# Patient Record
Sex: Male | Born: 1945
Health system: Southern US, Community
[De-identification: ages and names within clinical notes are randomized; demographics above are authoritative.]

## PROBLEM LIST (undated history)

## (undated) DIAGNOSIS — E559 Vitamin D deficiency, unspecified: Secondary | ICD-10-CM

## (undated) DIAGNOSIS — I152 Hypertension secondary to endocrine disorders: Secondary | ICD-10-CM

## (undated) DIAGNOSIS — N529 Male erectile dysfunction, unspecified: Secondary | ICD-10-CM

## (undated) DIAGNOSIS — E1159 Type 2 diabetes mellitus with other circulatory complications: Secondary | ICD-10-CM

## (undated) DIAGNOSIS — E1169 Type 2 diabetes mellitus with other specified complication: Secondary | ICD-10-CM

## (undated) DIAGNOSIS — G3109 Other frontotemporal dementia: Secondary | ICD-10-CM

## (undated) DIAGNOSIS — I10 Essential (primary) hypertension: Secondary | ICD-10-CM

## (undated) DIAGNOSIS — C801 Malignant (primary) neoplasm, unspecified: Secondary | ICD-10-CM

## (undated) DIAGNOSIS — C679 Malignant neoplasm of bladder, unspecified: Secondary | ICD-10-CM

## (undated) DIAGNOSIS — E876 Hypokalemia: Secondary | ICD-10-CM

## (undated) DIAGNOSIS — E119 Type 2 diabetes mellitus without complications: Secondary | ICD-10-CM

## (undated) DIAGNOSIS — E785 Hyperlipidemia, unspecified: Secondary | ICD-10-CM

## (undated) DIAGNOSIS — F0281 Dementia in other diseases classified elsewhere with behavioral disturbance: Secondary | ICD-10-CM

## (undated) HISTORY — DX: Other frontotemporal dementia: G31.09

## (undated) HISTORY — DX: Type 2 diabetes mellitus without complications: E11.9

## (undated) HISTORY — DX: Malignant neoplasm of bladder, unspecified: C67.9

## (undated) HISTORY — DX: Malignant (primary) neoplasm, unspecified: C80.1

## (undated) HISTORY — DX: Hypertension secondary to endocrine disorders: I15.2

## (undated) HISTORY — DX: Dementia in other diseases classified elsewhere with behavioral disturbance: F02.81

## (undated) HISTORY — DX: Male erectile dysfunction, unspecified: N52.9

## (undated) HISTORY — DX: Hypomagnesemia: E83.42

## (undated) HISTORY — DX: Hyperlipidemia, unspecified: E78.5

## (undated) HISTORY — DX: Type 2 diabetes mellitus with other specified complication: E11.69

## (undated) HISTORY — DX: Essential (primary) hypertension: I10

## (undated) HISTORY — DX: Hypokalemia: E87.6

## (undated) HISTORY — DX: Vitamin D deficiency, unspecified: E55.9

## (undated) HISTORY — DX: Type 2 diabetes mellitus with other circulatory complications: E11.59

## (undated) HISTORY — PX: COLONOSCOPY W/ POLYPECTOMY: SHX1380

---

## 2011-03-12 DIAGNOSIS — I10 Essential (primary) hypertension: Secondary | ICD-10-CM

## 2011-03-12 DIAGNOSIS — E119 Type 2 diabetes mellitus without complications: Secondary | ICD-10-CM

## 2011-03-12 DIAGNOSIS — E785 Hyperlipidemia, unspecified: Secondary | ICD-10-CM | POA: Insufficient documentation

## 2012-02-13 DEATH — deceased

## 2013-03-28 ENCOUNTER — Other Ambulatory Visit: Payer: Self-pay | Admitting: Physician Assistant

## 2013-04-05 ENCOUNTER — Other Ambulatory Visit: Payer: Self-pay

## 2013-04-05 MED ORDER — CLONIDINE HCL 0.1 MG PO TABS: 0.1000 mg | ORAL_TABLET | Freq: Every day | ORAL | Status: DC

## 2013-04-05 MED ORDER — GLIPIZIDE 10 MG PO TABS: 10.0000 mg | ORAL_TABLET | Freq: Every day | ORAL | Status: DC

## 2013-04-08 ENCOUNTER — Ambulatory Visit: Payer: Self-pay | Admitting: Family Medicine

## 2013-04-26 ENCOUNTER — Telehealth: Payer: Self-pay | Admitting: Family Medicine

## 2013-04-26 DIAGNOSIS — E119 Type 2 diabetes mellitus without complications: Secondary | ICD-10-CM

## 2013-04-26 MED ORDER — SAXAGLIPTIN HCL 2.5 MG PO TABS: 5.0000 mg | ORAL_TABLET | Freq: Every day | ORAL | Status: DC

## 2013-04-26 NOTE — Telephone Encounter (Signed)
Pt aware samples at front desk. onglyza 5mg  samples given. Pt aware to take 2.5mg 

## 2013-05-05 ENCOUNTER — Other Ambulatory Visit: Payer: Self-pay | Admitting: Nurse Practitioner

## 2013-05-05 ENCOUNTER — Other Ambulatory Visit: Payer: Self-pay | Admitting: Physician Assistant

## 2013-05-11 ENCOUNTER — Telehealth: Payer: Self-pay

## 2013-05-11 NOTE — Telephone Encounter (Signed)
Carvel Huskins called from [employer] [phone] reporting that she went to pt house and his urine screen showed 4+glucose. Dr Modesto Charon aware  APPT MADE FOR June 3 AT 2;20

## 2013-05-12 ENCOUNTER — Ambulatory Visit: Payer: Self-pay | Admitting: Family Medicine

## 2013-05-17 ENCOUNTER — Encounter: Payer: Self-pay | Admitting: Family Medicine

## 2013-05-17 ENCOUNTER — Ambulatory Visit (INDEPENDENT_AMBULATORY_CARE_PROVIDER_SITE_OTHER): Payer: Medicare Other | Admitting: Family Medicine

## 2013-05-17 VITALS — BP 147/93 | HR 93 | Temp 97.9°F | Wt 169.0 lb

## 2013-05-17 DIAGNOSIS — E785 Hyperlipidemia, unspecified: Secondary | ICD-10-CM

## 2013-05-17 DIAGNOSIS — E663 Overweight: Secondary | ICD-10-CM | POA: Insufficient documentation

## 2013-05-17 DIAGNOSIS — I10 Essential (primary) hypertension: Secondary | ICD-10-CM

## 2013-05-17 DIAGNOSIS — E1169 Type 2 diabetes mellitus with other specified complication: Secondary | ICD-10-CM | POA: Insufficient documentation

## 2013-05-17 DIAGNOSIS — Z87440 Personal history of urinary (tract) infections: Secondary | ICD-10-CM | POA: Insufficient documentation

## 2013-05-17 DIAGNOSIS — E559 Vitamin D deficiency, unspecified: Secondary | ICD-10-CM | POA: Insufficient documentation

## 2013-05-17 DIAGNOSIS — E119 Type 2 diabetes mellitus without complications: Secondary | ICD-10-CM

## 2013-05-17 DIAGNOSIS — Z125 Encounter for screening for malignant neoplasm of prostate: Secondary | ICD-10-CM | POA: Insufficient documentation

## 2013-05-17 HISTORY — DX: Type 2 diabetes mellitus with other specified complication: E11.69

## 2013-05-17 HISTORY — DX: Vitamin D deficiency, unspecified: E55.9

## 2013-05-17 LAB — POCT GLYCOSYLATED HEMOGLOBIN (HGB A1C): Hemoglobin A1C: 6.7

## 2013-05-17 LAB — POCT URINALYSIS DIPSTICK
Bilirubin, UA: NEGATIVE
Glucose, UA: NEGATIVE
Ketones, UA: NEGATIVE
Leukocytes, UA: NEGATIVE
Nitrite, UA: NEGATIVE
Spec Grav, UA: 1.015
Urobilinogen, UA: NEGATIVE
pH, UA: 5

## 2013-05-17 LAB — POCT UA - MICROSCOPIC ONLY
Crystals, Ur, HPF, POC: NEGATIVE
Yeast, UA: NEGATIVE

## 2013-05-17 LAB — POCT UA - MICROALBUMIN: Microalbumin Ur, POC: POSITIVE mg/dL

## 2013-05-17 MED ORDER — HYDROCHLOROTHIAZIDE 25 MG PO TABS: 25.0000 mg | ORAL_TABLET | Freq: Every day | ORAL | Status: DC

## 2013-05-17 NOTE — Patient Instructions (Addendum)
Diabetes and Foot Care Diabetes may cause you to have a poor blood supply (circulation) to your legs and feet. Because of this, the skin may be thinner, break easier, and heal more slowly. You also may have nerve damage in your legs and feet causing decreased feeling. You may not notice minor injuries to your feet that could lead to serious problems or infections. Taking care of your feet is one of the most important things you can do for yourself.  HOME CARE INSTRUCTIONS  Do not go barefoot. Bare feet are easily injured.  Check your feet daily for blisters, cuts, and redness.  Wash your feet with warm water (not hot) and mild soap. Pat your feet and between your toes until completely dry.  Apply a moisturizing lotion that does not contain alcohol or petroleum jelly to the dry skin on your feet and to dry brittle toenails. Do not put it between your toes.  Trim your toenails straight across. Do not dig under them or around the cuticle.  Do not cut corns or calluses, or try to remove them with medicine.  Wear clean cotton socks or stockings every day. Make sure they are not too tight. Do not wear knee high stockings since they may decrease blood flow to your legs.  Wear leather shoes that fit properly and have enough cushioning. To break in new shoes, wear them just a few hours a day to avoid injuring your feet.  Wear shoes at all times, even in the house.  Do not cross your legs. This may decrease the blood flow to your feet.  If you find a minor scrape, cut, or break in the skin on your feet, keep it and the skin around it clean and dry. These areas may be cleansed with mild soap and water. Do not use peroxide, alcohol, iodine or Merthiolate.  When you remove an adhesive bandage, be sure not to harm the skin around it.  If you have a wound, look at it several times a day to make sure it is healing.  Do not use heating pads or hot water bottles. Burns can occur. If you have lost feeling  in your feet or legs, you may not know it is happening until it is too late.  Report any cuts, sores or bruises to your caregiver. Do not wait! SEEK MEDICAL CARE IF:   You have an injury that is not healing or you notice redness, numbness, burning, or tingling.  Your feet always feel cold.  You have pain or cramps in your legs and feet. SEEK IMMEDIATE MEDICAL CARE IF:   There is increasing redness, swelling, or increasing pain in the wound.  There is a red line that goes up your leg.  Pus is coming from a wound.  You develop an unexplained oral temperature above 102 F (38.9 C), or as your caregiver suggests.  You notice a bad smell coming from an ulcer or wound. MAKE SURE YOU:   Understand these instructions.  Will watch your condition.  Will get help right away if you are not doing well or get worse. Document Released: 11/28/2000 Document Revised: 02/23/2012 Document Reviewed: 06/06/2009 ExitCare Patient Information 2014 [employer].        Dr Dawon Troop's Recommendations  Diet and Exercise discussed with patient.  For nutrition information, I recommend books:  1).Eat to Live by Dr Joel Fuhrman. 2).Prevent and Reverse Heart Disease by Dr Caldwell Esselstyn. 3) Dr Neal Barnard's Book: Reversing Diabetes  Exercise   recommendations are:  If unable to walk, then the patient can exercise in a chair 3 times a day. By flapping arms like a bird gently and raising legs outwards to the front.  If ambulatory, the patient can go for walks for 30 minutes 3 times a week. Then increase the intensity and duration as tolerated.  Goal is to try to attain exercise frequency to 5 times a week.  If applicable: Best to perform resistance exercises (machines or weights) 2 days a week and cardio type exercises 3 days per week.  

## 2013-05-17 NOTE — Progress Notes (Signed)
Patient ID: Robert Osborne, male   DOB: 01-12-1946, 67 y.o.   MRN: [id number] SUBJECTIVE: Chief Complaint  Patient presents with  . Follow-up    RECK DIABETES     HPI: Patient is here for follow up of Diabetes Mellitus/hyperlipidemia/hypertension: Symptoms of DM: Denies Nocturia ,Denies Urinary Frequency , denies Blurred vision ,deniesDizziness,denies.Dysuria,denies paresthesias, denies extremity pain or ulcers.Marland Kitchendenies chest pain. has had an annual eye exam. do check the feet. Does not check CBGs. Average CBG:? Denies episodes of hypoglycemia. Does have an emergency hypoglycemic plan. admits toCompliance with medications. Denies Problems with medications.  Breakfast: egg sometimes bacon , oatmeal, 1 cup of coffee Lunch: wife cooks: pinto beans, cabbage Supper: fish fried, sandwiches/bolgna/weiners.  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  Allergies: reviewed/updated in Epic  Medications: reviewed/updated in Epic  Immunizations: reviewed/updated in Epic  ROS: As above in the HPI. All other systems are stable or negative.  OBJECTIVE: APPEARANCE:  Patient in no acute distress.The patient appeared well nourished and normally developed. Acyanotic. Waist:37.5 inches VITAL SIGNS:BP 147/93  Pulse 93  Temp(Src) 97.9 F (36.6 C) (Oral)  Wt 169 lb (76.658 kg) AAF Recheck 138/82  SKIN: warm and  Dry without overt rashes, tattoos and scars  HEAD and Neck: without JVD, Head and scalp: normal Eyes:No scleral icterus. Fundi normal, eye movements normal. Ears: Auricle normal, canal normal, Tympanic membranes normal, insufflation normal. Nose: normal Throat: normal Neck & thyroid: normal  CHEST & LUNGS: Chest wall: normal Lungs: Clear  CVS: Reveals the PMI to be normally located. Regular rhythm, First and Second Heart sounds are normal,  absence of murmurs, rubs or gallops. Peripheral  vasculature: Radial pulses: normal Dorsal pedis pulses: normal Posterior pulses: normal  ABDOMEN:  Appearance: normal Benign,, no organomegaly, no masses, no Abdominal Aortic enlargement. No Guarding , no rebound. No Bruits. Bowel sounds: normal  RECTAL: N/A GU: N/A  EXTREMETIES: nonedematous. Both Femoral and Pedal pulses are normal.  MUSCULOSKELETAL:  Spine: normal Joints: intact  NEUROLOGIC: oriented to time,place and person; nonfocal. Strength is normal Sensory is normal Reflexes are normal Cranial Nerves are normal.  ASSESSMENT: HTN (hypertension) - Plan: COMPLETE METABOLIC PANEL WITH GFR  DM (diabetes mellitus) - Plan: POCT glycosylated hemoglobin (Hb A1C), POCT UA - Microalbumin, COMPLETE METABOLIC PANEL WITH GFR  HLD (hyperlipidemia) - Plan: COMPLETE METABOLIC PANEL WITH GFR, NMR Lipoprofile with Lipids  Overweight  Unspecified vitamin D deficiency - Plan: Vitamin D 25 hydroxy  H/O urinary tract infection - 2013 - Plan: POCT urinalysis dipstick, POCT UA - Microscopic Only, Urine culture  Screening for prostate cancer - Plan: PSA   PLAN: Recommend to lose 5 lbs. Orders Placed This Encounter  Procedures  . Urine culture  . COMPLETE METABOLIC PANEL WITH GFR  . NMR Lipoprofile with Lipids  . Vitamin D 25 hydroxy  . PSA  . POCT urinalysis dipstick  . POCT UA - Microscopic Only  . POCT glycosylated hemoglobin (Hb A1C)  . POCT UA - Microalbumin   Meds ordered this encounter  Medications  . Cholecalciferol (D3-1000) 1000 UNITS tablet    Sig: Take 1,000 Units by mouth.  . hydrochlorothiazide (HYDRODIURIL) 25 MG tablet    Sig: Take 1 tablet (25 mg total) by mouth daily.    Dispense:  30 tablet    Refill:  5        Dr Woodroe Mode Recommendations  Diet and Exercise discussed with patient.  For nutrition information, I recommend books:  1).Eat to Live by Dr Monico Hoar. 2).Prevent and Reverse Heart Disease by Dr Suzzette Righter. 3) Dr Katherina Right Book: Reversing Diabetes  Exercise recommendations are:  If unable to walk, then the patient can exercise in a chair 3 times a day. By flapping arms like a bird gently and raising legs outwards to the front.  If ambulatory, the patient can go for walks for 30 minutes 3 times a week. Then increase the intensity and duration as tolerated.  Goal is to try to attain exercise frequency to 5 times a week.  If applicable: Best to perform resistance exercises (machines or weights) 2 days a week and cardio type exercises 3 days per week.  Return in about 3 months (around 08/17/2013) for Recheck medical problems.  Oreatha Fabry P. Modesto Charon, M.D.

## 2013-05-18 LAB — URINE CULTURE
Colony Count: NO GROWTH
Organism ID, Bacteria: NO GROWTH

## 2013-05-18 LAB — COMPLETE METABOLIC PANEL WITH GFR
ALT: 14 U/L (ref 0–53)
AST: 23 U/L (ref 0–37)
Albumin: 4.2 g/dL (ref 3.5–5.2)
Alkaline Phosphatase: 63 U/L (ref 39–117)
BUN: 15 mg/dL (ref 6–23)
CO2: 31 mEq/L (ref 19–32)
Calcium: 10 mg/dL (ref 8.4–10.5)
Chloride: 100 mEq/L (ref 96–112)
Creat: 1.28 mg/dL (ref 0.50–1.35)
GFR, Est African American: 66 mL/min
GFR, Est Non African American: 57 mL/min — ABNORMAL LOW
Glucose, Bld: 83 mg/dL (ref 70–99)
Potassium: 4 mEq/L (ref 3.5–5.3)
Sodium: 141 mEq/L (ref 135–145)
Total Bilirubin: 1 mg/dL (ref 0.3–1.2)
Total Protein: 7.3 g/dL (ref 6.0–8.3)

## 2013-05-18 LAB — NMR LIPOPROFILE WITH LIPIDS
Cholesterol, Total: 125 mg/dL (ref <200)
HDL Particle Number: 30.4 umol/L — ABNORMAL LOW (ref >=30.5)
HDL Size: 8.5 nm — ABNORMAL LOW (ref >=9.2)
HDL-C: 39 mg/dL — ABNORMAL LOW (ref >=40)
LDL (calc): 61 mg/dL (ref <100)
LDL Particle Number: 939 nmol/L (ref <1000)
LDL Size: 20.1 nm — ABNORMAL LOW (ref >20.5)
LP-IR Score: 67 — ABNORMAL HIGH (ref <=45)
Large HDL-P: 2.2 umol/L — ABNORMAL LOW (ref >=4.8)
Large VLDL-P: 2.9 nmol/L — ABNORMAL HIGH (ref <=2.7)
Small LDL Particle Number: 645 nmol/L — ABNORMAL HIGH (ref <=527)
Triglycerides: 125 mg/dL (ref <150)
VLDL Size: 45.8 nm (ref <=46.6)

## 2013-05-18 LAB — PSA: PSA: 2.25 ng/mL (ref <=4.00)

## 2013-05-18 LAB — VITAMIN D 25 HYDROXY (VIT D DEFICIENCY, FRACTURES): Vit D, 25-Hydroxy: 52 ng/mL (ref 30–89)

## 2013-05-19 ENCOUNTER — Other Ambulatory Visit: Payer: Self-pay | Admitting: Family Medicine

## 2013-05-19 DIAGNOSIS — R319 Hematuria, unspecified: Secondary | ICD-10-CM

## 2013-05-19 DIAGNOSIS — Z77090 Contact with and (suspected) exposure to asbestos: Secondary | ICD-10-CM

## 2013-06-02 ENCOUNTER — Telehealth: Payer: Self-pay | Admitting: Family Medicine

## 2013-06-02 MED ORDER — SAXAGLIPTIN HCL 2.5 MG PO TABS: 2.5000 mg | ORAL_TABLET | Freq: Every day | ORAL | Status: DC

## 2013-06-02 NOTE — Telephone Encounter (Signed)
Wife  aware samples onglyza 2.5 mg at front desk

## 2013-06-10 ENCOUNTER — Ambulatory Visit (INDEPENDENT_AMBULATORY_CARE_PROVIDER_SITE_OTHER): Payer: Medicare Other | Admitting: Urology

## 2013-06-10 DIAGNOSIS — N402 Nodular prostate without lower urinary tract symptoms: Secondary | ICD-10-CM

## 2013-06-10 DIAGNOSIS — R3129 Other microscopic hematuria: Secondary | ICD-10-CM

## 2013-07-01 ENCOUNTER — Ambulatory Visit: Payer: Self-pay | Admitting: Family Medicine

## 2013-07-04 ENCOUNTER — Telehealth: Payer: Self-pay | Admitting: Family Medicine

## 2013-07-04 NOTE — Telephone Encounter (Signed)
Samples up front 

## 2013-07-05 ENCOUNTER — Other Ambulatory Visit: Payer: Self-pay | Admitting: *Deleted

## 2013-07-05 MED ORDER — CLONIDINE HCL 0.1 MG PO TABS: 0.1000 mg | ORAL_TABLET | Freq: Every day | ORAL | Status: DC

## 2013-07-19 ENCOUNTER — Telehealth: Payer: Self-pay | Admitting: Family Medicine

## 2013-07-19 NOTE — Telephone Encounter (Signed)
onglyza samples to front #35 Pt notified

## 2013-07-31 ENCOUNTER — Other Ambulatory Visit: Payer: Self-pay | Admitting: Family Medicine

## 2013-08-01 ENCOUNTER — Other Ambulatory Visit: Payer: Self-pay | Admitting: Family Medicine

## 2013-08-18 ENCOUNTER — Encounter: Payer: Self-pay | Admitting: Family Medicine

## 2013-08-18 ENCOUNTER — Ambulatory Visit (INDEPENDENT_AMBULATORY_CARE_PROVIDER_SITE_OTHER): Payer: Medicare Other | Admitting: Family Medicine

## 2013-08-18 VITALS — BP 149/96 | HR 76 | Temp 97.9°F | Wt 165.2 lb

## 2013-08-18 DIAGNOSIS — E119 Type 2 diabetes mellitus without complications: Secondary | ICD-10-CM | POA: Insufficient documentation

## 2013-08-18 DIAGNOSIS — E663 Overweight: Secondary | ICD-10-CM

## 2013-08-18 DIAGNOSIS — I152 Hypertension secondary to endocrine disorders: Secondary | ICD-10-CM | POA: Insufficient documentation

## 2013-08-18 DIAGNOSIS — C679 Malignant neoplasm of bladder, unspecified: Secondary | ICD-10-CM | POA: Insufficient documentation

## 2013-08-18 DIAGNOSIS — E1159 Type 2 diabetes mellitus with other circulatory complications: Secondary | ICD-10-CM | POA: Insufficient documentation

## 2013-08-18 DIAGNOSIS — R635 Abnormal weight gain: Secondary | ICD-10-CM

## 2013-08-18 DIAGNOSIS — I10 Essential (primary) hypertension: Secondary | ICD-10-CM

## 2013-08-18 DIAGNOSIS — E785 Hyperlipidemia, unspecified: Secondary | ICD-10-CM

## 2013-08-18 DIAGNOSIS — C801 Malignant (primary) neoplasm, unspecified: Secondary | ICD-10-CM | POA: Insufficient documentation

## 2013-08-18 HISTORY — DX: Malignant neoplasm of bladder, unspecified: C67.9

## 2013-08-18 LAB — POCT GLYCOSYLATED HEMOGLOBIN (HGB A1C): Hemoglobin A1C: 6.5

## 2013-08-18 NOTE — Progress Notes (Signed)
Patient ID: Raphel Stickles, male   DOB: [birth year], 67 y.o.   MRN: [id number] SUBJECTIVE: CC: Chief Complaint  Patient presents with  . Follow-up    42month ck up see  report from [facility] report bladder    HPI: Patient is here for follow up of Diabetes Mellitus/HTN/HLD: Symptoms evaluated: Denies Nocturia ,Denies Urinary Frequency , denies Blurred vision ,deniesDizziness,denies.Dysuria,denies paresthesias, denies extremity pain or ulcers.Marland Kitchendenies chest pain. has had an annual eye exam. do check the feet. Does check CBGs. Average YQM:VHQIO'N check regularly Denies episodes of hypoglycemia. Does have an emergency hypoglycemic plan. admits toCompliance with medications. Denies Problems with medications.  Recent referral to urology for hematuria: work up and treatment for bladder cancer. See scanned document from the pathology.  Past Medical History  Diagnosis Date  . Hyperlipidemia   . Diabetes mellitus without complication   . Hypertension   . Cancer     papillary urethral carcinoma,low grade, non-invasive   No past surgical history on file. History   Social History  . Marital Status: Married    Spouse Name: N/A    Number of Children: N/A  . Years of Education: N/A   Occupational History  . Not on file.   Social History Main Topics  . Smoking status: Former Smoker    Quit date: 05/17/1973  . Smokeless tobacco: Not on file  . Alcohol Use: Not on file  . Drug Use: Not on file  . Sexual Activity: Not on file   Other Topics Concern  . Not on file   Social History Narrative  . No narrative on file   No family history on file. Current Outpatient Prescriptions on File Prior to Visit  Medication Sig Dispense Refill  . aspirin 81 MG tablet Take 81 mg by mouth daily.        . Cholecalciferol (D3-1000) 1000 UNITS tablet Take 1,000 Units by mouth.      . cloNIDine (CATAPRES) 0.1 MG tablet Take 1 tablet (0.1 mg total) by mouth daily.  30 tablet  2  . glipiZIDE  (GLUCOTROL) 10 MG tablet TAKE ONE TABLET BY MOUTH ONCE DAILY  30 tablet  0  . hydrochlorothiazide (HYDRODIURIL) 25 MG tablet Take 1 tablet (25 mg total) by mouth daily.  30 tablet  5  . metFORMIN (GLUCOPHAGE) 1000 MG tablet TAKE ONE TABLET BY MOUTH TWICE DAILY  60 tablet  0  . saxagliptin HCl (ONGLYZA) 2.5 MG TABS tablet Take 1 tablet (2.5 mg total) by mouth daily. Take 2.5mg  daily  28 tablet  0  . simvastatin (ZOCOR) 40 MG tablet TAKE ONE TABLET BY MOUTH AT BEDTIME  30 tablet  3   No current facility-administered medications on file prior to visit.   Allergies  Allergen Reactions  . Enalapril    Immunization History  Administered Date(s) Administered  . Influenza Whole 08/15/2010   Prior to Admission medications   Medication Sig Start Date End Date Taking? Authorizing Provider  aspirin 81 MG tablet Take 81 mg by mouth daily.     Yes Historical Provider, MD  Cholecalciferol (D3-1000) 1000 UNITS tablet Take 1,000 Units by mouth.   Yes Historical Provider, MD  cloNIDine (CATAPRES) 0.1 MG tablet Take 1 tablet (0.1 mg total) by mouth daily. 07/05/13  Yes Ileana Ladd, MD  glipiZIDE (GLUCOTROL) 10 MG tablet TAKE ONE TABLET BY MOUTH ONCE DAILY 08/01/13  Yes Ileana Ladd, MD  hydrochlorothiazide (HYDRODIURIL) 25 MG tablet Take 1 tablet (25 mg total) by mouth daily. 05/17/13  Yes Ileana Ladd, MD  metFORMIN (GLUCOPHAGE) 1000 MG tablet TAKE ONE TABLET BY MOUTH TWICE DAILY 08/01/13  Yes Ileana Ladd, MD  saxagliptin HCl (ONGLYZA) 2.5 MG TABS tablet Take 1 tablet (2.5 mg total) by mouth daily. Take 2.5mg  daily 06/02/13  Yes Ileana Ladd, MD  simvastatin (ZOCOR) 40 MG tablet TAKE ONE TABLET BY MOUTH AT BEDTIME 07/31/13  Yes Ileana Ladd, MD    ROS: As above in the HPI. All other systems are stable or negative.  OBJECTIVE: APPEARANCE:  Patient in no acute distress.The patient appeared well nourished and normally developed. Acyanotic. Waist: VITAL SIGNS:BP 149/96  Pulse 76  Temp(Src)  97.9 F (36.6 C) (Oral)  Wt 165 lb 3.2 oz (74.934 kg) AAM  SKIN: warm and  Dry without overt rashes, tattoos and scars  HEAD and Neck: without JVD, Head and scalp: normal Eyes:No scleral icterus. Fundi normal, eye movements normal. Ears: Auricle normal, canal normal, Tympanic membranes normal, insufflation normal. Nose: normal Throat: normal Neck & thyroid: normal  CHEST & LUNGS: Chest wall: normal Lungs: Clear  CVS: Reveals the PMI to be normally located. Regular rhythm, First and Second Heart sounds are normal,  absence of murmurs, rubs or gallops. Peripheral vasculature: Radial pulses: normal Dorsal pedis pulses: normal Posterior pulses: normal  ABDOMEN:  Appearance: normal Benign, no organomegaly, no masses, no Abdominal Aortic enlargement. No Guarding , no rebound. No Bruits. Bowel sounds: normal  RECTAL: N/A GU: N/A  EXTREMETIES: nonedematous.  MUSCULOSKELETAL:  Spine: normal Joints: intact  NEUROLOGIC: oriented to time,place and person; nonfocal. Strength is normal Sensory is normal Reflexes are normal Cranial Nerves are normal.  ASSESSMENT: DM (diabetes mellitus) - Plan: POCT glycosylated hemoglobin (Hb A1C), CMP14+EGFR  HLD (hyperlipidemia) - Plan: CMP14+EGFR, NMR, lipoprofile  HTN (hypertension)  Overweight  Bladder cancer   PLAN: Orders Placed This Encounter  Procedures  . CMP14+EGFR  . NMR, lipoprofile  . POCT glycosylated hemoglobin (Hb A1C)         Dr Woodroe Mode Recommendations  For nutrition information, I recommend books:  1).Eat to Live by Dr Monico Hoar. 2).Prevent and Reverse Heart Disease by Dr Suzzette Righter. 3) Dr Katherina Right Book:  Program to Reverse Diabetes  Exercise recommendations are:  If unable to walk, then the patient can exercise in a chair 3 times a day. By flapping arms like a bird gently and raising legs outwards to the front.  If ambulatory, the patient can go for walks for 30 minutes 3  times a week. Then increase the intensity and duration as tolerated.  Goal is to try to attain exercise frequency to 5 times a week.  If applicable: Best to perform resistance exercises (machines or weights) 2 days a week and cardio type exercises 3 days per week.  Advise reading the books on nutrition recommended on previous visits, as above.  Return in about 3 months (around 11/17/2013) for Recheck medical problems.  Dezmen Alcock P. Modesto Charon, M.D.

## 2013-08-20 LAB — CMP14+EGFR
ALT: 19 IU/L (ref 0–44)
AST: 26 IU/L (ref 0–40)
Albumin/Globulin Ratio: 1.8 (ref 1.1–2.5)
Albumin: 4.9 g/dL — ABNORMAL HIGH (ref 3.6–4.8)
Alkaline Phosphatase: 90 IU/L (ref 39–117)
BUN/Creatinine Ratio: 12 (ref 10–22)
BUN: 15 mg/dL (ref 8–27)
CO2: 25 mmol/L (ref 18–29)
Calcium: 10 mg/dL (ref 8.6–10.2)
Chloride: 95 mmol/L — ABNORMAL LOW (ref 97–108)
Creatinine, Ser: 1.22 mg/dL (ref 0.76–1.27)
GFR calc Af Amer: 69 mL/min/{1.73_m2} (ref >59)
GFR calc non Af Amer: 60 mL/min/{1.73_m2} (ref >59)
Globulin, Total: 2.7 g/dL (ref 1.5–4.5)
Glucose: 106 mg/dL — ABNORMAL HIGH (ref 65–99)
Potassium: 4.4 mmol/L (ref 3.5–5.2)
Sodium: 138 mmol/L (ref 134–144)
Total Bilirubin: 0.6 mg/dL (ref 0.0–1.2)
Total Protein: 7.6 g/dL (ref 6.0–8.5)

## 2013-08-20 LAB — NMR, LIPOPROFILE
Cholesterol: 160 mg/dL (ref <200)
HDL Cholesterol by NMR: 47 mg/dL (ref >=40)
HDL Particle Number: 32.9 umol/L (ref >=30.5)
LDL Particle Number: 1212 nmol/L — ABNORMAL HIGH (ref <1000)
LDL Size: 20.3 nm — ABNORMAL LOW (ref >20.5)
LDLC SERPL CALC-MCNC: 83 mg/dL (ref <100)
LP-IR Score: 73 — ABNORMAL HIGH (ref <=45)
Small LDL Particle Number: 818 nmol/L — ABNORMAL HIGH (ref <=527)
Triglycerides by NMR: 149 mg/dL (ref <150)

## 2013-08-30 ENCOUNTER — Encounter: Payer: Self-pay | Admitting: *Deleted

## 2013-09-02 ENCOUNTER — Other Ambulatory Visit: Payer: Self-pay | Admitting: Family Medicine

## 2013-09-10 ENCOUNTER — Other Ambulatory Visit: Payer: Self-pay | Admitting: Family Medicine

## 2013-10-04 ENCOUNTER — Other Ambulatory Visit: Payer: Self-pay | Admitting: Family Medicine

## 2013-11-15 ENCOUNTER — Other Ambulatory Visit: Payer: Self-pay | Admitting: Family Medicine

## 2013-11-17 ENCOUNTER — Encounter: Payer: Self-pay | Admitting: Family Medicine

## 2013-11-17 ENCOUNTER — Encounter (INDEPENDENT_AMBULATORY_CARE_PROVIDER_SITE_OTHER): Payer: Self-pay

## 2013-11-17 ENCOUNTER — Ambulatory Visit (INDEPENDENT_AMBULATORY_CARE_PROVIDER_SITE_OTHER): Payer: Medicare Other | Admitting: Family Medicine

## 2013-11-17 VITALS — BP 150/85 | HR 90 | Temp 98.1°F | Ht 67.0 in | Wt 162.0 lb

## 2013-11-17 DIAGNOSIS — E559 Vitamin D deficiency, unspecified: Secondary | ICD-10-CM

## 2013-11-17 DIAGNOSIS — E119 Type 2 diabetes mellitus without complications: Secondary | ICD-10-CM

## 2013-11-17 DIAGNOSIS — Z23 Encounter for immunization: Secondary | ICD-10-CM

## 2013-11-17 DIAGNOSIS — E663 Overweight: Secondary | ICD-10-CM

## 2013-11-17 DIAGNOSIS — I10 Essential (primary) hypertension: Secondary | ICD-10-CM

## 2013-11-17 DIAGNOSIS — R635 Abnormal weight gain: Secondary | ICD-10-CM

## 2013-11-17 DIAGNOSIS — C679 Malignant neoplasm of bladder, unspecified: Secondary | ICD-10-CM

## 2013-11-17 DIAGNOSIS — E785 Hyperlipidemia, unspecified: Secondary | ICD-10-CM

## 2013-11-17 MED ORDER — PNEUMOCOCCAL 13-VAL CONJ VACC IM SUSP: 0.5000 mL | Freq: Once | INTRAMUSCULAR | Status: DC

## 2013-11-17 MED ORDER — HYDROCHLOROTHIAZIDE 25 MG PO TABS: ORAL_TABLET | ORAL | Status: DC

## 2013-11-17 NOTE — Progress Notes (Signed)
Patient ID: Robert Osborne, male   DOB: 1946/03/19, 67 y.o.   MRN: [id number] SUBJECTIVE: CC: Chief Complaint  Patient presents with  . Follow-up    3 month follow up    HPI:   Patient is here for follow up of Diabetes Mellitus/HTN/HLD: Symptoms evaluated: Denies Nocturia ,Denies Urinary Frequency , denies Blurred vision ,deniesDizziness,denies.Dysuria,denies paresthesias, denies extremity pain or ulcers.Marland Kitchendenies chest pain. has had an annual eye exam. do check the feet. Does check CBGs. Average WUJ:WJXBJ checked it in a while machine not working Denies episodes of hypoglycemia. Does have an emergency hypoglycemic plan. admits toCompliance with medications. Denies Problems with medications.  Past Medical History  Diagnosis Date  . Hyperlipidemia   . Diabetes mellitus without complication   . Hypertension   . Cancer     papillary urethral carcinoma,low grade, non-invasive   No past surgical history on file. History   Social History  . Marital Status: Married    Spouse Name: N/A    Number of Children: N/A  . Years of Education: N/A   Occupational History  . Not on file.   Social History Main Topics  . Smoking status: Former Smoker    Quit date: 05/17/1973  . Smokeless tobacco: Not on file  . Alcohol Use: Not on file  . Drug Use: Not on file  . Sexual Activity: Not on file   Other Topics Concern  . Not on file   Social History Narrative  . No narrative on file   No family history on file. Current Outpatient Prescriptions on File Prior to Visit  Medication Sig Dispense Refill  . aspirin 81 MG tablet Take 81 mg by mouth daily.        . Cholecalciferol (D3-1000) 1000 UNITS tablet Take 1,000 Units by mouth.      . cloNIDine (CATAPRES) 0.1 MG tablet TAKE ONE TABLET BY MOUTH ONCE DAILY  30 tablet  1  . glipiZIDE (GLUCOTROL) 10 MG tablet TAKE ONE TABLET BY MOUTH ONCE DAILY  30 tablet  2  . metFORMIN (GLUCOPHAGE) 1000 MG tablet TAKE ONE TABLET BY MOUTH TWICE DAILY  60  tablet  2  . saxagliptin HCl (ONGLYZA) 2.5 MG TABS tablet Take 1 tablet (2.5 mg total) by mouth daily. Take 2.5mg  daily  28 tablet  0  . simvastatin (ZOCOR) 40 MG tablet TAKE ONE TABLET BY MOUTH AT BEDTIME  30 tablet  3   No current facility-administered medications on file prior to visit.   Allergies  Allergen Reactions  . Enalapril    Immunization History  Administered Date(s) Administered  . Influenza Whole 08/15/2010  . Influenza-Unspecified 11/17/2013  . Pneumococcal Conjugate-13 11/17/2013   Prior to Admission medications   Medication Sig Start Date End Date Taking? Authorizing Provider  aspirin 81 MG tablet Take 81 mg by mouth daily.     Yes Historical Provider, MD  Cholecalciferol (D3-1000) 1000 UNITS tablet Take 1,000 Units by mouth.   Yes Historical Provider, MD  cloNIDine (CATAPRES) 0.1 MG tablet TAKE ONE TABLET BY MOUTH ONCE DAILY 10/04/13  Yes Ileana Ladd, MD  glipiZIDE (GLUCOTROL) 10 MG tablet TAKE ONE TABLET BY MOUTH ONCE DAILY 09/02/13  Yes Ileana Ladd, MD  hydrochlorothiazide (HYDRODIURIL) 25 MG tablet TAKE ONE TABLET BY MOUTH ONCE DAILY 11/15/13  Yes Ileana Ladd, MD  metFORMIN (GLUCOPHAGE) 1000 MG tablet TAKE ONE TABLET BY MOUTH TWICE DAILY 09/10/13  Yes Ernestina Penna, MD  saxagliptin HCl (ONGLYZA) 2.5 MG TABS tablet Take 1 tablet (  2.5 mg total) by mouth daily. Take 2.5mg  daily 06/02/13  Yes Ileana Ladd, MD  simvastatin (ZOCOR) 40 MG tablet TAKE ONE TABLET BY MOUTH AT BEDTIME 07/31/13  Yes Ileana Ladd, MD     ROS: As above in the HPI. All other systems are stable or negative.  OBJECTIVE: APPEARANCE:  Patient in no acute distress.The patient appeared well nourished and normally developed. Acyanotic. Waist: VITAL SIGNS:BP 150/85  Pulse 90  Temp(Src) 98.1 F (36.7 C) (Oral)  Ht 5\' 7"  (1.702 m)  Wt 162 lb (73.483 kg)  BMI 25.37 kg/m2   SKIN: warm and  Dry without overt rashes, tattoos and scars  HEAD and Neck: without JVD, Head and scalp:  normal Eyes:No scleral icterus. Fundi normal, eye movements normal. Ears: Auricle normal, canal normal, Tympanic membranes normal, insufflation normal. Nose: normal Throat: normal Neck & thyroid: normal  CHEST & LUNGS: Chest wall: normal Lungs: Clear  CVS: Reveals the PMI to be normally located. Regular rhythm, First and Second Heart sounds are normal,  absence of murmurs, rubs or gallops. Peripheral vasculature: Radial pulses: normal Dorsal pedis pulses: normal Posterior pulses: normal  ABDOMEN:  Appearance: normal Benign, no organomegaly, no masses, no Abdominal Aortic enlargement. No Guarding , no rebound. No Bruits. Bowel sounds: normal  RECTAL: N/A GU: N/A  EXTREMETIES: nonedematous.  MUSCULOSKELETAL:  Spine: normal Joints: intact  NEUROLOGIC: oriented to time,place and person; nonfocal. Strength is normal Sensory is normal Reflexes are normal Cranial Nerves are normal.  ASSESSMENT:  Unspecified vitamin D deficiency  Overweight  HTN (hypertension) - Plan: CMP14+EGFR  HLD (hyperlipidemia) - Plan: CMP14+EGFR, NMR, lipoprofile  Diabetes mellitus without complication - Plan: POCT glycosylated hemoglobin (Hb A1C)  Bladder cancer  Need for pneumococcal vaccination - Plan: pneumococcal 13-valent conjugate vaccine (PREVNAR 13) injection 0.5 mL  PLAN:        Dr Woodroe Mode Recommendations  For nutrition information, I recommend books:  1).Eat to Live by Dr Monico Hoar. 2).Prevent and Reverse Heart Disease by Dr Suzzette Righter. 3) Dr Katherina Right Book:  Program to Reverse Diabetes  Exercise recommendations are:  If unable to walk, then the patient can exercise in a chair 3 times a day. By flapping arms like a bird gently and raising legs outwards to the front.  If ambulatory, the patient can go for walks for 30 minutes 3 times a week. Then increase the intensity and duration as tolerated.  Goal is to try to attain exercise frequency to 5  times a week.  If applicable: Best to perform resistance exercises (machines or weights) 2 days a week and cardio type exercises 3 days per week.   Discussed DM foot care and handout in AVS  Orders Placed This Encounter  Procedures  . CMP14+EGFR  . NMR, lipoprofile  . POCT glycosylated hemoglobin (Hb A1C)   Meds ordered this encounter  Medications  . hydrochlorothiazide (HYDRODIURIL) 25 MG tablet    Sig: TAKE ONE TABLET BY MOUTH ONCE DAILY    Dispense:  30 tablet    Refill:  11  . pneumococcal 13-valent conjugate vaccine (PREVNAR 13) injection 0.5 mL    Sig:    Medications Discontinued During This Encounter  Medication Reason  . hydrochlorothiazide (HYDRODIURIL) 25 MG tablet Reorder   Return in about 3 months (around 02/15/2014) for Recheck medical problems.  Nessa Ramaker P. Modesto Charon, M.D.

## 2013-11-17 NOTE — Progress Notes (Signed)
Tolerated prevnar injection with out difficulty 

## 2013-11-17 NOTE — Patient Instructions (Addendum)
    Dr Tamu Golz's Recommendations  For nutrition information, I recommend books:  1).Eat to Live by Dr Joel Fuhrman. 2).Prevent and Reverse Heart Disease by Dr Caldwell Esselstyn. 3) Dr Neal Barnard's Book:  Program to Reverse Diabetes  Exercise recommendations are:  If unable to walk, then the patient can exercise in a chair 3 times a day. By flapping arms like a bird gently and raising legs outwards to the front.  If ambulatory, the patient can go for walks for 30 minutes 3 times a week. Then increase the intensity and duration as tolerated.  Goal is to try to attain exercise frequency to 5 times a week.  If applicable: Best to perform resistance exercises (machines or weights) 2 days a week and cardio type exercises 3 days per week.   Diabetes and Foot Care Diabetes may cause you to have problems because of poor blood supply (circulation) to your feet and legs. This may cause the skin on your feet to become thinner, break easier, and heal more slowly. Your skin may become dry, and the skin may peel and crack. You may also have nerve damage in your legs and feet causing decreased feeling in them. You may not notice minor injuries to your feet that could lead to infections or more serious problems. Taking care of your feet is one of the most important things you can do for yourself.  HOME CARE INSTRUCTIONS  Wear shoes at all times, even in the house. Do not go barefoot. Bare feet are easily injured.  Check your feet daily for blisters, cuts, and redness. If you cannot see the bottom of your feet, use a mirror or ask someone for help.  Wash your feet with warm water (do not use hot water) and mild soap. Then pat your feet and the areas between your toes until they are completely dry. Do not soak your feet as this can dry your skin.  Apply a moisturizing lotion or petroleum jelly (that does not contain alcohol and is unscented) to the skin on your feet and to dry, brittle toenails.  Do not apply lotion between your toes.  Trim your toenails straight across. Do not dig under them or around the cuticle. File the edges of your nails with an emery board or nail file.  Do not cut corns or calluses or try to remove them with medicine.  Wear clean socks or stockings every day. Make sure they are not too tight. Do not wear knee-high stockings since they may decrease blood flow to your legs.  Wear shoes that fit properly and have enough cushioning. To break in new shoes, wear them for just a few hours a day. This prevents you from injuring your feet. Always look in your shoes before you put them on to be sure there are no objects inside.  Do not cross your legs. This may decrease the blood flow to your feet.  If you find a minor scrape, cut, or break in the skin on your feet, keep it and the skin around it clean and dry. These areas may be cleansed with mild soap and water. Do not cleanse the area with peroxide, alcohol, or iodine.  When you remove an adhesive bandage, be sure not to damage the skin around it.  If you have a wound, look at it several times a day to make sure it is healing.  Do not use heating pads or hot water bottles. They may burn your skin. If   you have lost feeling in your feet or legs, you may not know it is happening until it is too late.  Make sure your health care provider performs a complete foot exam at least annually or more often if you have foot problems. Report any cuts, sores, or bruises to your health care provider immediately. SEEK MEDICAL CARE IF:   You have an injury that is not healing.  You have cuts or breaks in the skin.  You have an ingrown nail.  You notice redness on your legs or feet.  You feel burning or tingling in your legs or feet.  You have pain or cramps in your legs and feet.  Your legs or feet are numb.  Your feet always feel cold. SEEK IMMEDIATE MEDICAL CARE IF:   There is increasing redness, swelling, or pain in  or around a wound.  There is a red line that goes up your leg.  Pus is coming from a wound.  You develop a fever or as directed by your health care provider.  You notice a bad smell coming from an ulcer or wound. Document Released: 11/28/2000 Document Revised: 08/03/2013 Document Reviewed: 05/10/2013 [employer] Patient Information 2014 [employer]. Pneumococcal Vaccine, Polyvalent suspension for injection What is this medicine? PNEUMOCOCCAL VACCINE, POLYVALENT (NEU mo KOK al vak SEEN, pol ee VEY luhnt) is a vaccine to prevent pneumococcus bacteria infection. These bacteria are a major cause of ear infections, 'Strep throat' infections, and serious pneumonia, meningitis, or blood infections worldwide. These vaccines help the body to produce antibodies (protective substances) that help your body defend against these bacteria. This vaccine is recommended for infants and young children. This vaccine will not treat an infection. This medicine may be used for other purposes; ask your health care provider or pharmacist if you have questions. COMMON BRAND NAME(S): Prevnar 13 , Prevnar What should I tell my health care provider before I take this medicine? They need to know if you have any of these conditions: -bleeding problems -fever -immune system problems -low platelet count in the blood -seizures -an unusual or allergic reaction to pneumococcal vaccine, diphtheria toxoid, other vaccines, latex, other medicines, foods, dyes, or preservatives -pregnant or trying to get pregnant -breast-feeding How should I use this medicine? This vaccine is for injection into a muscle. It is given by a health care professional. A copy of Vaccine Information Statements will be given before each vaccination. Read this sheet carefully each time. The sheet may change frequently. Talk to your pediatrician regarding the use of this medicine in children. While this drug may be prescribed for children as young as 6  weeks old for selected conditions, precautions do apply. Overdosage: If you think you have taken too much of this medicine contact a poison control center or emergency room at once. NOTE: This medicine is only for you. Do not share this medicine with others. What if I miss a dose? It is important not to miss your dose. Call your doctor or health care professional if you are unable to keep an appointment. What may interact with this medicine? -medicines for cancer chemotherapy -medicines that suppress your immune function -medicines that treat or prevent blood clots like warfarin, enoxaparin, and dalteparin -steroid medicines like prednisone or cortisone This list may not describe all possible interactions. Give your health care provider a list of all the medicines, herbs, non-prescription drugs, or dietary supplements you use. Also tell them if you smoke, drink alcohol, or use illegal drugs. Some items may   interact with your medicine. What should I watch for while using this medicine? Mild fever and pain should go away in 3 days or less. Report any unusual symptoms to your doctor or health care professional. What side effects may I notice from receiving this medicine? Side effects that you should report to your doctor or health care professional as soon as possible: -allergic reactions like skin rash, itching or hives, swelling of the face, lips, or tongue -breathing problems -confused -fever over 102 degrees F -pain, tingling, numbness in the hands or feet -seizures -unusual bleeding or bruising -unusual muscle weakness Side effects that usually do not require medical attention (report to your doctor or health care professional if they continue or are bothersome): -aches and pains -diarrhea -fever of 102 degrees F or less -headache -irritable -loss of appetite -pain, tender at site where injected -trouble sleeping This list may not describe all possible side effects. Call your doctor  for medical advice about side effects. You may report side effects to FDA at 1-800-FDA-1088. Where should I keep my medicine? This does not apply. This vaccine is given in a clinic, pharmacy, doctor's office, or other health care setting and will not be stored at home. NOTE: This sheet is a summary. It may not cover all possible information. If you have questions about this medicine, talk to your doctor, pharmacist, or health care provider.  2014, [employer]vier/Gold Standard. (2009-02-13 10:17:22)  

## 2013-11-21 ENCOUNTER — Other Ambulatory Visit: Payer: Medicare Other

## 2013-11-21 LAB — POCT GLYCOSYLATED HEMOGLOBIN (HGB A1C): Hemoglobin A1C: 6.9

## 2013-11-21 NOTE — Progress Notes (Signed)
Patient came in for labs only.

## 2013-11-23 LAB — NMR, LIPOPROFILE
Cholesterol: 145 mg/dL (ref <200)
HDL Cholesterol by NMR: 47 mg/dL (ref >=40)
HDL Particle Number: 33.7 umol/L (ref >=30.5)
LDL Particle Number: 1228 nmol/L — ABNORMAL HIGH (ref <1000)
LDL Size: 20.3 nm — ABNORMAL LOW (ref >20.5)
LDLC SERPL CALC-MCNC: 75 mg/dL (ref <100)
LP-IR Score: 58 — ABNORMAL HIGH (ref <=45)
Small LDL Particle Number: 764 nmol/L — ABNORMAL HIGH (ref <=527)
Triglycerides by NMR: 117 mg/dL (ref <150)

## 2013-11-23 LAB — CMP14+EGFR
ALT: 14 IU/L (ref 0–44)
AST: 22 IU/L (ref 0–40)
Albumin/Globulin Ratio: 1.7 (ref 1.1–2.5)
Albumin: 4.3 g/dL (ref 3.6–4.8)
Alkaline Phosphatase: 71 IU/L (ref 39–117)
BUN/Creatinine Ratio: 11 (ref 10–22)
BUN: 13 mg/dL (ref 8–27)
CO2: 25 mmol/L (ref 18–29)
Calcium: 9.5 mg/dL (ref 8.6–10.2)
Chloride: 97 mmol/L (ref 97–108)
Creatinine, Ser: 1.18 mg/dL (ref 0.76–1.27)
GFR calc Af Amer: 72 mL/min/{1.73_m2} (ref >59)
GFR calc non Af Amer: 63 mL/min/{1.73_m2} (ref >59)
Globulin, Total: 2.6 g/dL (ref 1.5–4.5)
Glucose: 149 mg/dL — ABNORMAL HIGH (ref 65–99)
Potassium: 4.4 mmol/L (ref 3.5–5.2)
Sodium: 140 mmol/L (ref 134–144)
Total Bilirubin: 0.7 mg/dL (ref 0.0–1.2)
Total Protein: 6.9 g/dL (ref 6.0–8.5)

## 2013-12-01 ENCOUNTER — Other Ambulatory Visit: Payer: Self-pay | Admitting: Family Medicine

## 2013-12-04 ENCOUNTER — Other Ambulatory Visit: Payer: Self-pay | Admitting: Family Medicine

## 2014-01-05 ENCOUNTER — Other Ambulatory Visit: Payer: Self-pay | Admitting: Family Medicine

## 2014-01-16 ENCOUNTER — Telehealth: Payer: Self-pay | Admitting: Family Medicine

## 2014-01-16 MED ORDER — SAXAGLIPTIN HCL 2.5 MG PO TABS: 2.5000 mg | ORAL_TABLET | Freq: Every day | ORAL | Status: DC

## 2014-01-16 NOTE — Telephone Encounter (Signed)
PT AWARE TO PICK UP SAMPLES OF ONGLYZA. ALSO PT AWARE SAMPLES WERE 5MG  AND KNOW TO ONLY TAKE 1/2 TABLET

## 2014-02-21 ENCOUNTER — Encounter (INDEPENDENT_AMBULATORY_CARE_PROVIDER_SITE_OTHER): Payer: Self-pay

## 2014-02-21 ENCOUNTER — Encounter: Payer: Self-pay | Admitting: Family Medicine

## 2014-02-21 ENCOUNTER — Ambulatory Visit (INDEPENDENT_AMBULATORY_CARE_PROVIDER_SITE_OTHER): Payer: Medicare Other | Admitting: Family Medicine

## 2014-02-21 VITALS — BP 142/89 | HR 66 | Temp 98.3°F | Ht 67.0 in | Wt 168.2 lb

## 2014-02-21 DIAGNOSIS — E663 Overweight: Secondary | ICD-10-CM

## 2014-02-21 DIAGNOSIS — E119 Type 2 diabetes mellitus without complications: Secondary | ICD-10-CM

## 2014-02-21 DIAGNOSIS — C679 Malignant neoplasm of bladder, unspecified: Secondary | ICD-10-CM

## 2014-02-21 DIAGNOSIS — E559 Vitamin D deficiency, unspecified: Secondary | ICD-10-CM

## 2014-02-21 DIAGNOSIS — E785 Hyperlipidemia, unspecified: Secondary | ICD-10-CM

## 2014-02-21 DIAGNOSIS — I10 Essential (primary) hypertension: Secondary | ICD-10-CM

## 2014-02-21 LAB — POCT GLYCOSYLATED HEMOGLOBIN (HGB A1C): Hemoglobin A1C: 7.1

## 2014-02-21 NOTE — Patient Instructions (Signed)
Diabetes and Foot Care Diabetes may cause you to have problems because of poor blood supply (circulation) to your feet and legs. This may cause the skin on your feet to become thinner, break easier, and heal more slowly. Your skin may become dry, and the skin may peel and crack. You may also have nerve damage in your legs and feet causing decreased feeling in them. You may not notice minor injuries to your feet that could lead to infections or more serious problems. Taking care of your feet is one of the most important things you can do for yourself.  HOME CARE INSTRUCTIONS  Wear shoes at all times, even in the house. Do not go barefoot. Bare feet are easily injured.  Check your feet daily for blisters, cuts, and redness. If you cannot see the bottom of your feet, use a mirror or ask someone for help.  Wash your feet with warm water (do not use hot water) and mild soap. Then pat your feet and the areas between your toes until they are completely dry. Do not soak your feet as this can dry your skin.  Apply a moisturizing lotion or petroleum jelly (that does not contain alcohol and is unscented) to the skin on your feet and to dry, brittle toenails. Do not apply lotion between your toes.  Trim your toenails straight across. Do not dig under them or around the cuticle. File the edges of your nails with an emery board or nail file.  Do not cut corns or calluses or try to remove them with medicine.  Wear clean socks or stockings every day. Make sure they are not too tight. Do not wear knee-high stockings since they may decrease blood flow to your legs.  Wear shoes that fit properly and have enough cushioning. To break in new shoes, wear them for just a few hours a day. This prevents you from injuring your feet. Always look in your shoes before you put them on to be sure there are no objects inside.  Do not cross your legs. This may decrease the blood flow to your feet.  If you find a minor scrape,  cut, or break in the skin on your feet, keep it and the skin around it clean and dry. These areas may be cleansed with mild soap and water. Do not cleanse the area with peroxide, alcohol, or iodine.  When you remove an adhesive bandage, be sure not to damage the skin around it.  If you have a wound, look at it several times a day to make sure it is healing.  Do not use heating pads or hot water bottles. They may burn your skin. If you have lost feeling in your feet or legs, you may not know it is happening until it is too late.  Make sure your health care provider performs a complete foot exam at least annually or more often if you have foot problems. Report any cuts, sores, or bruises to your health care provider immediately. SEEK MEDICAL CARE IF:   You have an injury that is not healing.  You have cuts or breaks in the skin.  You have an ingrown nail.  You notice redness on your legs or feet.  You feel burning or tingling in your legs or feet.  You have pain or cramps in your legs and feet.  Your legs or feet are numb.  Your feet always feel cold. SEEK IMMEDIATE MEDICAL CARE IF:   There is increasing redness,   swelling, or pain in or around a wound.  There is a red line that goes up your leg.  Pus is coming from a wound.  You develop a fever or as directed by your health care provider.  You notice a bad smell coming from an ulcer or wound. Document Released: 11/28/2000 Document Revised: 08/03/2013 Document Reviewed: 05/10/2013 [facility] Patient Information 2014 [place].        Dr Paula Libra Recommendations  For nutrition information, I recommend books:  1).Eat to Live by Dr Excell Seltzer. 2).Prevent and Reverse Heart Disease by Dr Karl Luke. 3) Dr Janene Harvey Book:  Program to Reverse Diabetes  Exercise recommendations are:  If unable to walk, then the patient can exercise in a chair 3 times a day. By flapping arms like a bird gently and  raising legs outwards to the front.  If ambulatory, the patient can go for walks for 30 minutes 3 times a week. Then increase the intensity and duration as tolerated.  Goal is to try to attain exercise frequency to 5 times a week.  If applicable: Best to perform resistance exercises (machines or weights) 2 days a week and cardio type exercises 3 days per week.

## 2014-02-21 NOTE — Progress Notes (Signed)
Patient ID: Robert Osborne, male   DOB: 1946/02/16, 68 y.o.   MRN: [id number] SUBJECTIVE: CC: Chief Complaint  Patient presents with  . Follow-up    3 MONTH CHRONIC PROBLEMS C/O COLD SYMPTOMS     HPI: Patient is here for follow up of Diabetes Mellitus/HLD/HTN: Symptoms evaluated: Denies Nocturia ,Denies Urinary Frequency , denies Blurred vision ,deniesDizziness,denies.Dysuria,denies paresthesias, denies extremity pain or ulcers.Marland Kitchendenies chest pain. has had an annual eye exam. do check the feet. Does check CBGs. Average CBG:not been checking Denies episodes of hypoglycemia. Does have an emergency hypoglycemic plan. admits toCompliance with medications. Denies Problems with medications.  Past Medical History  Diagnosis Date  . Hyperlipidemia   . Diabetes mellitus without complication   . Hypertension   . Cancer     papillary urethral carcinoma,low grade, non-invasive   No past surgical history on file. History   Social History  . Marital Status: Married    Spouse Name: N/A    Number of Children: N/A  . Years of Education: N/A   Occupational History  . Not on file.   Social History Main Topics  . Smoking status: Former Smoker    Quit date: 05/17/1973  . Smokeless tobacco: Not on file  . Alcohol Use: Not on file  . Drug Use: Not on file  . Sexual Activity: Not on file   Other Topics Concern  . Not on file   Social History Narrative  . No narrative on file   No family history on file. Current Outpatient Prescriptions on File Prior to Visit  Medication Sig Dispense Refill  . aspirin 81 MG tablet Take 81 mg by mouth daily.        . Cholecalciferol (D3-1000) 1000 UNITS tablet Take 1,000 Units by mouth.      . cloNIDine (CATAPRES) 0.1 MG tablet TAKE ONE TABLET BY MOUTH ONCE DAILY  30 tablet  5  . glipiZIDE (GLUCOTROL) 10 MG tablet TAKE ONE TABLET BY MOUTH ONCE DAILY  30 tablet  2  . hydrochlorothiazide (HYDRODIURIL) 25 MG tablet TAKE ONE TABLET BY MOUTH ONCE DAILY  30  tablet  11  . metFORMIN (GLUCOPHAGE) 1000 MG tablet TAKE ONE TABLET BY MOUTH TWICE DAILY  60 tablet  2  . simvastatin (ZOCOR) 40 MG tablet TAKE ONE TABLET BY MOUTH ONCE DAILY AT BEDTIME  30 tablet  5   Current Facility-Administered Medications on File Prior to Visit  Medication Dose Route Frequency Provider Last Rate Last Dose  . pneumococcal 13-valent conjugate vaccine (PREVNAR 13) injection 0.5 mL  0.5 mL Intramuscular Once Ileana Ladd, MD       Allergies  Allergen Reactions  . Enalapril    Immunization History  Administered Date(s) Administered  . Influenza Whole 08/15/2010  . Influenza-Unspecified 11/17/2013  . Pneumococcal Conjugate-13 11/17/2013   Prior to Admission medications   Medication Sig Start Date End Date Taking? Authorizing Provider  saxagliptin HCl (ONGLYZA) 5 MG TABS tablet Take 5 mg by mouth daily.   Yes Historical Provider, MD  aspirin 81 MG tablet Take 81 mg by mouth daily.      Historical Provider, MD  Cholecalciferol (D3-1000) 1000 UNITS tablet Take 1,000 Units by mouth.    Historical Provider, MD  cloNIDine (CATAPRES) 0.1 MG tablet TAKE ONE TABLET BY MOUTH ONCE DAILY 12/04/13   Ileana Ladd, MD  glipiZIDE (GLUCOTROL) 10 MG tablet TAKE ONE TABLET BY MOUTH ONCE DAILY 01/05/14   Ileana Ladd, MD  hydrochlorothiazide (HYDRODIURIL) 25 MG tablet TAKE  ONE TABLET BY MOUTH ONCE DAILY 11/17/13   Vernie Shanks, MD  metFORMIN (GLUCOPHAGE) 1000 MG tablet TAKE ONE TABLET BY MOUTH TWICE DAILY 12/04/13   Vernie Shanks, MD  simvastatin (ZOCOR) 40 MG tablet TAKE ONE TABLET BY MOUTH ONCE DAILY AT BEDTIME 12/01/13   Vernie Shanks, MD     ROS: As above in the HPI. All other systems are stable or negative.  OBJECTIVE: APPEARANCE:  Patient in no acute distress.The patient appeared well nourished and normally developed. Acyanotic. Waist: VITAL SIGNS:BP 142/89  Pulse 66  Temp(Src) 98.3 F (36.8 C) (Oral)  Ht $R'5\' 7"'mk$  (1.702 m)  Wt 168 lb 3.2 oz (76.295 kg)  BMI 26.34  kg/m2 AAM  SKIN: warm and  Dry without overt rashes, tattoos and scars  HEAD and Neck: without JVD, Head and scalp: normal Eyes:No scleral icterus. Fundi normal, eye movements normal. Ears: Auricle normal, canal normal, Tympanic membranes normal, insufflation normal. Nose: normal Throat: normal Neck & thyroid: normal  CHEST & LUNGS: Chest wall: normal Lungs: Clear  CVS: Reveals the PMI to be normally located. Regular rhythm, First and Second Heart sounds are normal,  absence of murmurs, rubs or gallops. Peripheral vasculature: Radial pulses: normal Dorsal pedis pulses: normal Posterior pulses: normal  ABDOMEN:  Appearance: normal Benign, no organomegaly, no masses, no Abdominal Aortic enlargement. No Guarding , no rebound. No Bruits. Bowel sounds: normal  RECTAL: N/A GU: N/A  EXTREMETIES: nonedematous.  MUSCULOSKELETAL:  Spine: normal Joints: intact  NEUROLOGIC: oriented to time,place and person; nonfocal. Strength is normal Sensory is normal Reflexes are normal Cranial Nerves are normal.  ASSESSMENT:  Overweight  HTN (hypertension) - Plan: CMP14+EGFR  HLD (hyperlipidemia) - Plan: CMP14+EGFR, NMR, lipoprofile  Diabetes mellitus without complication - Plan: POCT glycosylated hemoglobin (Hb A1C), CMP14+EGFR  Bladder cancer  Unspecified vitamin D deficiency - Plan: Vit D  25 hydroxy (rtn osteoporosis monitoring)  PLAN:      Dr Paula Libra Recommendations  For nutrition information, I recommend books:  1).Eat to Live by Dr Excell Seltzer. 2).Prevent and Reverse Heart Disease by Dr Karl Luke. 3) Dr Janene Harvey Book:  Program to Reverse Diabetes  Exercise recommendations are:  If unable to walk, then the patient can exercise in a chair 3 times a day. By flapping arms like a bird gently and raising legs outwards to the front.  If ambulatory, the patient can go for walks for 30 minutes 3 times a week. Then increase the intensity and  duration as tolerated.  Goal is to try to attain exercise frequency to 5 times a week.  If applicable: Best to perform resistance exercises (machines or weights) 2 days a week and cardio type exercises 3 days per week.  DM footcare.  Continue present medication regimen. Orders Placed This Encounter  Procedures  . CMP14+EGFR  . NMR, lipoprofile  . Vit D  25 hydroxy (rtn osteoporosis monitoring)  . POCT glycosylated hemoglobin (Hb A1C)   Meds ordered this encounter  Medications  . saxagliptin HCl (ONGLYZA) 5 MG TABS tablet    Sig: Take 5 mg by mouth daily.   Medications Discontinued During This Encounter  Medication Reason  . saxagliptin HCl (ONGLYZA) 2.5 MG TABS tablet Change in therapy   Return in about 3 months (around 05/24/2014) for Recheck medical problems.  Mohamed Portlock P. Jacelyn Grip, M.D.

## 2014-02-23 LAB — CMP14+EGFR
ALT: 15 IU/L (ref 0–44)
AST: 23 IU/L (ref 0–40)
Albumin/Globulin Ratio: 1.5 (ref 1.1–2.5)
Albumin: 4 g/dL (ref 3.5–4.8)
Alkaline Phosphatase: 69 IU/L (ref 39–117)
BUN/Creatinine Ratio: 11 (ref 10–22)
BUN: 13 mg/dL (ref 8–27)
CO2: 23 mmol/L (ref 18–29)
Calcium: 9.3 mg/dL (ref 8.6–10.2)
Chloride: 101 mmol/L (ref 97–108)
Creatinine, Ser: 1.17 mg/dL (ref 0.76–1.27)
GFR calc Af Amer: 73 mL/min/{1.73_m2} (ref >59)
GFR calc non Af Amer: 63 mL/min/{1.73_m2} (ref >59)
Globulin, Total: 2.6 g/dL (ref 1.5–4.5)
Glucose: 77 mg/dL (ref 65–99)
Potassium: 3.6 mmol/L (ref 3.5–5.2)
Sodium: 144 mmol/L (ref 134–144)
Total Bilirubin: 0.5 mg/dL (ref 0.0–1.2)
Total Protein: 6.6 g/dL (ref 6.0–8.5)

## 2014-02-23 LAB — NMR, LIPOPROFILE
Cholesterol: 136 mg/dL (ref <200)
HDL Cholesterol by NMR: 47 mg/dL (ref >=40)
HDL Particle Number: 32.5 umol/L (ref >=30.5)
LDL Particle Number: 840 nmol/L (ref <1000)
LDL Size: 20.5 nm — ABNORMAL LOW (ref >20.5)
LDLC SERPL CALC-MCNC: 61 mg/dL (ref <100)
LP-IR Score: 55 — ABNORMAL HIGH (ref <=45)
Small LDL Particle Number: 440 nmol/L (ref <=527)
Triglycerides by NMR: 139 mg/dL (ref <150)

## 2014-02-23 LAB — VITAMIN D 25 HYDROXY (VIT D DEFICIENCY, FRACTURES): Vit D, 25-Hydroxy: 27.7 ng/mL — ABNORMAL LOW (ref 30.0–100.0)

## 2014-03-02 ENCOUNTER — Telehealth: Payer: Self-pay | Admitting: Family Medicine

## 2014-03-03 NOTE — Telephone Encounter (Signed)
Patient aware.

## 2014-03-15 ENCOUNTER — Other Ambulatory Visit: Payer: Self-pay | Admitting: Family Medicine

## 2014-04-05 ENCOUNTER — Telehealth: Payer: Self-pay | Admitting: Family Medicine

## 2014-04-05 NOTE — Telephone Encounter (Signed)
No samples available at this time.  Check back in a week.

## 2014-04-14 ENCOUNTER — Telehealth: Payer: Self-pay | Admitting: Family Medicine

## 2014-04-14 NOTE — Telephone Encounter (Signed)
Patient aware to pick up 

## 2014-04-17 ENCOUNTER — Other Ambulatory Visit: Payer: Self-pay | Admitting: Family Medicine

## 2014-05-22 ENCOUNTER — Other Ambulatory Visit: Payer: Self-pay | Admitting: *Deleted

## 2014-05-22 MED ORDER — GLIPIZIDE 10 MG PO TABS
ORAL_TABLET | ORAL | Status: DC
Start: 2014-05-22 — End: 2014-07-17

## 2014-06-19 ENCOUNTER — Telehealth: Payer: Self-pay | Admitting: *Deleted

## 2014-06-19 ENCOUNTER — Telehealth: Payer: Self-pay | Admitting: Family Medicine

## 2014-06-19 MED ORDER — CANAGLIFLOZIN 100 MG PO TABS: 100.0000 mg | ORAL_TABLET | Freq: Every day | ORAL | Status: DC

## 2014-06-19 MED ORDER — CLONIDINE HCL 0.1 MG PO TABS: ORAL_TABLET | ORAL | Status: DC

## 2014-06-19 NOTE — Telephone Encounter (Signed)
Has appt with you 06/30/14

## 2014-06-30 ENCOUNTER — Encounter: Payer: Self-pay | Admitting: Family

## 2014-06-30 ENCOUNTER — Ambulatory Visit (INDEPENDENT_AMBULATORY_CARE_PROVIDER_SITE_OTHER): Payer: Medicare Other | Admitting: Family

## 2014-06-30 VITALS — BP 153/81 | HR 65 | Temp 98.7°F | Ht 67.0 in | Wt 158.8 lb

## 2014-06-30 DIAGNOSIS — E559 Vitamin D deficiency, unspecified: Secondary | ICD-10-CM

## 2014-06-30 DIAGNOSIS — E785 Hyperlipidemia, unspecified: Secondary | ICD-10-CM

## 2014-06-30 DIAGNOSIS — I10 Essential (primary) hypertension: Secondary | ICD-10-CM

## 2014-06-30 DIAGNOSIS — E119 Type 2 diabetes mellitus without complications: Secondary | ICD-10-CM

## 2014-06-30 DIAGNOSIS — Z77090 Contact with and (suspected) exposure to asbestos: Secondary | ICD-10-CM

## 2014-06-30 LAB — POCT GLYCOSYLATED HEMOGLOBIN (HGB A1C): Hemoglobin A1C: 6.5

## 2014-06-30 LAB — POCT UA - MICROALBUMIN: MICROALBUMIN (UR) POC: NEGATIVE mg/L

## 2014-06-30 MED ORDER — CANAGLIFLOZIN 100 MG PO TABS: 100.0000 mg | ORAL_TABLET | Freq: Every day | ORAL | Status: DC

## 2014-06-30 MED ORDER — LOSARTAN POTASSIUM 50 MG PO TABS: 50.0000 mg | ORAL_TABLET | Freq: Every day | ORAL | Status: DC

## 2014-06-30 NOTE — Patient Instructions (Signed)
Hypertension Hypertension, commonly called high blood pressure, is when the force of blood pumping through your arteries is too strong. Your arteries are the blood vessels that carry blood from your heart throughout your body. A blood pressure reading consists of a higher number over a lower number, such as 110/72. The higher number (systolic) is the pressure inside your arteries when your heart pumps. The lower number (diastolic) is the pressure inside your arteries when your heart relaxes. Ideally you want your blood pressure below 120/80. Hypertension forces your heart to work harder to pump blood. Your arteries may become narrow or stiff. Having hypertension puts you at risk for heart disease, stroke, and other problems.  RISK FACTORS Some risk factors for high blood pressure are controllable. Others are not.  Risk factors you cannot control include:   Race. You may be at higher risk if you are African American.  Age. Risk increases with age.  Gender. Men are at higher risk than women before age 45 years. After age 65, women are at higher risk than men. Risk factors you can control include:  Not getting enough exercise or physical activity.  Being overweight.  Getting too much fat, sugar, calories, or salt in your diet.  Drinking too much alcohol. SIGNS AND SYMPTOMS Hypertension does not usually cause signs or symptoms. Extremely high blood pressure (hypertensive crisis) may cause headache, anxiety, shortness of breath, and nosebleed. DIAGNOSIS  To check if you have hypertension, your health care provider will measure your blood pressure while you are seated, with your arm held at the level of your heart. It should be measured at least twice using the same arm. Certain conditions can cause a difference in blood pressure between your right and left arms. A blood pressure reading that is higher than normal on one occasion does not mean that you need treatment. If one blood pressure reading  is high, ask your health care provider about having it checked again. TREATMENT  Treating high blood pressure includes making lifestyle changes and possibly taking medication. Living a healthy lifestyle can help lower high blood pressure. You may need to change some of your habits. Lifestyle changes may include:  Following the DASH diet. This diet is high in fruits, vegetables, and whole grains. It is low in salt, red meat, and added sugars.  Getting at least 2 1/2 hours of brisk physical activity every week.  Losing weight if necessary.  Not smoking.  Limiting alcoholic beverages.  Learning ways to reduce stress. If lifestyle changes are not enough to get your blood pressure under control, your health care provider may prescribe medicine. You may need to take more than one. Work closely with your health care provider to understand the risks and benefits. HOME CARE INSTRUCTIONS  Have your blood pressure rechecked as directed by your health care provider.   Only take medicine as directed by your health care provider. Follow the directions carefully. Blood pressure medicines must be taken as prescribed. The medicine does not work as well when you skip doses. Skipping doses also puts you at risk for problems.   Do not smoke.   Monitor your blood pressure at home as directed by your health care provider. SEEK MEDICAL CARE IF:   You think you are having a reaction to medicines taken.  You have recurrent headaches or feel dizzy.  You have swelling in your ankles.  You have trouble with your vision. SEEK IMMEDIATE MEDICAL CARE IF:  You develop a severe headache or   confusion.  You have unusual weakness, numbness, or feel faint.  You have severe chest or abdominal pain.  You vomit repeatedly.  You have trouble breathing. MAKE SURE YOU:   Understand these instructions.  Will watch your condition.  Will get help right away if you are not doing well or get  worse. Document Released: 12/01/2005 Document Revised: 12/06/2013 Document Reviewed: 09/23/2013 [employer] Patient Information 2015 [employer]. This information is not intended to replace advice given to you by your health care provider. Make sure you discuss any questions you have with your health care provider.  

## 2014-06-30 NOTE — Progress Notes (Signed)
Subjective:    Patient ID: Robert Osborne, male    DOB: [birth year], 68 y.o.   MRN: [id number]  Diabetes He presents for his follow-up diabetic visit. He has type 2 diabetes mellitus. His disease course has been improving. Pertinent negatives for hypoglycemia include no confusion, dizziness or headaches. Pertinent negatives for diabetes include no blurred vision, no fatigue, no foot paresthesias and no foot ulcerations. Pertinent negatives for hypoglycemia complications include no blackouts. Symptoms are improving. Diabetic complications include peripheral neuropathy. Pertinent negatives for diabetic complications include no CVA or heart disease. Risk factors for coronary artery disease include diabetes mellitus, dyslipidemia, hypertension, male sex and sedentary lifestyle. Current diabetic treatment includes oral agent (triple therapy). He is compliant with treatment all of the time. He is following a generally healthy diet. His breakfast blood glucose range is generally 130-140 mg/dl. An ACE inhibitor/angiotensin II receptor blocker is not being taken. Eye exam is current.  Hypertension This is a chronic problem. The current episode started more than 1 year ago. The problem has been waxing and waning since onset. The problem is uncontrolled. Pertinent negatives include no anxiety, blurred vision, headaches, palpitations, peripheral edema or shortness of breath. Risk factors for coronary artery disease include diabetes mellitus, dyslipidemia and male gender. Past treatments include diuretics and direct vasodilators. The current treatment provides moderate improvement. There is no history of kidney disease, CAD/MI, CVA, heart failure or a thyroid problem. There is no history of sleep apnea.  Hyperlipidemia This is a chronic problem. The current episode started more than 1 year ago. The problem is uncontrolled. Recent lipid tests were reviewed and are high. Exacerbating diseases include diabetes. He has no  history of hypothyroidism. Factors aggravating his hyperlipidemia include fatty foods. Pertinent negatives include no focal weakness, leg pain, myalgias or shortness of breath. Current antihyperlipidemic treatment includes statins. The current treatment provides moderate improvement of lipids. Risk factors for coronary artery disease include diabetes mellitus, dyslipidemia, hypertension and male sex.      Review of Systems  Constitutional: Negative.  Negative for fatigue.  HENT: Negative.   Eyes: Negative for blurred vision.  Respiratory: Negative.  Negative for shortness of breath.   Cardiovascular: Negative.  Negative for palpitations.  Gastrointestinal: Negative.   Endocrine: Negative.   Genitourinary: Negative.   Musculoskeletal: Negative.  Negative for myalgias.  Neurological: Negative.  Negative for dizziness, focal weakness and headaches.  Hematological: Negative.   Psychiatric/Behavioral: Negative.  Negative for confusion.  All other systems reviewed and are negative.      Objective:   Physical Exam  Vitals reviewed. Constitutional: He is oriented to person, place, and time. He appears well-developed and well-nourished. No distress.  HENT:  Head: Normocephalic.  Right Ear: External ear normal.  Left Ear: External ear normal.  Mouth/Throat: Oropharynx is clear and moist.  Eyes: Pupils are equal, round, and reactive to light. Right eye exhibits no discharge. Left eye exhibits no discharge.  Neck: Normal range of motion. Neck supple. No thyromegaly present.  Cardiovascular: Normal rate, regular rhythm, normal heart sounds and intact distal pulses.   No murmur heard. Pulmonary/Chest: Effort normal and breath sounds normal. No respiratory distress. He has no wheezes.  Abdominal: Soft. Bowel sounds are normal. He exhibits no distension. There is no tenderness.  Musculoskeletal: Normal range of motion. He exhibits no edema and no tenderness.  Neurological: He is alert and  oriented to person, place, and time. He has normal reflexes. No cranial nerve deficit.  Skin: Skin is warm and dry.  No rash noted. No erythema.  Psychiatric: He has a normal mood and affect. His behavior is normal. Judgment and thought content normal.    BP 153/81  Pulse 65  Temp(Src) 98.7 F (37.1 C) (Oral)  Ht $R'5\' 7"'dW$  (1.702 m)  Wt 158 lb 12.8 oz (72.031 kg)  BMI 24.87 kg/m2       Assessment & Plan:  1. Essential hypertension - CMP14+EGFR - losartan (COZAAR) 50 MG tablet; Take 1 tablet (50 mg total) by mouth daily.  Dispense: 90 tablet; Refill: 3  2. Diabetes mellitus without complication - POCT UA - Microalbumin - POCT glycosylated hemoglobin (Hb A1C)  3. HLD (hyperlipidemia) - Lipid panel  4. Unspecified vitamin D deficiency - Vit D  25 hydroxy (rtn osteoporosis monitoring)  5. Asbestos exposure - DG Chest 2 View; Future   Continue all meds Labs pending Health Maintenance reviewed Diet and exercise encouraged RTO 3 weeks to recheck blood pressure  Evelina Dun, FNP   hemoccult cards given to patient with directions

## 2014-07-01 LAB — LIPID PANEL
Chol/HDL Ratio: 3.4 ratio units (ref 0.0–5.0)
Cholesterol, Total: 155 mg/dL (ref 100–199)
HDL: 46 mg/dL (ref >39)
LDL Calculated: 81 mg/dL (ref 0–99)
Triglycerides: 140 mg/dL (ref 0–149)
VLDL Cholesterol Cal: 28 mg/dL (ref 5–40)

## 2014-07-01 LAB — CMP14+EGFR
A/G RATIO: 1.6 (ref 1.1–2.5)
ALBUMIN: 4.3 g/dL (ref 3.5–4.8)
ALT: 12 IU/L (ref 0–44)
AST: 25 IU/L (ref 0–40)
Alkaline Phosphatase: 72 IU/L (ref 39–117)
BILIRUBIN TOTAL: 0.7 mg/dL (ref 0.0–1.2)
BUN/Creatinine Ratio: 12 (ref 10–22)
BUN: 13 mg/dL (ref 8–27)
CO2: 26 mmol/L (ref 18–29)
Calcium: 9.3 mg/dL (ref 8.6–10.2)
Chloride: 97 mmol/L (ref 97–108)
Creatinine, Ser: 1.11 mg/dL (ref 0.76–1.27)
GFR, EST AFRICAN AMERICAN: 77 mL/min/{1.73_m2} (ref >59)
GFR, EST NON AFRICAN AMERICAN: 67 mL/min/{1.73_m2} (ref >59)
GLOBULIN, TOTAL: 2.7 g/dL (ref 1.5–4.5)
GLUCOSE: 89 mg/dL (ref 65–99)
Potassium: 3.8 mmol/L (ref 3.5–5.2)
Sodium: 139 mmol/L (ref 134–144)
Total Protein: 7 g/dL (ref 6.0–8.5)

## 2014-07-01 LAB — VITAMIN D 25 HYDROXY (VIT D DEFICIENCY, FRACTURES): VIT D 25 HYDROXY: 30.5 ng/mL (ref 30.0–100.0)

## 2014-07-03 ENCOUNTER — Other Ambulatory Visit: Payer: Medicare Other

## 2014-07-03 ENCOUNTER — Ambulatory Visit (INDEPENDENT_AMBULATORY_CARE_PROVIDER_SITE_OTHER): Payer: Medicare Other

## 2014-07-03 ENCOUNTER — Telehealth: Payer: Self-pay | Admitting: Family Medicine

## 2014-07-03 DIAGNOSIS — Z77090 Contact with and (suspected) exposure to asbestos: Secondary | ICD-10-CM

## 2014-07-03 IMAGING — CR DG CHEST 2V
3 series · 3 of 3 positions shown · non-contrast
Comparison: None.

CLINICAL DATA: Asbestos exposure

EXAM:
CHEST  2 VIEW

[view not recorded (1 of 3)]
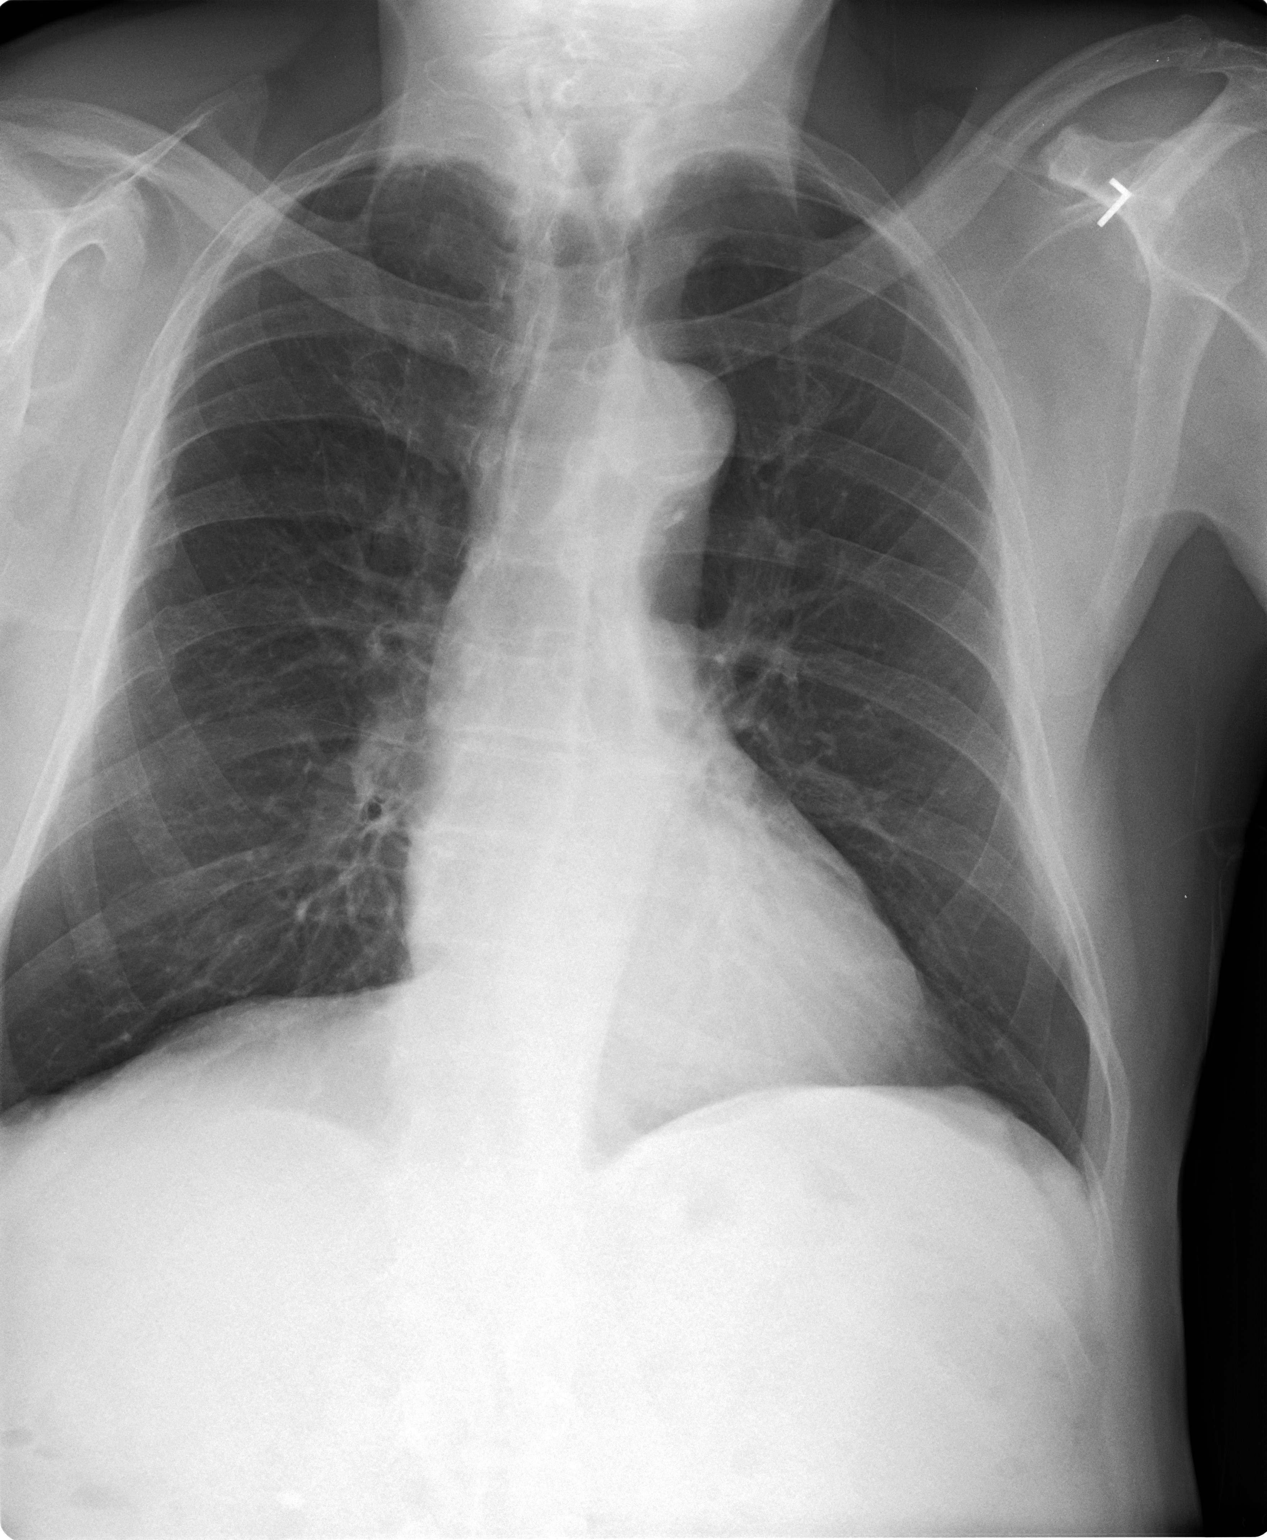

[view not recorded (2 of 3)]
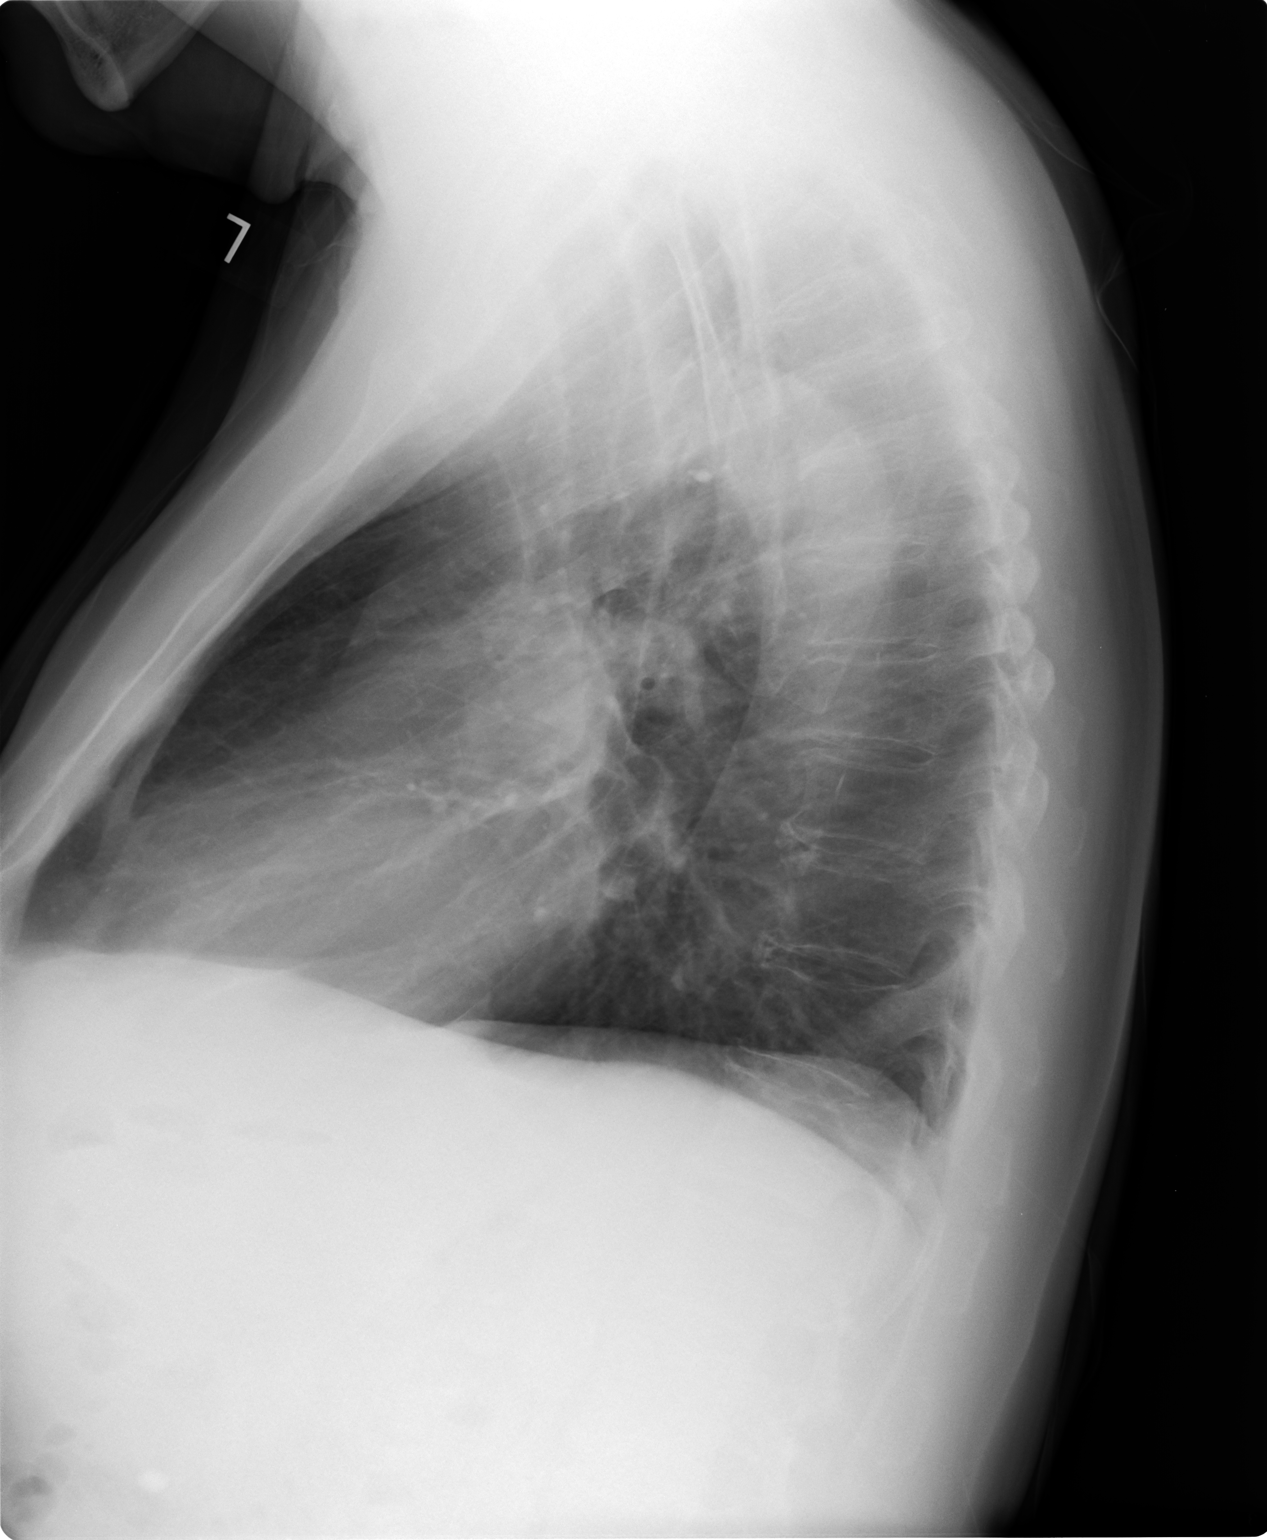

[view not recorded (3 of 3)]
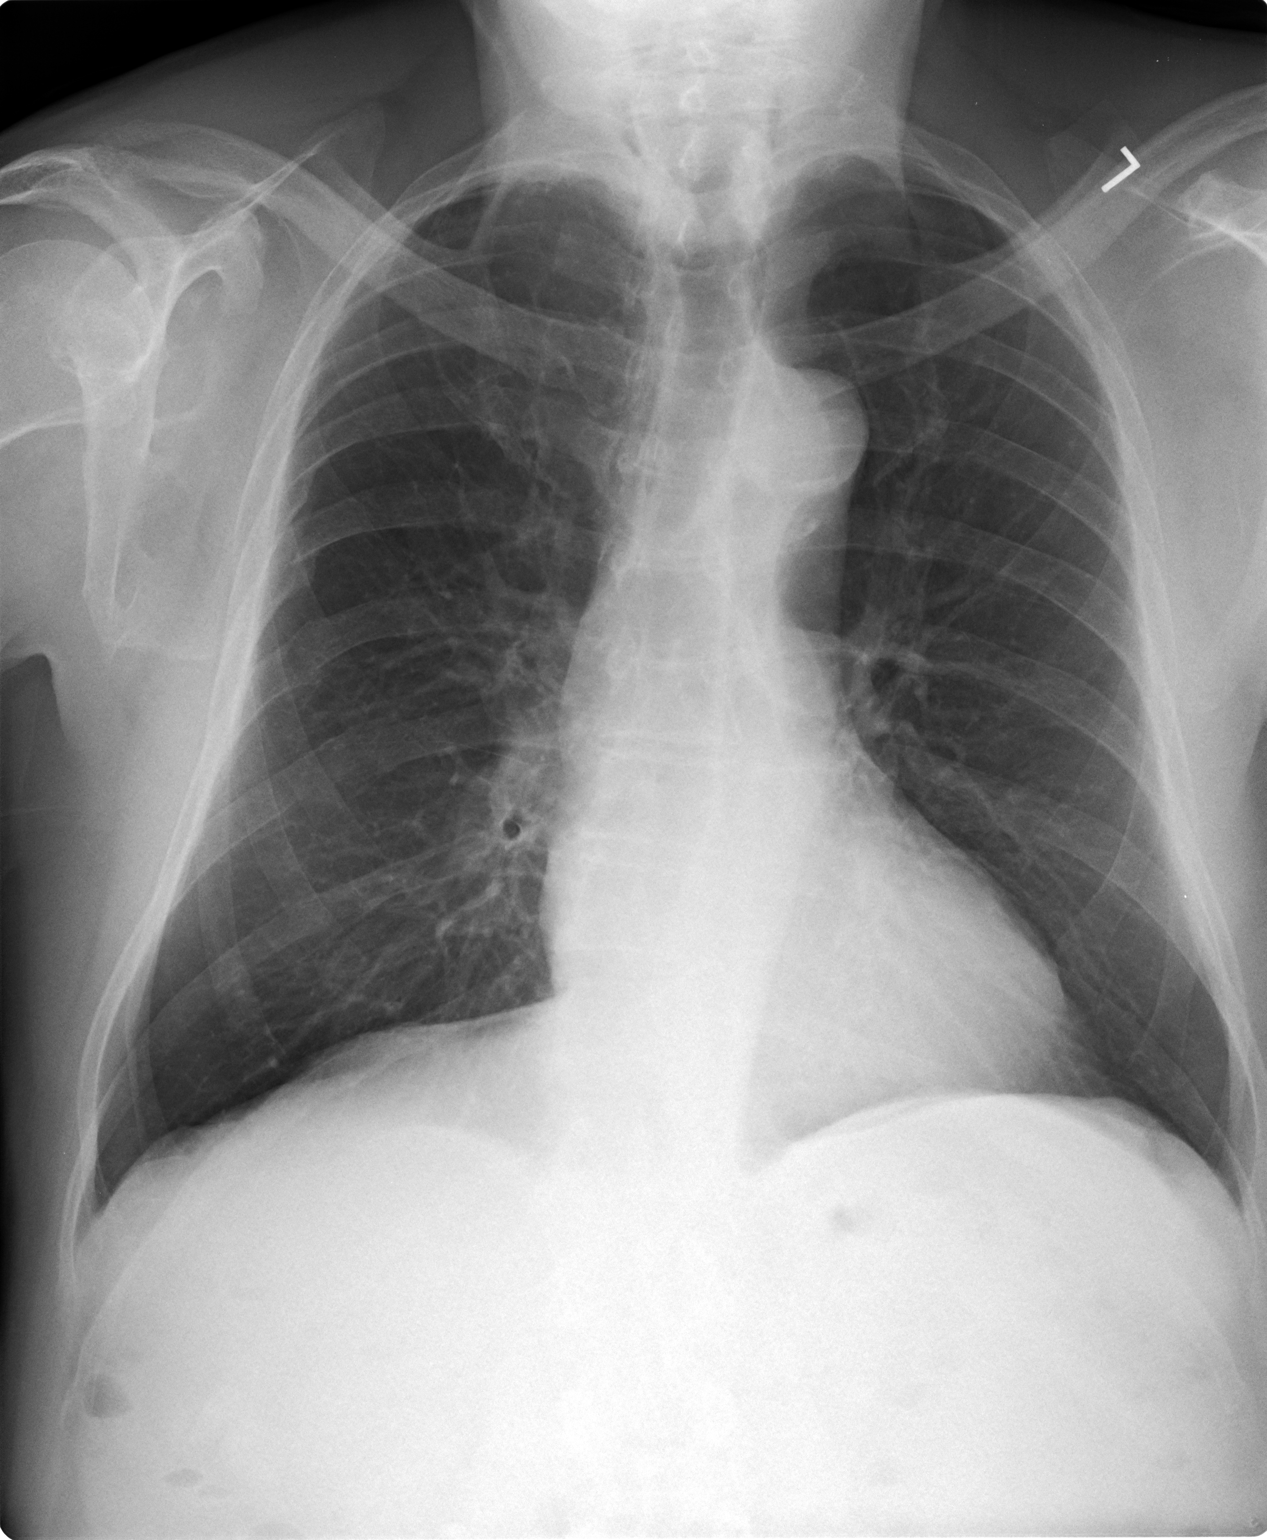

[3 of 3 positions shown; findings below may reference images not displayed]

FINDINGS: The heart size and vascular pattern are normal. The lungs are clear.
There are old posterior lateral right rib fractures. There are no
pleural effusions or pleural plaques. There is hyperinflation
suggesting COPD.
IMPRESSION: No acute findings. There is evidence of COPD with no findings
specific for asbestos exposure.

## 2014-07-03 NOTE — Addendum Note (Signed)
Addended by: Evelina Dun A on: 07/03/2014 10:26 AM   Modules accepted: Orders

## 2014-07-03 NOTE — Progress Notes (Signed)
Patient aware to come in for chest Xray

## 2014-07-03 NOTE — Telephone Encounter (Signed)
Message copied by Waverly Ferrari on Mon Jul 03, 2014  2:31 PM ------      Message from: HAWKS, [place] A      Created: Mon Jul 03, 2014  2:20 PM       Chest X-ray negative for asbestos exposure, does show COPD ------

## 2014-07-05 ENCOUNTER — Other Ambulatory Visit: Payer: Self-pay | Admitting: *Deleted

## 2014-07-05 MED ORDER — SIMVASTATIN 40 MG PO TABS: ORAL_TABLET | ORAL | Status: DC

## 2014-07-06 ENCOUNTER — Encounter: Payer: Self-pay | Admitting: *Deleted

## 2014-07-17 ENCOUNTER — Other Ambulatory Visit: Payer: Self-pay | Admitting: Family

## 2014-07-18 ENCOUNTER — Encounter: Payer: Self-pay | Admitting: *Deleted

## 2014-07-18 ENCOUNTER — Other Ambulatory Visit: Payer: Self-pay | Admitting: *Deleted

## 2014-07-18 MED ORDER — METFORMIN HCL 1000 MG PO TABS: ORAL_TABLET | ORAL | Status: DC

## 2014-07-25 ENCOUNTER — Ambulatory Visit: Payer: Medicare Other | Admitting: Family

## 2014-08-11 ENCOUNTER — Encounter: Payer: Self-pay | Admitting: Family

## 2014-08-11 ENCOUNTER — Ambulatory Visit (INDEPENDENT_AMBULATORY_CARE_PROVIDER_SITE_OTHER): Payer: Medicare Other | Admitting: Family

## 2014-08-11 VITALS — BP 143/80 | HR 53 | Temp 99.0°F | Ht 67.0 in | Wt 166.8 lb

## 2014-08-11 DIAGNOSIS — I10 Essential (primary) hypertension: Secondary | ICD-10-CM

## 2014-08-11 MED ORDER — LOSARTAN POTASSIUM 100 MG PO TABS: 100.0000 mg | ORAL_TABLET | Freq: Every day | ORAL | Status: DC

## 2014-08-11 NOTE — Progress Notes (Signed)
   Subjective:    Patient ID: Robert Osborne, male    DOB: [birth year], 68 y.o.   MRN: [id number]  Hypertension   PT presents to the office for hypertension follow-up. Pt currently taking losartan 50 mg, hydrochlorothiazide 25 mg, and clonidine 0.1mg  PO daily. Pt states he is feeling good and does not have any complaints at this time. Pt denies any pain, SOB, palpations, or edema at this time.    Review of Systems  Constitutional: Negative.   HENT: Negative.   Respiratory: Negative.   Cardiovascular: Negative.   Gastrointestinal: Negative.   Endocrine: Negative.   Genitourinary: Negative.   Musculoskeletal: Negative.   Neurological: Negative.   Hematological: Negative.   Psychiatric/Behavioral: Negative.   All other systems reviewed and are negative.      Objective:   Physical Exam  Vitals reviewed. Constitutional: He is oriented to person, place, and time. He appears well-developed and well-nourished. No distress.  Eyes: Pupils are equal, round, and reactive to light. Right eye exhibits no discharge. Left eye exhibits no discharge.  Neck: Normal range of motion. Neck supple. No thyromegaly present.  Cardiovascular: Normal rate, regular rhythm, normal heart sounds and intact distal pulses.   No murmur heard. Pulmonary/Chest: Effort normal and breath sounds normal. No respiratory distress. He has no wheezes.  Abdominal: Soft. Bowel sounds are normal. He exhibits no distension. There is no tenderness.  Musculoskeletal: Normal range of motion. He exhibits no edema and no tenderness.  Neurological: He is alert and oriented to person, place, and time. He has normal reflexes. No cranial nerve deficit.  Skin: Skin is warm and dry. No rash noted. No erythema.  Psychiatric: He has a normal mood and affect. His behavior is normal. Judgment and thought content normal.    BP 143/80  Pulse 53  Temp(Src) 99 F (37.2 C) (Oral)  Ht 5\' 7"  (1.702 m)  Wt 166 lb 12.8 oz (75.66 kg)  BMI 26.12  kg/m2       Assessment & Plan:  1. Essential hypertension -Exercise encouraged - Stress Management  -Continue current meds -RTO im 2 weeks for recheck blood pressure - losartan (COZAAR) 100 MG tablet; Take 1 tablet (100 mg total) by mouth daily.  Dispense: 90 tablet; Refill: [place], FNP

## 2014-08-11 NOTE — Patient Instructions (Signed)

## 2014-08-22 ENCOUNTER — Telehealth: Payer: Self-pay | Admitting: Family

## 2014-08-22 NOTE — Telephone Encounter (Signed)
done

## 2014-08-23 MED ORDER — CANAGLIFLOZIN 100 MG PO TABS: 100.0000 mg | ORAL_TABLET | Freq: Every day | ORAL | Status: DC

## 2014-08-23 NOTE — Telephone Encounter (Signed)
Samples up front 

## 2014-09-16 ENCOUNTER — Other Ambulatory Visit: Payer: Self-pay | Admitting: Family Medicine

## 2014-10-20 ENCOUNTER — Other Ambulatory Visit: Payer: Self-pay | Admitting: Family Medicine

## 2014-11-23 ENCOUNTER — Other Ambulatory Visit: Payer: Self-pay | Admitting: Family Medicine

## 2014-11-23 ENCOUNTER — Other Ambulatory Visit: Payer: Self-pay | Admitting: Family

## 2014-11-23 MED ORDER — GLIPIZIDE 10 MG PO TABS: ORAL_TABLET | ORAL | Status: DC

## 2014-11-23 NOTE — Telephone Encounter (Signed)
Prescriptions are okay to refill. Please make sure that the patient has an appointment to see a provider here in January

## 2014-11-23 NOTE — Telephone Encounter (Signed)
Last ov 08/11/14. Last AIC 6.5  On 7/15.

## 2014-11-24 ENCOUNTER — Other Ambulatory Visit: Payer: Self-pay | Admitting: Family Medicine

## 2014-11-24 ENCOUNTER — Other Ambulatory Visit: Payer: Self-pay | Admitting: Family

## 2014-12-23 ENCOUNTER — Other Ambulatory Visit: Payer: Self-pay | Admitting: Family Medicine

## 2014-12-25 ENCOUNTER — Other Ambulatory Visit: Payer: Self-pay | Admitting: *Deleted

## 2014-12-25 MED ORDER — HYDROCHLOROTHIAZIDE 25 MG PO TABS: ORAL_TABLET | ORAL | Status: DC

## 2015-01-04 ENCOUNTER — Ambulatory Visit (INDEPENDENT_AMBULATORY_CARE_PROVIDER_SITE_OTHER): Payer: Medicare Other | Admitting: Family

## 2015-01-04 ENCOUNTER — Encounter: Payer: Self-pay | Admitting: Family

## 2015-01-04 VITALS — BP 132/87 | HR 77 | Temp 97.5°F | Ht 67.0 in | Wt 162.0 lb

## 2015-01-04 DIAGNOSIS — E559 Vitamin D deficiency, unspecified: Secondary | ICD-10-CM

## 2015-01-04 DIAGNOSIS — E119 Type 2 diabetes mellitus without complications: Secondary | ICD-10-CM

## 2015-01-04 DIAGNOSIS — H1013 Acute atopic conjunctivitis, bilateral: Secondary | ICD-10-CM

## 2015-01-04 DIAGNOSIS — E785 Hyperlipidemia, unspecified: Secondary | ICD-10-CM

## 2015-01-04 DIAGNOSIS — I10 Essential (primary) hypertension: Secondary | ICD-10-CM | POA: Diagnosis not present

## 2015-01-04 LAB — POCT GLYCOSYLATED HEMOGLOBIN (HGB A1C): Hemoglobin A1C: 7.5

## 2015-01-04 MED ORDER — GLIPIZIDE 10 MG PO TABS: 10.0000 mg | ORAL_TABLET | Freq: Every day | ORAL | Status: DC

## 2015-01-04 MED ORDER — LORATADINE 10 MG PO TABS: 10.0000 mg | ORAL_TABLET | Freq: Every day | ORAL | Status: DC

## 2015-01-04 MED ORDER — METFORMIN HCL 1000 MG PO TABS: 1000.0000 mg | ORAL_TABLET | Freq: Two times a day (BID) | ORAL | Status: DC

## 2015-01-04 MED ORDER — CANAGLIFLOZIN 100 MG PO TABS: 100.0000 mg | ORAL_TABLET | Freq: Every day | ORAL | Status: DC

## 2015-01-04 NOTE — Progress Notes (Signed)
Subjective:    Patient ID: Robert Osborne, male    DOB: [birth year], 69 y.o.   MRN: [id number]  Diabetes He presents for his follow-up diabetic visit. He has type 2 diabetes mellitus. His disease course has been improving. Pertinent negatives for hypoglycemia include no confusion, dizziness or headaches. Pertinent negatives for diabetes include no blurred vision, no fatigue, no foot paresthesias and no foot ulcerations. Pertinent negatives for hypoglycemia complications include no blackouts. Symptoms are improving. Pertinent negatives for diabetic complications include no CVA, heart disease, nephropathy or peripheral neuropathy. Risk factors for coronary artery disease include diabetes mellitus, dyslipidemia, hypertension, male sex, sedentary lifestyle and family history. Current diabetic treatment includes oral agent (triple therapy). He is compliant with treatment all of the time. He is following a generally healthy diet. (Pt states he has not been checking them ) An ACE inhibitor/angiotensin II receptor blocker is not being taken. Eye exam is current.  Hypertension This is a chronic problem. The current episode started more than 1 year ago. The problem has been waxing and waning since onset. The problem is uncontrolled. Pertinent negatives include no anxiety, blurred vision, headaches, palpitations, peripheral edema or shortness of breath. Risk factors for coronary artery disease include diabetes mellitus, dyslipidemia, male gender and family history. Past treatments include diuretics and direct vasodilators. The current treatment provides moderate improvement. There is no history of kidney disease, CAD/MI, CVA, heart failure or a thyroid problem. There is no history of sleep apnea.  Hyperlipidemia This is a chronic problem. The current episode started more than 1 year ago. The problem is uncontrolled. Recent lipid tests were reviewed and are high. Exacerbating diseases include diabetes. He has no  history of hypothyroidism. Factors aggravating his hyperlipidemia include fatty foods. Pertinent negatives include no focal weakness, leg pain, myalgias or shortness of breath. Current antihyperlipidemic treatment includes statins. The current treatment provides moderate improvement of lipids. Risk factors for coronary artery disease include diabetes mellitus, dyslipidemia, hypertension, male sex and family history.      Review of Systems  Constitutional: Negative.  Negative for fatigue.  HENT: Negative.   Eyes: Negative for blurred vision.  Respiratory: Negative.  Negative for shortness of breath.   Cardiovascular: Negative.  Negative for palpitations.  Gastrointestinal: Negative.   Endocrine: Negative.   Genitourinary: Negative.   Musculoskeletal: Negative.  Negative for myalgias.  Neurological: Negative.  Negative for dizziness, focal weakness and headaches.  Hematological: Negative.   Psychiatric/Behavioral: Negative.  Negative for confusion.  All other systems reviewed and are negative.      Objective:   Physical Exam  Constitutional: He is oriented to person, place, and time. He appears well-developed and well-nourished. No distress.  HENT:  Head: Normocephalic.  Right Ear: External ear normal.  Left Ear: External ear normal.  Mouth/Throat: Oropharynx is clear and moist.  Eyes: Pupils are equal, round, and reactive to light. Right eye exhibits no discharge. Left eye exhibits no discharge.  Neck: Normal range of motion. Neck supple. No thyromegaly present.  Cardiovascular: Normal rate, regular rhythm, normal heart sounds and intact distal pulses.   No murmur heard. Pulmonary/Chest: Effort normal and breath sounds normal. No respiratory distress. He has no wheezes.  Abdominal: Soft. Bowel sounds are normal. He exhibits no distension. There is no tenderness.  Musculoskeletal: Normal range of motion. He exhibits no edema or tenderness.  Neurological: He is alert and oriented to  person, place, and time. He has normal reflexes. No cranial nerve deficit.  Skin: Skin is warm and  dry. No rash noted. No erythema.  Psychiatric: He has a normal mood and affect. His behavior is normal. Judgment and thought content normal.  Vitals reviewed.   BP 132/87 mmHg  Pulse 77  Temp(Src) 97.5 F (36.4 C) (Oral)  Ht $R'5\' 7"'tu$  (1.702 m)  Wt 162 lb (73.483 kg)  BMI 25.37 kg/m2  *See diabetic foot  note     Assessment & Plan:  1. Essential hypertension - CMP14+EGFR  2. Diabetes mellitus without complication - UYQ03+KVQQ - POCT glycosylated hemoglobin (Hb A1C) - canagliflozin (INVOKANA) 100 MG TABS tablet; Take 1 tablet (100 mg total) by mouth daily.  Dispense: 90 tablet; Refill: 3 - glipiZIDE (GLUCOTROL) 10 MG tablet; Take 1 tablet (10 mg total) by mouth daily.  Dispense: 90 tablet; Refill: 4 - metFORMIN (GLUCOPHAGE) 1000 MG tablet; Take 1 tablet (1,000 mg total) by mouth 2 (two) times daily.  Dispense: 180 tablet; Refill: 4  3. Vitamin D deficiency - CMP14+EGFR - Vit D  25 hydroxy (rtn osteoporosis monitoring)  4. HLD (hyperlipidemia) - CMP14+EGFR - Lipid panel  5. Allergic conjunctivitis, bilateral - loratadine (CLARITIN) 10 MG tablet; Take 1 tablet (10 mg total) by mouth daily.  Dispense: 90 tablet; Refill: 3   Continue all meds Labs pending Health Maintenance reviewed Diet and exercise encouraged RTO 6 month  Evelina Dun, FNP

## 2015-01-04 NOTE — Patient Instructions (Signed)

## 2015-01-05 LAB — CMP14+EGFR
ALBUMIN: 4.5 g/dL (ref 3.5–4.8)
ALT: 14 IU/L (ref 0–44)
AST: 19 IU/L (ref 0–40)
Albumin/Globulin Ratio: 1.6 (ref 1.1–2.5)
Alkaline Phosphatase: 84 IU/L (ref 39–117)
BUN/Creatinine Ratio: 12 (ref 10–22)
BUN: 14 mg/dL (ref 8–27)
CALCIUM: 10.1 mg/dL (ref 8.6–10.2)
CO2: 26 mmol/L (ref 18–29)
Chloride: 98 mmol/L (ref 97–108)
Creatinine, Ser: 1.19 mg/dL (ref 0.76–1.27)
GFR calc Af Amer: 71 mL/min/{1.73_m2} (ref >59)
GFR calc non Af Amer: 62 mL/min/{1.73_m2} (ref >59)
Globulin, Total: 2.8 g/dL (ref 1.5–4.5)
Glucose: 131 mg/dL — ABNORMAL HIGH (ref 65–99)
POTASSIUM: 4.6 mmol/L (ref 3.5–5.2)
Sodium: 142 mmol/L (ref 134–144)
TOTAL PROTEIN: 7.3 g/dL (ref 6.0–8.5)
Total Bilirubin: 1 mg/dL (ref 0.0–1.2)

## 2015-01-05 LAB — VITAMIN D 25 HYDROXY (VIT D DEFICIENCY, FRACTURES): Vit D, 25-Hydroxy: 40.9 ng/mL (ref 30.0–100.0)

## 2015-01-05 LAB — LIPID PANEL
Chol/HDL Ratio: 4 ratio units (ref 0.0–5.0)
Cholesterol, Total: 181 mg/dL (ref 100–199)
HDL: 45 mg/dL (ref >39)
LDL Calculated: 102 mg/dL — ABNORMAL HIGH (ref 0–99)
TRIGLYCERIDES: 170 mg/dL — AB (ref 0–149)
VLDL Cholesterol Cal: 34 mg/dL (ref 5–40)

## 2015-01-08 ENCOUNTER — Other Ambulatory Visit: Payer: Self-pay | Admitting: Family

## 2015-01-08 MED ORDER — ATORVASTATIN CALCIUM 40 MG PO TABS: 40.0000 mg | ORAL_TABLET | Freq: Every day | ORAL | Status: DC

## 2015-01-28 ENCOUNTER — Other Ambulatory Visit: Payer: Self-pay | Admitting: Family

## 2015-02-01 ENCOUNTER — Ambulatory Visit: Payer: Self-pay | Admitting: Family

## 2015-02-18 ENCOUNTER — Other Ambulatory Visit: Payer: Self-pay | Admitting: Family

## 2015-02-25 ENCOUNTER — Other Ambulatory Visit: Payer: Self-pay | Admitting: Family

## 2015-04-13 ENCOUNTER — Encounter (INDEPENDENT_AMBULATORY_CARE_PROVIDER_SITE_OTHER): Payer: Medicare Other | Admitting: Family Medicine

## 2015-04-13 ENCOUNTER — Encounter: Payer: Self-pay | Admitting: Family Medicine

## 2015-04-13 VITALS — BP 152/74 | HR 61 | Temp 97.4°F | Ht 67.0 in | Wt 162.2 lb

## 2015-04-13 DIAGNOSIS — E785 Hyperlipidemia, unspecified: Secondary | ICD-10-CM

## 2015-04-13 DIAGNOSIS — I10 Essential (primary) hypertension: Secondary | ICD-10-CM

## 2015-04-13 DIAGNOSIS — E119 Type 2 diabetes mellitus without complications: Secondary | ICD-10-CM

## 2015-04-13 DIAGNOSIS — E559 Vitamin D deficiency, unspecified: Secondary | ICD-10-CM

## 2015-04-13 LAB — POCT CBC

## 2015-04-13 LAB — POCT GLYCOSYLATED HEMOGLOBIN (HGB A1C): Hemoglobin A1C: 8.2

## 2015-04-13 NOTE — Addendum Note (Signed)
Addended by: Selmer Dominion on: 04/13/2015 09:50 AM   Modules accepted: Orders

## 2015-04-13 NOTE — Progress Notes (Signed)
Erroneous

## 2015-04-14 LAB — CBC WITH DIFFERENTIAL/PLATELET
Basophils Absolute: 0 10*3/uL (ref 0.0–0.2)
Basos: 0 %
EOS (ABSOLUTE): 0 10*3/uL (ref 0.0–0.4)
Eos: 1 %
HEMATOCRIT: 44.3 % (ref 37.5–51.0)
Hemoglobin: 14.9 g/dL (ref 12.6–17.7)
Immature Grans (Abs): 0 10*3/uL (ref 0.0–0.1)
Immature Granulocytes: 0 %
LYMPHS ABS: 0.9 10*3/uL (ref 0.7–3.1)
Lymphs: 28 %
MCH: 29.1 pg (ref 26.6–33.0)
MCHC: 33.6 g/dL (ref 31.5–35.7)
MCV: 87 fL (ref 79–97)
MONOCYTES: 11 %
Monocytes Absolute: 0.4 10*3/uL (ref 0.1–0.9)
NEUTROS ABS: 2 10*3/uL (ref 1.4–7.0)
Neutrophils: 60 %
Platelets: 232 10*3/uL (ref 150–379)
RBC: 5.12 x10E6/uL (ref 4.14–5.80)
RDW: 13.1 % (ref 12.3–15.4)
WBC: 3.3 10*3/uL — ABNORMAL LOW (ref 3.4–10.8)

## 2015-04-14 LAB — CMP14+EGFR
ALT: 14 IU/L (ref 0–44)
AST: 18 IU/L (ref 0–40)
Albumin/Globulin Ratio: 1.6 (ref 1.1–2.5)
Albumin: 3.9 g/dL (ref 3.5–4.8)
Alkaline Phosphatase: 74 IU/L (ref 39–117)
BUN/Creatinine Ratio: 12 (ref 10–22)
BUN: 13 mg/dL (ref 8–27)
Bilirubin Total: 1.2 mg/dL (ref 0.0–1.2)
CALCIUM: 9.2 mg/dL (ref 8.6–10.2)
CO2: 24 mmol/L (ref 18–29)
Chloride: 103 mmol/L (ref 97–108)
Creatinine, Ser: 1.07 mg/dL (ref 0.76–1.27)
GFR calc Af Amer: 80 mL/min/{1.73_m2} (ref >59)
GFR, EST NON AFRICAN AMERICAN: 69 mL/min/{1.73_m2} (ref >59)
Globulin, Total: 2.4 g/dL (ref 1.5–4.5)
Glucose: 138 mg/dL — ABNORMAL HIGH (ref 65–99)
POTASSIUM: 4.7 mmol/L (ref 3.5–5.2)
Sodium: 144 mmol/L (ref 134–144)
Total Protein: 6.3 g/dL (ref 6.0–8.5)

## 2015-04-14 LAB — LIPID PANEL
Chol/HDL Ratio: 2.7 ratio units (ref 0.0–5.0)
Cholesterol, Total: 122 mg/dL (ref 100–199)
HDL: 45 mg/dL (ref >39)
LDL Calculated: 58 mg/dL (ref 0–99)
Triglycerides: 97 mg/dL (ref 0–149)
VLDL CHOLESTEROL CAL: 19 mg/dL (ref 5–40)

## 2015-05-09 ENCOUNTER — Encounter: Payer: Self-pay | Admitting: Family

## 2015-05-09 ENCOUNTER — Ambulatory Visit (INDEPENDENT_AMBULATORY_CARE_PROVIDER_SITE_OTHER): Payer: Medicare Other | Admitting: Family

## 2015-05-09 VITALS — BP 155/93 | HR 80 | Temp 97.9°F | Ht 67.0 in | Wt 157.8 lb

## 2015-05-09 DIAGNOSIS — Z85118 Personal history of other malignant neoplasm of bronchus and lung: Secondary | ICD-10-CM | POA: Diagnosis not present

## 2015-05-09 DIAGNOSIS — E559 Vitamin D deficiency, unspecified: Secondary | ICD-10-CM | POA: Diagnosis not present

## 2015-05-09 DIAGNOSIS — R252 Cramp and spasm: Secondary | ICD-10-CM

## 2015-05-09 DIAGNOSIS — I10 Essential (primary) hypertension: Secondary | ICD-10-CM

## 2015-05-09 DIAGNOSIS — E785 Hyperlipidemia, unspecified: Secondary | ICD-10-CM | POA: Diagnosis not present

## 2015-05-09 DIAGNOSIS — E119 Type 2 diabetes mellitus without complications: Secondary | ICD-10-CM | POA: Diagnosis not present

## 2015-05-09 LAB — POCT GLYCOSYLATED HEMOGLOBIN (HGB A1C): Hemoglobin A1C: 7.5

## 2015-05-09 NOTE — Patient Instructions (Addendum)
Hypertension Hypertension, commonly called high blood pressure, is when the force of blood pumping through your arteries is too strong. Your arteries are the blood vessels that carry blood from your heart throughout your body. A blood pressure reading consists of a higher number over a lower number, such as 110/72. The higher number (systolic) is the pressure inside your arteries when your heart pumps. The lower number (diastolic) is the pressure inside your arteries when your heart relaxes. Ideally you want your blood pressure below 120/80. Hypertension forces your heart to work harder to pump blood. Your arteries may become narrow or stiff. Having hypertension puts you at risk for heart disease, stroke, and other problems.  RISK FACTORS Some risk factors for high blood pressure are controllable. Others are not.  Risk factors you cannot control include:   Race. You may be at higher risk if you are African American.  Age. Risk increases with age.  Gender. Men are at higher risk than women before age 45 years. After age 65, women are at higher risk than men. Risk factors you can control include:  Not getting enough exercise or physical activity.  Being overweight.  Getting too much fat, sugar, calories, or salt in your diet.  Drinking too much alcohol. SIGNS AND SYMPTOMS Hypertension does not usually cause signs or symptoms. Extremely high blood pressure (hypertensive crisis) may cause headache, anxiety, shortness of breath, and nosebleed. DIAGNOSIS  To check if you have hypertension, your health care provider will measure your blood pressure while you are seated, with your arm held at the level of your heart. It should be measured at least twice using the same arm. Certain conditions can cause a difference in blood pressure between your right and left arms. A blood pressure reading that is higher than normal on one occasion does not mean that you need treatment. If one blood pressure reading  is high, ask your health care provider about having it checked again. TREATMENT  Treating high blood pressure includes making lifestyle changes and possibly taking medicine. Living a healthy lifestyle can help lower high blood pressure. You may need to change some of your habits. Lifestyle changes may include:  Following the DASH diet. This diet is high in fruits, vegetables, and whole grains. It is low in salt, red meat, and added sugars.  Getting at least 2 hours of brisk physical activity every week.  Losing weight if necessary.  Not smoking.  Limiting alcoholic beverages.  Learning ways to reduce stress. If lifestyle changes are not enough to get your blood pressure under control, your health care provider may prescribe medicine. You may need to take more than one. Work closely with your health care provider to understand the risks and benefits. HOME CARE INSTRUCTIONS  Have your blood pressure rechecked as directed by your health care provider.   Take medicines only as directed by your health care provider. Follow the directions carefully. Blood pressure medicines must be taken as prescribed. The medicine does not work as well when you skip doses. Skipping doses also puts you at risk for problems.   Do not smoke.   Monitor your blood pressure at home as directed by your health care provider. SEEK MEDICAL CARE IF:   You think you are having a reaction to medicines taken.  You have recurrent headaches or feel dizzy.  You have swelling in your ankles.  You have trouble with your vision. SEEK IMMEDIATE MEDICAL CARE IF:  You develop a severe headache or confusion.    You have unusual weakness, numbness, or feel faint.  You have severe chest or abdominal pain.  You vomit repeatedly.  You have trouble breathing. MAKE SURE YOU:   Understand these instructions.  Will watch your condition.  Will get help right away if you are not doing well or get worse. Document  Released: 12/01/2005 Document Revised: 04/17/2014 Document Reviewed: 09/23/2013 [employer] Patient Information 2015 [employer]. This information is not intended to replace advice given to you by your health care provider. Make sure you discuss any questions you have with your health care provider. Health Maintenance A healthy lifestyle and preventative care can promote health and wellness.  Maintain regular health, dental, and eye exams.  Eat a healthy diet. Foods like vegetables, fruits, whole grains, low-fat dairy products, and lean protein foods contain the nutrients you need and are low in calories. Decrease your intake of foods high in solid fats, added sugars, and salt. Get information about a proper diet from your health care provider, if necessary.  Regular physical exercise is one of the most important things you can do for your health. Most adults should get at least 150 minutes of moderate-intensity exercise (any activity that increases your heart rate and causes you to sweat) each week. In addition, most adults need muscle-strengthening exercises on 2 or more days a week.   Maintain a healthy weight. The body mass index (BMI) is a screening tool to identify possible weight problems. It provides an estimate of body fat based on height and weight. Your health care provider can find your BMI and can help you achieve or maintain a healthy weight. For males 20 years and older:  A BMI below 18.5 is considered underweight.  A BMI of 18.5 to 24.9 is normal.  A BMI of 25 to 29.9 is considered overweight.  A BMI of 30 and above is considered obese.  Maintain normal blood lipids and cholesterol by exercising and minimizing your intake of saturated fat. Eat a balanced diet with plenty of fruits and vegetables. Blood tests for lipids and cholesterol should begin at age 20 and be repeated every 5 years. If your lipid or cholesterol levels are high, you are over age 50, or you are at high risk  for heart disease, you may need your cholesterol levels checked more frequently.Ongoing high lipid and cholesterol levels should be treated with medicines if diet and exercise are not working.  If you smoke, find out from your health care provider how to quit. If you do not use tobacco, do not start.  Lung cancer screening is recommended for adults aged 55-80 years who are at high risk for developing lung cancer because of a history of smoking. A yearly low-dose CT scan of the lungs is recommended for people who have at least a 30-pack-year history of smoking and are current smokers or have quit within the past 15 years. A pack year of smoking is smoking an average of 1 pack of cigarettes a day for 1 year (for example, a 30-pack-year history of smoking could mean smoking 1 pack a day for 30 years or 2 packs a day for 15 years). Yearly screening should continue until the smoker has stopped smoking for at least 15 years. Yearly screening should be stopped for people who develop a health problem that would prevent them from having lung cancer treatment.  If you choose to drink alcohol, do not have more than 2 drinks per day. One drink is considered to be 12 oz (  360 mL) of beer, 5 oz (150 mL) of wine, or 1.5 oz (45 mL) of liquor.  Avoid the use of street drugs. Do not share needles with anyone. Ask for help if you need support or instructions about stopping the use of drugs.  High blood pressure causes heart disease and increases the risk of stroke. Blood pressure should be checked at least every 1-2 years. Ongoing high blood pressure should be treated with medicines if weight loss and exercise are not effective.  If you are 45-79 years old, ask your health care provider if you should take aspirin to prevent heart disease.  Diabetes screening involves taking a blood sample to check your fasting blood sugar level. This should be done once every 3 years after age 45 if you are at a normal weight and without  risk factors for diabetes. Testing should be considered at a younger age or be carried out more frequently if you are overweight and have at least 1 risk factor for diabetes.  Colorectal cancer can be detected and often prevented. Most routine colorectal cancer screening begins at the age of 50 and continues through age 75. However, your health care provider may recommend screening at an earlier age if you have risk factors for colon cancer. On a yearly basis, your health care provider may provide home test kits to check for hidden blood in the stool. A small camera at the end of a tube may be used to directly examine the colon (sigmoidoscopy or colonoscopy) to detect the earliest forms of colorectal cancer. Talk to your health care provider about this at age 50 when routine screening begins. A direct exam of the colon should be repeated every 5-10 years through age 75, unless early forms of precancerous polyps or small growths are found.  People who are at an increased risk for hepatitis B should be screened for this virus. You are considered at high risk for hepatitis B if:  You were born in a country where hepatitis B occurs often. Talk with your health care provider about which countries are considered high risk.  Your parents were born in a high-risk country and you have not received a shot to protect against hepatitis B (hepatitis B vaccine).  You have HIV or AIDS.  You use needles to inject street drugs.  You live with, or have sex with, someone who has hepatitis B.  You are a man who has sex with other men (MSM).  You get hemodialysis treatment.  You take certain medicines for conditions like cancer, organ transplantation, and autoimmune conditions.  Hepatitis C blood testing is recommended for all people born from 1945 through 1965 and any individual with known risk factors for hepatitis C.  Healthy men should no longer receive prostate-specific antigen (PSA) blood tests as part of  routine cancer screening. Talk to your health care provider about prostate cancer screening.  Testicular cancer screening is not recommended for adolescents or adult males who have no symptoms. Screening includes self-exam, a health care provider exam, and other screening tests. Consult with your health care provider about any symptoms you have or any concerns you have about testicular cancer.  Practice safe sex. Use condoms and avoid high-risk sexual practices to reduce the spread of sexually transmitted infections (STIs).  You should be screened for STIs, including gonorrhea and chlamydia if:  You are sexually active and are younger than 24 years.  You are older than 24 years, and your health care provider tells you   that you are at risk for this type of infection.  Your sexual activity has changed since you were last screened, and you are at an increased risk for chlamydia or gonorrhea. Ask your health care provider if you are at risk.  If you are at risk of being infected with HIV, it is recommended that you take a prescription medicine daily to prevent HIV infection. This is called pre-exposure prophylaxis (PrEP). You are considered at risk if:  You are a man who has sex with other men (MSM).  You are a heterosexual man who is sexually active with multiple partners.  You take drugs by injection.  You are sexually active with a partner who has HIV.  Talk with your health care provider about whether you are at high risk of being infected with HIV. If you choose to begin PrEP, you should first be tested for HIV. You should then be tested every 3 months for as long as you are taking PrEP.  Use sunscreen. Apply sunscreen liberally and repeatedly throughout the day. You should seek shade when your shadow is shorter than you. Protect yourself by wearing long sleeves, pants, a wide-brimmed hat, and sunglasses year round whenever you are outdoors.  Tell your health care provider of new moles  or changes in moles, especially if there is a change in shape or color. Also, tell your health care provider if a mole is larger than the size of a pencil eraser.  A one-time screening for abdominal aortic aneurysm (AAA) and surgical repair of large AAAs by ultrasound is recommended for men aged 26-75 years who are current or former smokers.  Stay current with your vaccines (immunizations). Document Released: 05/29/2008 Document Revised: 12/06/2013 Document Reviewed: 04/28/2011 [facility] Patient Information 2015 [place], [place]. This information is not intended to replace advice given to you by your health care provider. Make sure you discuss any questions you have with your health care provider. Leg Cramps Leg cramps that occur during exercise can be caused by poor circulation or dehydration. However, muscle cramps that occur at rest or during the night are usually not due to any serious medical problem. Heat cramps may cause muscle spasms during hot weather.  CAUSES There is no clear cause for muscle cramps. However, dehydration may be a factor for those who do not drink enough fluids and those who exercise in the heat. Imbalances in the level of sodium, potassium, calcium or magnesium in the muscle tissue may also be a factor. Some medications, such as water pills (diuretics), may cause loss of chemicals that the body needs (like sodium and potassium) and cause muscle cramps. TREATMENT   Make sure your diet has enough fluids and essential minerals for the muscle to work normally.  Avoid strenuous exercise for several days if you have been having frequent leg cramps.  Stretch and massage the cramped muscle for several minutes.  Some medicines may be helpful in some patients with night cramps. Only take over-the-counter or prescription medicines as directed by your caregiver. SEEK IMMEDIATE MEDICAL CARE IF:   Your leg cramps become worse.  Your foot becomes cold, numb, or blue. Document  Released: 01/08/2005 Document Revised: 02/23/2012 Document Reviewed: 12/26/2008 [facility] Patient Information 2015 [place], [place]. This information is not intended to replace advice given to you by your health care provider. Make sure you discuss any questions you have with your health care provider.

## 2015-05-09 NOTE — Progress Notes (Signed)
Subjective:    Patient ID: Robert Osborne, male    DOB: 10-16-1946, 69 y.o.   MRN: [id number]  Pt presents to the office today for Chronic follow up. Pt's only complaints today of occasionally leg cramps.  Diabetes He presents for his follow-up diabetic visit. He has type 2 diabetes mellitus. His disease course has been improving. Pertinent negatives for hypoglycemia include no confusion, dizziness or headaches. Pertinent negatives for diabetes include no blurred vision, no fatigue, no foot paresthesias and no foot ulcerations. Pertinent negatives for hypoglycemia complications include no blackouts. Symptoms are improving. Pertinent negatives for diabetic complications include no CVA, heart disease, nephropathy or peripheral neuropathy. Risk factors for coronary artery disease include diabetes mellitus, dyslipidemia, hypertension, male sex, sedentary lifestyle and family history. Current diabetic treatment includes oral agent (triple therapy). He is compliant with treatment all of the time. He is following a generally healthy diet. (Pt states he has not been checking them ) An ACE inhibitor/angiotensin II receptor blocker is being taken. Eye exam is current.  Hypertension This is a chronic problem. The current episode started more than 1 year ago. The problem has been waxing and waning since onset. The problem is uncontrolled. Pertinent negatives include no anxiety, blurred vision, headaches, palpitations, peripheral edema or shortness of breath. Risk factors for coronary artery disease include diabetes mellitus, dyslipidemia, male gender and family history. Past treatments include diuretics and direct vasodilators. The current treatment provides moderate improvement. There is no history of kidney disease, CAD/MI, CVA, heart failure or a thyroid problem. There is no history of sleep apnea.  Hyperlipidemia This is a chronic problem. The current episode started more than 1 year ago. The problem is  controlled. Recent lipid tests were reviewed and are normal. Exacerbating diseases include diabetes. He has no history of hypothyroidism. Factors aggravating his hyperlipidemia include fatty foods. Pertinent negatives include no focal weakness, leg pain, myalgias or shortness of breath. Current antihyperlipidemic treatment includes statins. The current treatment provides moderate improvement of lipids. Risk factors for coronary artery disease include diabetes mellitus, dyslipidemia, hypertension, male sex and family history.      Review of Systems  Constitutional: Negative.  Negative for fatigue.  HENT: Negative.   Eyes: Negative for blurred vision.  Respiratory: Negative.  Negative for shortness of breath.   Cardiovascular: Negative.  Negative for palpitations.  Gastrointestinal: Negative.   Endocrine: Negative.   Genitourinary: Negative.   Musculoskeletal: Negative.  Negative for myalgias.  Neurological: Negative.  Negative for dizziness, focal weakness and headaches.  Hematological: Negative.   Psychiatric/Behavioral: Negative.  Negative for confusion.  All other systems reviewed and are negative.      Objective:   Physical Exam  Constitutional: He is oriented to person, place, and time. He appears well-developed and well-nourished. No distress.  HENT:  Head: Normocephalic.  Right Ear: External ear normal.  Left Ear: External ear normal.  Nose: Nose normal.  Mouth/Throat: Oropharynx is clear and moist.  Eyes: Pupils are equal, round, and reactive to light. Right eye exhibits no discharge. Left eye exhibits no discharge.  Neck: Normal range of motion. Neck supple. No thyromegaly present.  Cardiovascular: Normal rate, regular rhythm, normal heart sounds and intact distal pulses.   No murmur heard. Pulmonary/Chest: Effort normal and breath sounds normal. No respiratory distress. He has no wheezes.  Abdominal: Soft. Bowel sounds are normal. He exhibits no distension. There is no  tenderness.  Musculoskeletal: Normal range of motion. He exhibits no edema or tenderness.  Neurological: He  is alert and oriented to person, place, and time. He has normal reflexes. No cranial nerve deficit.  Skin: Skin is warm and dry. No rash noted. No erythema.  Psychiatric: He has a normal mood and affect. His behavior is normal. Judgment and thought content normal.  Vitals reviewed.     BP 155/93 mmHg  Pulse 80  Temp(Src) 97.9 F (36.6 C) (Oral)  Ht _0  (1.702 m)  Wt 157 lb 12.8 oz (71.578 kg)  BMI 24.71 kg/m2      Assessment & Plan:  1. Essential hypertension - CMP14+EGFR  2. Diabetes mellitus without complication - POCT glycosylated hemoglobin (Hb A1C) - CMP14+EGFR  3. Vitamin D deficiency - CMP14+EGFR - Vit D  25 hydroxy (rtn osteoporosis monitoring)  4. HLD (hyperlipidemia) - CMP14+EGFR - Lipid panel  5. Cramp of both lower extremities - Magnesium  6. Hx of cancer of lung    Continue all meds Labs pending Health Maintenance reviewed Diet and exercise encouraged RTO 3 months  Evelina Dun, FNP

## 2015-05-10 LAB — CMP14+EGFR
A/G RATIO: 1.6 (ref 1.1–2.5)
ALT: 12 IU/L (ref 0–44)
AST: 17 IU/L (ref 0–40)
Albumin: 4.3 g/dL (ref 3.5–4.8)
Alkaline Phosphatase: 80 IU/L (ref 39–117)
BUN/Creatinine Ratio: 14 (ref 10–22)
BUN: 15 mg/dL (ref 8–27)
Bilirubin Total: 1.1 mg/dL (ref 0.0–1.2)
CHLORIDE: 105 mmol/L (ref 97–108)
CO2: 22 mmol/L (ref 18–29)
Calcium: 9.6 mg/dL (ref 8.6–10.2)
Creatinine, Ser: 1.05 mg/dL (ref 0.76–1.27)
GFR calc Af Amer: 82 mL/min/{1.73_m2} (ref >59)
GFR calc non Af Amer: 71 mL/min/{1.73_m2} (ref >59)
Globulin, Total: 2.7 g/dL (ref 1.5–4.5)
Glucose: 120 mg/dL — ABNORMAL HIGH (ref 65–99)
Potassium: 4 mmol/L (ref 3.5–5.2)
Sodium: 145 mmol/L — ABNORMAL HIGH (ref 134–144)
Total Protein: 7 g/dL (ref 6.0–8.5)

## 2015-05-10 LAB — LIPID PANEL
Chol/HDL Ratio: 2.7 ratio units (ref 0.0–5.0)
Cholesterol, Total: 138 mg/dL (ref 100–199)
HDL: 52 mg/dL (ref >39)
LDL Calculated: 67 mg/dL (ref 0–99)
Triglycerides: 96 mg/dL (ref 0–149)
VLDL Cholesterol Cal: 19 mg/dL (ref 5–40)

## 2015-05-10 LAB — VITAMIN D 25 HYDROXY (VIT D DEFICIENCY, FRACTURES): Vit D, 25-Hydroxy: 37 ng/mL (ref 30.0–100.0)

## 2015-05-10 LAB — MAGNESIUM: Magnesium: 1.8 mg/dL (ref 1.6–2.3)

## 2015-05-11 ENCOUNTER — Other Ambulatory Visit: Payer: Self-pay | Admitting: Family

## 2015-05-11 MED ORDER — CANAGLIFLOZIN 300 MG PO TABS: 300.0000 mg | ORAL_TABLET | Freq: Every day | ORAL | Status: DC

## 2015-05-16 ENCOUNTER — Encounter: Payer: Self-pay | Admitting: *Deleted

## 2015-08-10 ENCOUNTER — Encounter: Payer: Self-pay | Admitting: Family

## 2015-08-10 ENCOUNTER — Ambulatory Visit (INDEPENDENT_AMBULATORY_CARE_PROVIDER_SITE_OTHER): Payer: Medicare Other | Admitting: Family

## 2015-08-10 VITALS — BP 159/90 | HR 72 | Temp 97.9°F | Ht 67.0 in | Wt 159.4 lb

## 2015-08-10 DIAGNOSIS — I10 Essential (primary) hypertension: Secondary | ICD-10-CM

## 2015-08-10 DIAGNOSIS — E119 Type 2 diabetes mellitus without complications: Secondary | ICD-10-CM

## 2015-08-10 DIAGNOSIS — N529 Male erectile dysfunction, unspecified: Secondary | ICD-10-CM

## 2015-08-10 DIAGNOSIS — E785 Hyperlipidemia, unspecified: Secondary | ICD-10-CM | POA: Diagnosis not present

## 2015-08-10 DIAGNOSIS — Z125 Encounter for screening for malignant neoplasm of prostate: Secondary | ICD-10-CM

## 2015-08-10 DIAGNOSIS — E559 Vitamin D deficiency, unspecified: Secondary | ICD-10-CM | POA: Diagnosis not present

## 2015-08-10 DIAGNOSIS — C679 Malignant neoplasm of bladder, unspecified: Secondary | ICD-10-CM | POA: Diagnosis not present

## 2015-08-10 LAB — POCT GLYCOSYLATED HEMOGLOBIN (HGB A1C): Hemoglobin A1C: 6.7

## 2015-08-10 LAB — POCT UA - MICROALBUMIN: Microalbumin Ur, POC: 20 mg/L

## 2015-08-10 MED ORDER — GLIPIZIDE 10 MG PO TABS: 10.0000 mg | ORAL_TABLET | Freq: Every day | ORAL | Status: DC

## 2015-08-10 MED ORDER — HYDROCHLOROTHIAZIDE 25 MG PO TABS: 25.0000 mg | ORAL_TABLET | Freq: Every day | ORAL | Status: DC

## 2015-08-10 MED ORDER — ATORVASTATIN CALCIUM 40 MG PO TABS: 40.0000 mg | ORAL_TABLET | Freq: Every day | ORAL | Status: DC

## 2015-08-10 MED ORDER — LOSARTAN POTASSIUM 100 MG PO TABS: 100.0000 mg | ORAL_TABLET | Freq: Every day | ORAL | Status: DC

## 2015-08-10 MED ORDER — CLONIDINE HCL 0.1 MG PO TABS: 0.1000 mg | ORAL_TABLET | Freq: Every day | ORAL | Status: DC

## 2015-08-10 MED ORDER — CANAGLIFLOZIN 300 MG PO TABS: 300.0000 mg | ORAL_TABLET | Freq: Every day | ORAL | Status: DC

## 2015-08-10 MED ORDER — METFORMIN HCL 1000 MG PO TABS: 1000.0000 mg | ORAL_TABLET | Freq: Two times a day (BID) | ORAL | Status: DC

## 2015-08-10 MED ORDER — TADALAFIL 20 MG PO TABS: 10.0000 mg | ORAL_TABLET | ORAL | Status: DC | PRN

## 2015-08-10 NOTE — Patient Instructions (Signed)

## 2015-08-10 NOTE — Addendum Note (Signed)
Addended by: Earlene Plater on: 08/10/2015 04:30 PM   Modules accepted: Miquel Dunn

## 2015-08-10 NOTE — Addendum Note (Signed)
Addended by: Earlene Plater on: 08/10/2015 04:45 PM   Modules accepted: Orders, SmartSet

## 2015-08-10 NOTE — Progress Notes (Signed)
Subjective:    Patient ID: Robert Osborne, male    DOB: [birth year], 69 y.o.   MRN: [id number]  Pt presents to the office today for chronic follow up. Pt states he "may of ran out of his blood pressure medication". Pt states he is only take 5 pills and ran out of "something".  Hypertension This is a chronic problem. The current episode started more than 1 year ago. The problem has been waxing and waning since onset. The problem is uncontrolled. Pertinent negatives include no anxiety, blurred vision, headaches, palpitations, peripheral edema or shortness of breath. Risk factors for coronary artery disease include diabetes mellitus, dyslipidemia, male gender and family history. Past treatments include diuretics and direct vasodilators. The current treatment provides moderate improvement. There is no history of kidney disease, CAD/MI, CVA, heart failure or a thyroid problem. There is no history of sleep apnea.  Diabetes He presents for his follow-up diabetic visit. He has type 2 diabetes mellitus. His disease course has been improving. Pertinent negatives for hypoglycemia include no confusion, dizziness or headaches. Associated symptoms include visual change. Pertinent negatives for diabetes include no blurred vision, no fatigue, no foot paresthesias and no foot ulcerations. Pertinent negatives for hypoglycemia complications include no blackouts. Symptoms are improving. Pertinent negatives for diabetic complications include no CVA, heart disease, nephropathy or peripheral neuropathy. Risk factors for coronary artery disease include diabetes mellitus, dyslipidemia, hypertension, male sex, sedentary lifestyle and family history. Current diabetic treatment includes oral agent (triple therapy). He is compliant with treatment all of the time. He is following a generally healthy diet. (Pt states he has not been checking them ) An ACE inhibitor/angiotensin II receptor blocker is being taken. Eye exam is current.    Hyperlipidemia This is a chronic problem. The current episode started more than 1 year ago. The problem is controlled. Recent lipid tests were reviewed and are normal. Exacerbating diseases include diabetes. He has no history of hypothyroidism. Factors aggravating his hyperlipidemia include fatty foods. Pertinent negatives include no focal weakness, leg pain, myalgias or shortness of breath. Current antihyperlipidemic treatment includes statins. The current treatment provides moderate improvement of lipids. Risk factors for coronary artery disease include diabetes mellitus, dyslipidemia, hypertension, male sex and family history.  Erectile Dysfunction This is a chronic problem. The current episode started more than 1 month ago. The problem is unchanged. The nature of his difficulty is achieving erection and maintaining erection. Irritative symptoms include nocturia.      Review of Systems  Constitutional: Negative.  Negative for fatigue.  HENT: Negative.   Eyes: Negative for blurred vision.  Respiratory: Negative.  Negative for shortness of breath.   Cardiovascular: Negative.  Negative for palpitations.  Gastrointestinal: Negative.   Endocrine: Negative.   Genitourinary: Positive for nocturia.  Musculoskeletal: Negative.  Negative for myalgias.  Neurological: Negative.  Negative for dizziness, focal weakness and headaches.  Hematological: Negative.   Psychiatric/Behavioral: Negative.  Negative for confusion.  All other systems reviewed and are negative.      Objective:   Physical Exam  Constitutional: He is oriented to person, place, and time. He appears well-developed and well-nourished. No distress.  HENT:  Head: Normocephalic.  Right Ear: External ear normal.  Left Ear: External ear normal.  Nose: Nose normal.  Mouth/Throat: Oropharynx is clear and moist.  Eyes: Pupils are equal, round, and reactive to light. Right eye exhibits no discharge. Left eye exhibits no discharge.   Neck: Normal range of motion. Neck supple. No thyromegaly present.  Cardiovascular:  Normal rate, regular rhythm, normal heart sounds and intact distal pulses.   No murmur heard. Pulmonary/Chest: Effort normal and breath sounds normal. No respiratory distress. He has no wheezes.  Abdominal: Soft. Bowel sounds are normal. He exhibits no distension. There is no tenderness.  Musculoskeletal: Normal range of motion. He exhibits no edema or tenderness.  Neurological: He is alert and oriented to person, place, and time. He has normal reflexes. No cranial nerve deficit.  Skin: Skin is warm and dry. No rash noted. No erythema.  Psychiatric: He has a normal mood and affect. His behavior is normal. Judgment and thought content normal.  Vitals reviewed.   BP 159/90 mmHg  Pulse 72  Temp(Src) 97.9 F (36.6 C) (Oral)  Ht _0  (1.702 m)  Wt 159 lb 6.4 oz (72.303 kg)  BMI 24.96 kg/m2       Assessment & Plan:  1. Essential hypertension - CMP14+EGFR - losartan (COZAAR) 100 MG tablet; Take 1 tablet (100 mg total) by mouth daily.  Dispense: 90 tablet; Refill: 3 - hydrochlorothiazide (HYDRODIURIL) 25 MG tablet; Take 1 tablet (25 mg total) by mouth daily.  Dispense: 90 tablet; Refill: 3 - cloNIDine (CATAPRES) 0.1 MG tablet; Take 1 tablet (0.1 mg total) by mouth daily.  Dispense: 90 tablet; Refill: 3  2. Diabetes mellitus without complicatio - POCT glycosylated hemoglobin (Hb A1C) - POCT UA - Microalbumin - CMP14+EGFR - metFORMIN (GLUCOPHAGE) 1000 MG tablet; Take 1 tablet (1,000 mg total) by mouth 2 (two) times daily.  Dispense: 180 tablet; Refill: 4 - glipiZIDE (GLUCOTROL) 10 MG tablet; Take 1 tablet (10 mg total) by mouth daily.  Dispense: 90 tablet; Refill: 4 - canagliflozin (INVOKANA) 300 MG TABS tablet; Take 300 mg by mouth daily before breakfast.  Dispense: 90 tablet; Refill: 3  3. Malignant neoplasm of urinary bladder, unspecified site - CMP14+EGFR  4. Vitamin D deficiency -  CMP14+EGFR  5. HLD (hyperlipidemia) - CMP14+EGFR - Lipid panel - atorvastatin (LIPITOR) 40 MG tablet; Take 1 tablet (40 mg total) by mouth daily.  Dispense: 90 tablet; Refill: 3  6. Prostate cancer screening - CMP14+EGFR - PSA, total and free  7. Erectile dysfunction, unspecified erectile dysfunction type - tadalafil (CIALIS) 20 MG tablet; Take 0.5-1 tablets (10-20 mg total) by mouth every other day as needed for erectile dysfunction.  Dispense: 5 tablet; Refill: 11   Continue all meds Labs pending Health Maintenance reviewed Diet and exercise encouraged RTO 2 weeks recheck HTN- I reordered all of pts medications and will recheck once he is taking all of his medicaitons  Evelina Dun, FNP

## 2015-08-11 LAB — LIPID PANEL
CHOLESTEROL TOTAL: 148 mg/dL (ref 100–199)
Chol/HDL Ratio: 3 ratio units (ref 0.0–5.0)
HDL: 50 mg/dL (ref >39)
LDL Calculated: 73 mg/dL (ref 0–99)
Triglycerides: 127 mg/dL (ref 0–149)
VLDL CHOLESTEROL CAL: 25 mg/dL (ref 5–40)

## 2015-08-11 LAB — CMP14+EGFR
ALK PHOS: 69 IU/L (ref 39–117)
ALT: 13 IU/L (ref 0–44)
AST: 19 IU/L (ref 0–40)
Albumin/Globulin Ratio: 1.7 (ref 1.1–2.5)
Albumin: 4 g/dL (ref 3.5–4.8)
BUN / CREAT RATIO: 15 (ref 10–22)
BUN: 15 mg/dL (ref 8–27)
Bilirubin Total: 0.7 mg/dL (ref 0.0–1.2)
CHLORIDE: 100 mmol/L (ref 97–108)
CO2: 25 mmol/L (ref 18–29)
Calcium: 9 mg/dL (ref 8.6–10.2)
Creatinine, Ser: 1.03 mg/dL (ref 0.76–1.27)
GFR calc Af Amer: 84 mL/min/{1.73_m2} (ref >59)
GFR calc non Af Amer: 73 mL/min/{1.73_m2} (ref >59)
GLOBULIN, TOTAL: 2.3 g/dL (ref 1.5–4.5)
GLUCOSE: 234 mg/dL — AB (ref 65–99)
Potassium: 3.5 mmol/L (ref 3.5–5.2)
SODIUM: 142 mmol/L (ref 134–144)
Total Protein: 6.3 g/dL (ref 6.0–8.5)

## 2015-08-11 LAB — PSA, TOTAL AND FREE
PSA FREE PCT: 27.5 %
PSA FREE: 0.88 ng/mL
Prostate Specific Ag, Serum: 3.2 ng/mL (ref 0.0–4.0)

## 2015-08-11 LAB — MICROALBUMIN, URINE: MICROALBUM., U, RANDOM: 13.6 ug/mL

## 2015-08-14 ENCOUNTER — Other Ambulatory Visit: Payer: Self-pay | Admitting: Family

## 2015-08-24 ENCOUNTER — Ambulatory Visit (INDEPENDENT_AMBULATORY_CARE_PROVIDER_SITE_OTHER): Payer: Medicare Other | Admitting: Family

## 2015-08-24 ENCOUNTER — Encounter: Payer: Self-pay | Admitting: Family

## 2015-08-24 VITALS — BP 137/79 | HR 79 | Temp 97.8°F | Ht 67.0 in | Wt 156.0 lb

## 2015-08-24 DIAGNOSIS — I10 Essential (primary) hypertension: Secondary | ICD-10-CM | POA: Diagnosis not present

## 2015-08-24 NOTE — Patient Instructions (Signed)

## 2015-08-24 NOTE — Progress Notes (Signed)
   Subjective:    Patient ID: Robert Osborne, male    DOB: [birth year], 69 y.o.   MRN: [id number]  Pt presents to the office today to recheck HTN. PT's BP is at goal today.  Hypertension This is a chronic problem. The current episode started more than 1 year ago. The problem has been resolved since onset. The problem is controlled. Pertinent negatives include no anxiety, headaches, palpitations, peripheral edema or shortness of breath. Risk factors for coronary artery disease include dyslipidemia, diabetes mellitus, obesity, sedentary lifestyle and family history. Past treatments include angiotensin blockers and diuretics. There is no history of kidney disease, CAD/MI, CVA, heart failure or a thyroid problem. There is no history of sleep apnea.      Review of Systems  Constitutional: Negative.   HENT: Negative.   Respiratory: Negative.  Negative for shortness of breath.   Cardiovascular: Negative.  Negative for palpitations.  Gastrointestinal: Negative.   Endocrine: Negative.   Genitourinary: Negative.   Musculoskeletal: Negative.   Neurological: Negative.  Negative for headaches.  Hematological: Negative.   Psychiatric/Behavioral: Negative.   All other systems reviewed and are negative.      Objective:   Physical Exam  Constitutional: He is oriented to person, place, and time. He appears well-developed and well-nourished. No distress.  HENT:  Head: Normocephalic.  Right Ear: External ear normal.  Left Ear: External ear normal.  Nose: Nose normal.  Mouth/Throat: Oropharynx is clear and moist.  Eyes: Pupils are equal, round, and reactive to light. Right eye exhibits no discharge. Left eye exhibits no discharge.  Neck: Normal range of motion. Neck supple. No thyromegaly present.  Cardiovascular: Normal rate, regular rhythm, normal heart sounds and intact distal pulses.   No murmur heard. Pulmonary/Chest: Effort normal and breath sounds normal. No respiratory distress. He has no  wheezes.  Abdominal: Soft. Bowel sounds are normal. He exhibits no distension. There is no tenderness.  Musculoskeletal: Normal range of motion. He exhibits no edema or tenderness.  Neurological: He is alert and oriented to person, place, and time. He has normal reflexes. No cranial nerve deficit.  Skin: Skin is warm and dry. No rash noted. No erythema.  Psychiatric: He has a normal mood and affect. His behavior is normal. Judgment and thought content normal.  Vitals reviewed.   BP 137/79 mmHg  Pulse 79  Temp(Src) 97.8 F (36.6 C) (Oral)  Ht _0  (1.702 m)  Wt 156 lb (70.761 kg)  BMI 24.43 kg/m2       Assessment & Plan:  1. Essential hypertension -Dash diet information given -Exercise encouraged - Stress Management  -Continue current meds -RTO in 3 months for chronic follow up - [place], FNP

## 2015-08-25 LAB — BMP8+EGFR
BUN / CREAT RATIO: 11 (ref 10–22)
BUN: 13 mg/dL (ref 8–27)
CHLORIDE: 97 mmol/L (ref 97–108)
CO2: 26 mmol/L (ref 18–29)
Calcium: 9.6 mg/dL (ref 8.6–10.2)
Creatinine, Ser: 1.14 mg/dL (ref 0.76–1.27)
GFR calc non Af Amer: 64 mL/min/{1.73_m2} (ref >59)
GFR, EST AFRICAN AMERICAN: 74 mL/min/{1.73_m2} (ref >59)
Glucose: 234 mg/dL — ABNORMAL HIGH (ref 65–99)
POTASSIUM: 3.4 mmol/L — AB (ref 3.5–5.2)
Sodium: 141 mmol/L (ref 134–144)

## 2015-08-27 ENCOUNTER — Other Ambulatory Visit: Payer: Self-pay | Admitting: Family

## 2015-08-27 ENCOUNTER — Ambulatory Visit: Payer: Medicare Other | Admitting: Family Medicine

## 2015-08-28 ENCOUNTER — Encounter: Payer: Self-pay | Admitting: Family

## 2015-10-16 DIAGNOSIS — H52223 Regular astigmatism, bilateral: Secondary | ICD-10-CM | POA: Diagnosis not present

## 2015-10-16 DIAGNOSIS — E119 Type 2 diabetes mellitus without complications: Secondary | ICD-10-CM | POA: Diagnosis not present

## 2015-11-06 ENCOUNTER — Telehealth: Payer: Self-pay | Admitting: Family

## 2016-01-02 ENCOUNTER — Ambulatory Visit (INDEPENDENT_AMBULATORY_CARE_PROVIDER_SITE_OTHER): Payer: Medicare HMO | Admitting: Family

## 2016-01-02 ENCOUNTER — Encounter: Payer: Self-pay | Admitting: Family

## 2016-01-02 VITALS — BP 142/80 | HR 81 | Temp 97.9°F | Ht 67.0 in | Wt 161.0 lb

## 2016-01-02 DIAGNOSIS — E119 Type 2 diabetes mellitus without complications: Secondary | ICD-10-CM

## 2016-01-02 DIAGNOSIS — E785 Hyperlipidemia, unspecified: Secondary | ICD-10-CM

## 2016-01-02 DIAGNOSIS — Z87898 Personal history of other specified conditions: Secondary | ICD-10-CM | POA: Diagnosis not present

## 2016-01-02 DIAGNOSIS — M7989 Other specified soft tissue disorders: Secondary | ICD-10-CM

## 2016-01-02 DIAGNOSIS — I10 Essential (primary) hypertension: Secondary | ICD-10-CM | POA: Diagnosis not present

## 2016-01-02 NOTE — Patient Instructions (Signed)
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
## 2016-01-03 ENCOUNTER — Other Ambulatory Visit: Payer: Self-pay | Admitting: Family

## 2016-01-03 DIAGNOSIS — D509 Iron deficiency anemia, unspecified: Secondary | ICD-10-CM

## 2016-01-03 LAB — ANEMIA PROFILE B
BASOS: 0 %
Basophils Absolute: 0 10*3/uL (ref 0.0–0.2)
EOS (ABSOLUTE): 0 10*3/uL (ref 0.0–0.4)
EOS: 1 %
FERRITIN: 11 ng/mL — AB (ref 30–400)
FOLATE: 11.8 ng/mL (ref >3.0)
HEMATOCRIT: 40.1 % (ref 37.5–51.0)
HEMOGLOBIN: 13.1 g/dL (ref 12.6–17.7)
IMMATURE GRANS (ABS): 0 10*3/uL (ref 0.0–0.1)
IMMATURE GRANULOCYTES: 0 %
IRON SATURATION: 9 % — AB (ref 15–55)
IRON: 32 ug/dL — AB (ref 38–169)
LYMPHS ABS: 0.8 10*3/uL (ref 0.7–3.1)
Lymphs: 18 %
MCH: 26.5 pg — AB (ref 26.6–33.0)
MCHC: 32.7 g/dL (ref 31.5–35.7)
MCV: 81 fL (ref 79–97)
MONOS ABS: 0.5 10*3/uL (ref 0.1–0.9)
Monocytes: 12 %
NEUTROS PCT: 69 %
Neutrophils Absolute: 3 10*3/uL (ref 1.4–7.0)
Platelets: 244 10*3/uL (ref 150–379)
RBC: 4.95 x10E6/uL (ref 4.14–5.80)
RDW: 15 % (ref 12.3–15.4)
Retic Ct Pct: 1.6 % (ref 0.6–2.6)
TIBC: 368 ug/dL (ref 250–450)
UIBC: 336 ug/dL (ref 111–343)
Vitamin B-12: 405 pg/mL (ref 211–946)
WBC: 4.3 10*3/uL (ref 3.4–10.8)

## 2016-01-03 LAB — CMP14+EGFR
ALK PHOS: 77 IU/L (ref 39–117)
ALT: 16 IU/L (ref 0–44)
AST: 26 IU/L (ref 0–40)
Albumin/Globulin Ratio: 1.7 (ref 1.1–2.5)
Albumin: 4.2 g/dL (ref 3.5–4.8)
BUN/Creatinine Ratio: 12 (ref 10–22)
BUN: 15 mg/dL (ref 8–27)
Bilirubin Total: 0.7 mg/dL (ref 0.0–1.2)
CALCIUM: 9.6 mg/dL (ref 8.6–10.2)
CO2: 23 mmol/L (ref 18–29)
CREATININE: 1.26 mg/dL (ref 0.76–1.27)
Chloride: 99 mmol/L (ref 96–106)
GFR calc Af Amer: 66 mL/min/{1.73_m2} (ref >59)
GFR, EST NON AFRICAN AMERICAN: 57 mL/min/{1.73_m2} — AB (ref >59)
GLOBULIN, TOTAL: 2.5 g/dL (ref 1.5–4.5)
GLUCOSE: 150 mg/dL — AB (ref 65–99)
Potassium: 3.5 mmol/L (ref 3.5–5.2)
Sodium: 140 mmol/L (ref 134–144)
Total Protein: 6.7 g/dL (ref 6.0–8.5)

## 2016-01-11 ENCOUNTER — Telehealth: Payer: Self-pay | Admitting: Oncology

## 2016-01-11 NOTE — Telephone Encounter (Signed)
SPOKE WITH PT AND HE WOULD RATHER GO TO [place] BECAUSE IT CLOSER TO HIS HOME. REFERRED PT TO [facility]

## 2016-01-16 ENCOUNTER — Telehealth: Payer: Self-pay | Admitting: Family

## 2016-01-21 ENCOUNTER — Ambulatory Visit (HOSPITAL_COMMUNITY): Payer: Self-pay | Admitting: Hematology & Oncology

## 2016-02-06 ENCOUNTER — Ambulatory Visit (INDEPENDENT_AMBULATORY_CARE_PROVIDER_SITE_OTHER): Payer: Medicare HMO | Admitting: Cardiology

## 2016-02-06 ENCOUNTER — Encounter: Payer: Self-pay | Admitting: Cardiology

## 2016-02-06 VITALS — BP 132/81 | HR 91 | Ht 67.0 in | Wt 162.0 lb

## 2016-02-06 DIAGNOSIS — R0600 Dyspnea, unspecified: Secondary | ICD-10-CM

## 2016-02-06 DIAGNOSIS — I10 Essential (primary) hypertension: Secondary | ICD-10-CM | POA: Diagnosis not present

## 2016-02-06 NOTE — Progress Notes (Signed)
Cardiology Office Note   Date:  02/06/2016   ID:  Sehaj, Colin [birth year], MRN [id number]  PCP:  Evelina Dun, FNP  Cardiologist:   Minus Breeding, MD   No chief complaint on file.     History of Present Illness: Robert Osborne is a 70 y.o. male who presents for evaluation of dyspnea.  He has no prior cardiac history.  He has had a distant stress test.  He still works in Biomedical scientist and walks for exercise.  One day in Jan he had an episode of being SOB with walking.  He did not have chest pain.  He felt somewhat weak with this.  His symptoms went on for a few minutes and then went away.  He has not had this since then.  He otherwise can be active.  The patient denies any new symptoms such as chest discomfort, neck or arm discomfort. There has been no new shortness of breath, PND or orthopnea. There have been no reported palpitations, presyncope or syncope.  He does have cardiovascular risk factors.   Past Medical History  Diagnosis Date  . Hyperlipidemia   . Diabetes mellitus without complication ([place])   . Hypertension   . Cancer [facility])     papillary urethral carcinoma,low grade, non-invasive    Past Surgical History  Procedure Laterality Date  . Colonoscopy w/ polypectomy       Current Outpatient Prescriptions  Medication Sig Dispense Refill  . aspirin 81 MG tablet Take 81 mg by mouth daily.      Marland Kitchen atorvastatin (LIPITOR) 40 MG tablet Take 1 tablet (40 mg total) by mouth daily. 90 tablet 3  . Cholecalciferol (VITAMIN D-3) 5000 UNITS TABS Take 1,000 Int'l Units by mouth daily.     . cloNIDine (CATAPRES) 0.1 MG tablet Take 1 tablet (0.1 mg total) by mouth daily. 90 tablet 3  . ferrous sulfate 325 (65 FE) MG EC tablet Take by mouth 2 (two) times daily.    Marland Kitchen glipiZIDE (GLUCOTROL) 10 MG tablet Take 1 tablet (10 mg total) by mouth daily. 90 tablet 4  . hydrochlorothiazide (HYDRODIURIL) 25 MG tablet Take 1 tablet (25 mg total) by mouth daily. 90 tablet 3  . losartan  (COZAAR) 100 MG tablet Take 1 tablet (100 mg total) by mouth daily. 90 tablet 3  . metFORMIN (GLUCOPHAGE) 1000 MG tablet Take 1 tablet (1,000 mg total) by mouth 2 (two) times daily. 180 tablet 4  . tadalafil (CIALIS) 20 MG tablet Take 0.5-1 tablets (10-20 mg total) by mouth every other day as needed for erectile dysfunction. 5 tablet 11  . canagliflozin (INVOKANA) 300 MG TABS tablet Take 300 mg by mouth daily before breakfast. 90 tablet 3   No current facility-administered medications for this visit.    Allergies:   Enalapril    Social History:  The patient  reports that he quit smoking about 42 years ago. He does not have any smokeless tobacco history on file.   Family History:  The patient's family history includes Heart attack (age of onset: 89) in his mother.    ROS:  Please see the history of present illness.   Otherwise, review of systems are positive for none.   All other systems are reviewed and negative.    PHYSICAL EXAM: VS:  BP 132/81 mmHg  Pulse 91  Ht 5\' 7"  (1.702 m)  Wt 162 lb (73.483 kg)  BMI 25.37 kg/m2 , BMI Body mass index is 25.37 kg/(m^2). GENERAL:  Well appearing HEENT:  Pupils equal round and reactive, fundi not visualized, oral mucosa unremarkable NECK:  No jugular venous distention, waveform within normal limits, carotid upstroke brisk and symmetric, no bruits, no thyromegaly LYMPHATICS:  No cervical, inguinal adenopathy LUNGS:  Clear to auscultation bilaterally BACK:  No CVA tenderness CHEST:  Unremarkable HEART:  PMI not displaced or sustained,S1 and S2 within normal limits, no S3, no S4, no clicks, no rubs, no murmurs ABD:  Flat, positive bowel sounds normal in frequency in pitch, no bruits, no rebound, no guarding, no midline pulsatile mass, no hepatomegaly, no splenomegaly EXT:  2 plus pulses throughout, no edema, no cyanosis no clubbing SKIN:  No rashes no nodules NEURO:  Cranial nerves II through XII grossly intact, motor grossly intact  throughout PSYCH:  Cognitively intact, oriented to person place and time    EKG:  EKG is not ordered today. The ekg ordered 01/02/16 demonstrates NSR, rate 76, axis WNL, intervals WNL, no acute ST T wave changes   Recent Labs: 05/09/2015: Magnesium 1.8 01/02/2016: ALT 16; BUN 15; Creatinine, Ser 1.26; Platelets 244; Potassium 3.5; Sodium 140    Lipid Panel    Component Value Date/Time   CHOL 148 08/10/2015 1629   CHOL 136 02/21/2014 1632   CHOL 125 05/17/2013 1513   TRIG 127 08/10/2015 1629   TRIG 139 02/21/2014 1632   TRIG 125 05/17/2013 1513   HDL 50 08/10/2015 1629   HDL 47 02/21/2014 1632   HDL 39* 05/17/2013 1513   CHOLHDL 3.0 08/10/2015 1629   LDLCALC 73 08/10/2015 1629   LDLCALC 61 02/21/2014 1632   LDLCALC 61 05/17/2013 1513      Wt Readings from Last 3 Encounters:  02/06/16 162 lb (73.483 kg)  01/02/16 161 lb (73.029 kg)  08/24/15 156 lb (70.761 kg)      Other studies Reviewed: Additional studies/ records that were reviewed today include: Office records. Review of the above records demonstrates:  Please see elsewhere in the note.     ASSESSMENT AND PLAN:  DYSPNEA:  He has cardiovascular risk factors.  The pretest probability of CAD is somewhat low.  However, he needs stress testing. I will bring the patient back for a POET (Plain Old Exercise Test). This will allow me to screen for obstructive coronary disease, risk stratify and very importantly provide a prescription for exercise.  HTN:  The blood pressure is at target. No change in medications is indicated. We will continue with therapeutic lifestyle changes (TLC).  DYSLIPIDEMIA:  His lipid profile last year was good.  He will continue meds as listed.   DM:    He will continue meds as listed.  Lab Results  Component Value Date   HGBA1C 6.7 08/10/2015       Current medicines are reviewed at length with the patient today.  The patient does not have concerns regarding medicines.  The following  changes have been made:  no change  Labs/ tests ordered today include:   Orders Placed This Encounter  Procedures  . Exercise Tolerance Test     Disposition:   FU with me as needed.      Signed, Minus Breeding, MD  02/06/2016 5:05 PM    [place]

## 2016-02-06 NOTE — Patient Instructions (Addendum)
Medication Instructions:  The current medical regimen is effective;  continue present plan and medications.  Testing/Procedures: Your physician has requested that you have an exercise tolerance test. Please check in at the Main Entrance of [facility] at 9:15 am Monday February 18, 2016.  Come prepared to exercise in 2 piece comfortable clothing and walking shoes.  Medications are OK to take this day.  Follow-Up: Follow up as needed with Dr Percival Spanish.  If you need a refill on your cardiac medications before your next appointment, please call your pharmacy.  Thank you for choosing [facility]!!

## 2016-02-07 ENCOUNTER — Encounter: Payer: Self-pay | Admitting: Cardiology

## 2016-02-18 ENCOUNTER — Ambulatory Visit (HOSPITAL_COMMUNITY)
Admission: RE | Admit: 2016-02-18 | Discharge: 2016-02-18 | Disposition: A | Discharge status: Home / Self Care | Payer: Medicare HMO | Source: Ambulatory Visit | Attending: Cardiology | Admitting: Cardiology

## 2016-02-18 DIAGNOSIS — R0600 Dyspnea, unspecified: Secondary | ICD-10-CM | POA: Insufficient documentation

## 2016-02-18 DIAGNOSIS — I10 Essential (primary) hypertension: Secondary | ICD-10-CM | POA: Diagnosis not present

## 2016-02-18 LAB — EXERCISE TOLERANCE TEST
CSEPED: 6 min
CSEPEW: 8.3 METS
CSEPPHR: 139 {beats}/min
Exercise duration (sec): 26 s
MPHR: 148 {beats}/min
Percent HR: 93 %
RPE: 11
Rest HR: 74 {beats}/min

## 2016-03-13 ENCOUNTER — Encounter: Payer: Self-pay | Admitting: *Deleted

## 2016-04-10 ENCOUNTER — Telehealth: Payer: Self-pay | Admitting: Family

## 2016-05-13 ENCOUNTER — Telehealth: Payer: Self-pay | Admitting: Family

## 2016-05-13 NOTE — Telephone Encounter (Signed)
noted 

## 2016-06-02 ENCOUNTER — Ambulatory Visit (INDEPENDENT_AMBULATORY_CARE_PROVIDER_SITE_OTHER): Payer: Medicare HMO | Admitting: Family

## 2016-06-02 ENCOUNTER — Encounter: Payer: Self-pay | Admitting: Family

## 2016-06-02 VITALS — BP 146/79 | HR 58 | Temp 97.6°F | Ht 67.0 in | Wt 168.0 lb

## 2016-06-02 DIAGNOSIS — Z1211 Encounter for screening for malignant neoplasm of colon: Secondary | ICD-10-CM

## 2016-06-02 DIAGNOSIS — Z1159 Encounter for screening for other viral diseases: Secondary | ICD-10-CM

## 2016-06-02 DIAGNOSIS — Z0000 Encounter for general adult medical examination without abnormal findings: Secondary | ICD-10-CM

## 2016-06-02 DIAGNOSIS — I10 Essential (primary) hypertension: Secondary | ICD-10-CM | POA: Diagnosis not present

## 2016-06-02 DIAGNOSIS — E785 Hyperlipidemia, unspecified: Secondary | ICD-10-CM

## 2016-06-02 DIAGNOSIS — E559 Vitamin D deficiency, unspecified: Secondary | ICD-10-CM | POA: Diagnosis not present

## 2016-06-02 DIAGNOSIS — Z23 Encounter for immunization: Secondary | ICD-10-CM | POA: Diagnosis not present

## 2016-06-02 DIAGNOSIS — C679 Malignant neoplasm of bladder, unspecified: Secondary | ICD-10-CM

## 2016-06-02 DIAGNOSIS — E663 Overweight: Secondary | ICD-10-CM | POA: Diagnosis not present

## 2016-06-02 DIAGNOSIS — E119 Type 2 diabetes mellitus without complications: Secondary | ICD-10-CM

## 2016-06-02 LAB — BAYER DCA HB A1C WAIVED: HB A1C (BAYER DCA - WAIVED): 7.2 % — ABNORMAL HIGH (ref <7.0)

## 2016-06-02 MED ORDER — SILDENAFIL CITRATE 100 MG PO TABS: 50.0000 mg | ORAL_TABLET | Freq: Every day | ORAL | Status: DC | PRN

## 2016-06-02 NOTE — Addendum Note (Signed)
Addended by: Shelbie Ammons on: 06/02/2016 04:59 PM   Modules accepted: Orders

## 2016-06-02 NOTE — Patient Instructions (Signed)

## 2016-06-02 NOTE — Progress Notes (Signed)
Subjective:   Robert Osborne is a 70 y.o. male who presents for Medicare Annual/Subsequent preventive examination.  Review of Systems:  Negative        Objective:    Vitals: BP 146/79 mmHg  Pulse 58  Temp(Src) 97.6 F (36.4 C) (Oral)  Ht 5\' 7"  (1.702 m)  Wt 168 lb (76.204 kg)  BMI 26.31 kg/m2  Body mass index is 26.31 kg/(m^2).  Tobacco History  Smoking status  . Former Smoker  . Quit date: 05/17/1973  Smokeless tobacco  . Not on file     Counseling given: Not Answered   Past Medical History  Diagnosis Date  . Hyperlipidemia   . Diabetes mellitus without complication ([place])   . Hypertension   . Cancer [facility])     papillary urethral carcinoma,low grade, non-invasive   Past Surgical History  Procedure Laterality Date  . Colonoscopy w/ polypectomy     Family History  Problem Relation Age of Onset  . Heart attack Mother 67   History  Sexual Activity  . Sexual Activity: Not on file    Outpatient Encounter Prescriptions as of 06/02/2016  Medication Sig  . aspirin 81 MG tablet Take 81 mg by mouth daily.    Marland Kitchen atorvastatin (LIPITOR) 40 MG tablet Take 1 tablet (40 mg total) by mouth daily.  . canagliflozin (INVOKANA) 300 MG TABS tablet Take 300 mg by mouth daily before breakfast.  . Cholecalciferol (VITAMIN D-3) 5000 UNITS TABS Take 1,000 Int'l Units by mouth daily.   . ferrous sulfate 325 (65 FE) MG EC tablet Take by mouth 2 (two) times daily.  Marland Kitchen glipiZIDE (GLUCOTROL) 10 MG tablet Take 1 tablet (10 mg total) by mouth daily.  . hydrochlorothiazide (HYDRODIURIL) 25 MG tablet Take 1 tablet (25 mg total) by mouth daily.  Marland Kitchen losartan (COZAAR) 100 MG tablet Take 1 tablet (100 mg total) by mouth daily.  . metFORMIN (GLUCOPHAGE) 1000 MG tablet Take 1 tablet (1,000 mg total) by mouth 2 (two) times daily.  . tadalafil (CIALIS) 20 MG tablet Take 0.5-1 tablets (10-20 mg total) by mouth every other day as needed for erectile dysfunction.  . [DISCONTINUED] cloNIDine  (CATAPRES) 0.1 MG tablet Take 1 tablet (0.1 mg total) by mouth daily. (Patient not taking: Reported on 06/02/2016)   No facility-administered encounter medications on file as of 06/02/2016.    Activities of Daily Living In your present state of health, do you have any difficulty performing the following activities: 06/02/2016 08/10/2015  Hearing? N N  Vision? Y N  Difficulty concentrating or making decisions? N N  Walking or climbing stairs? N N  Dressing or bathing? N N  Doing errands, shopping? N N    Patient Care Team: Sharion Balloon, FNP as PCP - General (Nurse Practitioner)   Assessment:     Exercise Activities and Dietary recommendations  Exercise 3 times a week, increase fruits and vegatables    Goals    None     Fall Risk Fall Risk  06/02/2016 08/10/2015 04/13/2015 01/04/2015 06/30/2014  Falls in the past year? No No No No No   Depression Screen PHQ 2/9 Scores 06/02/2016 08/10/2015 04/13/2015 01/04/2015  PHQ - 2 Score 0 0 0 0    Cognitive Testing MMSE - Mini Mental State Exam 06/02/2016  Orientation to time 5  Orientation to Place 4  Registration 3  Attention/ Calculation 0  Recall 2  Language- name 2 objects 2  Language- repeat 1  Language- follow 3 step  command 3  Language- read & follow direction 1  Write a sentence 1  Copy design 1    Immunization History  Administered Date(s) Administered  . Influenza Whole 08/15/2010  . Influenza,inj,Quad PF,36+ Mos 09/23/2014  . Influenza-Unspecified 11/17/2013, 10/04/2015  . Pneumococcal Conjugate-13 11/17/2013   Screening Tests Health Maintenance  Topic Date Due  . TETANUS/TDAP  01/26/1963  . ZOSTAVAX  01/27/2004  . PNA vac Low Risk Adult (2 of 2 - PPSV23) 11/17/2014  . OPHTHALMOLOGY EXAM  09/02/2016 (Originally 10/05/2015)  . COLONOSCOPY  09/02/2016 (Originally 03/12/2014)  . INFLUENZA VACCINE  07/15/2016  . HEMOGLOBIN A1C  12/02/2016  . FOOT EXAM  06/02/2017  . Hepatitis C Screening  Completed     BP 146/79  mmHg  Pulse 58  Temp(Src) 97.6 F (36.4 C) (Oral)  Ht 5\' 7"  (1.702 m)  Wt 168 lb (76.204 kg)  BMI 26.31 kg/m2  Plan:    During the course of the visit the patient was educated and counseled about the following appropriate screening and preventive services:   Vaccines to include Pneumoccal, Influenza, Hepatitis B, Td, Zostavax, HCV  Electrocardiogram  Cardiovascular Disease  Colorectal cancer screening  Diabetes screening  Prostate Cancer Screening  Glaucoma screening  Nutrition counseling   Smoking cessation counseling  Patient Instructions (the written plan) was given to the patient.    Evelina Dun, [place]  06/02/2016

## 2016-06-03 LAB — CMP14+EGFR
ALBUMIN: 3.8 g/dL (ref 3.5–4.8)
ALK PHOS: 66 IU/L (ref 39–117)
ALT: 11 IU/L (ref 0–44)
AST: 23 IU/L (ref 0–40)
Albumin/Globulin Ratio: 1.5 (ref 1.2–2.2)
BILIRUBIN TOTAL: 1.1 mg/dL (ref 0.0–1.2)
BUN / CREAT RATIO: 12 (ref 10–24)
BUN: 13 mg/dL (ref 8–27)
CHLORIDE: 105 mmol/L (ref 96–106)
CO2: 24 mmol/L (ref 18–29)
Calcium: 9.1 mg/dL (ref 8.6–10.2)
Creatinine, Ser: 1.08 mg/dL (ref 0.76–1.27)
GFR calc Af Amer: 79 mL/min/{1.73_m2} (ref >59)
GFR calc non Af Amer: 68 mL/min/{1.73_m2} (ref >59)
GLUCOSE: 74 mg/dL (ref 65–99)
Globulin, Total: 2.5 g/dL (ref 1.5–4.5)
Potassium: 3.7 mmol/L (ref 3.5–5.2)
Sodium: 147 mmol/L — ABNORMAL HIGH (ref 134–144)
Total Protein: 6.3 g/dL (ref 6.0–8.5)

## 2016-06-03 LAB — LIPID PANEL
CHOLESTEROL TOTAL: 149 mg/dL (ref 100–199)
Chol/HDL Ratio: 3.7 ratio units (ref 0.0–5.0)
HDL: 40 mg/dL (ref >39)
LDL Calculated: 85 mg/dL (ref 0–99)
Triglycerides: 118 mg/dL (ref 0–149)
VLDL CHOLESTEROL CAL: 24 mg/dL (ref 5–40)

## 2016-06-03 LAB — HEPATITIS C ANTIBODY: Hep C Virus Ab: <0.1 s/co ratio (ref 0.0–0.9)

## 2016-08-12 ENCOUNTER — Other Ambulatory Visit: Payer: Self-pay | Admitting: Family

## 2016-08-12 DIAGNOSIS — E785 Hyperlipidemia, unspecified: Secondary | ICD-10-CM

## 2016-08-25 ENCOUNTER — Telehealth: Payer: Self-pay | Admitting: Family

## 2016-08-26 ENCOUNTER — Other Ambulatory Visit: Payer: Self-pay | Admitting: Family

## 2016-08-26 DIAGNOSIS — I10 Essential (primary) hypertension: Secondary | ICD-10-CM

## 2016-08-26 DIAGNOSIS — E119 Type 2 diabetes mellitus without complications: Secondary | ICD-10-CM

## 2016-09-01 DIAGNOSIS — Z23 Encounter for immunization: Secondary | ICD-10-CM | POA: Diagnosis not present

## 2016-09-02 ENCOUNTER — Ambulatory Visit: Payer: Medicare HMO

## 2016-09-15 ENCOUNTER — Other Ambulatory Visit: Payer: Self-pay | Admitting: Family

## 2016-09-15 DIAGNOSIS — I10 Essential (primary) hypertension: Secondary | ICD-10-CM

## 2016-09-29 ENCOUNTER — Other Ambulatory Visit: Payer: Self-pay | Admitting: Family

## 2016-09-29 DIAGNOSIS — I10 Essential (primary) hypertension: Secondary | ICD-10-CM

## 2016-10-24 ENCOUNTER — Ambulatory Visit (INDEPENDENT_AMBULATORY_CARE_PROVIDER_SITE_OTHER): Payer: Medicare HMO | Admitting: Family

## 2016-10-24 ENCOUNTER — Encounter: Payer: Self-pay | Admitting: Family

## 2016-10-24 VITALS — BP 130/90 | HR 99 | Temp 97.2°F | Ht 67.0 in | Wt 158.4 lb

## 2016-10-24 DIAGNOSIS — E785 Hyperlipidemia, unspecified: Secondary | ICD-10-CM

## 2016-10-24 DIAGNOSIS — R195 Other fecal abnormalities: Secondary | ICD-10-CM

## 2016-10-24 DIAGNOSIS — E119 Type 2 diabetes mellitus without complications: Secondary | ICD-10-CM

## 2016-10-24 DIAGNOSIS — E559 Vitamin D deficiency, unspecified: Secondary | ICD-10-CM | POA: Diagnosis not present

## 2016-10-24 DIAGNOSIS — J301 Allergic rhinitis due to pollen: Secondary | ICD-10-CM

## 2016-10-24 DIAGNOSIS — Z1211 Encounter for screening for malignant neoplasm of colon: Secondary | ICD-10-CM

## 2016-10-24 DIAGNOSIS — N529 Male erectile dysfunction, unspecified: Secondary | ICD-10-CM | POA: Diagnosis not present

## 2016-10-24 DIAGNOSIS — I10 Essential (primary) hypertension: Secondary | ICD-10-CM | POA: Diagnosis not present

## 2016-10-24 HISTORY — DX: Male erectile dysfunction, unspecified: N52.9

## 2016-10-24 LAB — BAYER DCA HB A1C WAIVED: HB A1C (BAYER DCA - WAIVED): 7.3 % — ABNORMAL HIGH (ref <7.0)

## 2016-10-24 MED ORDER — FLUTICASONE PROPIONATE 50 MCG/ACT NA SUSP: 2.0000 | Freq: Every day | NASAL | 6 refills | Status: DC

## 2016-10-24 NOTE — Patient Instructions (Signed)

## 2016-10-24 NOTE — Addendum Note (Signed)
Addended by: Earlene Plater on: 10/24/2016 10:19 AM   Modules accepted: Orders

## 2016-10-24 NOTE — Progress Notes (Signed)
Subjective:    Patient ID: Robert Osborne, male    DOB: 01-03-1946, 70 y.o.   MRN: [id number]  Pt presents to the office today for chronic follow up. PT remarried in August and is very happy at this time.  Diabetes  He presents for his follow-up diabetic visit. He has type 2 diabetes mellitus. His disease course has been improving. Pertinent negatives for hypoglycemia include no confusion, dizziness or headaches. Associated symptoms include visual change. Pertinent negatives for diabetes include no blurred vision, no fatigue, no foot paresthesias and no foot ulcerations. Pertinent negatives for hypoglycemia complications include no blackouts. Symptoms are improving. Pertinent negatives for diabetic complications include no CVA, heart disease, nephropathy or peripheral neuropathy. Risk factors for coronary artery disease include diabetes mellitus, dyslipidemia, hypertension, male sex, sedentary lifestyle and family history. Current diabetic treatment includes oral agent (triple therapy). He is compliant with treatment all of the time. He is following a generally healthy diet. (Pt states he has not been checking them ) An ACE inhibitor/angiotensin II receptor blocker is being taken. Eye exam is current.  Hypertension  This is a chronic problem. The current episode started more than 1 year ago. The problem has been resolved since onset. The problem is controlled. Pertinent negatives include no anxiety, blurred vision, headaches, palpitations, peripheral edema or shortness of breath. Risk factors for coronary artery disease include diabetes mellitus, dyslipidemia, male gender and family history. Past treatments include diuretics and direct vasodilators. The current treatment provides moderate improvement. There is no history of kidney disease, CAD/MI, CVA, heart failure or a thyroid problem. There is no history of sleep apnea.  Hyperlipidemia  This is a chronic problem. The current episode started more  than 1 year ago. The problem is controlled. Recent lipid tests were reviewed and are normal. Exacerbating diseases include diabetes. He has no history of hypothyroidism. Factors aggravating his hyperlipidemia include fatty foods. Pertinent negatives include no focal weakness, leg pain, myalgias or shortness of breath. Current antihyperlipidemic treatment includes statins. The current treatment provides moderate improvement of lipids. Risk factors for coronary artery disease include diabetes mellitus, dyslipidemia, hypertension, male sex and family history.  Erectile Dysfunction  This is a chronic problem. The current episode started more than 1 month ago. The problem is unchanged. The nature of his difficulty is achieving erection and maintaining erection. Irritative symptoms include nocturia.      Review of Systems  Constitutional: Negative.  Negative for fatigue.  HENT: Negative.   Eyes: Negative for blurred vision.  Respiratory: Negative.  Negative for shortness of breath.   Cardiovascular: Negative.  Negative for palpitations.  Gastrointestinal: Negative.   Endocrine: Negative.   Genitourinary: Positive for nocturia.  Musculoskeletal: Negative.  Negative for myalgias.  Neurological: Negative.  Negative for dizziness, focal weakness and headaches.  Hematological: Negative.   Psychiatric/Behavioral: Negative.  Negative for confusion.  All other systems reviewed and are negative.      Objective:   Physical Exam  Constitutional: He is oriented to person, place, and time. He appears well-developed and well-nourished. No distress.  HENT:  Head: Normocephalic.  Right Ear: External ear normal.  Left Ear: External ear normal.  Nose: Nose normal.  Mouth/Throat: Oropharynx is clear and moist.  Eyes: Pupils are equal, round, and reactive to light. Right eye exhibits no discharge. Left eye exhibits no discharge.  Neck: Normal range of motion. Neck supple. No thyromegaly present.    Cardiovascular: Normal rate, regular rhythm, normal heart sounds and intact distal pulses.  No murmur heard. Pulmonary/Chest: Effort normal and breath sounds normal. No respiratory distress. He has no wheezes.  Abdominal: Soft. Bowel sounds are normal. He exhibits no distension. There is no tenderness.  Musculoskeletal: Normal range of motion. He exhibits no edema or tenderness.  Neurological: He is alert and oriented to person, place, and time. He has normal reflexes. No cranial nerve deficit.  Skin: Skin is warm and dry. No rash noted. No erythema.  Psychiatric: He has a normal mood and affect. His behavior is normal. Judgment and thought content normal.  Vitals reviewed.   BP 130/90   Pulse 99   Temp 97.2 F (36.2 C) (Oral)   Ht _0  (1.702 m)   Wt 158 lb 6.4 oz (71.8 kg)   BMI 24.81 kg/m       Assessment & Plan:  1. Essential hypertension - CMP14+EGFR  2. Diabetes mellitus without complication (HCC) - Bayer DCA Hb A1c Waived - CMP14+EGFR - Microalbumin / creatinine urine ratio  3. Hyperlipidemia, unspecified hyperlipidemia type - CMP14+EGFR - Lipid panel  4. Vitamin D deficiency - CMP14+EGFR  5. Erectile dysfunction, unspecified erectile dysfunction type  6. Colon cancer screening - Fecal occult blood, imunochemical; Future  7. Allergic rhinitis due to pollen, unspecified chronicity, unspecified seasonality - fluticasone (FLONASE) 50 MCG/ACT nasal spray; Place 2 sprays into both nostrils daily.  Dispense: 16 g; Refill: 6   Continue all meds Labs pending Health Maintenance reviewed Diet and exercise encouraged RTO 6 months  Evelina Dun, FNP

## 2016-10-25 LAB — CMP14+EGFR
ALBUMIN: 4.5 g/dL (ref 3.5–4.8)
ALT: 23 IU/L (ref 0–44)
AST: 28 IU/L (ref 0–40)
Albumin/Globulin Ratio: 1.7 (ref 1.2–2.2)
Alkaline Phosphatase: 98 IU/L (ref 39–117)
BILIRUBIN TOTAL: 0.9 mg/dL (ref 0.0–1.2)
BUN / CREAT RATIO: 12 (ref 10–24)
BUN: 14 mg/dL (ref 8–27)
CO2: 28 mmol/L (ref 18–29)
CREATININE: 1.21 mg/dL (ref 0.76–1.27)
Calcium: 9.9 mg/dL (ref 8.6–10.2)
Chloride: 94 mmol/L — ABNORMAL LOW (ref 96–106)
GFR, EST AFRICAN AMERICAN: 69 mL/min/{1.73_m2} (ref >59)
GFR, EST NON AFRICAN AMERICAN: 59 mL/min/{1.73_m2} — AB (ref >59)
GLUCOSE: 184 mg/dL — AB (ref 65–99)
Globulin, Total: 2.7 g/dL (ref 1.5–4.5)
Potassium: 3.5 mmol/L (ref 3.5–5.2)
Sodium: 139 mmol/L (ref 134–144)
Total Protein: 7.2 g/dL (ref 6.0–8.5)

## 2016-10-25 LAB — LIPID PANEL
CHOL/HDL RATIO: 2.8 ratio (ref 0.0–5.0)
Cholesterol, Total: 116 mg/dL (ref 100–199)
HDL: 42 mg/dL (ref >39)
LDL CALC: 43 mg/dL (ref 0–99)
Triglycerides: 156 mg/dL — ABNORMAL HIGH (ref 0–149)
VLDL CHOLESTEROL CAL: 31 mg/dL (ref 5–40)

## 2016-10-25 LAB — MICROALBUMIN / CREATININE URINE RATIO
Creatinine, Urine: 134.5 mg/dL
Microalb/Creat Ratio: 20.7 mg/g creat (ref 0.0–30.0)
Microalbumin, Urine: 27.9 ug/mL

## 2016-10-26 LAB — FECAL OCCULT BLOOD, IMMUNOCHEMICAL: Fecal Occult Bld: POSITIVE — AB

## 2016-10-29 NOTE — Addendum Note (Signed)
Addended by: Shelbie Ammons on: 10/29/2016 08:46 AM   Modules accepted: Orders

## 2016-10-31 ENCOUNTER — Other Ambulatory Visit: Payer: Medicare HMO

## 2016-10-31 DIAGNOSIS — R195 Other fecal abnormalities: Secondary | ICD-10-CM

## 2016-10-31 DIAGNOSIS — Z1211 Encounter for screening for malignant neoplasm of colon: Secondary | ICD-10-CM

## 2016-10-31 LAB — CBC WITH DIFFERENTIAL/PLATELET
BASOS: 0 %
Basophils Absolute: 0 10*3/uL (ref 0.0–0.2)
EOS (ABSOLUTE): 0 10*3/uL (ref 0.0–0.4)
Eos: 0 %
Hematocrit: 42.9 % (ref 37.5–51.0)
Hemoglobin: 14.4 g/dL (ref 12.6–17.7)
Immature Grans (Abs): 0 10*3/uL (ref 0.0–0.1)
Immature Granulocytes: 0 %
Lymphocytes Absolute: 1.2 10*3/uL (ref 0.7–3.1)
Lymphs: 21 %
MCH: 29.3 pg (ref 26.6–33.0)
MCHC: 33.6 g/dL (ref 31.5–35.7)
MCV: 87 fL (ref 79–97)
MONOS ABS: 0.6 10*3/uL (ref 0.1–0.9)
Monocytes: 11 %
NEUTROS ABS: 3.9 10*3/uL (ref 1.4–7.0)
Neutrophils: 68 %
PLATELETS: 303 10*3/uL (ref 150–379)
RBC: 4.91 x10E6/uL (ref 4.14–5.80)
RDW: 13.2 % (ref 12.3–15.4)
WBC: 5.7 10*3/uL (ref 3.4–10.8)

## 2016-11-07 ENCOUNTER — Encounter: Payer: Self-pay | Admitting: *Deleted

## 2016-12-12 ENCOUNTER — Other Ambulatory Visit: Payer: Self-pay | Admitting: Family

## 2016-12-12 DIAGNOSIS — I10 Essential (primary) hypertension: Secondary | ICD-10-CM

## 2016-12-12 DIAGNOSIS — E119 Type 2 diabetes mellitus without complications: Secondary | ICD-10-CM

## 2017-01-05 ENCOUNTER — Ambulatory Visit (INDEPENDENT_AMBULATORY_CARE_PROVIDER_SITE_OTHER): Payer: Medicare HMO | Admitting: Family

## 2017-01-05 ENCOUNTER — Ambulatory Visit (INDEPENDENT_AMBULATORY_CARE_PROVIDER_SITE_OTHER): Payer: Medicare HMO

## 2017-01-05 ENCOUNTER — Encounter: Payer: Self-pay | Admitting: Family

## 2017-01-05 VITALS — BP 124/80 | HR 92 | Temp 98.7°F | Ht 67.0 in | Wt 157.8 lb

## 2017-01-05 DIAGNOSIS — J209 Acute bronchitis, unspecified: Secondary | ICD-10-CM | POA: Diagnosis not present

## 2017-01-05 DIAGNOSIS — R059 Cough, unspecified: Secondary | ICD-10-CM

## 2017-01-05 DIAGNOSIS — R05 Cough: Secondary | ICD-10-CM

## 2017-01-05 DIAGNOSIS — R062 Wheezing: Secondary | ICD-10-CM

## 2017-01-05 IMAGING — DX DG CHEST 2V
2 series · 2 of 2 positions shown · non-contrast
Comparison: [DATE]

CLINICAL DATA: Cough.

EXAM:
CHEST  2 VIEW

[chest pa]
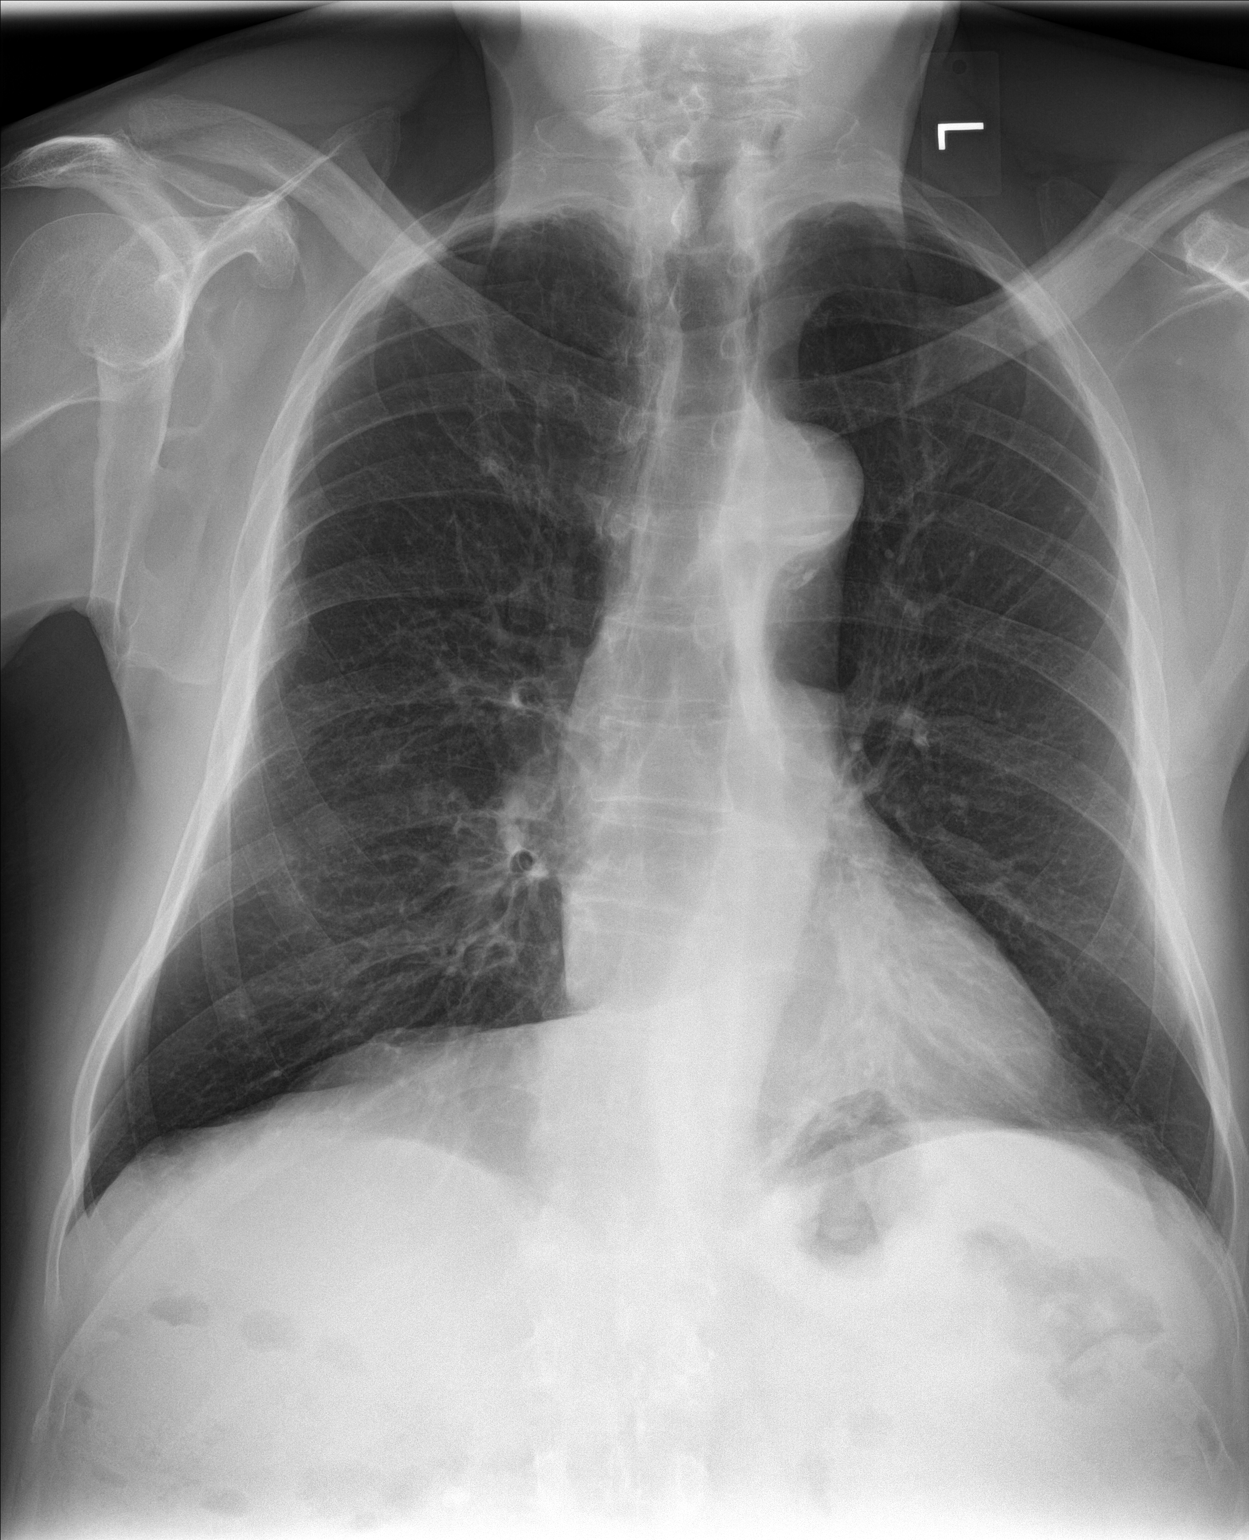

[chest lat]
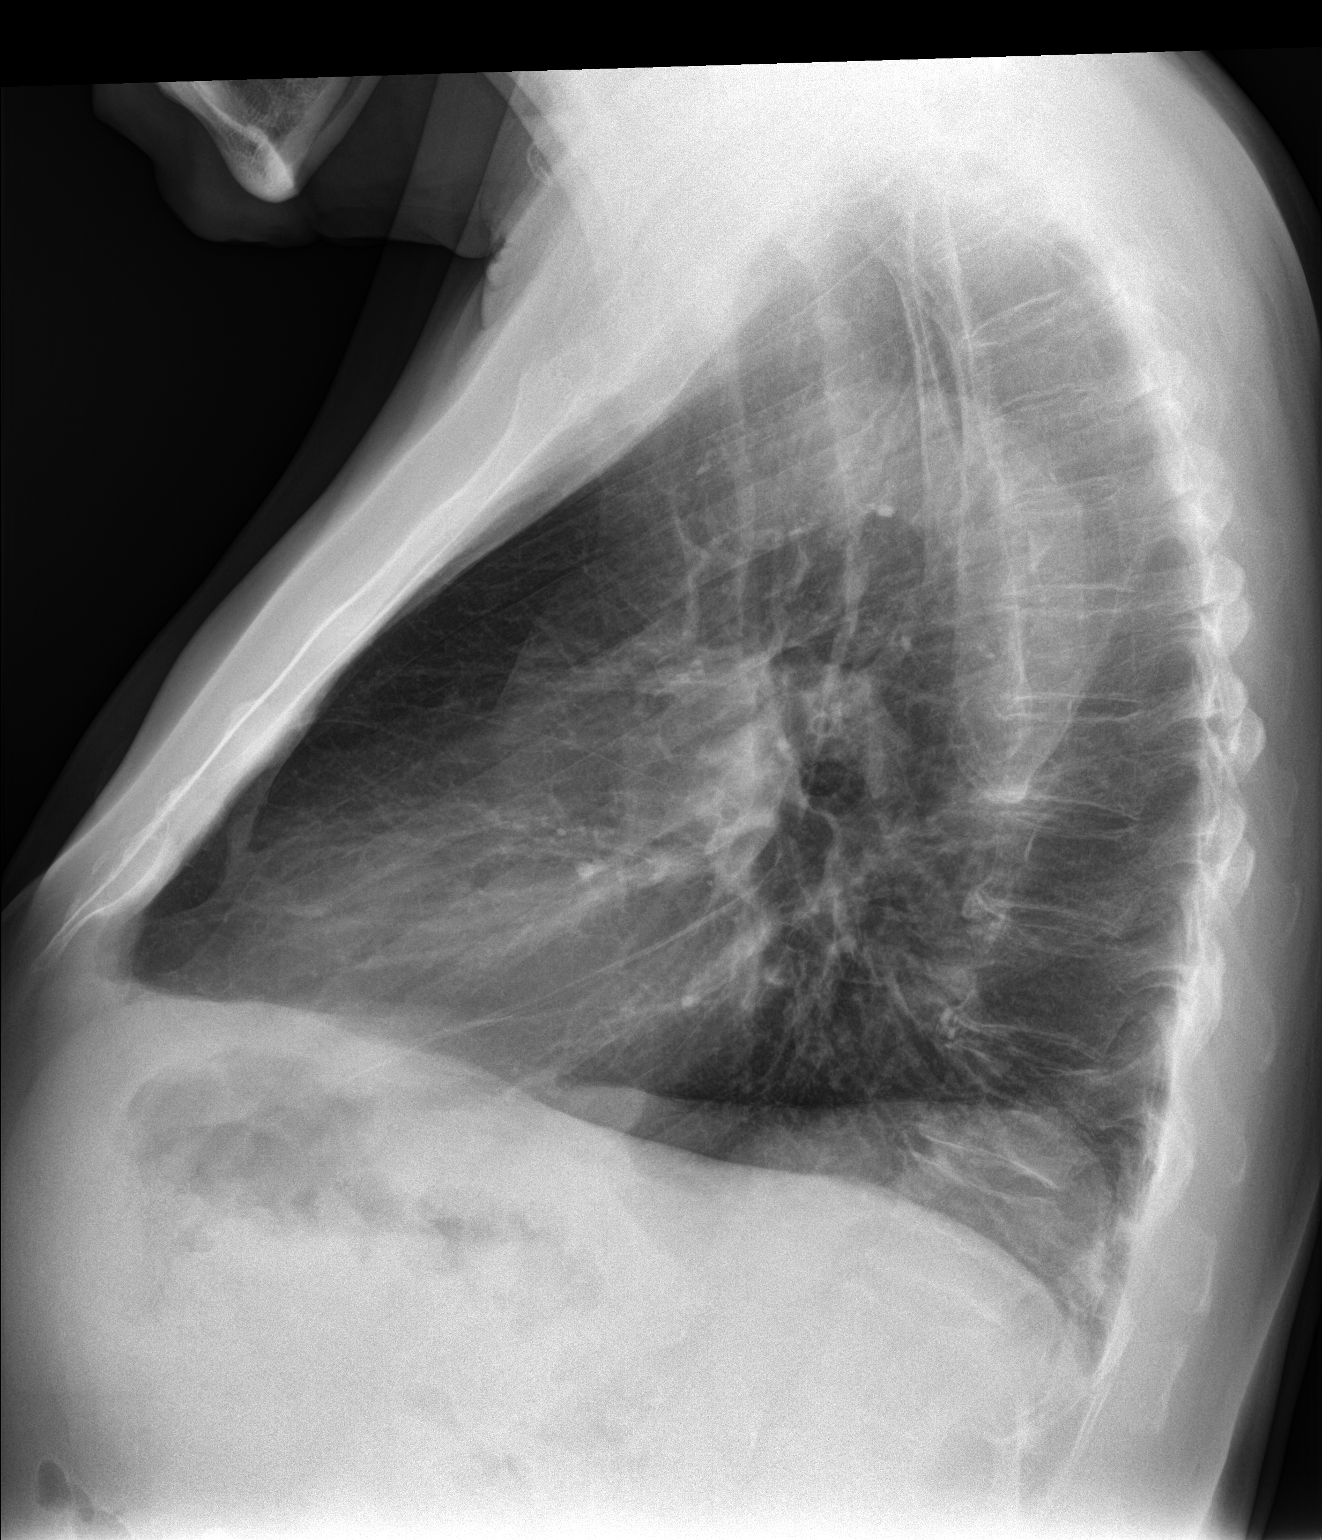

[2 of 2 positions shown; findings below may reference images not displayed]

FINDINGS: Cardiac silhouette is normal in size. No mediastinal or hilar
masses. No evidence of adenopathy.

There is streaky opacity in the medial left lower lobe which is new
from the prior exam. This suggests a combination of bronchial wall
thickening/inflammation and subsegmental atelectasis. This there is
mild stable scarring in the upper lobes at the apices. Lungs are
otherwise clear.

No pleural effusion or pneumothorax.

Skeletal structures are demineralized but intact.
IMPRESSION: 1. Left lower lobe streaky opacity consistent with combination of
bronchitis and subsegmental atelectasis. No other evidence of acute
cardiopulmonary disease.

## 2017-01-05 MED ORDER — DOXYCYCLINE HYCLATE 100 MG PO TABS: 100.0000 mg | ORAL_TABLET | Freq: Two times a day (BID) | ORAL | 0 refills | Status: DC

## 2017-01-05 MED ORDER — PREDNISONE 10 MG (21) PO TBPK: ORAL_TABLET | ORAL | 0 refills | Status: DC

## 2017-01-05 NOTE — Patient Instructions (Signed)

## 2017-01-05 NOTE — Progress Notes (Addendum)
Subjective:    Patient ID: Robert Osborne, male    DOB: Feb 22, 1946, 71 y.o.   MRN: [id number]  Sinus Problem  Associated symptoms include coughing and a sore throat. Pertinent negatives include no chills, ear pain, headaches or shortness of breath.  Cough  This is a new problem. The current episode started in the past 7 days. The problem has been gradually worsening. The problem occurs every few minutes. The cough is productive of sputum and productive of [place] sputum. Associated symptoms include myalgias, postnasal drip, rhinorrhea, a sore throat and wheezing. Pertinent negatives include no chills, ear congestion, ear pain, fever, headaches, nasal congestion or shortness of breath. The symptoms are aggravated by lying down. He has tried rest and OTC cough suppressant for the symptoms. The treatment provided mild relief.      Review of Systems  Constitutional: Negative for chills and fever.  HENT: Positive for postnasal drip, rhinorrhea and sore throat. Negative for ear pain.   Respiratory: Positive for cough and wheezing. Negative for shortness of breath.   Musculoskeletal: Positive for myalgias.  Neurological: Negative for headaches.  All other systems reviewed and are negative.      Objective:   Physical Exam  Constitutional: He is oriented to person, place, and time. He appears well-developed and well-nourished. No distress.  HENT:  Head: Normocephalic.  Right Ear: External ear normal.  Left Ear: External ear normal.  Mouth/Throat: Oropharynx is clear and moist.  Eyes: Pupils are equal, round, and reactive to light. Right eye exhibits no discharge. Left eye exhibits no discharge.  Neck: Normal range of motion. Neck supple. No thyromegaly present.  Cardiovascular: Normal rate, regular rhythm, normal heart sounds and intact distal pulses.   No murmur heard. Pulmonary/Chest: Effort normal. No respiratory distress. He has wheezes in the right middle field, the right lower field,  the left middle field and the left lower field.  Abdominal: Soft. Bowel sounds are normal. He exhibits no distension. There is no tenderness.  Musculoskeletal: Normal range of motion. He exhibits no edema or tenderness.  Neurological: He is alert and oriented to person, place, and time. He has normal reflexes. No cranial nerve deficit.  Skin: Skin is warm and dry. No rash noted. No erythema.  Psychiatric: He has a normal mood and affect. His behavior is normal. Judgment and thought content normal.  Vitals reviewed.  Chest x-ray- No pneumonia noted. Preliminary reading by Evelina Dun, FNP [facility]    BP 124/80   Pulse 92   Temp 98.7 F (37.1 C) (Oral)   Ht 5\' 7"  (1.702 m)   Wt 157 lb 12.8 oz (71.6 kg)   BMI 24.71 kg/m      Assessment & Plan:  1. Wheezing - DG Chest 2 View; Future  2. Cough - DG Chest 2 View; Future  3. Acute bronchitis, unspecified organism - Take meds as prescribed - Use a cool mist humidifier  -Use saline nose sprays frequently -Saline irrigations of the nose can be very helpful if done frequently.  * 4X daily for 1 week*  * Use of a nettie pot can be helpful with this. Follow directions with this* -Force fluids -For any cough or congestion  Use plain Mucinex- regular strength or max strength is fine   * Children- consult with Pharmacist for dosing -For fever or aces or pains- take tylenol or ibuprofen appropriate for age and weight.  * for fevers greater than 101 orally you may alternate ibuprofen and tylenol every  3 hours. -Throat lozenges if help - doxycycline (VIBRA-TABS) 100 MG tablet; Take 1 tablet (100 mg total) by mouth 2 (two) times daily.  Dispense: 20 tablet; Refill: 0 - predniSONE (STERAPRED UNI-PAK 21 TAB) 10 MG (21) TBPK tablet; Use as directed  Dispense: 21 tablet; Refill: 0   Evelina Dun, FNP

## 2017-01-08 ENCOUNTER — Other Ambulatory Visit: Payer: Self-pay | Admitting: Family

## 2017-01-08 DIAGNOSIS — I10 Essential (primary) hypertension: Secondary | ICD-10-CM

## 2017-01-08 DIAGNOSIS — E785 Hyperlipidemia, unspecified: Secondary | ICD-10-CM

## 2017-02-07 ENCOUNTER — Other Ambulatory Visit: Payer: Self-pay | Admitting: Family

## 2017-02-07 DIAGNOSIS — E119 Type 2 diabetes mellitus without complications: Secondary | ICD-10-CM

## 2017-03-16 ENCOUNTER — Telehealth: Payer: Self-pay | Admitting: Family

## 2017-03-16 NOTE — Telephone Encounter (Signed)
Scheduled

## 2017-03-21 ENCOUNTER — Other Ambulatory Visit: Payer: Self-pay | Admitting: Family

## 2017-03-21 DIAGNOSIS — E119 Type 2 diabetes mellitus without complications: Secondary | ICD-10-CM

## 2017-04-15 ENCOUNTER — Other Ambulatory Visit: Payer: Self-pay | Admitting: Family

## 2017-04-15 DIAGNOSIS — E785 Hyperlipidemia, unspecified: Secondary | ICD-10-CM

## 2017-04-15 DIAGNOSIS — I10 Essential (primary) hypertension: Secondary | ICD-10-CM

## 2017-04-23 ENCOUNTER — Encounter: Payer: Self-pay | Admitting: Family

## 2017-04-23 ENCOUNTER — Ambulatory Visit (INDEPENDENT_AMBULATORY_CARE_PROVIDER_SITE_OTHER): Payer: Medicare HMO | Admitting: Family

## 2017-04-23 VITALS — BP 194/91 | HR 60 | Temp 97.6°F | Ht 67.0 in | Wt 157.8 lb

## 2017-04-23 DIAGNOSIS — E785 Hyperlipidemia, unspecified: Secondary | ICD-10-CM | POA: Diagnosis not present

## 2017-04-23 DIAGNOSIS — E119 Type 2 diabetes mellitus without complications: Secondary | ICD-10-CM

## 2017-04-23 DIAGNOSIS — N529 Male erectile dysfunction, unspecified: Secondary | ICD-10-CM | POA: Diagnosis not present

## 2017-04-23 DIAGNOSIS — I10 Essential (primary) hypertension: Secondary | ICD-10-CM

## 2017-04-23 LAB — BAYER DCA HB A1C WAIVED: HB A1C: 8.3 % — AB (ref <7.0)

## 2017-04-23 MED ORDER — AMLODIPINE BESYLATE 5 MG PO TABS: 5.0000 mg | ORAL_TABLET | Freq: Every day | ORAL | 1 refills | Status: DC

## 2017-04-23 NOTE — Progress Notes (Signed)
Subjective:    Patient ID: Robert Osborne, male    DOB: [birth year], 71 y.o.   MRN: [id number]  Pt presents to the office today for chronic follow up. Diabetes  He presents for his follow-up diabetic visit. He has type 2 diabetes mellitus. His disease course has been stable. There are no hypoglycemic associated symptoms. There are no diabetic associated symptoms. Pertinent negatives for diabetes include no blurred vision, no foot paresthesias and no visual change. Symptoms are stable. Pertinent negatives for diabetic complications include no heart disease or nephropathy. He is following a diabetic diet. (Pt does not check BS at home ) Eye exam is current.  Hyperlipidemia  This is a chronic problem. The current episode started more than 1 year ago. The problem is controlled. Recent lipid tests were reviewed and are normal. Exacerbating diseases include obesity. Pertinent negatives include no shortness of breath. Current antihyperlipidemic treatment includes statins. The current treatment provides significant improvement of lipids.  Hypertension  This is a chronic problem. The current episode started more than 1 year ago. The problem has been waxing and waning since onset. The problem is uncontrolled. Pertinent negatives include no blurred vision, peripheral edema or shortness of breath. Risk factors for coronary artery disease include dyslipidemia, obesity, male gender and sedentary lifestyle.  Erectile Dysfunction  This is a chronic problem. The current episode started more than 1 year ago. The problem has been waxing and waning since onset. Past treatments include sildenafil. The treatment provided moderate relief.      Review of Systems  Eyes: Negative for blurred vision.  Respiratory: Negative for shortness of breath.   All other systems reviewed and are negative.      Objective:   Physical Exam  Constitutional: He is oriented to person, place, and time. He appears well-developed and  well-nourished. No distress.  HENT:  Head: Normocephalic.  Right Ear: External ear normal.  Left Ear: External ear normal.  Nose: Nose normal.  Mouth/Throat: Oropharynx is clear and moist.  Eyes: Pupils are equal, round, and reactive to light. Right eye exhibits no discharge. Left eye exhibits no discharge.  Neck: Normal range of motion. Neck supple. No thyromegaly present.  Cardiovascular: Normal rate, regular rhythm, normal heart sounds and intact distal pulses.   No murmur heard. Pulmonary/Chest: Effort normal and breath sounds normal. No respiratory distress. He has no wheezes.  Abdominal: Soft. Bowel sounds are normal. He exhibits no distension. There is no tenderness.  Musculoskeletal: Normal range of motion. He exhibits no edema or tenderness.  Neurological: He is alert and oriented to person, place, and time.  Skin: Skin is warm and dry. No rash noted. No erythema.  Psychiatric: He has a normal mood and affect. His behavior is normal. Judgment and thought content normal.  Vitals reviewed.     BP (!) 165/86   Pulse 62   Temp 97.6 F (36.4 C) (Oral)   Ht '5\' 7"'$  (1.702 m)   Wt 157 lb 12.8 oz (71.6 kg)   BMI 24.71 kg/m      Assessment & Plan:  1. Essential hypertension -Norcasc 5 mg started today -Dash diet information given -Exercise encouraged - Stress Management  -Continue current meds -RTO in 2 weeks - CMP14+EGFR - amLODipine (NORVASC) 5 MG tablet; Take 1 tablet (5 mg total) by mouth daily.  Dispense: 90 tablet; Refill: 1  2. Diabetes mellitus without complication ([place]) - QIH47+QQVZ - Bayer DCA Hb A1c Waived  3. Erectile dysfunction, unspecified erectile dysfunction type -  CMP14+EGFR  4. Hyperlipidemia, unspecified hyperlipidemia type - CMP14+EGFR - Lipid panel   Continue all meds Labs pending Health Maintenance reviewed Diet and exercise encouraged RTO 2 weeks to recheck HTN  Evelina Dun, FNP

## 2017-04-23 NOTE — Patient Instructions (Addendum)

## 2017-04-24 LAB — CMP14+EGFR
A/G RATIO: 1.8 (ref 1.2–2.2)
ALBUMIN: 4.3 g/dL (ref 3.5–4.8)
ALT: 15 IU/L (ref 0–44)
AST: 22 IU/L (ref 0–40)
Alkaline Phosphatase: 74 IU/L (ref 39–117)
BUN/Creatinine Ratio: 14 (ref 10–24)
BUN: 15 mg/dL (ref 8–27)
Bilirubin Total: 0.9 mg/dL (ref 0.0–1.2)
CALCIUM: 9.3 mg/dL (ref 8.6–10.2)
CO2: 26 mmol/L (ref 18–29)
CREATININE: 1.1 mg/dL (ref 0.76–1.27)
Chloride: 101 mmol/L (ref 96–106)
GFR calc Af Amer: 77 mL/min/{1.73_m2} (ref >59)
GFR, EST NON AFRICAN AMERICAN: 66 mL/min/{1.73_m2} (ref >59)
GLOBULIN, TOTAL: 2.4 g/dL (ref 1.5–4.5)
Glucose: 127 mg/dL — ABNORMAL HIGH (ref 65–99)
POTASSIUM: 3.4 mmol/L — AB (ref 3.5–5.2)
SODIUM: 144 mmol/L (ref 134–144)
Total Protein: 6.7 g/dL (ref 6.0–8.5)

## 2017-04-24 LAB — LIPID PANEL
CHOL/HDL RATIO: 2.8 ratio (ref 0.0–5.0)
Cholesterol, Total: 123 mg/dL (ref 100–199)
HDL: 44 mg/dL (ref >39)
LDL CALC: 61 mg/dL (ref 0–99)
TRIGLYCERIDES: 92 mg/dL (ref 0–149)
VLDL Cholesterol Cal: 18 mg/dL (ref 5–40)

## 2017-05-05 ENCOUNTER — Ambulatory Visit: Payer: Medicare HMO | Admitting: Pharmacist

## 2017-05-06 ENCOUNTER — Ambulatory Visit: Payer: Self-pay | Admitting: Pharmacist

## 2017-05-07 ENCOUNTER — Encounter: Payer: Self-pay | Admitting: Family

## 2017-05-07 ENCOUNTER — Ambulatory Visit (INDEPENDENT_AMBULATORY_CARE_PROVIDER_SITE_OTHER): Payer: Medicare HMO | Admitting: Family

## 2017-05-07 VITALS — BP 116/74 | HR 92 | Temp 98.1°F | Ht 67.0 in | Wt 150.2 lb

## 2017-05-07 DIAGNOSIS — I10 Essential (primary) hypertension: Secondary | ICD-10-CM

## 2017-05-07 NOTE — Progress Notes (Signed)
   Subjective:    Patient ID: Robert Osborne, male    DOB: 06/26/1946, 71 y.o.   MRN: [id number]  PT presents to the office today to recheck HTN. PT's BP is at goal. Hypertension  This is a chronic problem. The current episode started more than 1 year ago. The problem has been resolved since onset. The problem is controlled. Pertinent negatives include no headaches, peripheral edema or shortness of breath. The current treatment provides moderate improvement.      Review of Systems  Respiratory: Negative for shortness of breath.   Neurological: Negative for headaches.  All other systems reviewed and are negative.      Objective:   Physical Exam  Constitutional: He is oriented to person, place, and time. He appears well-developed and well-nourished. No distress.  HENT:  Head: Normocephalic.  Cardiovascular: Normal rate, regular rhythm, normal heart sounds and intact distal pulses.   No murmur heard. Pulmonary/Chest: Effort normal. No respiratory distress. He has decreased breath sounds. He has no wheezes.  Abdominal: Soft. Bowel sounds are normal. He exhibits no distension. There is no tenderness.  Musculoskeletal: Normal range of motion. He exhibits no edema or tenderness.  Neurological: He is alert and oriented to person, place, and time.  Skin: Skin is warm and dry. No rash noted. No erythema.  Psychiatric: He has a normal mood and affect. His behavior is normal. Judgment and thought content normal.  Vitals reviewed.     BP 116/74   Pulse 92   Temp 98.1 F (36.7 C) (Oral)   Ht _0  (1.702 m)   Wt 150 lb 3.2 oz (68.1 kg)   BMI 23.52 kg/m      Assessment & Plan:  1. Essential hypertension -Dash diet information given -Exercise encouraged - Stress Management  -Continue current meds -RTO in 3 months  - BMP8+EGFR   Evelina Dun, FNP

## 2017-05-07 NOTE — Patient Instructions (Signed)

## 2017-05-08 LAB — BMP8+EGFR
BUN/Creatinine Ratio: 15 (ref 10–24)
BUN: 23 mg/dL (ref 8–27)
CALCIUM: 9.9 mg/dL (ref 8.6–10.2)
CO2: 26 mmol/L (ref 18–29)
CREATININE: 1.51 mg/dL — AB (ref 0.76–1.27)
Chloride: 94 mmol/L — ABNORMAL LOW (ref 96–106)
GFR calc Af Amer: 52 mL/min/{1.73_m2} — ABNORMAL LOW (ref >59)
GFR, EST NON AFRICAN AMERICAN: 45 mL/min/{1.73_m2} — AB (ref >59)
Glucose: 260 mg/dL — ABNORMAL HIGH (ref 65–99)
Potassium: 3.8 mmol/L (ref 3.5–5.2)
SODIUM: 140 mmol/L (ref 134–144)

## 2017-05-12 ENCOUNTER — Other Ambulatory Visit: Payer: Self-pay | Admitting: Family

## 2017-05-12 DIAGNOSIS — R7989 Other specified abnormal findings of blood chemistry: Secondary | ICD-10-CM

## 2017-05-25 ENCOUNTER — Ambulatory Visit: Payer: Self-pay | Admitting: Pharmacist

## 2017-05-26 ENCOUNTER — Encounter: Payer: Self-pay | Admitting: Family

## 2017-06-08 ENCOUNTER — Encounter: Payer: Self-pay | Admitting: Pharmacist

## 2017-06-08 ENCOUNTER — Ambulatory Visit (INDEPENDENT_AMBULATORY_CARE_PROVIDER_SITE_OTHER): Payer: Medicare HMO | Admitting: Pharmacist

## 2017-06-08 VITALS — BP 136/70 | HR 76 | Ht 67.0 in | Wt 151.0 lb

## 2017-06-08 DIAGNOSIS — E1165 Type 2 diabetes mellitus with hyperglycemia: Secondary | ICD-10-CM

## 2017-06-08 DIAGNOSIS — R69 Illness, unspecified: Secondary | ICD-10-CM | POA: Diagnosis not present

## 2017-06-08 MED ORDER — BLOOD GLUCOSE MONITOR KIT: PACK | 0 refills | Status: DC

## 2017-06-08 MED ORDER — GLIPIZIDE ER 10 MG PO TB24: 10.0000 mg | ORAL_TABLET | Freq: Every day | ORAL | 1 refills | Status: DC

## 2017-06-08 NOTE — Progress Notes (Signed)
Patient ID: Robert Osborne, male   DOB: Mar 09, 1946, 71 y.o.   MRN: [id number]   Subjective:    Robert Osborne is a 71 y.o. male who presents for an initial evaluation of Type 2 diabetes mellitus.  Current symptoms/problems include hyperglycemia, polydipsia and fatigue and have been unchanged. Symptoms have been present for 3 months.  Current diabetic medications include oral agent (monotherapy): metformin (generic) 1000mg  bid.  Patient's med profile have that he should be taking Invokana and glipizide but he states he has not taken in several months.  Not sure why he has not been taking except that pharmacy has not filled them.    Current monitoring regimen: none Home blood sugar records: n/s Any episodes of hypoglycemia? no  Known diabetic complications: erectile dysfunction Cardiovascular risk factors: advanced age (older than 52 for men, 46 for women), diabetes mellitus, dyslipidemia, hypertension and male gender Eye exam current (within one year): no Weight trend: stable Prior visit with CDE: no Current diet: in general, an "unhealthy" diet Current exercise: none and but patient continues to work in [employer] Medication Compliance?  No   Is He on ACE inhibitor or angiotensin II receptor blocker?  Yes    losartan (Cozaar)  The following portions of the patient's history were reviewed and updated as appropriate: allergies, current medications, past family history, past medical history, past social history, past surgical history and problem list.    Objective:    BP 136/70   Pulse 76   Ht 5\' 7"  (1.702 m)   Wt 151 lb (68.5 kg)   BMI 23.65 kg/m    Last A1c = 8.3% (04/23/2017)  BMP Latest Ref Rng & Units 05/07/2017 04/23/2017 10/24/2016  Glucose 65 - 99 mg/dL 260(H) 127(H) 184(H)  BUN 8 - 27 mg/dL 23 15 14   Creatinine 0.76 - 1.27 mg/dL 1.51(H) 1.10 1.21  BUN/Creat Ratio 10 - 24 15 14 12   Sodium 134 - 144 mmol/L 140 144 139  Potassium 3.5 - 5.2 mmol/L 3.8 3.4(L) 3.5   Chloride 96 - 106 mmol/L 94(L) 101 94(L)  CO2 18 - 29 mmol/L 26 26 28   Calcium 8.6 - 10.2 mg/dL 9.9 9.3 9.9       Assessment:    Diabetes Mellitus type II, under inadequate control.    Plan:    1.  Rx changes: restart glipizide XL 10mg  qd   Continue metformin 1000mg  bid  Patient is to call office if BG is not less than 150 fasting or 200 post prandially after 2 weeks 2.  Education: Reviewed 'ABCs' of diabetes management (respective goals in parentheses):  A1C (<7), blood pressure (<130/80), and cholesterol (LDL <100). 3. Reminded to get eye exam  4. CHO counting diet discussed.  Reviewed CHO amount in various foods and how to read nutrition labels.  Discussed recommended serving sizes.  5.  Recommend check BG 1-2 times a day. Patient given Accu Check Guide glucometer and taught how to use.  He is also given Rx for generic glucometer incase Accu Check Guide is not covered.  6.  Recommended increase physical activity - goal is 150 minutes per week 7. Follow up: 4 weeks with PCP

## 2017-06-08 NOTE — Patient Instructions (Addendum)
Diabetes and Standards of Medical Care   Diabetes is complicated. You may find that your diabetes team includes a dietitian, nurse, diabetes educator, eye doctor, and more. To help everyone know what is going on and to help you get the care you deserve, the following schedule of care was developed to help keep you on track. Below are the tests, exams, vaccines, medicines, education, and plans you will need.  Blood Glucose Goals Prior to meals = 80 - 130 Within 2 hours of the start of a meal = less than 180  HbA1c test (goal is less than 7.0% - your last value was 8.3%) This test shows how well you have controlled your glucose over the past 2 to 3 months. It is used to see if your diabetes management plan needs to be adjusted.   It is performed at least 2 times a year if you are meeting treatment goals.  It is performed 4 times a year if therapy has changed or if you are not meeting treatment goals.  Blood pressure test  This test is performed at every routine medical visit. The goal is less than 140/90 mmHg for most people, but 130/80 mmHg in some cases. Ask your health care provider about your goal.  Dental exam  Follow up with the dentist regularly.  Eye exam  If you are diagnosed with type 1 diabetes as a child, get an exam upon reaching the age of 44 years or older and have had diabetes for 3 to 5 years. Yearly eye exams are recommended after that initial eye exam.  If you are diagnosed with type 1 diabetes as an adult, get an exam within 5 years of diagnosis and then yearly.  If you are diagnosed with type 2 diabetes, get an exam as soon as possible after the diagnosis and then yearly.  Foot care exam  Visual foot exams are performed at every routine medical visit. The exams check for cuts, injuries, or other problems with the feet.  A comprehensive foot exam should be done yearly. This includes visual inspection as well as assessing foot pulses and testing for loss of  sensation.  Check your feet nightly for cuts, injuries, or other problems with your feet. Tell your health care provider if anything is not healing.  Kidney function test (urine microalbumin)  This test is performed once a year.  Type 1 diabetes: The first test is performed 5 years after diagnosis.  Type 2 diabetes: The first test is performed at the time of diagnosis.  A serum creatinine and estimated glomerular filtration rate (eGFR) test is done once a year to assess the level of chronic kidney disease (CKD), if present.  Lipid profile (cholesterol, HDL, LDL, triglycerides)  Performed every 5 years for most people.  The goal for LDL is less than 100 mg/dL. If you are at high risk, the goal is less than 70 mg/dL.  The goal for HDL is 40 mg/dL to 50 mg/dL for men and 50 mg/dL to 60 mg/dL for women. An HDL cholesterol of 60 mg/dL or higher gives some protection against heart disease.  The goal for triglycerides is less than 150 mg/dL.  Influenza vaccine, pneumococcal vaccine, and hepatitis B vaccine  The influenza vaccine is recommended yearly.  The pneumococcal vaccine is generally given once in a lifetime. However, there are some instances when another vaccination is recommended. Check with your health care provider.  The hepatitis B vaccine is also recommended for adults with diabetes.  Diabetes self-management education  Education is recommended at diagnosis and ongoing as needed.  Treatment plan  Your treatment plan is reviewed at every medical visit.  Document Released: 09/28/2009 Document Revised: 08/03/2013 Document Reviewed: 05/03/2013 [employer] Patient Information 2014 [employer]   

## 2017-06-09 DIAGNOSIS — R69 Illness, unspecified: Secondary | ICD-10-CM | POA: Diagnosis not present

## 2017-06-16 DIAGNOSIS — R69 Illness, unspecified: Secondary | ICD-10-CM | POA: Diagnosis not present

## 2017-06-22 ENCOUNTER — Other Ambulatory Visit: Payer: Self-pay | Admitting: Family

## 2017-06-22 DIAGNOSIS — E119 Type 2 diabetes mellitus without complications: Secondary | ICD-10-CM

## 2017-08-03 ENCOUNTER — Other Ambulatory Visit: Payer: Self-pay | Admitting: Family

## 2017-08-03 DIAGNOSIS — I10 Essential (primary) hypertension: Secondary | ICD-10-CM

## 2017-08-03 DIAGNOSIS — E785 Hyperlipidemia, unspecified: Secondary | ICD-10-CM

## 2017-08-05 ENCOUNTER — Other Ambulatory Visit: Payer: Self-pay | Admitting: Family

## 2017-08-05 DIAGNOSIS — I10 Essential (primary) hypertension: Secondary | ICD-10-CM

## 2017-08-07 ENCOUNTER — Ambulatory Visit (INDEPENDENT_AMBULATORY_CARE_PROVIDER_SITE_OTHER): Payer: Medicare HMO | Admitting: Family

## 2017-08-07 ENCOUNTER — Encounter: Payer: Self-pay | Admitting: Family

## 2017-08-07 VITALS — BP 139/87 | HR 70 | Temp 97.6°F | Ht 67.0 in | Wt 151.8 lb

## 2017-08-07 DIAGNOSIS — E559 Vitamin D deficiency, unspecified: Secondary | ICD-10-CM

## 2017-08-07 DIAGNOSIS — Z1211 Encounter for screening for malignant neoplasm of colon: Secondary | ICD-10-CM | POA: Diagnosis not present

## 2017-08-07 DIAGNOSIS — E785 Hyperlipidemia, unspecified: Secondary | ICD-10-CM | POA: Diagnosis not present

## 2017-08-07 DIAGNOSIS — E119 Type 2 diabetes mellitus without complications: Secondary | ICD-10-CM | POA: Diagnosis not present

## 2017-08-07 DIAGNOSIS — I10 Essential (primary) hypertension: Secondary | ICD-10-CM

## 2017-08-07 LAB — BAYER DCA HB A1C WAIVED: HB A1C (BAYER DCA - WAIVED): 7.7 % — ABNORMAL HIGH (ref <7.0)

## 2017-08-07 NOTE — Progress Notes (Signed)
Subjective:    Patient ID: Robert Osborne, male    DOB: 28-Sep-1946, 71 y.o.   MRN: [id number]  Pt presents to the office today for chronic follow up. Hypertension  This is a chronic problem. The current episode started more than 1 year ago. The problem has been resolved since onset. The problem is controlled. Pertinent negatives include no blurred vision, chest pain, headaches, malaise/fatigue, peripheral edema or shortness of breath. Risk factors for coronary artery disease include diabetes mellitus, dyslipidemia, obesity and sedentary lifestyle. The current treatment provides moderate improvement. There is no history of kidney disease, CAD/MI, CVA or heart failure.  Diabetes  He presents for his follow-up diabetic visit. He has type 2 diabetes mellitus. His disease course has been worsening. Pertinent negatives for hypoglycemia include no headaches. There are no diabetic associated symptoms. Pertinent negatives for diabetes include no blurred vision, no chest pain, no foot paresthesias and no visual change. There are no hypoglycemic complications. Symptoms are worsening. There are no diabetic complications. Pertinent negatives for diabetic complications include no CVA, heart disease, nephropathy or peripheral neuropathy. Risk factors for coronary artery disease include dyslipidemia, diabetes mellitus, male sex and post-menopausal. He is following a generally healthy diet. His breakfast blood glucose range is generally 140-180 mg/dl.  Hyperlipidemia  This is a chronic problem. The current episode started more than 1 year ago. The problem is controlled. Recent lipid tests were reviewed and are normal. Pertinent negatives include no chest pain or shortness of breath. Current antihyperlipidemic treatment includes statins. The current treatment provides moderate improvement of lipids. Risk factors for coronary artery disease include diabetes mellitus, dyslipidemia, obesity, male sex, post-menopausal and a  sedentary lifestyle.      Review of Systems  Constitutional: Negative for malaise/fatigue.  Eyes: Negative for blurred vision.  Respiratory: Negative for shortness of breath.   Cardiovascular: Negative for chest pain.  Neurological: Negative for headaches.  All other systems reviewed and are negative.      Objective:   Physical Exam  Constitutional: He is oriented to person, place, and time. He appears well-developed and well-nourished. No distress.  HENT:  Head: Normocephalic.  Right Ear: External ear normal.  Left Ear: External ear normal.  Nose: Nose normal.  Mouth/Throat: Oropharynx is clear and moist.  Eyes: Pupils are equal, round, and reactive to light. Right eye exhibits no discharge. Left eye exhibits no discharge.  Neck: Normal range of motion. Neck supple. No thyromegaly present.  Cardiovascular: Normal rate, regular rhythm, normal heart sounds and intact distal pulses.   No murmur heard. Pulmonary/Chest: Effort normal and breath sounds normal. No respiratory distress. He has no wheezes.  Abdominal: Soft. Bowel sounds are normal. He exhibits no distension. There is no tenderness.  Musculoskeletal: Normal range of motion. He exhibits no edema or tenderness.  Neurological: He is alert and oriented to person, place, and time.  Skin: Skin is warm and dry. No rash noted. No erythema.  Psychiatric: He has a normal mood and affect. His behavior is normal. Judgment and thought content normal.  Vitals reviewed.     BP 140/88   Pulse 71   Temp 97.6 F (36.4 C) (Oral)   Ht '5\' 7"'$  (1.702 m)   Wt 151 lb 12.8 oz (68.9 kg)   BMI 23.78 kg/m      Assessment & Plan:  1. Essential hypertension - CMP14+EGFR  2. Diabetes mellitus without complication ([place]) - [facility] [place] Hb A1c Waived - CMP14+EGFR - Ambulatory referral to Ophthalmology  3.  Vitamin D deficiency - CMP14+EGFR  4. Hyperlipidemia, unspecified hyperlipidemia type - CMP14+EGFR - Lipid panel  5. Colon  cancer screening  - Fecal occult blood, imunochemical; Future   Continue all meds Labs pending Health Maintenance reviewed Diet and exercise encouraged RTO 6 months   Evelina Dun, FNP

## 2017-08-07 NOTE — Patient Instructions (Signed)
Diabetes Mellitus and Food It is important for you to manage your blood sugar (glucose) level. Your blood glucose level can be greatly affected by what you eat. Eating healthier foods in the appropriate amounts throughout the day at about the same time each day will help you control your blood glucose level. It can also help slow or prevent worsening of your diabetes mellitus. Healthy eating may even help you improve the level of your blood pressure and reach or maintain a healthy weight. General recommendations for healthful eating and cooking habits include:  Eating meals and snacks regularly. Avoid going long periods of time without eating to lose weight.  Eating a diet that consists mainly of plant-based foods, such as fruits, vegetables, nuts, legumes, and whole grains.  Using low-heat cooking methods, such as baking, instead of high-heat cooking methods, such as deep frying.  Work with your dietitian to make sure you understand how to use the Nutrition Facts information on food labels. How can food affect me? Carbohydrates Carbohydrates affect your blood glucose level more than any other type of food. Your dietitian will help you determine how many carbohydrates to eat at each meal and teach you how to count carbohydrates. Counting carbohydrates is important to keep your blood glucose at a healthy level, especially if you are using insulin or taking certain medicines for diabetes mellitus. Alcohol Alcohol can cause sudden decreases in blood glucose (hypoglycemia), especially if you use insulin or take certain medicines for diabetes mellitus. Hypoglycemia can be a life-threatening condition. Symptoms of hypoglycemia (sleepiness, dizziness, and disorientation) are similar to symptoms of having too much alcohol. If your health care provider has given you approval to drink alcohol, do so in moderation and use the following guidelines:  Women should not have more than one drink per day, and men  should not have more than two drinks per day. One drink is equal to: ? 12 oz of beer. ? 5 oz of wine. ? 1 oz of hard liquor.  Do not drink on an empty stomach.  Keep yourself hydrated. Have water, diet soda, or unsweetened iced tea.  Regular soda, juice, and other mixers might contain a lot of carbohydrates and should be counted.  What foods are not recommended? As you make food choices, it is important to remember that all foods are not the same. Some foods have fewer nutrients per serving than other foods, even though they might have the same number of calories or carbohydrates. It is difficult to get your body what it needs when you eat foods with fewer nutrients. Examples of foods that you should avoid that are high in calories and carbohydrates but low in nutrients include:  Trans fats (most processed foods list trans fats on the Nutrition Facts label).  Regular soda.  Juice.  Candy.  Sweets, such as cake, pie, doughnuts, and cookies.  Fried foods.  What foods can I eat? Eat nutrient-rich foods, which will nourish your body and keep you healthy. The food you should eat also will depend on several factors, including:  The calories you need.  The medicines you take.  Your weight.  Your blood glucose level.  Your blood pressure level.  Your cholesterol level.  You should eat a variety of foods, including:  Protein. ? Lean cuts of meat. ? Proteins low in saturated fats, such as fish, egg whites, and beans. Avoid processed meats.  Fruits and vegetables. ? Fruits and vegetables that may help control blood glucose levels, such as apples,   mangoes, and yams.  Dairy products. ? Choose fat-free or low-fat dairy products, such as milk, yogurt, and cheese.  Grains, bread, pasta, and rice. ? Choose whole grain products, such as multigrain bread, whole oats, and [place]el rice. These foods may help control blood pressure.  Fats. ? Foods containing healthful fats, such as  nuts, avocado, olive oil, canola oil, and fish.  Does everyone with diabetes mellitus have the same meal plan? Because every person with diabetes mellitus is different, there is not one meal plan that works for everyone. It is very important that you meet with a dietitian who will help you create a meal plan that is just right for you. This information is not intended to replace advice given to you by your health care provider. Make sure you discuss any questions you have with your health care provider. Document Released: 08/28/2005 Document Revised: 05/08/2016 Document Reviewed: 10/28/2013 Elsevier Interactive Patient Education  2017 [employer] Inc.  

## 2017-08-08 LAB — LIPID PANEL
CHOL/HDL RATIO: 2.7 ratio (ref 0.0–5.0)
Cholesterol, Total: 123 mg/dL (ref 100–199)
HDL: 46 mg/dL (ref >39)
LDL CALC: 54 mg/dL (ref 0–99)
TRIGLYCERIDES: 113 mg/dL (ref 0–149)
VLDL Cholesterol Cal: 23 mg/dL (ref 5–40)

## 2017-08-08 LAB — CMP14+EGFR
A/G RATIO: 1.3 (ref 1.2–2.2)
ALBUMIN: 3.9 g/dL (ref 3.5–4.8)
ALT: 16 IU/L (ref 0–44)
AST: 29 IU/L (ref 0–40)
Alkaline Phosphatase: 70 IU/L (ref 39–117)
BILIRUBIN TOTAL: 0.7 mg/dL (ref 0.0–1.2)
BUN / CREAT RATIO: 12 (ref 10–24)
BUN: 15 mg/dL (ref 8–27)
CHLORIDE: 102 mmol/L (ref 96–106)
CO2: 23 mmol/L (ref 20–29)
Calcium: 9.4 mg/dL (ref 8.6–10.2)
Creatinine, Ser: 1.26 mg/dL (ref 0.76–1.27)
GFR calc non Af Amer: 56 mL/min/{1.73_m2} — ABNORMAL LOW (ref >59)
GFR, EST AFRICAN AMERICAN: 65 mL/min/{1.73_m2} (ref >59)
Globulin, Total: 2.9 g/dL (ref 1.5–4.5)
Glucose: 83 mg/dL (ref 65–99)
POTASSIUM: 3.4 mmol/L — AB (ref 3.5–5.2)
Sodium: 144 mmol/L (ref 134–144)
TOTAL PROTEIN: 6.8 g/dL (ref 6.0–8.5)

## 2017-08-18 ENCOUNTER — Other Ambulatory Visit: Payer: Medicare HMO

## 2017-08-18 DIAGNOSIS — Z1211 Encounter for screening for malignant neoplasm of colon: Secondary | ICD-10-CM

## 2017-08-21 LAB — FECAL OCCULT BLOOD, IMMUNOCHEMICAL: FECAL OCCULT BLD: NEGATIVE

## 2017-09-07 DIAGNOSIS — E119 Type 2 diabetes mellitus without complications: Secondary | ICD-10-CM | POA: Diagnosis not present

## 2017-09-07 DIAGNOSIS — H524 Presbyopia: Secondary | ICD-10-CM | POA: Diagnosis not present

## 2017-09-07 LAB — HM DIABETES EYE EXAM

## 2017-09-10 ENCOUNTER — Other Ambulatory Visit: Payer: Self-pay | Admitting: Family

## 2017-09-10 DIAGNOSIS — I10 Essential (primary) hypertension: Secondary | ICD-10-CM

## 2017-09-19 DIAGNOSIS — R69 Illness, unspecified: Secondary | ICD-10-CM | POA: Diagnosis not present

## 2017-10-21 ENCOUNTER — Other Ambulatory Visit: Payer: Self-pay | Admitting: Family

## 2017-10-21 DIAGNOSIS — E119 Type 2 diabetes mellitus without complications: Secondary | ICD-10-CM

## 2017-10-21 DIAGNOSIS — I10 Essential (primary) hypertension: Secondary | ICD-10-CM

## 2017-11-19 ENCOUNTER — Other Ambulatory Visit: Payer: Self-pay | Admitting: Family

## 2017-11-19 DIAGNOSIS — E785 Hyperlipidemia, unspecified: Secondary | ICD-10-CM

## 2017-11-20 ENCOUNTER — Encounter: Payer: Self-pay | Admitting: Family

## 2017-11-20 ENCOUNTER — Encounter (HOSPITAL_COMMUNITY): Payer: Self-pay

## 2017-11-20 ENCOUNTER — Ambulatory Visit: Payer: Medicare HMO | Admitting: Family

## 2017-11-20 ENCOUNTER — Ambulatory Visit (HOSPITAL_COMMUNITY)
Admission: RE | Admit: 2017-11-20 | Discharge: 2017-11-20 | Disposition: A | Discharge status: Home / Self Care | Payer: Medicare HMO | Source: Ambulatory Visit | Attending: Family | Admitting: Family

## 2017-11-20 VITALS — BP 166/96 | HR 108 | Temp 97.3°F | Ht 67.0 in | Wt 152.8 lb

## 2017-11-20 DIAGNOSIS — R410 Disorientation, unspecified: Secondary | ICD-10-CM

## 2017-11-20 DIAGNOSIS — R451 Restlessness and agitation: Secondary | ICD-10-CM

## 2017-11-20 DIAGNOSIS — R69 Illness, unspecified: Secondary | ICD-10-CM | POA: Diagnosis not present

## 2017-11-20 LAB — URINALYSIS, COMPLETE
Bilirubin, UA: NEGATIVE
Ketones, UA: NEGATIVE
LEUKOCYTES UA: NEGATIVE
Nitrite, UA: NEGATIVE
PH UA: 7 (ref 5.0–7.5)
RBC, UA: NEGATIVE
Specific Gravity, UA: 1.015 (ref 1.005–1.030)
Urobilinogen, Ur: 0.2 mg/dL (ref 0.2–1.0)

## 2017-11-20 LAB — MICROSCOPIC EXAMINATION
Bacteria, UA: NONE SEEN
EPITHELIAL CELLS (NON RENAL): NONE SEEN /HPF (ref 0–10)
RBC MICROSCOPIC, UA: NONE SEEN /HPF (ref 0–?)
Renal Epithel, UA: NONE SEEN /hpf
WBC, UA: NONE SEEN /hpf (ref 0–?)

## 2017-11-20 LAB — GLUCOSE HEMOCUE WAIVED: GLU HEMOCUE WAIVED: 339 mg/dL — AB (ref 65–99)

## 2017-11-20 LAB — POCT I-STAT CREATININE: CREATININE: 1.4 mg/dL — AB (ref 0.61–1.24)

## 2017-11-20 IMAGING — CT CT HEAD WO/W CM
3 of 4 series · 15 of 47 positions shown, 18 images · IV contrast (Isovue)
Comparison: None.

CLINICAL DATA: Agitation and confusion, worsening recently.

EXAM:
CT HEAD WITHOUT AND WITH CONTRAST
TECHNIQUE: Contiguous axial images were obtained from the base of the skull
through the vertex without and with intravenous contrast
CONTRAST:  74 cc Isovue 300

[Series 2: head wo · axial · 0.47mm/px · z∈[+124,+259]mm · 9 of 33 slices shown, 12 images]
[im 3/33  brain]
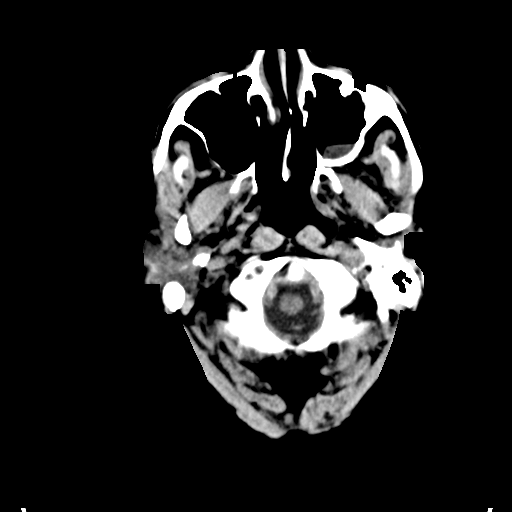
[im 3/33  bone]
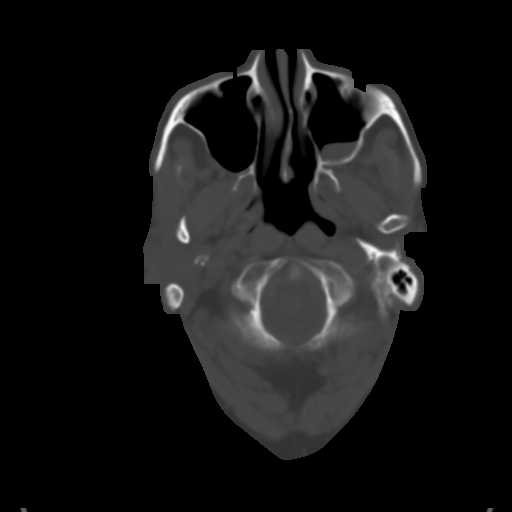
[im 7/33  brain]
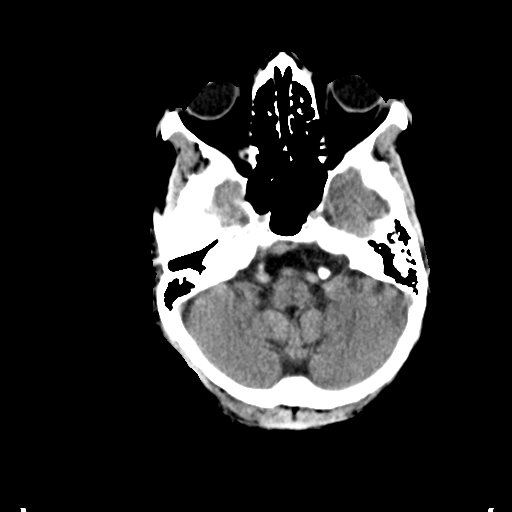
[im 10/33  brain]
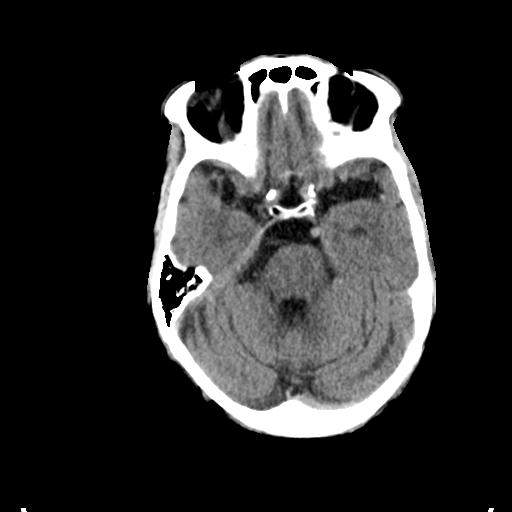
[im 14/33  brain]
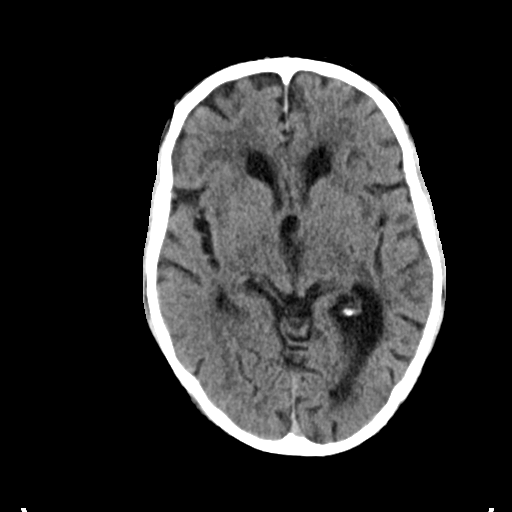
[im 17/33  brain]
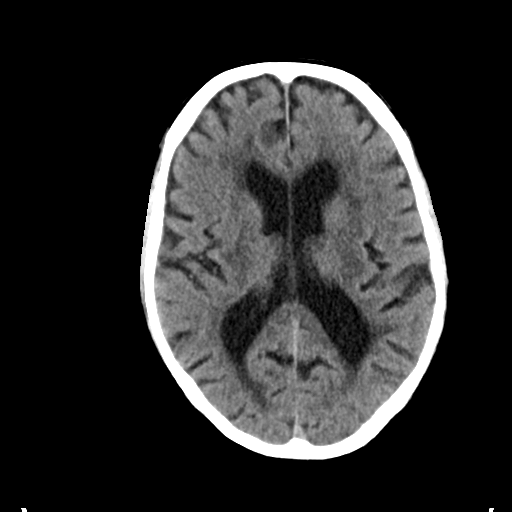
[im 17/33  bone]
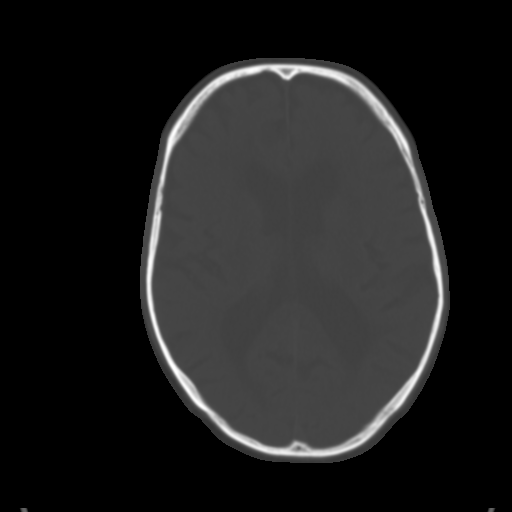
[im 19/33  brain]
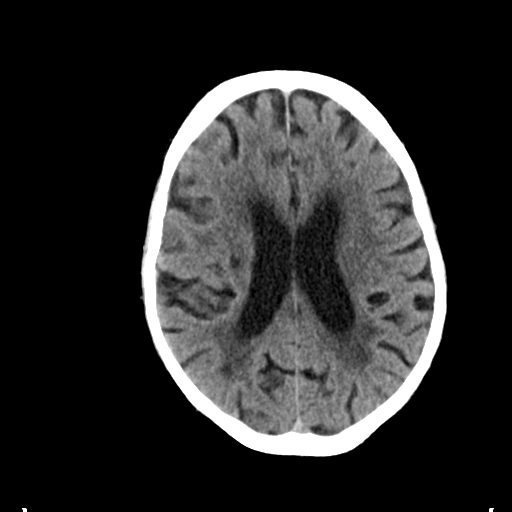
[im 23/33  brain]
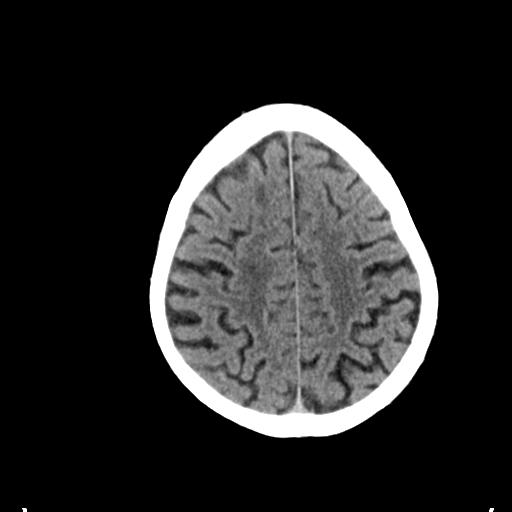
[im 26/33  brain]
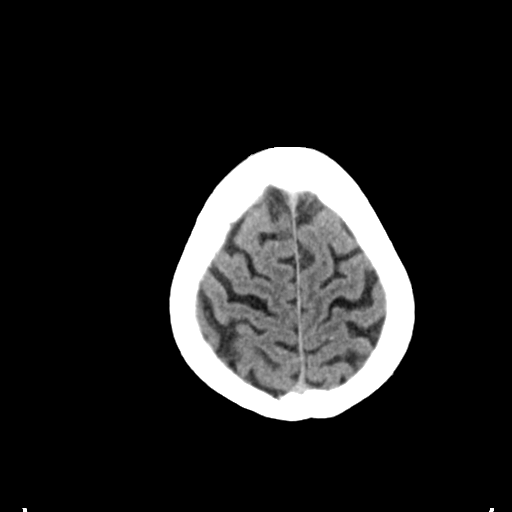
[im 30/33  brain]
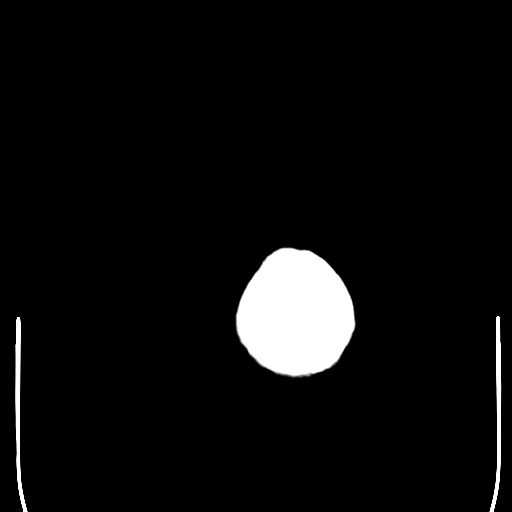
[im 30/33  bone]
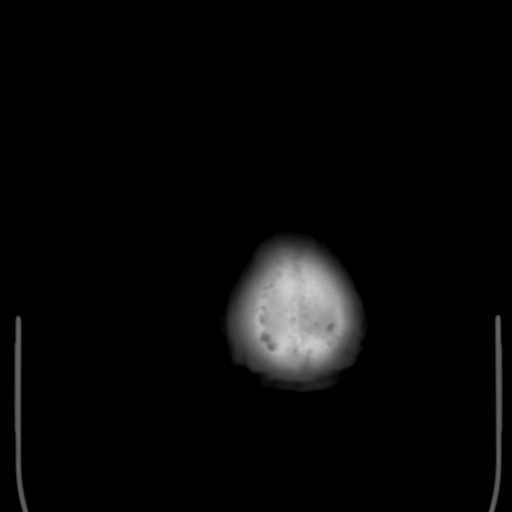

[Series 5: coronal soft tissue · coronal · 0.29mm/px · 3 of 67 slices shown]
[im 23/67  brain]
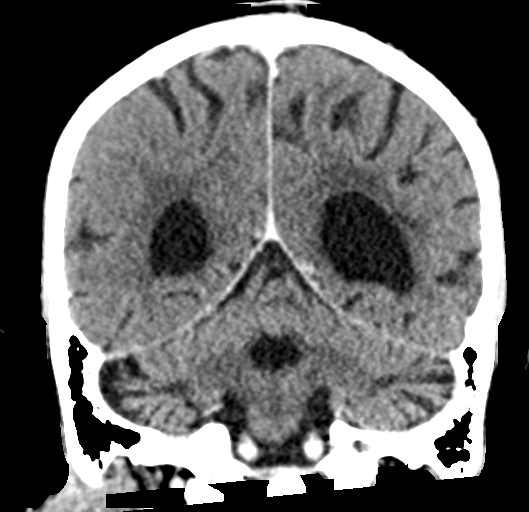
[im 30/67  brain]
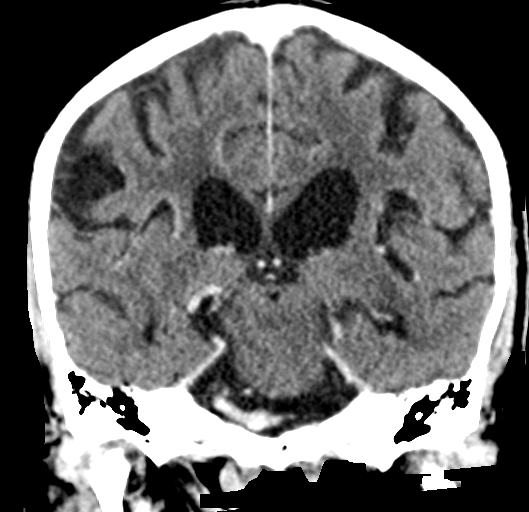
[im 37/67  brain]
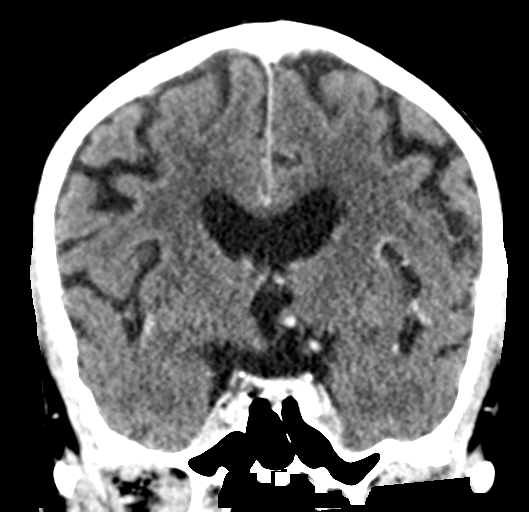

[Series 6: sagittal soft tissue · sagittal · 0.26mm/px · 3 of 51 slices shown]
[im 17/51  brain]
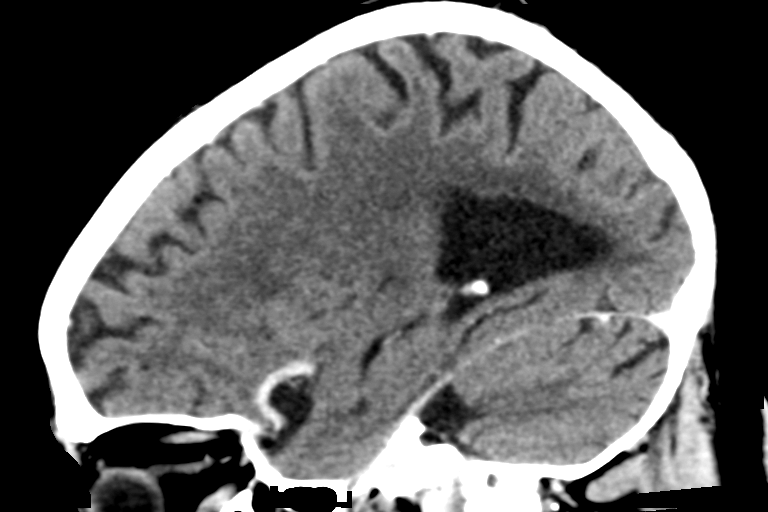
[im 26/51  brain]
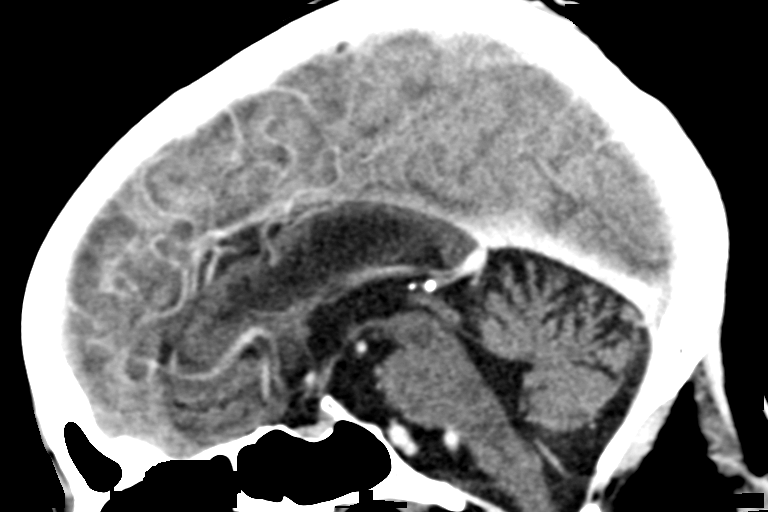
[im 34/51  brain]
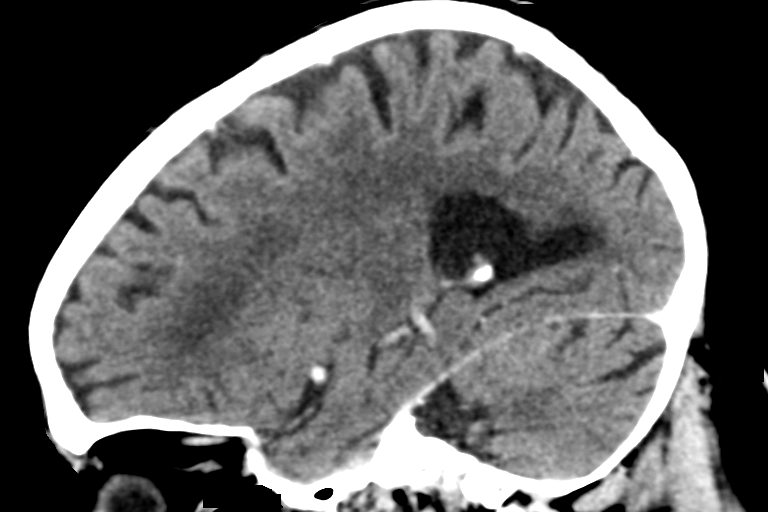

[15 of 47 positions shown; findings below may reference images not displayed]

FINDINGS: Brain: Generalized atrophy. Chronic small-vessel ischemic changes of
the cerebral hemispheric white matter. No large vessel territory
infarction. No mass lesion, hemorrhage, hydrocephalus or extra-axial
collection. No abnormal contrast enhancement.

Vascular: There is atherosclerotic calcification of the major
vessels at the base of the brain. No evidence of large or medium
vessel occlusion.

Skull: Negative

Sinuses/Orbits: Clear except for retention cyst left maxillary
sinus. Orbits negative.

Other: None
IMPRESSION: No acute finding. Atrophy and chronic small-vessel ischemic changes.

These results will be called to the ordering clinician or
representative by the Radiologist Assistant, and communication
documented in the PACS or zVision Dashboard.

## 2017-11-20 MED ORDER — IOPAMIDOL (ISOVUE-300) INJECTION 61%
75.0000 mL | Freq: Once | INTRAVENOUS | Status: AC | PRN
  Administered 2017-11-20: 75 mL via INTRAVENOUS

## 2017-11-20 NOTE — Progress Notes (Signed)
   Subjective:    Patient ID: Robert Osborne, male    DOB: Jul 27, 1946, 71 y.o.   MRN: [id number]  HPI PT presents to the office today stating that his wife is "crazy". Pt states he married her last year, but is driving him crazy. Pt does not want her back her in the office with him. States he does not have a lot of money, but does have money and his wife wants it.   According to his wife, pt has had a change in mental change over the last 3 months. Pt has become agitated  and irritated over the last week. States she had to call the police yesterday, because the patient was saying that he was going to hurt her and himself. The police offered to take patient, but she states she could not stand to put him in handcuffs.  Pt's min-mental is 25 today. PT states he would never hurt himself or anyone.    Review of Systems  Psychiatric/Behavioral: Positive for agitation and confusion. Negative for suicidal ideas. The patient is nervous/anxious and is hyperactive.   All other systems reviewed and are negative.      Objective:   Physical Exam  Constitutional: He is oriented to person, place, and time. He appears well-developed and well-nourished. No distress.  HENT:  Head: Normocephalic.  Right Ear: External ear normal.  Left Ear: External ear normal.  Mouth/Throat: Oropharynx is clear and moist.  Eyes: Pupils are equal, round, and reactive to light. Right eye exhibits no discharge. Left eye exhibits no discharge.  Neck: Normal range of motion. Neck supple. No thyromegaly present.  Cardiovascular: Normal rate, regular rhythm, normal heart sounds and intact distal pulses.  No murmur heard. Pulmonary/Chest: Effort normal and breath sounds normal. No respiratory distress. He has no wheezes.  Abdominal: Soft. Bowel sounds are normal. He exhibits no distension. There is no tenderness.  Musculoskeletal: Normal range of motion. He exhibits no edema or tenderness.  Neurological: He is alert and  oriented to person, place, and time. He has normal reflexes. No cranial nerve deficit.  Skin: Skin is warm and dry. No rash noted. No erythema.  Psychiatric: Judgment and thought content normal. His mood appears anxious. He is agitated and hyperactive. He expresses no homicidal and no suicidal ideation. He expresses no suicidal plans and no homicidal plans.  Vitals reviewed.     BP (!) 166/96   Pulse (!) 108   Temp (!) 97.3 F (36.3 C) (Oral)   Ht _0  (1.702 m)   Wt 152 lb 12.8 oz (69.3 kg)   BMI 23.93 kg/m      Assessment & Plan:  1. Confused - Urinalysis, Complete - CT HEAD W & WO CONTRAST; Future - Glucose Hemocue Waived - CMP14+EGFR - CBC with Differential/Platelet  2. Agitation - CT HEAD W & WO CONTRAST; Future - Glucose Hemocue Waived - CMP14+EGFR - CBC with Differential/Platelet   Will do CT scan to rule out tumor Labs pending- urine negative, CBC pending, Could be infection causing confusion? Spoke with wife and sister and told them if patient gets to the point where he is saying he is going to hurt himself or others then they do need to call the police and let them take him to the hospital  Evelina Dun, [place]

## 2017-11-21 LAB — CMP14+EGFR
ALBUMIN: 4.3 g/dL (ref 3.5–4.8)
ALK PHOS: 82 IU/L (ref 39–117)
ALT: 15 IU/L (ref 0–44)
AST: 26 IU/L (ref 0–40)
Albumin/Globulin Ratio: 1.5 (ref 1.2–2.2)
BUN / CREAT RATIO: 11 (ref 10–24)
BUN: 15 mg/dL (ref 8–27)
Bilirubin Total: 0.5 mg/dL (ref 0.0–1.2)
CO2: 26 mmol/L (ref 20–29)
CREATININE: 1.33 mg/dL — AB (ref 0.76–1.27)
Calcium: 10.3 mg/dL — ABNORMAL HIGH (ref 8.6–10.2)
Chloride: 97 mmol/L (ref 96–106)
GFR, EST AFRICAN AMERICAN: 61 mL/min/{1.73_m2} (ref >59)
GFR, EST NON AFRICAN AMERICAN: 53 mL/min/{1.73_m2} — AB (ref >59)
GLOBULIN, TOTAL: 2.8 g/dL (ref 1.5–4.5)
Glucose: 318 mg/dL — ABNORMAL HIGH (ref 65–99)
Potassium: 3.9 mmol/L (ref 3.5–5.2)
SODIUM: 143 mmol/L (ref 134–144)
Total Protein: 7.1 g/dL (ref 6.0–8.5)

## 2017-11-21 LAB — CBC WITH DIFFERENTIAL/PLATELET
BASOS: 0 %
Basophils Absolute: 0 10*3/uL (ref 0.0–0.2)
EOS (ABSOLUTE): 0 10*3/uL (ref 0.0–0.4)
EOS: 0 %
HEMATOCRIT: 42.2 % (ref 37.5–51.0)
Hemoglobin: 15.3 g/dL (ref 13.0–17.7)
Immature Grans (Abs): 0 10*3/uL (ref 0.0–0.1)
Immature Granulocytes: 0 %
LYMPHS ABS: 0.4 10*3/uL — AB (ref 0.7–3.1)
Lymphs: 8 %
MCH: 31.2 pg (ref 26.6–33.0)
MCHC: 36.3 g/dL — AB (ref 31.5–35.7)
MCV: 86 fL (ref 79–97)
MONOS ABS: 0.5 10*3/uL (ref 0.1–0.9)
Monocytes: 9 %
NEUTROS ABS: 4.6 10*3/uL (ref 1.4–7.0)
Neutrophils: 83 %
Platelets: 263 10*3/uL (ref 150–379)
RBC: 4.91 x10E6/uL (ref 4.14–5.80)
RDW: 13.3 % (ref 12.3–15.4)
WBC: 5.6 10*3/uL (ref 3.4–10.8)

## 2017-11-23 DIAGNOSIS — E1165 Type 2 diabetes mellitus with hyperglycemia: Secondary | ICD-10-CM | POA: Diagnosis not present

## 2017-11-23 DIAGNOSIS — R410 Disorientation, unspecified: Secondary | ICD-10-CM | POA: Diagnosis not present

## 2017-11-23 DIAGNOSIS — R739 Hyperglycemia, unspecified: Secondary | ICD-10-CM | POA: Diagnosis not present

## 2017-11-23 DIAGNOSIS — Z7982 Long term (current) use of aspirin: Secondary | ICD-10-CM | POA: Diagnosis not present

## 2017-11-23 DIAGNOSIS — Z7984 Long term (current) use of oral hypoglycemic drugs: Secondary | ICD-10-CM | POA: Diagnosis not present

## 2017-11-23 DIAGNOSIS — Z87440 Personal history of urinary (tract) infections: Secondary | ICD-10-CM | POA: Diagnosis not present

## 2017-11-23 DIAGNOSIS — Z79899 Other long term (current) drug therapy: Secondary | ICD-10-CM | POA: Diagnosis not present

## 2017-11-23 DIAGNOSIS — R4182 Altered mental status, unspecified: Secondary | ICD-10-CM | POA: Diagnosis not present

## 2017-11-23 DIAGNOSIS — Z87891 Personal history of nicotine dependence: Secondary | ICD-10-CM | POA: Diagnosis not present

## 2017-11-23 DIAGNOSIS — I10 Essential (primary) hypertension: Secondary | ICD-10-CM | POA: Diagnosis not present

## 2017-11-23 DIAGNOSIS — R413 Other amnesia: Secondary | ICD-10-CM | POA: Diagnosis not present

## 2017-11-25 DIAGNOSIS — E876 Hypokalemia: Secondary | ICD-10-CM | POA: Diagnosis not present

## 2017-11-25 DIAGNOSIS — R69 Illness, unspecified: Secondary | ICD-10-CM | POA: Diagnosis not present

## 2017-11-25 DIAGNOSIS — F0391 Unspecified dementia with behavioral disturbance: Secondary | ICD-10-CM | POA: Diagnosis not present

## 2017-11-25 DIAGNOSIS — R4182 Altered mental status, unspecified: Secondary | ICD-10-CM | POA: Diagnosis not present

## 2017-11-25 DIAGNOSIS — R443 Hallucinations, unspecified: Secondary | ICD-10-CM | POA: Diagnosis not present

## 2017-11-25 DIAGNOSIS — R410 Disorientation, unspecified: Secondary | ICD-10-CM | POA: Diagnosis not present

## 2017-11-25 DIAGNOSIS — N179 Acute kidney failure, unspecified: Secondary | ICD-10-CM | POA: Diagnosis not present

## 2017-11-26 ENCOUNTER — Ambulatory Visit: Payer: Medicare HMO | Admitting: Family

## 2017-11-26 DIAGNOSIS — E876 Hypokalemia: Secondary | ICD-10-CM | POA: Diagnosis not present

## 2017-11-26 DIAGNOSIS — R69 Illness, unspecified: Secondary | ICD-10-CM | POA: Diagnosis not present

## 2017-11-26 DIAGNOSIS — R4182 Altered mental status, unspecified: Secondary | ICD-10-CM | POA: Diagnosis not present

## 2017-11-27 DIAGNOSIS — G3109 Other frontotemporal dementia: Principal | ICD-10-CM

## 2017-11-27 DIAGNOSIS — I10 Essential (primary) hypertension: Secondary | ICD-10-CM | POA: Diagnosis not present

## 2017-11-27 DIAGNOSIS — N179 Acute kidney failure, unspecified: Secondary | ICD-10-CM | POA: Diagnosis not present

## 2017-11-27 DIAGNOSIS — E1165 Type 2 diabetes mellitus with hyperglycemia: Secondary | ICD-10-CM | POA: Insufficient documentation

## 2017-11-27 DIAGNOSIS — E876 Hypokalemia: Secondary | ICD-10-CM

## 2017-11-27 DIAGNOSIS — F0391 Unspecified dementia with behavioral disturbance: Secondary | ICD-10-CM | POA: Diagnosis not present

## 2017-11-27 DIAGNOSIS — E119 Type 2 diabetes mellitus without complications: Secondary | ICD-10-CM | POA: Diagnosis not present

## 2017-11-27 DIAGNOSIS — IMO0001 Reserved for inherently not codable concepts without codable children: Secondary | ICD-10-CM

## 2017-11-27 DIAGNOSIS — F0281 Dementia in other diseases classified elsewhere with behavioral disturbance: Principal | ICD-10-CM

## 2017-11-27 DIAGNOSIS — R69 Illness, unspecified: Secondary | ICD-10-CM | POA: Diagnosis not present

## 2017-11-27 DIAGNOSIS — R4182 Altered mental status, unspecified: Secondary | ICD-10-CM | POA: Diagnosis not present

## 2017-11-27 HISTORY — DX: Hypokalemia: E87.6

## 2017-11-27 HISTORY — DX: Hypomagnesemia: E83.42

## 2017-11-27 HISTORY — DX: Reserved for inherently not codable concepts without codable children: IMO0001

## 2017-11-28 DIAGNOSIS — R69 Illness, unspecified: Secondary | ICD-10-CM | POA: Diagnosis not present

## 2017-11-28 DIAGNOSIS — E785 Hyperlipidemia, unspecified: Secondary | ICD-10-CM | POA: Diagnosis not present

## 2017-11-28 DIAGNOSIS — E559 Vitamin D deficiency, unspecified: Secondary | ICD-10-CM | POA: Diagnosis not present

## 2017-11-28 DIAGNOSIS — I10 Essential (primary) hypertension: Secondary | ICD-10-CM | POA: Diagnosis not present

## 2017-11-28 DIAGNOSIS — Z888 Allergy status to other drugs, medicaments and biological substances status: Secondary | ICD-10-CM | POA: Diagnosis not present

## 2017-11-28 DIAGNOSIS — R4182 Altered mental status, unspecified: Secondary | ICD-10-CM | POA: Diagnosis not present

## 2017-11-28 DIAGNOSIS — G3109 Other frontotemporal dementia: Secondary | ICD-10-CM | POA: Diagnosis not present

## 2017-11-28 DIAGNOSIS — E11649 Type 2 diabetes mellitus with hypoglycemia without coma: Secondary | ICD-10-CM | POA: Diagnosis not present

## 2017-11-28 DIAGNOSIS — Z87891 Personal history of nicotine dependence: Secondary | ICD-10-CM | POA: Diagnosis not present

## 2017-11-28 DIAGNOSIS — Z79899 Other long term (current) drug therapy: Secondary | ICD-10-CM | POA: Diagnosis not present

## 2017-11-28 DIAGNOSIS — F22 Delusional disorders: Secondary | ICD-10-CM | POA: Diagnosis not present

## 2017-11-28 DIAGNOSIS — N179 Acute kidney failure, unspecified: Secondary | ICD-10-CM | POA: Diagnosis not present

## 2017-11-28 DIAGNOSIS — E119 Type 2 diabetes mellitus without complications: Secondary | ICD-10-CM | POA: Diagnosis not present

## 2017-11-28 DIAGNOSIS — F0391 Unspecified dementia with behavioral disturbance: Secondary | ICD-10-CM | POA: Diagnosis not present

## 2017-11-28 DIAGNOSIS — Z7984 Long term (current) use of oral hypoglycemic drugs: Secondary | ICD-10-CM | POA: Diagnosis not present

## 2017-11-28 DIAGNOSIS — F0281 Dementia in other diseases classified elsewhere with behavioral disturbance: Secondary | ICD-10-CM | POA: Diagnosis not present

## 2017-11-28 DIAGNOSIS — E876 Hypokalemia: Secondary | ICD-10-CM | POA: Diagnosis not present

## 2017-11-29 DIAGNOSIS — G3109 Other frontotemporal dementia: Secondary | ICD-10-CM | POA: Diagnosis not present

## 2017-11-29 DIAGNOSIS — F0281 Dementia in other diseases classified elsewhere with behavioral disturbance: Secondary | ICD-10-CM | POA: Diagnosis not present

## 2017-11-30 DIAGNOSIS — E11649 Type 2 diabetes mellitus with hypoglycemia without coma: Secondary | ICD-10-CM | POA: Diagnosis not present

## 2017-11-30 DIAGNOSIS — R69 Illness, unspecified: Secondary | ICD-10-CM | POA: Diagnosis not present

## 2017-11-30 DIAGNOSIS — I10 Essential (primary) hypertension: Secondary | ICD-10-CM | POA: Diagnosis not present

## 2017-11-30 DIAGNOSIS — F0281 Dementia in other diseases classified elsewhere with behavioral disturbance: Secondary | ICD-10-CM | POA: Diagnosis not present

## 2017-11-30 DIAGNOSIS — G3109 Other frontotemporal dementia: Secondary | ICD-10-CM | POA: Diagnosis not present

## 2017-12-01 DIAGNOSIS — E11649 Type 2 diabetes mellitus with hypoglycemia without coma: Secondary | ICD-10-CM | POA: Diagnosis not present

## 2017-12-01 DIAGNOSIS — E559 Vitamin D deficiency, unspecified: Secondary | ICD-10-CM | POA: Diagnosis not present

## 2017-12-01 DIAGNOSIS — I10 Essential (primary) hypertension: Secondary | ICD-10-CM | POA: Diagnosis not present

## 2017-12-01 DIAGNOSIS — E785 Hyperlipidemia, unspecified: Secondary | ICD-10-CM | POA: Diagnosis not present

## 2017-12-01 DIAGNOSIS — E876 Hypokalemia: Secondary | ICD-10-CM | POA: Diagnosis not present

## 2017-12-01 DIAGNOSIS — R69 Illness, unspecified: Secondary | ICD-10-CM | POA: Diagnosis not present

## 2017-12-01 DIAGNOSIS — F0281 Dementia in other diseases classified elsewhere with behavioral disturbance: Secondary | ICD-10-CM | POA: Diagnosis not present

## 2017-12-01 DIAGNOSIS — G3109 Other frontotemporal dementia: Secondary | ICD-10-CM | POA: Diagnosis not present

## 2017-12-01 DIAGNOSIS — F22 Delusional disorders: Secondary | ICD-10-CM | POA: Diagnosis not present

## 2017-12-02 DIAGNOSIS — E876 Hypokalemia: Secondary | ICD-10-CM | POA: Diagnosis not present

## 2017-12-02 DIAGNOSIS — I10 Essential (primary) hypertension: Secondary | ICD-10-CM | POA: Diagnosis not present

## 2017-12-02 DIAGNOSIS — E11649 Type 2 diabetes mellitus with hypoglycemia without coma: Secondary | ICD-10-CM | POA: Diagnosis not present

## 2017-12-02 DIAGNOSIS — E785 Hyperlipidemia, unspecified: Secondary | ICD-10-CM | POA: Diagnosis not present

## 2017-12-02 DIAGNOSIS — F0281 Dementia in other diseases classified elsewhere with behavioral disturbance: Secondary | ICD-10-CM | POA: Diagnosis not present

## 2017-12-02 DIAGNOSIS — F22 Delusional disorders: Secondary | ICD-10-CM | POA: Diagnosis not present

## 2017-12-02 DIAGNOSIS — R69 Illness, unspecified: Secondary | ICD-10-CM | POA: Diagnosis not present

## 2017-12-02 DIAGNOSIS — G3109 Other frontotemporal dementia: Secondary | ICD-10-CM | POA: Diagnosis not present

## 2017-12-02 DIAGNOSIS — E559 Vitamin D deficiency, unspecified: Secondary | ICD-10-CM | POA: Diagnosis not present

## 2017-12-03 DIAGNOSIS — G3109 Other frontotemporal dementia: Secondary | ICD-10-CM | POA: Diagnosis not present

## 2017-12-03 DIAGNOSIS — E11649 Type 2 diabetes mellitus with hypoglycemia without coma: Secondary | ICD-10-CM | POA: Diagnosis not present

## 2017-12-03 DIAGNOSIS — F0281 Dementia in other diseases classified elsewhere with behavioral disturbance: Secondary | ICD-10-CM | POA: Diagnosis not present

## 2017-12-03 DIAGNOSIS — E785 Hyperlipidemia, unspecified: Secondary | ICD-10-CM | POA: Diagnosis not present

## 2017-12-03 DIAGNOSIS — F22 Delusional disorders: Secondary | ICD-10-CM | POA: Diagnosis not present

## 2017-12-03 DIAGNOSIS — E559 Vitamin D deficiency, unspecified: Secondary | ICD-10-CM | POA: Diagnosis not present

## 2017-12-03 DIAGNOSIS — I10 Essential (primary) hypertension: Secondary | ICD-10-CM | POA: Diagnosis not present

## 2017-12-03 DIAGNOSIS — E876 Hypokalemia: Secondary | ICD-10-CM | POA: Diagnosis not present

## 2017-12-04 DIAGNOSIS — G3109 Other frontotemporal dementia: Secondary | ICD-10-CM | POA: Diagnosis not present

## 2017-12-04 DIAGNOSIS — R69 Illness, unspecified: Secondary | ICD-10-CM | POA: Diagnosis not present

## 2017-12-04 DIAGNOSIS — F22 Delusional disorders: Secondary | ICD-10-CM | POA: Diagnosis not present

## 2017-12-04 DIAGNOSIS — E559 Vitamin D deficiency, unspecified: Secondary | ICD-10-CM | POA: Diagnosis not present

## 2017-12-04 DIAGNOSIS — E785 Hyperlipidemia, unspecified: Secondary | ICD-10-CM | POA: Diagnosis not present

## 2017-12-04 DIAGNOSIS — E876 Hypokalemia: Secondary | ICD-10-CM | POA: Diagnosis not present

## 2017-12-04 DIAGNOSIS — E11649 Type 2 diabetes mellitus with hypoglycemia without coma: Secondary | ICD-10-CM | POA: Diagnosis not present

## 2017-12-04 DIAGNOSIS — I10 Essential (primary) hypertension: Secondary | ICD-10-CM | POA: Diagnosis not present

## 2017-12-04 DIAGNOSIS — F0281 Dementia in other diseases classified elsewhere with behavioral disturbance: Secondary | ICD-10-CM | POA: Diagnosis not present

## 2017-12-10 DIAGNOSIS — E876 Hypokalemia: Secondary | ICD-10-CM | POA: Diagnosis not present

## 2017-12-10 DIAGNOSIS — Z7984 Long term (current) use of oral hypoglycemic drugs: Secondary | ICD-10-CM | POA: Diagnosis not present

## 2017-12-10 DIAGNOSIS — R69 Illness, unspecified: Secondary | ICD-10-CM | POA: Diagnosis not present

## 2017-12-10 DIAGNOSIS — R41841 Cognitive communication deficit: Secondary | ICD-10-CM | POA: Diagnosis not present

## 2017-12-10 DIAGNOSIS — I10 Essential (primary) hypertension: Secondary | ICD-10-CM | POA: Diagnosis not present

## 2017-12-10 DIAGNOSIS — E119 Type 2 diabetes mellitus without complications: Secondary | ICD-10-CM | POA: Diagnosis not present

## 2017-12-10 DIAGNOSIS — G3109 Other frontotemporal dementia: Secondary | ICD-10-CM | POA: Diagnosis not present

## 2017-12-10 DIAGNOSIS — E785 Hyperlipidemia, unspecified: Secondary | ICD-10-CM | POA: Diagnosis not present

## 2017-12-11 NOTE — Progress Notes (Signed)
Subjective: PV:[id number] follow up PCP: Sharion Balloon, FNP [id number] Robert Osborne is a 71 y.o. male presenting to clinic today for:  1. Hospital follow up Patient evaluated in the emergency department on 11/23/2017, 12/13, 12/15, and admitted on 12/17 for altered mental status.  He was found to be alert and oriented x4 during that exam but could not answer other questions appropriately at that time.  He was noted to have low potassium and low magnesium during that hospitalization.  Additionally, B12, TSH and RPR were tested all within normal limits with RPR negative.  He was discharged on Depakote ER 1000 mg daily, Zyprexa 5 mg twice daily, trazodone 100 mg 3 times daily and Exelon patch 4.6 mg daily.  On the day of discharge, he remained confused but denied SI, HI or other violent ideation.  He was discharged with home health nurse, aide and social work.  It was recommended that he should not drive or manage finances etc.  He was instructed to follow with mental health provider.  Recommendation was to consider long-term placement in the locked dementia facility versus seeking a guardian.  He is accompanied by his wife today's visit he reports that he has been doing fairly well since discharge from the hospital.  She notes that he is tolerating medication without difficulty.  Patient also reflects this.  She notes that his appetite seems to be improving.  He denies confusion, intolerance to medications.  He has an appointment tomorrow with the neurologist at [facility].  His wife wonders whether or not his medications will continue to be titrated.  Additionally, he wants to know if he can take his cholesterol medication in the morning instead of nighttime.   She notes that she is able to be at home for him 24/7 but is asking for additional help so that she can take care of other needs outside of the home.  She brings paperwork from [place] today to be completed for this.  Currently, patient  is receiving outpatient home health speech therapy.  He reports some chest congestion that has been gradually improving over the last 3-4 days.  He denies hemoptysis, shortness of breath, fevers, chills.  ROS: Per HPI  Allergies  Allergen Reactions  . Enalapril Cough   Past Medical History:  Diagnosis Date  . Cancer [facility])    papillary urethral carcinoma,low grade, non-invasive  . Diabetes mellitus without complication ([place])   . Hyperlipidemia   . Hypertension     Current Outpatient Medications:  .  amLODipine (NORVASC) 5 MG tablet, TAKE 1 TABLET BY MOUTH ONCE DAILY, Disp: 90 tablet, Rfl: 0 .  aspirin 81 MG tablet, Take 81 mg by mouth daily.  , Disp: , Rfl:  .  atorvastatin (LIPITOR) 40 MG tablet, TAKE 1 TABLET BY MOUTH ONCE DAILY, Disp: 90 tablet, Rfl: 0 .  blood glucose meter kit and supplies KIT, Dispense based on patient and insurance preference. Use up to twice a daily as directed. (FOR ICD-10 - type 2 DM uncontrolled E11.9), Disp: 1 each, Rfl: 0 .  Cholecalciferol (VITAMIN D-3) 5000 UNITS TABS, Take 1,000 Int'l Units by mouth daily. , Disp: , Rfl:  .  cloNIDine (CATAPRES) 0.1 MG tablet, TAKE ONE TABLET BY MOUTH ONCE DAILY, Disp: 90 tablet, Rfl: 0 .  glipiZIDE (GLUCOTROL XL) 10 MG 24 hr tablet, Take 1 tablet (10 mg total) by mouth daily with breakfast., Disp: 90 tablet, Rfl: 1 .  hydrochlorothiazide (HYDRODIURIL) 25 MG tablet, TAKE 1 TABLET  BY MOUTH ONCE DAILY, Disp: 90 tablet, Rfl: 0 .  losartan (COZAAR) 100 MG tablet, TAKE 1 TABLET BY MOUTH ONCE DAILY, Disp: 90 tablet, Rfl: 0 .  metFORMIN (GLUCOPHAGE) 1000 MG tablet, TAKE 1 TABLET BY MOUTH TWICE DAILY, Disp: 180 tablet, Rfl: 0 .  ONE TOUCH ULTRA TEST test strip, , Disp: , Rfl:  .  ONETOUCH DELICA LANCETS 16S MISC, , Disp: , Rfl:  .  sildenafil (VIAGRA) 100 MG tablet, Take 0.5-1 tablets (50-100 mg total) by mouth daily as needed for erectile dysfunction., Disp: 5 tablet, Rfl: 11 Social History   Socioeconomic History  .  Marital status: Married    Spouse name: Not on file  . Number of children: 3  . Years of education: Not on file  . Highest education level: Not on file  Social Needs  . Financial resource strain: Not on file  . Food insecurity - worry: Not on file  . Food insecurity - inability: Not on file  . Transportation needs - medical: Not on file  . Transportation needs - non-medical: Not on file  Occupational History  . Not on file  Tobacco Use  . Smoking status: Former Smoker    Last attempt to quit: 05/17/1973    Years since quitting: 44.6  . Smokeless tobacco: Never Used  Substance and Sexual Activity  . Alcohol use: No  . Drug use: No  . Sexual activity: Not on file  Other Topics Concern  . Not on file  Social History Narrative   Still works as Development worker, international aid.      Family History  Problem Relation Age of Onset  . Heart attack Mother 47    Objective: Office vital signs reviewed. BP (!) 165/87   Pulse 74   Temp (!) 97.4 F (36.3 C) (Oral)   Ht '5\' 7"'$  (1.702 m)   Wt 155 lb (70.3 kg)   BMI 24.28 kg/m   Physical Examination:  General: Awake, alert, well nourished, well appearing, No acute distress HEENT: Normal    Neck: No masses palpated. No lymphadenopathy    Ears: Tympanic membranes intact, normal light reflex, no erythema, no bulging    Eyes: PERRLA, extraocular membranes intact, sclera white    Nose: nasal turbinates moist, no nasal discharge    Throat: moist mucus membranes, no erythema, no tonsillar exudate.  Airway is patent Cardio: regular rate and rhythm, S1S2 heard, no murmurs appreciated Pulm: clear to auscultation bilaterally, no wheezes, rhonchi or rales; normal work of breathing on room air Neuro: Follows all commands.  No focal deficits.  Alert and oriented x3 Psych: Mood stable, speech normal, affect appropriate, pleasant, responds to questions appropriately  Depression screen [facility] 2/9 11/20/2017 08/07/2017 05/07/2017  Decreased Interest 0 0 0  Down, Depressed,  Hopeless 0 0 0  PHQ - 2 Score 0 0 0    Assessment/ Plan: 71 y.o. male   1. Major frontotemporal neurocognitive disorder, probable, with behavioral disturbance Continue current regimen as outlined by behavioral health.  He seems to be responding well to this.  He has a follow-up with his neurologist tomorrow.  I recommended that his wife have any records sent from that appointment to patient's PCP [facility].  We will plan to work on Chief Operating Officer through [place], as patient will most certainly need continued assistance w/ medications and monitoring given significant decline.  Follow-up as needed.  2. Hospital discharge follow-up Records reviewed.  We will plan to recheck magnesium and CMP in the setting of  recent hypokalemia and hypomagnesemia.  3. Hypomagnesemia - Magnesium  4. Hypokalemia - CMP14+EGFR  Additionally, patient's blood pressure noted to be elevated with systolic blood pressure of 165.  I recommended that patient follow-up in the next 2 weeks with PCP for recheck of this. Strict return precautions and reasons for emergent evaluation in the emergency department review with patient.  They voiced understanding and will follow-up as needed.   Janora Norlander, DO [place] [phone]

## 2017-12-16 ENCOUNTER — Encounter: Payer: Self-pay | Admitting: Family Medicine

## 2017-12-16 ENCOUNTER — Ambulatory Visit (INDEPENDENT_AMBULATORY_CARE_PROVIDER_SITE_OTHER): Payer: Medicare HMO | Admitting: Family Medicine

## 2017-12-16 VITALS — BP 165/87 | HR 74 | Temp 97.4°F | Ht 67.0 in | Wt 155.0 lb

## 2017-12-16 DIAGNOSIS — Z09 Encounter for follow-up examination after completed treatment for conditions other than malignant neoplasm: Secondary | ICD-10-CM | POA: Diagnosis not present

## 2017-12-16 DIAGNOSIS — G3109 Other frontotemporal dementia: Secondary | ICD-10-CM

## 2017-12-16 DIAGNOSIS — R69 Illness, unspecified: Secondary | ICD-10-CM | POA: Diagnosis not present

## 2017-12-16 DIAGNOSIS — E876 Hypokalemia: Secondary | ICD-10-CM | POA: Diagnosis not present

## 2017-12-16 DIAGNOSIS — F0281 Dementia in other diseases classified elsewhere with behavioral disturbance: Secondary | ICD-10-CM | POA: Diagnosis not present

## 2017-12-16 DIAGNOSIS — IMO0001 Reserved for inherently not codable concepts without codable children: Secondary | ICD-10-CM

## 2017-12-16 NOTE — Patient Instructions (Signed)
Make sure that his specialist keep in contact with [facility].  Continue the medications he was prescribed as directed.  If Robert Osborne has not come out in 2 weeks, please make sure that he has a follow-up appointment with [facility] for blood pressure.  Work on reducing salt as we discussed.  You had labs performed today.  You will be contacted with the results of the labs once they are available, usually in the next 3 days for routine lab work.  I value your feedback and appreciate you entrusting [place] with your care.  If you get a survey, I would appreciate your taking the time to let us know what your experience was like.   DASH Eating Plan DASH stands for "Dietary Approaches to Stop Hypertension." The DASH eating plan is a healthy eating plan that has been shown to reduce high blood pressure (hypertension). It may also reduce your risk for type 2 diabetes, heart disease, and stroke. The DASH eating plan may also help with weight loss. What are tips for following this plan? General guidelines  Avoid eating more than 2,300 mg (milligrams) of salt (sodium) a day. If you have hypertension, you may need to reduce your sodium intake to 1,500 mg a day.  Limit alcohol intake to no more than 1 drink a day for nonpregnant women and 2 drinks a day for men. One drink equals 12 oz of beer, 5 oz of wine, or 1 oz of hard liquor.  Work with your health care provider to maintain a healthy body weight or to lose weight. Ask what an ideal weight is for you.  Get at least 30 minutes of exercise that causes your heart to beat faster (aerobic exercise) most days of the week. Activities may include walking, swimming, or biking.  Work with your health care provider or diet and nutrition specialist (dietitian) to adjust your eating plan to your individual calorie needs. Reading food labels  Check food labels for the amount of sodium per serving. Choose foods with less than 5 percent of the Daily Value of sodium. Generally,  foods with less than 300 mg of sodium per serving fit into this eating plan.  To find whole grains, look for the word "whole" as the first word in the ingredient list. Shopping  Buy products labeled as "low-sodium" or "no salt added."  Buy fresh foods. Avoid canned foods and premade or frozen meals. Cooking  Avoid adding salt when cooking. Use salt-free seasonings or herbs instead of table salt or sea salt. Check with your health care provider or pharmacist before using salt substitutes.  Do not fry foods. Cook foods using healthy methods such as baking, boiling, grilling, and broiling instead.  Cook with heart-healthy oils, such as olive, canola, soybean, or sunflower oil. Meal planning   Eat a balanced diet that includes: ? 5 or more servings of fruits and vegetables each day. At each meal, try to fill half of your plate with fruits and vegetables. ? Up to 6-8 servings of whole grains each day. ? Less than 6 oz of lean meat, poultry, or fish each day. A 3-oz serving of meat is about the same size as a deck of cards. One egg equals 1 oz. ? 2 servings of low-fat dairy each day. ? A serving of nuts, seeds, or beans 5 times each week. ? Heart-healthy fats. Healthy fats called Omega-3 fatty acids are found in foods such as flaxseeds and coldwater fish, like sardines, salmon, and mackerel.  Limit how  much you eat of the following: ? Canned or prepackaged foods. ? Food that is high in trans fat, such as fried foods. ? Food that is high in saturated fat, such as fatty meat. ? Sweets, desserts, sugary drinks, and other foods with added sugar. ? Full-fat dairy products.  Do not salt foods before eating.  Try to eat at least 2 vegetarian meals each week.  Eat more home-cooked food and less restaurant, buffet, and fast food.  When eating at a restaurant, ask that your food be prepared with less salt or no salt, if possible. What foods are recommended? The items listed may not be a  complete list. Talk with your dietitian about what dietary choices are best for you. Grains Whole-grain or whole-wheat bread. Whole-grain or whole-wheat pasta. Meng rice. Modena Morrow. Bulgur. Whole-grain and low-sodium cereals. Pita bread. Low-fat, low-sodium crackers. Whole-wheat flour tortillas. Vegetables Fresh or frozen vegetables (raw, steamed, roasted, or grilled). Low-sodium or reduced-sodium tomato and vegetable juice. Low-sodium or reduced-sodium tomato sauce and tomato paste. Low-sodium or reduced-sodium canned vegetables. Fruits All fresh, dried, or frozen fruit. Canned fruit in natural juice (without added sugar). Meat and other protein foods Skinless chicken or [place]. Ground chicken or [place]. Pork with fat trimmed off. Fish and seafood. Egg whites. Dried beans, peas, or lentils. Unsalted nuts, nut butters, and seeds. Unsalted canned beans. Lean cuts of beef with fat trimmed off. Low-sodium, lean deli meat. Dairy Low-fat (1%) or fat-free (skim) milk. Fat-free, low-fat, or reduced-fat cheeses. Nonfat, low-sodium ricotta or cottage cheese. Low-fat or nonfat yogurt. Low-fat, low-sodium cheese. Fats and oils Soft margarine without trans fats. Vegetable oil. Low-fat, reduced-fat, or light mayonnaise and salad dressings (reduced-sodium). Canola, safflower, olive, soybean, and sunflower oils. Avocado. Seasoning and other foods Herbs. Spices. Seasoning mixes without salt. Unsalted popcorn and pretzels. Fat-free sweets. What foods are not recommended? The items listed may not be a complete list. Talk with your dietitian about what dietary choices are best for you. Grains Baked goods made with fat, such as croissants, muffins, or some breads. Dry pasta or rice meal packs. Vegetables Creamed or fried vegetables. Vegetables in a cheese sauce. Regular canned vegetables (not low-sodium or reduced-sodium). Regular canned tomato sauce and paste (not low-sodium or reduced-sodium). Regular  tomato and vegetable juice (not low-sodium or reduced-sodium). Angie Fava. Olives. Fruits Canned fruit in a light or heavy syrup. Fried fruit. Fruit in cream or butter sauce. Meat and other protein foods Fatty cuts of meat. Ribs. Fried meat. Berniece Salines. Sausage. Bologna and other processed lunch meats. Salami. Fatback. Hotdogs. Bratwurst. Salted nuts and seeds. Canned beans with added salt. Canned or smoked fish. Whole eggs or egg yolks. Chicken or [place] with skin. Dairy Whole or 2% milk, cream, and half-and-half. Whole or full-fat cream cheese. Whole-fat or sweetened yogurt. Full-fat cheese. Nondairy creamers. Whipped toppings. Processed cheese and cheese spreads. Fats and oils Butter. Stick margarine. Lard. Shortening. Ghee. Bacon fat. Tropical oils, such as coconut, palm kernel, or palm oil. Seasoning and other foods Salted popcorn and pretzels. Onion salt, garlic salt, seasoned salt, table salt, and sea salt. Worcestershire sauce. Tartar sauce. Barbecue sauce. Teriyaki sauce. Soy sauce, including reduced-sodium. Steak sauce. Canned and packaged gravies. Fish sauce. Oyster sauce. Cocktail sauce. Horseradish that you find on the shelf. Ketchup. Mustard. Meat flavorings and tenderizers. Bouillon cubes. Hot sauce and Tabasco sauce. Premade or packaged marinades. Premade or packaged taco seasonings. Relishes. Regular salad dressings. Where to find more information:  National Heart, Lung, and Blood Institute: https://wilson-eaton.com/  American Heart Association: www.heart.org Summary  The DASH eating plan is a healthy eating plan that has been shown to reduce high blood pressure (hypertension). It may also reduce your risk for type 2 diabetes, heart disease, and stroke.  With the DASH eating plan, you should limit salt (sodium) intake to 2,300 mg a day. If you have hypertension, you may need to reduce your sodium intake to 1,500 mg a day.  When on the DASH eating plan, aim to eat more fresh fruits and  vegetables, whole grains, lean proteins, low-fat dairy, and heart-healthy fats.  Work with your health care provider or diet and nutrition specialist (dietitian) to adjust your eating plan to your individual calorie needs. This information is not intended to replace advice given to you by your health care provider. Make sure you discuss any questions you have with your health care provider. Document Released: 11/20/2011 Document Revised: 11/24/2016 Document Reviewed: 11/24/2016 Elsevier Interactive Patient Education  [employer].

## 2017-12-17 DIAGNOSIS — R69 Illness, unspecified: Secondary | ICD-10-CM | POA: Diagnosis not present

## 2017-12-17 DIAGNOSIS — G3109 Other frontotemporal dementia: Secondary | ICD-10-CM | POA: Diagnosis not present

## 2017-12-17 LAB — CMP14+EGFR
ALT: 19 IU/L (ref 0–44)
AST: 23 IU/L (ref 0–40)
Albumin/Globulin Ratio: 1.5 (ref 1.2–2.2)
Albumin: 3.7 g/dL (ref 3.5–4.8)
Alkaline Phosphatase: 80 IU/L (ref 39–117)
BUN/Creatinine Ratio: 16 (ref 10–24)
BUN: 18 mg/dL (ref 8–27)
Bilirubin Total: 0.4 mg/dL (ref 0.0–1.2)
CALCIUM: 9 mg/dL (ref 8.6–10.2)
CO2: 23 mmol/L (ref 20–29)
CREATININE: 1.11 mg/dL (ref 0.76–1.27)
Chloride: 102 mmol/L (ref 96–106)
GFR calc Af Amer: 76 mL/min/{1.73_m2} (ref >59)
GFR, EST NON AFRICAN AMERICAN: 66 mL/min/{1.73_m2} (ref >59)
GLUCOSE: 233 mg/dL — AB (ref 65–99)
Globulin, Total: 2.5 g/dL (ref 1.5–4.5)
Potassium: 3.9 mmol/L (ref 3.5–5.2)
Sodium: 142 mmol/L (ref 134–144)
Total Protein: 6.2 g/dL (ref 6.0–8.5)

## 2017-12-17 LAB — MAGNESIUM: MAGNESIUM: 1.7 mg/dL (ref 1.6–2.3)

## 2017-12-18 DIAGNOSIS — I10 Essential (primary) hypertension: Secondary | ICD-10-CM | POA: Diagnosis not present

## 2017-12-18 DIAGNOSIS — R41841 Cognitive communication deficit: Secondary | ICD-10-CM | POA: Diagnosis not present

## 2017-12-18 DIAGNOSIS — E119 Type 2 diabetes mellitus without complications: Secondary | ICD-10-CM | POA: Diagnosis not present

## 2017-12-18 DIAGNOSIS — Z7984 Long term (current) use of oral hypoglycemic drugs: Secondary | ICD-10-CM | POA: Diagnosis not present

## 2017-12-18 DIAGNOSIS — E876 Hypokalemia: Secondary | ICD-10-CM | POA: Diagnosis not present

## 2017-12-18 DIAGNOSIS — R69 Illness, unspecified: Secondary | ICD-10-CM | POA: Diagnosis not present

## 2017-12-18 DIAGNOSIS — G3109 Other frontotemporal dementia: Secondary | ICD-10-CM | POA: Diagnosis not present

## 2017-12-18 DIAGNOSIS — E785 Hyperlipidemia, unspecified: Secondary | ICD-10-CM | POA: Diagnosis not present

## 2017-12-22 DIAGNOSIS — E119 Type 2 diabetes mellitus without complications: Secondary | ICD-10-CM | POA: Diagnosis not present

## 2017-12-22 DIAGNOSIS — E785 Hyperlipidemia, unspecified: Secondary | ICD-10-CM | POA: Diagnosis not present

## 2017-12-22 DIAGNOSIS — R41841 Cognitive communication deficit: Secondary | ICD-10-CM | POA: Diagnosis not present

## 2017-12-22 DIAGNOSIS — G3109 Other frontotemporal dementia: Secondary | ICD-10-CM | POA: Diagnosis not present

## 2017-12-22 DIAGNOSIS — E876 Hypokalemia: Secondary | ICD-10-CM | POA: Diagnosis not present

## 2017-12-22 DIAGNOSIS — R69 Illness, unspecified: Secondary | ICD-10-CM | POA: Diagnosis not present

## 2017-12-22 DIAGNOSIS — I10 Essential (primary) hypertension: Secondary | ICD-10-CM | POA: Diagnosis not present

## 2017-12-22 DIAGNOSIS — Z7984 Long term (current) use of oral hypoglycemic drugs: Secondary | ICD-10-CM | POA: Diagnosis not present

## 2017-12-25 DIAGNOSIS — Z7984 Long term (current) use of oral hypoglycemic drugs: Secondary | ICD-10-CM | POA: Diagnosis not present

## 2017-12-25 DIAGNOSIS — I10 Essential (primary) hypertension: Secondary | ICD-10-CM | POA: Diagnosis not present

## 2017-12-25 DIAGNOSIS — R41841 Cognitive communication deficit: Secondary | ICD-10-CM | POA: Diagnosis not present

## 2017-12-25 DIAGNOSIS — G3109 Other frontotemporal dementia: Secondary | ICD-10-CM | POA: Diagnosis not present

## 2017-12-25 DIAGNOSIS — E876 Hypokalemia: Secondary | ICD-10-CM | POA: Diagnosis not present

## 2017-12-25 DIAGNOSIS — E119 Type 2 diabetes mellitus without complications: Secondary | ICD-10-CM | POA: Diagnosis not present

## 2017-12-25 DIAGNOSIS — E785 Hyperlipidemia, unspecified: Secondary | ICD-10-CM | POA: Diagnosis not present

## 2017-12-25 DIAGNOSIS — R69 Illness, unspecified: Secondary | ICD-10-CM | POA: Diagnosis not present

## 2017-12-28 ENCOUNTER — Ambulatory Visit (INDEPENDENT_AMBULATORY_CARE_PROVIDER_SITE_OTHER): Payer: Medicare HMO

## 2017-12-28 DIAGNOSIS — G3109 Other frontotemporal dementia: Secondary | ICD-10-CM | POA: Diagnosis not present

## 2017-12-28 DIAGNOSIS — E119 Type 2 diabetes mellitus without complications: Secondary | ICD-10-CM | POA: Diagnosis not present

## 2017-12-28 DIAGNOSIS — Z7984 Long term (current) use of oral hypoglycemic drugs: Secondary | ICD-10-CM | POA: Diagnosis not present

## 2017-12-28 DIAGNOSIS — R41841 Cognitive communication deficit: Secondary | ICD-10-CM | POA: Diagnosis not present

## 2017-12-28 DIAGNOSIS — F0281 Dementia in other diseases classified elsewhere with behavioral disturbance: Secondary | ICD-10-CM | POA: Diagnosis not present

## 2017-12-28 DIAGNOSIS — E876 Hypokalemia: Secondary | ICD-10-CM | POA: Diagnosis not present

## 2017-12-28 DIAGNOSIS — I10 Essential (primary) hypertension: Secondary | ICD-10-CM | POA: Diagnosis not present

## 2017-12-28 DIAGNOSIS — E785 Hyperlipidemia, unspecified: Secondary | ICD-10-CM

## 2017-12-28 DIAGNOSIS — F062 Psychotic disorder with delusions due to known physiological condition: Secondary | ICD-10-CM | POA: Diagnosis not present

## 2017-12-28 DIAGNOSIS — R69 Illness, unspecified: Secondary | ICD-10-CM | POA: Diagnosis not present

## 2017-12-29 DIAGNOSIS — R69 Illness, unspecified: Secondary | ICD-10-CM | POA: Diagnosis not present

## 2017-12-29 DIAGNOSIS — R443 Hallucinations, unspecified: Secondary | ICD-10-CM | POA: Diagnosis not present

## 2017-12-29 DIAGNOSIS — R9089 Other abnormal findings on diagnostic imaging of central nervous system: Secondary | ICD-10-CM | POA: Diagnosis not present

## 2017-12-29 DIAGNOSIS — G319 Degenerative disease of nervous system, unspecified: Secondary | ICD-10-CM | POA: Diagnosis not present

## 2017-12-29 DIAGNOSIS — R930 Abnormal findings on diagnostic imaging of skull and head, not elsewhere classified: Secondary | ICD-10-CM | POA: Diagnosis not present

## 2017-12-29 DIAGNOSIS — F22 Delusional disorders: Secondary | ICD-10-CM | POA: Diagnosis not present

## 2017-12-30 ENCOUNTER — Encounter: Payer: Self-pay | Admitting: *Deleted

## 2017-12-31 ENCOUNTER — Ambulatory Visit (INDEPENDENT_AMBULATORY_CARE_PROVIDER_SITE_OTHER): Payer: Medicare HMO | Admitting: Family

## 2017-12-31 ENCOUNTER — Encounter: Payer: Self-pay | Admitting: Family

## 2017-12-31 VITALS — BP 182/99 | HR 74 | Temp 97.0°F | Ht 67.0 in | Wt 157.6 lb

## 2017-12-31 DIAGNOSIS — E785 Hyperlipidemia, unspecified: Secondary | ICD-10-CM

## 2017-12-31 DIAGNOSIS — F0281 Dementia in other diseases classified elsewhere with behavioral disturbance: Secondary | ICD-10-CM

## 2017-12-31 DIAGNOSIS — I10 Essential (primary) hypertension: Secondary | ICD-10-CM | POA: Diagnosis not present

## 2017-12-31 DIAGNOSIS — E1159 Type 2 diabetes mellitus with other circulatory complications: Secondary | ICD-10-CM

## 2017-12-31 DIAGNOSIS — I152 Hypertension secondary to endocrine disorders: Secondary | ICD-10-CM

## 2017-12-31 DIAGNOSIS — E119 Type 2 diabetes mellitus without complications: Secondary | ICD-10-CM | POA: Diagnosis not present

## 2017-12-31 DIAGNOSIS — IMO0001 Reserved for inherently not codable concepts without codable children: Secondary | ICD-10-CM

## 2017-12-31 DIAGNOSIS — G3109 Other frontotemporal dementia: Secondary | ICD-10-CM

## 2017-12-31 DIAGNOSIS — R69 Illness, unspecified: Secondary | ICD-10-CM | POA: Diagnosis not present

## 2017-12-31 DIAGNOSIS — E1169 Type 2 diabetes mellitus with other specified complication: Secondary | ICD-10-CM

## 2017-12-31 NOTE — Progress Notes (Signed)
   Subjective:    Patient ID: Robert Osborne, male    DOB: 1946/03/11, 72 y.o.   MRN: [id number]  HPI Pt presents to the office today to complete DMV paperwork. Pt was seen in the office on 11/20/18 with alter mental status and Major frontotemporal neurocognitive disorder, probable, with behavioral disturbance. Wife states he saw a Neurologists on 12/17/17 and was told he had a normal MRI and CT scan.   Since being started on Depakote and Zyprexa he has not had any more episodes of confusion or disorientation.   Pt has DM and HTN. Pt's HTN is elevated today, but is anxious today because he is unsure why he has to fill out DMV paperwork.    Review of Systems  All other systems reviewed and are negative.      Objective:   Physical Exam  Constitutional: He is oriented to person, place, and time. He appears well-developed and well-nourished. No distress.  HENT:  Head: Normocephalic.  Eyes: Pupils are equal, round, and reactive to light. Right eye exhibits no discharge. Left eye exhibits no discharge.  Neck: Normal range of motion. Neck supple. No thyromegaly present.  Cardiovascular: Normal rate, regular rhythm, normal heart sounds and intact distal pulses.  No murmur heard. Pulmonary/Chest: Effort normal and breath sounds normal. No respiratory distress. He has no wheezes.  Abdominal: Soft. Bowel sounds are normal. He exhibits no distension. There is no tenderness.  Musculoskeletal: Normal range of motion. He exhibits no edema or tenderness.  Neurological: He is alert and oriented to person, place, and time.  Skin: Skin is warm and dry. No rash noted. No erythema.  Psychiatric: He has a normal mood and affect. His behavior is normal. Judgment and thought content normal.  Vitals reviewed.     BP (!) 182/99   Pulse 74   Temp (!) 97 F (36.1 C) (Oral)   Ht 5\' 7"  (1.702 m)   Wt 157 lb 9.6 oz (71.5 kg)   BMI 24.68 kg/m      Assessment & Plan:  1. Major frontotemporal  neurocognitive disorder, probable, with behavioral disturbance  2. Hypertension associated with diabetes ([place])  3. Diabetes mellitus without complication ([place])  4. Hyperlipidemia associated with type 2 diabetes mellitus ([place])  DMV paperwork filled out  I believe patient had several issues going on, but have since resolved and has had negative scans Keep follow up appts with Neurologists and chronic follow up  Evelina Dun, FNP

## 2018-01-01 DIAGNOSIS — R69 Illness, unspecified: Secondary | ICD-10-CM | POA: Diagnosis not present

## 2018-01-01 DIAGNOSIS — E876 Hypokalemia: Secondary | ICD-10-CM | POA: Diagnosis not present

## 2018-01-01 DIAGNOSIS — G3109 Other frontotemporal dementia: Secondary | ICD-10-CM | POA: Diagnosis not present

## 2018-01-01 DIAGNOSIS — I10 Essential (primary) hypertension: Secondary | ICD-10-CM | POA: Diagnosis not present

## 2018-01-01 DIAGNOSIS — R41841 Cognitive communication deficit: Secondary | ICD-10-CM | POA: Diagnosis not present

## 2018-01-01 DIAGNOSIS — Z7984 Long term (current) use of oral hypoglycemic drugs: Secondary | ICD-10-CM | POA: Diagnosis not present

## 2018-01-01 DIAGNOSIS — E119 Type 2 diabetes mellitus without complications: Secondary | ICD-10-CM | POA: Diagnosis not present

## 2018-01-01 DIAGNOSIS — E785 Hyperlipidemia, unspecified: Secondary | ICD-10-CM | POA: Diagnosis not present

## 2018-01-05 DIAGNOSIS — E119 Type 2 diabetes mellitus without complications: Secondary | ICD-10-CM | POA: Diagnosis not present

## 2018-01-05 DIAGNOSIS — R69 Illness, unspecified: Secondary | ICD-10-CM | POA: Diagnosis not present

## 2018-01-05 DIAGNOSIS — E785 Hyperlipidemia, unspecified: Secondary | ICD-10-CM | POA: Diagnosis not present

## 2018-01-05 DIAGNOSIS — R41841 Cognitive communication deficit: Secondary | ICD-10-CM | POA: Diagnosis not present

## 2018-01-05 DIAGNOSIS — I10 Essential (primary) hypertension: Secondary | ICD-10-CM | POA: Diagnosis not present

## 2018-01-05 DIAGNOSIS — G3109 Other frontotemporal dementia: Secondary | ICD-10-CM | POA: Diagnosis not present

## 2018-01-05 DIAGNOSIS — Z7984 Long term (current) use of oral hypoglycemic drugs: Secondary | ICD-10-CM | POA: Diagnosis not present

## 2018-01-05 DIAGNOSIS — E876 Hypokalemia: Secondary | ICD-10-CM | POA: Diagnosis not present

## 2018-01-14 ENCOUNTER — Encounter: Payer: Self-pay | Admitting: Family

## 2018-01-14 ENCOUNTER — Ambulatory Visit (INDEPENDENT_AMBULATORY_CARE_PROVIDER_SITE_OTHER): Payer: Medicare HMO | Admitting: Family

## 2018-01-14 VITALS — BP 170/86 | HR 82 | Temp 97.3°F | Ht 67.0 in | Wt 159.2 lb

## 2018-01-14 DIAGNOSIS — R5383 Other fatigue: Secondary | ICD-10-CM | POA: Diagnosis not present

## 2018-01-14 DIAGNOSIS — E119 Type 2 diabetes mellitus without complications: Secondary | ICD-10-CM | POA: Diagnosis not present

## 2018-01-14 DIAGNOSIS — E559 Vitamin D deficiency, unspecified: Secondary | ICD-10-CM | POA: Diagnosis not present

## 2018-01-14 DIAGNOSIS — G3109 Other frontotemporal dementia: Secondary | ICD-10-CM | POA: Diagnosis not present

## 2018-01-14 DIAGNOSIS — I10 Essential (primary) hypertension: Secondary | ICD-10-CM | POA: Diagnosis not present

## 2018-01-14 DIAGNOSIS — E1169 Type 2 diabetes mellitus with other specified complication: Secondary | ICD-10-CM

## 2018-01-14 DIAGNOSIS — E1159 Type 2 diabetes mellitus with other circulatory complications: Secondary | ICD-10-CM

## 2018-01-14 DIAGNOSIS — I152 Hypertension secondary to endocrine disorders: Secondary | ICD-10-CM

## 2018-01-14 DIAGNOSIS — F0281 Dementia in other diseases classified elsewhere with behavioral disturbance: Secondary | ICD-10-CM

## 2018-01-14 DIAGNOSIS — IMO0001 Reserved for inherently not codable concepts without codable children: Secondary | ICD-10-CM

## 2018-01-14 DIAGNOSIS — Z1211 Encounter for screening for malignant neoplasm of colon: Secondary | ICD-10-CM | POA: Diagnosis not present

## 2018-01-14 DIAGNOSIS — R69 Illness, unspecified: Secondary | ICD-10-CM | POA: Diagnosis not present

## 2018-01-14 DIAGNOSIS — E785 Hyperlipidemia, unspecified: Secondary | ICD-10-CM

## 2018-01-14 LAB — BAYER DCA HB A1C WAIVED: HB A1C (BAYER DCA - WAIVED): 8.4 % — ABNORMAL HIGH (ref <7.0)

## 2018-01-14 MED ORDER — AMLODIPINE BESYLATE 10 MG PO TABS: 10.0000 mg | ORAL_TABLET | Freq: Every day | ORAL | 3 refills | Status: DC

## 2018-01-14 NOTE — Patient Instructions (Signed)

## 2018-01-14 NOTE — Progress Notes (Signed)
   Subjective:    Patient ID: Robert Osborne, male    DOB: [birth year], 72 y.o.   MRN: [id number]  Pt presents to the office today for follow up. PT is followed by [facility] every 3 months and Neurology for [facility] problems.   Diabetes  He presents for his follow-up diabetic visit. He has type 2 diabetes mellitus. His disease course has been stable. There are no hypoglycemic associated symptoms. Pertinent negatives for diabetes include no blurred vision, no foot paresthesias and no foot ulcerations. Symptoms are stable. Pertinent negatives for diabetic complications include no CVA or heart disease. Risk factors for coronary artery disease include diabetes mellitus, dyslipidemia, male sex and sedentary lifestyle.  Hypertension  This is a chronic problem. The current episode started more than 1 year ago. The problem has been waxing and waning since onset. The problem is uncontrolled. Pertinent negatives include no blurred vision, peripheral edema or shortness of breath. There is no history of CAD/MI or CVA.      Review of Systems  Eyes: Negative for blurred vision.  Respiratory: Negative for shortness of breath.   All other systems reviewed and are negative.      Objective:   Physical Exam  Constitutional: He is oriented to person, place, and time. He appears well-developed and well-nourished. No distress.  HENT:  Head: Normocephalic.  Right Ear: External ear normal.  Left Ear: External ear normal.  Nose: Nose normal.  Mouth/Throat: Oropharynx is clear and moist.  Eyes: Pupils are equal, round, and reactive to light. Right eye exhibits no discharge. Left eye exhibits no discharge.  Neck: Normal range of motion. Neck supple. No thyromegaly present.  Cardiovascular: Normal rate, regular rhythm, normal heart sounds and intact distal pulses.  No murmur heard. Pulmonary/Chest: Effort normal and breath sounds normal. No respiratory distress. He has no wheezes.  Abdominal: Soft.  Bowel sounds are normal. He exhibits no distension. There is no tenderness.  Musculoskeletal: Normal range of motion. He exhibits no edema or tenderness.  Neurological: He is alert and oriented to person, place, and time.  Skin: Skin is warm and dry. No rash noted. No erythema.  Psychiatric: He has a normal mood and affect. His behavior is normal. Judgment and thought content normal.  Vitals reviewed.     BP (!) 170/86   Pulse 82   Temp (!) 97.3 F (36.3 C) (Oral)   Ht '5\' 7"'$  (1.702 m)   Wt 159 lb 3.2 oz (72.2 kg)   BMI 24.93 kg/m      Assessment & Plan:  1. Major frontotemporal neurocognitive disorder, probable, with behavioral disturbance - CMP14+EGFR  2. Hypertension associated with diabetes ([place]) Will increase Norvasc 10 mg  -Dash diet information given -Exercise encouraged - Stress Management  -Continue current meds -RTO in 2 weeks  - CMP14+EGFR - amLODipine (NORVASC) 10 MG tablet; Take 1 tablet (10 mg total) by mouth daily.  Dispense: 90 tablet; Refill: 3  3. Diabetes mellitus without complication ([place]) - Bayer DCA Hb A1c Waived - CMP14+EGFR - Microalbumin / creatinine urine ratio  4. Hyperlipidemia associated with type 2 diabetes mellitus (HCC) - CMP14+EGFR  5. Vitamin D deficiency - CMP14+EGFR - VITAMIN D 25 Hydroxy (Vit-D Deficiency, Fractures)  6. Fatigue, unspecified type - CMP14+EGFR - TSH  7. Colon cancer screening - Fecal occult blood, imunochemical; Future   Continue all meds Labs pending Health Maintenance reviewed Diet and exercise encouraged RTO 2 weeks to recheck HTN  Evelina Dun, FNP

## 2018-01-15 ENCOUNTER — Other Ambulatory Visit: Payer: Self-pay | Admitting: Family

## 2018-01-15 LAB — CMP14+EGFR
ALBUMIN: 4 g/dL (ref 3.5–4.8)
ALT: 24 IU/L (ref 0–44)
AST: 27 IU/L (ref 0–40)
Albumin/Globulin Ratio: 1.5 (ref 1.2–2.2)
Alkaline Phosphatase: 77 IU/L (ref 39–117)
BUN / CREAT RATIO: 17 (ref 10–24)
BUN: 20 mg/dL (ref 8–27)
Bilirubin Total: 0.5 mg/dL (ref 0.0–1.2)
CO2: 23 mmol/L (ref 20–29)
CREATININE: 1.16 mg/dL (ref 0.76–1.27)
Calcium: 9.1 mg/dL (ref 8.6–10.2)
Chloride: 102 mmol/L (ref 96–106)
GFR calc non Af Amer: 62 mL/min/{1.73_m2} (ref >59)
GFR, EST AFRICAN AMERICAN: 72 mL/min/{1.73_m2} (ref >59)
GLUCOSE: 282 mg/dL — AB (ref 65–99)
Globulin, Total: 2.6 g/dL (ref 1.5–4.5)
Potassium: 4 mmol/L (ref 3.5–5.2)
Sodium: 143 mmol/L (ref 134–144)
Total Protein: 6.6 g/dL (ref 6.0–8.5)

## 2018-01-15 LAB — TSH: TSH: 0.898 u[IU]/mL (ref 0.450–4.500)

## 2018-01-15 LAB — MICROALBUMIN / CREATININE URINE RATIO
CREATININE, UR: 49.2 mg/dL
MICROALBUM., U, RANDOM: 24.3 ug/mL
Microalb/Creat Ratio: 49.4 mg/g creat — ABNORMAL HIGH (ref 0.0–30.0)

## 2018-01-15 LAB — VITAMIN D 25 HYDROXY (VIT D DEFICIENCY, FRACTURES): Vit D, 25-Hydroxy: 37.2 ng/mL (ref 30.0–100.0)

## 2018-01-15 MED ORDER — METFORMIN HCL 1000 MG PO TABS: 1000.0000 mg | ORAL_TABLET | Freq: Two times a day (BID) | ORAL | 3 refills | Status: DC

## 2018-01-18 ENCOUNTER — Ambulatory Visit: Payer: Medicare HMO | Admitting: Family

## 2018-01-19 ENCOUNTER — Telehealth: Payer: Self-pay | Admitting: Family

## 2018-01-19 NOTE — Telephone Encounter (Signed)
Lab results given

## 2018-01-28 ENCOUNTER — Encounter: Payer: Self-pay | Admitting: Family

## 2018-01-28 ENCOUNTER — Ambulatory Visit (INDEPENDENT_AMBULATORY_CARE_PROVIDER_SITE_OTHER): Payer: Medicare HMO | Admitting: Family

## 2018-01-28 VITALS — BP 155/90 | HR 72 | Temp 97.0°F | Ht 67.0 in | Wt 155.4 lb

## 2018-01-28 DIAGNOSIS — I10 Essential (primary) hypertension: Secondary | ICD-10-CM

## 2018-01-28 NOTE — Progress Notes (Signed)
   Subjective:    Patient ID: Robert Osborne, male    DOB: 08-Mar-1946, 72 y.o.   MRN: [id number]  Pt presents to the office today to recheck HTN. PT's BP is not at goal today.  Hypertension  This is a chronic problem. The current episode started more than 1 year ago. The problem has been waxing and waning since onset. The problem is uncontrolled. Pertinent negatives include no peripheral edema or shortness of breath. The current treatment provides mild improvement.      Review of Systems  Respiratory: Negative for shortness of breath.   All other systems reviewed and are negative.      Objective:   Physical Exam  Constitutional: He is oriented to person, place, and time. He appears well-developed and well-nourished. No distress.  HENT:  Head: Normocephalic.  Eyes: Pupils are equal, round, and reactive to light. Right eye exhibits no discharge. Left eye exhibits no discharge.  Neck: Normal range of motion. Neck supple. No thyromegaly present.  Cardiovascular: Normal rate, regular rhythm, normal heart sounds and intact distal pulses.  No murmur heard. Pulmonary/Chest: Effort normal and breath sounds normal. No respiratory distress. He has no wheezes.  Abdominal: Soft. Bowel sounds are normal. He exhibits no distension. There is no tenderness.  Musculoskeletal: Normal range of motion. He exhibits no edema or tenderness.  Neurological: He is alert and oriented to person, place, and time. He has normal reflexes. No cranial nerve deficit.  Skin: Skin is warm and dry. No rash noted. No erythema.  Psychiatric: He has a normal mood and affect. His behavior is normal. Judgment and thought content normal.  Vitals reviewed.     BP (!) 155/90   Pulse 72   Temp (!) 97 F (36.1 C) (Oral)   Ht '5\' 7"'$  (1.702 m)   Wt 155 lb 6.4 oz (70.5 kg)   BMI 24.34 kg/m      Assessment & Plan:  1. Essential hypertension -Continue medications -Dash diet information given -Exercise encouraged -  Stress Management  -Continue current meds -RTO in 3 months - BMP8+EGFR   Will stop Trazodone today since patient is feeling better and denies any Insomnia    Evelina Dun, FNP

## 2018-01-28 NOTE — Patient Instructions (Signed)

## 2018-02-23 ENCOUNTER — Other Ambulatory Visit: Payer: Self-pay | Admitting: Family

## 2018-02-23 DIAGNOSIS — E785 Hyperlipidemia, unspecified: Secondary | ICD-10-CM

## 2018-06-04 ENCOUNTER — Other Ambulatory Visit: Payer: Self-pay | Admitting: Family

## 2018-06-04 DIAGNOSIS — E785 Hyperlipidemia, unspecified: Secondary | ICD-10-CM

## 2018-06-08 ENCOUNTER — Ambulatory Visit (INDEPENDENT_AMBULATORY_CARE_PROVIDER_SITE_OTHER): Payer: Medicare HMO | Admitting: Family

## 2018-06-08 ENCOUNTER — Encounter: Payer: Self-pay | Admitting: Family

## 2018-06-08 VITALS — BP 156/91 | HR 78 | Temp 97.7°F | Ht 67.0 in | Wt 159.4 lb

## 2018-06-08 DIAGNOSIS — R69 Illness, unspecified: Secondary | ICD-10-CM | POA: Diagnosis not present

## 2018-06-08 DIAGNOSIS — I152 Hypertension secondary to endocrine disorders: Secondary | ICD-10-CM

## 2018-06-08 DIAGNOSIS — R5383 Other fatigue: Secondary | ICD-10-CM | POA: Diagnosis not present

## 2018-06-08 DIAGNOSIS — I10 Essential (primary) hypertension: Secondary | ICD-10-CM | POA: Diagnosis not present

## 2018-06-08 DIAGNOSIS — E1159 Type 2 diabetes mellitus with other circulatory complications: Secondary | ICD-10-CM | POA: Diagnosis not present

## 2018-06-08 DIAGNOSIS — F0391 Unspecified dementia with behavioral disturbance: Secondary | ICD-10-CM | POA: Diagnosis not present

## 2018-06-08 DIAGNOSIS — E785 Hyperlipidemia, unspecified: Secondary | ICD-10-CM | POA: Diagnosis not present

## 2018-06-08 DIAGNOSIS — E119 Type 2 diabetes mellitus without complications: Secondary | ICD-10-CM

## 2018-06-08 DIAGNOSIS — E1169 Type 2 diabetes mellitus with other specified complication: Secondary | ICD-10-CM | POA: Diagnosis not present

## 2018-06-08 LAB — BAYER DCA HB A1C WAIVED: HB A1C (BAYER DCA - WAIVED): 7.6 % — ABNORMAL HIGH

## 2018-06-08 MED ORDER — OLANZAPINE 2.5 MG PO TABS: 2.5000 mg | ORAL_TABLET | Freq: Every day | ORAL | 1 refills | Status: DC

## 2018-06-08 NOTE — Addendum Note (Signed)
Addended by: Evelina Dun A on: 06/08/2018 03:38 PM   Modules accepted: Orders

## 2018-06-08 NOTE — Patient Instructions (Signed)

## 2018-06-08 NOTE — Progress Notes (Signed)
Subjective:    Patient ID: Robert Osborne, male    DOB: November 03, 1946, 72 y.o.   MRN: [id number]  Chief Complaint  Patient presents with  . Medical Management of Chronic Issues    fatigue   Pt presents to the office today for chronic follow up. States he is fatigued. He reports his dementia and insomnia has greatly improved.  Diabetes  He presents for his follow-up diabetic visit. He has type 2 diabetes mellitus. His disease course has been stable. There are no hypoglycemic associated symptoms. Associated symptoms include blurred vision and visual change. Pertinent negatives for diabetes include no foot paresthesias. There are no hypoglycemic complications. Symptoms are stable. Pertinent negatives for diabetic complications include no CVA or heart disease. Risk factors for coronary artery disease include dyslipidemia, diabetes mellitus, male sex, hypertension and sedentary lifestyle. His weight is stable. He is following a generally healthy diet. (Does not check BS at home ) Eye exam is current.  Hypertension  This is a chronic problem. The current episode started more than 1 year ago. The problem has been waxing and waning since onset. The problem is uncontrolled. Associated symptoms include blurred vision and malaise/fatigue. Pertinent negatives include no peripheral edema or shortness of breath. Risk factors for coronary artery disease include dyslipidemia, obesity and male gender. The current treatment provides mild improvement. There is no history of CVA.  Hyperlipidemia  This is a chronic problem. The current episode started more than 1 year ago. The problem is controlled. Recent lipid tests were reviewed and are normal. Pertinent negatives include no shortness of breath. Current antihyperlipidemic treatment includes statins. The current treatment provides moderate improvement of lipids.      Review of Systems  Constitutional: Positive for malaise/fatigue.  Eyes: Positive for blurred  vision.  Respiratory: Negative for shortness of breath.   All other systems reviewed and are negative.      Objective:   Physical Exam  Constitutional: He is oriented to person, place, and time. He appears well-developed and well-nourished. No distress.  HENT:  Head: Normocephalic.  Right Ear: External ear normal.  Left Ear: External ear normal.  Mouth/Throat: Oropharynx is clear and moist.  Eyes: Pupils are equal, round, and reactive to light. Right eye exhibits no discharge. Left eye exhibits no discharge.  Neck: Normal range of motion. Neck supple. No thyromegaly present.  Cardiovascular: Normal rate, regular rhythm, normal heart sounds and intact distal pulses.  No murmur heard. Pulmonary/Chest: Effort normal and breath sounds normal. No respiratory distress. He has no wheezes.  Abdominal: Soft. Bowel sounds are normal. He exhibits no distension. There is no tenderness.  Musculoskeletal: Normal range of motion. He exhibits no edema or tenderness.  Neurological: He is alert and oriented to person, place, and time. He has normal reflexes. No cranial nerve deficit.  Skin: Skin is warm and dry. No rash noted. No erythema.  Psychiatric: He has a normal mood and affect. His behavior is normal. Judgment and thought content normal.  Vitals reviewed.     BP (!) 156/91   Pulse 78   Temp 97.7 F (36.5 C) (Oral)   Ht _0  (1.702 m)   Wt 159 lb 6.4 oz (72.3 kg)   BMI 24.97 kg/m      Assessment & Plan:  THADD APUZZO comes in today with chief complaint of Medical Management of Chronic Issues (fatigue)   Diagnosis and orders addressed:  1. Hyperlipidemia associated with type 2 diabetes mellitus (HCC) - CMP14+EGFR - Lipid  panel - Ambulatory referral to Cardiology  2. Hypertension associated with diabetes ([place]) - CMP14+EGFR - Ambulatory referral to Cardiology  3. Diabetes mellitus without complication ([place]) - KRC38+[facility] - Bayer DCA Hb A1c Waived - Ambulatory referral to  Cardiology  4. Dementia with behavioral disturbance, unspecified dementia type Will decrease Zyprexa to 2.5 mg from 5 mg  - OLANZapine (ZYPREXA) 2.5 MG tablet; Take 1 tablet (2.5 mg total) by mouth at bedtime.  Dispense: 30 tablet; Refill: 1 - CMP14+EGFR  5. Fatigue, unspecified type Rest - Ambulatory referral to Cardiology - EKG 12-Lead   Labs pending Health Maintenance reviewed Diet and exercise encouraged  Follow up plan: 3 months    Evelina Dun, FNP

## 2018-06-09 LAB — LIPID PANEL
CHOLESTEROL TOTAL: 121 mg/dL (ref 100–199)
Chol/HDL Ratio: 2.6 ratio (ref 0.0–5.0)
HDL: 47 mg/dL (ref >39)
LDL Calculated: 58 mg/dL (ref 0–99)
Triglycerides: 80 mg/dL (ref 0–149)
VLDL Cholesterol Cal: 16 mg/dL (ref 5–40)

## 2018-06-09 LAB — CMP14+EGFR
ALT: 13 IU/L (ref 0–44)
AST: 23 IU/L (ref 0–40)
Albumin/Globulin Ratio: 1.7 (ref 1.2–2.2)
Albumin: 4.3 g/dL (ref 3.5–4.8)
Alkaline Phosphatase: 85 IU/L (ref 39–117)
BUN / CREAT RATIO: 12 (ref 10–24)
BUN: 14 mg/dL (ref 8–27)
Bilirubin Total: 0.9 mg/dL (ref 0.0–1.2)
CALCIUM: 9.7 mg/dL (ref 8.6–10.2)
CHLORIDE: 102 mmol/L (ref 96–106)
CO2: 26 mmol/L (ref 20–29)
CREATININE: 1.14 mg/dL (ref 0.76–1.27)
GFR calc Af Amer: 73 mL/min/{1.73_m2} (ref >59)
GFR calc non Af Amer: 63 mL/min/{1.73_m2} (ref >59)
GLUCOSE: 148 mg/dL — AB (ref 65–99)
Globulin, Total: 2.5 g/dL (ref 1.5–4.5)
POTASSIUM: 3.4 mmol/L — AB (ref 3.5–5.2)
Sodium: 144 mmol/L (ref 134–144)
TOTAL PROTEIN: 6.8 g/dL (ref 6.0–8.5)

## 2018-06-11 ENCOUNTER — Ambulatory Visit: Payer: Medicare HMO | Admitting: Family

## 2018-06-22 DIAGNOSIS — Z833 Family history of diabetes mellitus: Secondary | ICD-10-CM | POA: Diagnosis not present

## 2018-06-22 DIAGNOSIS — Z87891 Personal history of nicotine dependence: Secondary | ICD-10-CM | POA: Diagnosis not present

## 2018-06-22 DIAGNOSIS — Z7984 Long term (current) use of oral hypoglycemic drugs: Secondary | ICD-10-CM | POA: Diagnosis not present

## 2018-06-22 DIAGNOSIS — J309 Allergic rhinitis, unspecified: Secondary | ICD-10-CM | POA: Diagnosis not present

## 2018-06-22 DIAGNOSIS — Z823 Family history of stroke: Secondary | ICD-10-CM | POA: Diagnosis not present

## 2018-06-22 DIAGNOSIS — I10 Essential (primary) hypertension: Secondary | ICD-10-CM | POA: Diagnosis not present

## 2018-06-22 DIAGNOSIS — R69 Illness, unspecified: Secondary | ICD-10-CM | POA: Diagnosis not present

## 2018-06-22 DIAGNOSIS — E785 Hyperlipidemia, unspecified: Secondary | ICD-10-CM | POA: Diagnosis not present

## 2018-06-22 DIAGNOSIS — E119 Type 2 diabetes mellitus without complications: Secondary | ICD-10-CM | POA: Diagnosis not present

## 2018-08-10 NOTE — Progress Notes (Signed)
Cardiology Office Note   Date:  08/11/2018   ID:  Robert Osborne [birth year], MRN [id number]  PCP:  Robert Balloon, FNP  Cardiologist:   No primary care provider on file.   Chief Complaint  Patient presents with  . Abnormal Stress Test      History of Present Illness: Robert Osborne is a 72 y.o. male who is referred by Robert Balloon, FNP for evaluation of HTN, DM and hyperlipidemia.    I did evaluate him a few year ago for work up of dyspnea.  He had a intermediate risk POET (Plain Old Exercise Treadmill).  However, he never answered our emails or letters to schedule a perfusion study.  I did review these results then and for this appointment and actually would suggest that there were not significant ischemic changes.  He is finally referred back and comes in for evaluation and he is here with his wife.  He is actually quite vigorous.  He does a lot of activity.  He does Biomedical scientist.  With this he denies any cardiovascular symptoms. The patient denies any new symptoms such as chest discomfort, neck or arm discomfort. There has been no new shortness of breath, PND or orthopnea. There have been no reported palpitations, presyncope or syncope.    Past Medical History:  Diagnosis Date  . Bladder cancer ([place]) 08/18/2013  . Cancer [facility])    papillary urethral carcinoma,low grade, non-invasive  . Diabetes mellitus without complication ([place])   . Erectile dysfunction 10/24/2016  . Hyperlipidemia associated with type 2 diabetes mellitus ([place]) 05/17/2013  . Hypertension associated with diabetes ([place])   . Hypokalemia 11/27/2017  . Hypomagnesemia 11/27/2017  . Major frontotemporal neurocognitive disorder, probable, with behavioral disturbance 11/27/2017  . Vitamin D deficiency 05/17/2013    Past Surgical History:  Procedure Laterality Date  . COLONOSCOPY W/ POLYPECTOMY       Current Outpatient Medications  Medication Sig Dispense Refill  . amLODipine (NORVASC) 10 MG tablet  Take 1 tablet (10 mg total) by mouth daily. 90 tablet 3  . atorvastatin (LIPITOR) 40 MG tablet TAKE 1 TABLET BY MOUTH ONCE DAILY 90 tablet 0  . Cholecalciferol (VITAMIN D-3) 5000 UNITS TABS Take 1,000 Int'l Units by mouth daily.     . metFORMIN (GLUCOPHAGE) 1000 MG tablet Take 1 tablet (1,000 mg total) by mouth 2 (two) times daily with a meal. 180 tablet 3  . OLANZapine (ZYPREXA) 2.5 MG tablet Take 1 tablet (2.5 mg total) by mouth at bedtime. 30 tablet 1  . sildenafil (REVATIO) 20 MG tablet Take 2-5 tablets as needed for sexual activity 50 tablet 0  . vitamin B-12 (CYANOCOBALAMIN) 1000 MCG tablet Take 1,000 mcg by mouth daily.    . blood glucose meter kit and supplies KIT Dispense based on patient and insurance preference. Use up to twice a daily as directed. (FOR ICD-10 - type 2 DM uncontrolled E11.9) 1 each 0  . losartan (COZAAR) 25 MG tablet Take 1 tablet (25 mg total) by mouth daily. 90 tablet 3  . ONE TOUCH ULTRA TEST test strip     . ONETOUCH DELICA LANCETS 74Q MISC      No current facility-administered medications for this visit.     Allergies:   Enalapril     ROS:  Please see the history of present illness.   Otherwise, review of systems are positive for none.   All other systems are reviewed and negative.    PHYSICAL EXAM: VS:  BP (!) 142/70   Pulse 88   Ht '5\' 7"'$  (1.702 m)   Wt 158 lb (71.7 kg)   BMI 24.75 kg/m  , BMI Body mass index is 24.75 kg/m. GENERAL:  Well appearing HEENT:  Pupils equal round and reactive, fundi not visualized, oral mucosa unremarkable NECK:  No jugular venous distention, waveform within normal limits, carotid upstroke brisk and symmetric, no bruits, no thyromegaly LYMPHATICS:  No cervical, inguinal adenopathy LUNGS:  Clear to auscultation bilaterally BACK:  No CVA tenderness CHEST:  Unremarkable HEART:  PMI not displaced or sustained,S1 and S2 within normal limits, no S3, no S4, no clicks, no rubs, no murmurs ABD:  Flat, positive bowel sounds  normal in frequency in pitch, no bruits, no rebound, no guarding, no midline pulsatile mass, no hepatomegaly, no splenomegaly EXT:  2 plus pulses throughout, no edema, no cyanosis no clubbing SKIN:  No rashes no nodules NEURO:  Cranial nerves II through XII grossly intact, motor grossly intact throughout PSYCH:  Cognitively intact, oriented to person place and time    EKG:  EKG is ordered today. The ekg ordered today demonstrates normal sinus rhythm, no acute ST-T wave changes, premature ventricular contractions.   Recent Labs: 11/20/2017: Hemoglobin 15.3; Platelets 263 12/16/2017: Magnesium 1.7 01/14/2018: TSH 0.898 06/08/2018: ALT 13; BUN 14; Creatinine, Ser 1.14; Potassium 3.4; Sodium 144    Lipid Panel    Component Value Date/Time   CHOL 121 06/08/2018 1554   CHOL 125 05/17/2013 1513   TRIG 80 06/08/2018 1554   TRIG 139 02/21/2014 1632   TRIG 125 05/17/2013 1513   HDL 47 06/08/2018 1554   HDL 47 02/21/2014 1632   HDL 39 (L) 05/17/2013 1513   CHOLHDL 2.6 06/08/2018 1554   LDLCALC 58 06/08/2018 1554   LDLCALC 61 02/21/2014 1632   LDLCALC 61 05/17/2013 1513      Wt Readings from Last 3 Encounters:  08/11/18 158 lb (71.7 kg)  08/11/18 156 lb 9.6 oz (71 kg)  06/08/18 159 lb 6.4 oz (72.3 kg)      Other studies Reviewed: Additional studies/ records that were reviewed today include: POET (Plain Old Exercise Treadmill) 2017. Review of the above records demonstrates:  Please see elsewhere in the note.     ASSESSMENT AND PLAN:  HTN: Blood pressure is not quite controlled.  I am going to add Cozaar 25 mg daily.  He can now have this followed by Robert Balloon, FNP  DM:   His blood sugars are slightly elevated with a slightly elevated A1c but this can follow with diet and meds as listed.  DYSLIPIDEMIA: His lipids are at target.  No change in therapy.  STRESS TEST: There was an equivocal stress test but I reviewed this and did not see ischemic changes and he is very active  and has absolutely no symptoms.  There were no high risk findings on previous testing.  Given this he needs continued primary risk reduction but no further stress testing or other imaging.  PVCs: He does not notice these.  No change in therapy.   Current medicines are reviewed at length with the patient today.  The patient does not have concerns regarding medicines.  The following changes have been made:  no change  Labs/ tests ordered today include: None No orders of the defined types were placed in this encounter.    Disposition:   FU with me as needed     Signed, Minus Breeding, MD  08/11/2018 5:16 PM  [facility]

## 2018-08-11 ENCOUNTER — Ambulatory Visit: Payer: Medicare HMO | Admitting: Cardiology

## 2018-08-11 ENCOUNTER — Ambulatory Visit (INDEPENDENT_AMBULATORY_CARE_PROVIDER_SITE_OTHER): Payer: Medicare HMO | Admitting: Family

## 2018-08-11 ENCOUNTER — Encounter: Payer: Self-pay | Admitting: Family

## 2018-08-11 ENCOUNTER — Encounter: Payer: Self-pay | Admitting: Cardiology

## 2018-08-11 VITALS — BP 142/70 | HR 88 | Ht 67.0 in | Wt 158.0 lb

## 2018-08-11 VITALS — BP 160/91 | HR 78 | Temp 97.6°F | Ht 67.0 in | Wt 156.6 lb

## 2018-08-11 DIAGNOSIS — L720 Epidermal cyst: Secondary | ICD-10-CM

## 2018-08-11 DIAGNOSIS — E785 Hyperlipidemia, unspecified: Secondary | ICD-10-CM | POA: Diagnosis not present

## 2018-08-11 DIAGNOSIS — I10 Essential (primary) hypertension: Secondary | ICD-10-CM | POA: Diagnosis not present

## 2018-08-11 DIAGNOSIS — R9439 Abnormal result of other cardiovascular function study: Secondary | ICD-10-CM

## 2018-08-11 DIAGNOSIS — L700 Acne vulgaris: Secondary | ICD-10-CM

## 2018-08-11 DIAGNOSIS — N529 Male erectile dysfunction, unspecified: Secondary | ICD-10-CM

## 2018-08-11 DIAGNOSIS — I493 Ventricular premature depolarization: Secondary | ICD-10-CM | POA: Diagnosis not present

## 2018-08-11 MED ORDER — SILDENAFIL CITRATE 20 MG PO TABS: ORAL_TABLET | ORAL | 0 refills | Status: DC

## 2018-08-11 MED ORDER — LOSARTAN POTASSIUM 25 MG PO TABS: 25.0000 mg | ORAL_TABLET | Freq: Every day | ORAL | 3 refills | Status: DC

## 2018-08-11 NOTE — Progress Notes (Signed)
   Subjective:    Patient ID: Robert Osborne, male    DOB: August 10, 1946, 72 y.o.   MRN: [id number]  Chief Complaint  Patient presents with  . cyst on back    HPI PT presents to the office today with a cyst on his lower back that has been there for years. States he has had it "squeezed" before, but comes back. Denies any pain or redness.   PT also asking for generic sildenafil rx. States his cialis works, but is very expensive.    Review of Systems  Gastrointestinal: Positive for diarrhea.  Skin:       Cyst   All other systems reviewed and are negative.      Objective:   Physical Exam  Constitutional: He is oriented to person, place, and time. He appears well-developed and well-nourished. No distress.  HENT:  Head: Normocephalic.  Eyes: Pupils are equal, round, and reactive to light. Right eye exhibits no discharge. Left eye exhibits no discharge.  Neck: Normal range of motion. Neck supple. No thyromegaly present.  Cardiovascular: Normal rate, regular rhythm, normal heart sounds and intact distal pulses.  No murmur heard. Pulmonary/Chest: Effort normal and breath sounds normal. No respiratory distress. He has no wheezes.  Abdominal: Soft. Bowel sounds are normal. He exhibits no distension. There is no tenderness.  Musculoskeletal: Normal range of motion. He exhibits no edema or tenderness.  Neurological: He is alert and oriented to person, place, and time. He has normal reflexes. No cranial nerve deficit.  Skin: Skin is warm and dry. No rash noted. Lesion: comedone present on lower back approx 1.8X2cm and right clavicle bone   No erythema.     Psychiatric: He has a normal mood and affect. His behavior is normal. Judgment and thought content normal.  Vitals reviewed.  Area cleaned and small incision made. Moderate  amount of thick, white, foul smelling discharge removed. Bandaid applied.   BP (!) 160/91   Pulse 78   Temp 97.6 F (36.4 C) (Oral)   Ht 5\' 7"  (1.702 m)   Wt  156 lb 9.6 oz (71 kg)   BMI 24.53 kg/m       Assessment & Plan:  Robert Osborne comes in today with chief complaint of cyst on back   Diagnosis and orders addressed:  1. [place]   2. Epidermal cyst Keep clean and dry  Do not pick at Report any s/s of infection  3. Erectile dysfunction, unspecified erectile dysfunction type Do not take with any other vasodilators Avoid Alcohol   - sildenafil (REVATIO) 20 MG tablet; Take 2-5 tablets as needed for sexual activity  Dispense: 50 tablet; Refill: 0   Evelina Dun, FNP

## 2018-08-11 NOTE — Patient Instructions (Signed)
Medication Instructions:  Please start Losartan 25 mg daily. Continue all other medications as listed.  Follow-Up: Follow up as needed with Dr Percival Spanish  If you need a refill on your cardiac medications before your next appointment, please call your pharmacy.  Thank you for choosing [facility]!!

## 2018-08-11 NOTE — Patient Instructions (Signed)
Epidermal Cyst An epidermal cyst is sometimes called an epidermal inclusion cyst or an infundibular cyst. It is a sac made of skin tissue. The sac contains a substance called keratin. Keratin is a protein that is normally secreted through the hair follicles. When keratin becomes trapped in the top layer of skin (epidermis), it can form an epidermal cyst. Epidermal cysts are usually found on the face, neck, trunk, and genitals. These cysts are usually harmless (benign), and they may not cause symptoms unless they become infected. It is important not to pop epidermal cysts yourself. What are the causes? This condition may be caused by:  A blocked hair follicle.  A hair that curls and re-enters the skin instead of growing straight out of the skin (ingrown hair).  A blocked pore.  Irritated skin.  An injury to the skin.  Certain conditions that are passed along from parent to child (inherited).  Human papillomavirus (HPV).  What increases the risk? The following factors may make you more likely to develop an epidermal cyst:  Having acne.  Being overweight.  Wearing tight clothing.  What are the signs or symptoms? The only symptom of this condition may be a small, painless lump underneath the skin. When an epidermal cyst becomes infected, symptoms may include:  Redness.  Inflammation.  Tenderness.  Warmth.  Fever.  Keratin draining from the cyst. Keratin may look like a grayish-white, bad-smelling substance.  Pus draining from the cyst.  How is this diagnosed? This condition is diagnosed with a physical exam. In some cases, you may have a sample of tissue (biopsy) taken from your cyst to be examined under a microscope or tested for bacteria. You may be referred to a health care provider who specializes in skin care (dermatologist). How is this treated? In many cases, epidermal cysts go away on their own without treatment. If a cyst becomes infected, treatment may  include:  Opening and draining the cyst. After draining, minor surgery to remove the rest of the cyst may be done.  Antibiotic medicine to help prevent infection.  Injections of medicines (steroids) that help to reduce inflammation.  Surgery to remove the cyst. Surgery may be done if: ? The cyst becomes large. ? The cyst bothers you. ? There is a chance that the cyst could turn into cancer.  Follow these instructions at home:  Take over-the-counter and prescription medicines only as told by your health care provider.  If you were prescribed an antibiotic, use it as told by your health care provider. Do not stop using the antibiotic even if you start to feel better.  Keep the area around your cyst clean and dry.  Wear loose, dry clothing.  Do not try to pop your cyst.  Avoid touching your cyst.  Check your cyst every day for signs of infection.  Keep all follow-up visits as told by your health care provider. This is important. How is this prevented?  Wear clean, dry, clothing.  Avoid wearing tight clothing.  Keep your skin clean and dry. Shower or take baths every day.  Wash your body with a benzoyl peroxide wash when you shower or bathe. Contact a health care provider if:  Your cyst develops symptoms of infection.  Your condition is not improving or is getting worse.  You develop a cyst that looks different from other cysts you have had.  You have a fever. Get help right away if:  Redness spreads from the cyst into the surrounding area. This information is   not intended to replace advice given to you by your health care provider. Make sure you discuss any questions you have with your health care provider. Document Released: 11/01/2004 Document Revised: 07/30/2016 Document Reviewed: 10/03/2015 Elsevier Interactive Patient Education  2018 Elsevier Inc.  

## 2018-09-09 DIAGNOSIS — Z7984 Long term (current) use of oral hypoglycemic drugs: Secondary | ICD-10-CM | POA: Diagnosis not present

## 2018-09-09 DIAGNOSIS — H52223 Regular astigmatism, bilateral: Secondary | ICD-10-CM | POA: Diagnosis not present

## 2018-09-09 DIAGNOSIS — H2513 Age-related nuclear cataract, bilateral: Secondary | ICD-10-CM | POA: Diagnosis not present

## 2018-09-09 DIAGNOSIS — H5211 Myopia, right eye: Secondary | ICD-10-CM | POA: Diagnosis not present

## 2018-09-09 DIAGNOSIS — H524 Presbyopia: Secondary | ICD-10-CM | POA: Diagnosis not present

## 2018-09-09 DIAGNOSIS — E119 Type 2 diabetes mellitus without complications: Secondary | ICD-10-CM | POA: Diagnosis not present

## 2018-09-10 ENCOUNTER — Other Ambulatory Visit: Payer: Self-pay | Admitting: Family

## 2018-09-10 DIAGNOSIS — F0391 Unspecified dementia with behavioral disturbance: Secondary | ICD-10-CM

## 2018-09-10 DIAGNOSIS — R69 Illness, unspecified: Secondary | ICD-10-CM | POA: Diagnosis not present

## 2018-09-10 DIAGNOSIS — E785 Hyperlipidemia, unspecified: Secondary | ICD-10-CM

## 2018-10-18 DIAGNOSIS — H0102B Squamous blepharitis left eye, upper and lower eyelids: Secondary | ICD-10-CM | POA: Diagnosis not present

## 2018-10-18 DIAGNOSIS — H0102A Squamous blepharitis right eye, upper and lower eyelids: Secondary | ICD-10-CM | POA: Diagnosis not present

## 2018-10-18 DIAGNOSIS — E119 Type 2 diabetes mellitus without complications: Secondary | ICD-10-CM | POA: Diagnosis not present

## 2018-10-18 DIAGNOSIS — H2513 Age-related nuclear cataract, bilateral: Secondary | ICD-10-CM | POA: Diagnosis not present

## 2018-10-18 DIAGNOSIS — H40033 Anatomical narrow angle, bilateral: Secondary | ICD-10-CM | POA: Diagnosis not present

## 2018-10-18 DIAGNOSIS — H11823 Conjunctivochalasis, bilateral: Secondary | ICD-10-CM | POA: Diagnosis not present

## 2018-10-18 DIAGNOSIS — H11153 Pinguecula, bilateral: Secondary | ICD-10-CM | POA: Diagnosis not present

## 2018-10-25 ENCOUNTER — Other Ambulatory Visit: Payer: Self-pay | Admitting: *Deleted

## 2018-10-25 DIAGNOSIS — F0391 Unspecified dementia with behavioral disturbance: Secondary | ICD-10-CM

## 2018-10-25 NOTE — Telephone Encounter (Signed)
Please call our office to schedule a follow up to continue receiving refills on medications.

## 2018-10-25 NOTE — Telephone Encounter (Signed)
[place]. NTBS. 30 days given 09/10/18

## 2018-10-26 DIAGNOSIS — H40033 Anatomical narrow angle, bilateral: Secondary | ICD-10-CM | POA: Diagnosis not present

## 2018-11-02 DIAGNOSIS — H40032 Anatomical narrow angle, left eye: Secondary | ICD-10-CM | POA: Diagnosis not present

## 2018-11-03 ENCOUNTER — Ambulatory Visit (INDEPENDENT_AMBULATORY_CARE_PROVIDER_SITE_OTHER): Payer: Medicare HMO | Admitting: Family

## 2018-11-03 ENCOUNTER — Encounter: Payer: Self-pay | Admitting: Family

## 2018-11-03 VITALS — BP 140/90 | HR 94 | Temp 97.4°F | Ht 67.0 in | Wt 158.4 lb

## 2018-11-03 DIAGNOSIS — E1159 Type 2 diabetes mellitus with other circulatory complications: Secondary | ICD-10-CM | POA: Diagnosis not present

## 2018-11-03 DIAGNOSIS — E785 Hyperlipidemia, unspecified: Secondary | ICD-10-CM | POA: Diagnosis not present

## 2018-11-03 DIAGNOSIS — I10 Essential (primary) hypertension: Secondary | ICD-10-CM

## 2018-11-03 DIAGNOSIS — R29898 Other symptoms and signs involving the musculoskeletal system: Secondary | ICD-10-CM

## 2018-11-03 DIAGNOSIS — E1169 Type 2 diabetes mellitus with other specified complication: Secondary | ICD-10-CM

## 2018-11-03 DIAGNOSIS — I152 Hypertension secondary to endocrine disorders: Secondary | ICD-10-CM

## 2018-11-03 DIAGNOSIS — E119 Type 2 diabetes mellitus without complications: Secondary | ICD-10-CM | POA: Diagnosis not present

## 2018-11-03 DIAGNOSIS — R69 Illness, unspecified: Secondary | ICD-10-CM | POA: Diagnosis not present

## 2018-11-03 DIAGNOSIS — IMO0001 Reserved for inherently not codable concepts without codable children: Secondary | ICD-10-CM

## 2018-11-03 DIAGNOSIS — F0391 Unspecified dementia with behavioral disturbance: Secondary | ICD-10-CM

## 2018-11-03 DIAGNOSIS — F0281 Dementia in other diseases classified elsewhere with behavioral disturbance: Secondary | ICD-10-CM

## 2018-11-03 DIAGNOSIS — G3109 Other frontotemporal dementia: Secondary | ICD-10-CM

## 2018-11-03 LAB — BAYER DCA HB A1C WAIVED: HB A1C: 8 % — AB (ref <7.0)

## 2018-11-03 MED ORDER — OLANZAPINE 2.5 MG PO TABS: 2.5000 mg | ORAL_TABLET | Freq: Every day | ORAL | 1 refills | Status: DC

## 2018-11-03 MED ORDER — ATORVASTATIN CALCIUM 40 MG PO TABS: 40.0000 mg | ORAL_TABLET | Freq: Every day | ORAL | 1 refills | Status: DC

## 2018-11-03 NOTE — Progress Notes (Signed)
Subjective:    Patient ID: Robert Osborne, male    DOB: January 16, 1946, 72 y.o.   MRN: [id number]  Chief Complaint  Patient presents with  . Medical Management of Chronic Issues   Pt presents to the office today for chronic follow up. States he is fatigued and having bilateral leg weakness. He did see a Cardiologists on 08/11/18 for his fatigue and was cleared.  Diabetes  He presents for his follow-up diabetic visit. He has type 2 diabetes mellitus. His disease course has been stable. There are no hypoglycemic associated symptoms. Associated symptoms include fatigue and weakness. Pertinent negatives for diabetes include no blurred vision, no foot paresthesias and no visual change. Symptoms are stable. Pertinent negatives for diabetic complications include no CVA or heart disease. Risk factors for coronary artery disease include diabetes mellitus, dyslipidemia, male sex, hypertension and sedentary lifestyle. His weight is stable. He is following a generally healthy diet. (Does not check BS at home ) Eye exam is current.  Hypertension  This is a chronic problem. The current episode started more than 1 year ago. The problem has been waxing and waning since onset. The problem is controlled. Associated symptoms include malaise/fatigue. Pertinent negatives include no blurred vision, peripheral edema or shortness of breath. Risk factors for coronary artery disease include dyslipidemia, diabetes mellitus and male gender. The current treatment provides mild improvement. There is no history of CVA.  Hyperlipidemia  This is a chronic problem. The current episode started more than 1 year ago. The problem is controlled. Recent lipid tests were reviewed and are normal. Pertinent negatives include no shortness of breath. Current antihyperlipidemic treatment includes statins. The current treatment provides moderate improvement of lipids. Risk factors for coronary artery disease include dyslipidemia, hypertension and  male sex.      Review of Systems  Constitutional: Positive for fatigue and malaise/fatigue.  Eyes: Negative for blurred vision.  Respiratory: Negative for shortness of breath.   Neurological: Positive for weakness.  All other systems reviewed and are negative.      Objective:   Physical Exam  Constitutional: He is oriented to person, place, and time. He appears well-developed and well-nourished. No distress.  HENT:  Head: Normocephalic.  Right Ear: External ear normal.  Left Ear: External ear normal.  Mouth/Throat: Oropharynx is clear and moist.  Eyes: Pupils are equal, round, and reactive to light. Right eye exhibits no discharge. Left eye exhibits no discharge.  Neck: Normal range of motion. Neck supple. No thyromegaly present.  Cardiovascular: Normal rate, regular rhythm, normal heart sounds and intact distal pulses.  No murmur heard. Pulmonary/Chest: Effort normal and breath sounds normal. No respiratory distress. He has no wheezes.  Abdominal: Soft. Bowel sounds are normal. He exhibits no distension. There is no tenderness.  Musculoskeletal: Normal range of motion. He exhibits no edema or tenderness.  Full ROM and good strength bilaterally   Neurological: He is alert and oriented to person, place, and time. He has normal reflexes. No cranial nerve deficit.  Skin: Skin is warm and dry. No rash noted. No erythema.  Psychiatric: He has a normal mood and affect. His behavior is normal. Judgment and thought content normal.  Vitals reviewed.   Diabetic Foot Exam - Simple   Simple Foot Form Diabetic Foot exam was performed with the following findings:  Yes 11/03/2018  3:46 PM  Visual Inspection No deformities, no ulcerations, no other skin breakdown bilaterally:  Yes Sensation Testing Intact to touch and monofilament testing bilaterally:  Yes Pulse  Check Posterior Tibialis and Dorsalis pulse intact bilaterally:  Yes Comments      BP 140/90   Pulse 94   Temp (!) 97.4  F (36.3 C) (Oral)   Ht _0  (1.702 m)   Wt 158 lb 6.4 oz (71.8 kg)   BMI 24.81 kg/m      Assessment & Plan:  [name] comes in today with chief complaint of Medical Management of Chronic Issues   Diagnosis and orders addressed:  1. Dementia with behavioral disturbance, unspecified dementia type (HCC) - OLANZapine (ZYPREXA) 2.5 MG tablet; Take 1 tablet (2.5 mg total) by mouth at bedtime. (Needs to be seen before next refill)  Dispense: 90 tablet; Refill: 1 - CMP14+EGFR  2. Hypertension associated with diabetes ([place]) - CMP14+EGFR  3. Hyperlipidemia associated with type 2 diabetes mellitus (HCC) - atorvastatin (LIPITOR) 40 MG tablet; Take 1 tablet (40 mg total) by mouth daily.  Dispense: 90 tablet; Refill: 1 - CMP14+EGFR - Lipid panel  4. Diabetes mellitus without complication ([place]) - Bayer DCA Hb A1c Waived - CMP14+EGFR  5. Major frontotemporal neurocognitive disorder, probable, with behavioral disturbance ([place]) - CMP14+EGFR  6. Bilateral leg weakness - Ambulatory referral to Physical Therapy   Labs pending Health Maintenance reviewed Diet and exercise encouraged  Follow up plan: 6 months    Evelina Dun, FNP

## 2018-11-03 NOTE — Patient Instructions (Signed)
Deconditioning °Deconditioning refers to the changes in your body that occur during a period of inactivity. The changes happen in your heart, lungs, and muscles. They decrease your ability to be active, and they make you feel tired and weak. °There are three stages of deconditioning: °· Mild deconditioning. At this stage, you will notice a change in your ability to do your usual exercise activities, such as running, biking, or swimming. °· Moderate deconditioning. At this stage, you will notice a change in your ability to do normal everyday activities, such as walking, grocery shopping, and doing chores. °· Severe deconditioning. At this stage, you will notice a change in your ability to do minimal activity or normal self-care. ° °Deconditioning can occur after only a few days of inactivity. The longer the period of inactivity, the more severe the deconditioning will be, and the longer it will take to return to your previous level of functioning. °What are the causes? °Deconditioning is often caused by inactivity due to: °· Illnesses, such as cancer, stroke, heart attack, fibromyalgia, and chronic fatigue syndrome. °· Injuries, especially back injuries, broken bones, and ligament and tendon injuries. °· A long stay in the hospital. °· Pregnancy, especially if long periods of bed rest are needed. ° °What increases the risk? °This condition is more likely to develop in: °· People who are hospitalized. °· People on bed rest. °· People who are obese. °· People with poor nutrition. °· Elderly adults. °· People with injuries or illnesses that interfere with movement and activity. ° °What are the signs or symptoms? °Symptoms of deconditioning include: °· Weakness. °· Tiredness. °· Shortness of breath with minor exertion. °· A faster-than-normal heartbeat. You may not notice this without taking your pulse. °· Pain or discomfort with activity. °· Decreased strength. °· Decreased sense of balance. °· Decreased  endurance. °· Difficulty doing your usual forms of exercise. °· Difficulty doing activities of daily living, such as grocery shopping or chores. °· Difficulty walking around the house and doing basic self-care, such as getting to the bathroom, preparing meals, or doing laundry. ° °How is this diagnosed? °Deconditioning is diagnosed based on your medical history and a physical exam. During the physical exam, your health care provider will check for signs of deconditioning, such as: °· Decreased size of muscles. °· Decreased strength. °· Trouble with balance. °· Shortness of breath or abnormally increased heart rate after minor exertion. ° °How is this treated? °Treatment for deconditioning usually involves following a structured exercise program in which activity is increased gradually. Your health care provider will determine which exercises are right for you. The exercise program will likely include aerobic exercise and strength training: °· Aerobic exercise helps improve the functioning of the heart and lungs as well as the muscles. °· Strength training helps improve muscle size and strength. ° °Both of these types of exercise will improve your endurance. You may be referred to a physical therapist who can create a safe strengthening program for you to follow. °Follow these instructions at home: °· Follow the exercise program that is recommended by your health care provider or physical therapist. °· Do not increase your exercise any faster than directed. °· Eat a healthy diet. °· Do not use any products that contain nicotine or tobacco, such as cigarettes and e-cigarettes. If you need help quitting, ask your health care provider. °· Take over-the-counter and prescription medicines only as told by your health care provider. °· Keep all follow-up visits as told by your health care   provider. This is important. °Contact a health care provider if: °· You are not able to carry out the prescribed exercise program. °· You  are becoming more and more fatigued and weak. °· You become light-headed when rising to a sitting or standing position. °· Your level of endurance decreases after it has improved. °Get help right away if: °· You have chest pain. °· You are very short of breath. °· You have any episodes of passing out. °This information is not intended to replace advice given to you by your health care provider. Make sure you discuss any questions you have with your health care provider. °Document Released: 04/17/2014 Document Revised: 06/20/2016 Document Reviewed: 03/01/2016 °Elsevier Interactive Patient Education © 2018 [employer] Inc. ° °

## 2018-11-04 ENCOUNTER — Other Ambulatory Visit: Payer: Self-pay | Admitting: Family

## 2018-11-04 LAB — CMP14+EGFR
ALBUMIN: 4.3 g/dL (ref 3.5–4.8)
ALT: 8 IU/L (ref 0–44)
AST: 15 IU/L (ref 0–40)
Albumin/Globulin Ratio: 1.7 (ref 1.2–2.2)
Alkaline Phosphatase: 86 IU/L (ref 39–117)
BILIRUBIN TOTAL: 0.7 mg/dL (ref 0.0–1.2)
BUN / CREAT RATIO: 14 (ref 10–24)
BUN: 16 mg/dL (ref 8–27)
CALCIUM: 9.9 mg/dL (ref 8.6–10.2)
CHLORIDE: 97 mmol/L (ref 96–106)
CO2: 23 mmol/L (ref 20–29)
CREATININE: 1.15 mg/dL (ref 0.76–1.27)
GFR calc non Af Amer: 62 mL/min/{1.73_m2} (ref >59)
GFR, EST AFRICAN AMERICAN: 72 mL/min/{1.73_m2} (ref >59)
GLUCOSE: 217 mg/dL — AB (ref 65–99)
Globulin, Total: 2.6 g/dL (ref 1.5–4.5)
Potassium: 3.3 mmol/L — ABNORMAL LOW (ref 3.5–5.2)
Sodium: 139 mmol/L (ref 134–144)
TOTAL PROTEIN: 6.9 g/dL (ref 6.0–8.5)

## 2018-11-04 LAB — LIPID PANEL
Chol/HDL Ratio: 2.6 ratio (ref 0.0–5.0)
Cholesterol, Total: 127 mg/dL (ref 100–199)
HDL: 48 mg/dL (ref >39)
LDL Calculated: 58 mg/dL (ref 0–99)
Triglycerides: 104 mg/dL (ref 0–149)
VLDL Cholesterol Cal: 21 mg/dL (ref 5–40)

## 2018-11-04 MED ORDER — DAPAGLIFLOZIN PROPANEDIOL 5 MG PO TABS: 5.0000 mg | ORAL_TABLET | Freq: Every day | ORAL | 3 refills | Status: DC

## 2018-11-04 MED ORDER — POTASSIUM CHLORIDE ER 10 MEQ PO TBCR: 10.0000 meq | EXTENDED_RELEASE_TABLET | Freq: Every day | ORAL | 1 refills | Status: DC

## 2018-11-30 DIAGNOSIS — H40023 Open angle with borderline findings, high risk, bilateral: Secondary | ICD-10-CM | POA: Diagnosis not present

## 2018-11-30 DIAGNOSIS — Z9889 Other specified postprocedural states: Secondary | ICD-10-CM | POA: Diagnosis not present

## 2018-11-30 DIAGNOSIS — H11153 Pinguecula, bilateral: Secondary | ICD-10-CM | POA: Diagnosis not present

## 2018-11-30 DIAGNOSIS — H0102A Squamous blepharitis right eye, upper and lower eyelids: Secondary | ICD-10-CM | POA: Diagnosis not present

## 2018-11-30 DIAGNOSIS — H1131 Conjunctival hemorrhage, right eye: Secondary | ICD-10-CM | POA: Diagnosis not present

## 2018-11-30 DIAGNOSIS — E119 Type 2 diabetes mellitus without complications: Secondary | ICD-10-CM | POA: Diagnosis not present

## 2018-11-30 DIAGNOSIS — H0102B Squamous blepharitis left eye, upper and lower eyelids: Secondary | ICD-10-CM | POA: Diagnosis not present

## 2018-11-30 DIAGNOSIS — H2513 Age-related nuclear cataract, bilateral: Secondary | ICD-10-CM | POA: Diagnosis not present

## 2018-11-30 DIAGNOSIS — H11823 Conjunctivochalasis, bilateral: Secondary | ICD-10-CM | POA: Diagnosis not present

## 2018-11-30 DIAGNOSIS — H35033 Hypertensive retinopathy, bilateral: Secondary | ICD-10-CM | POA: Diagnosis not present

## 2018-11-30 LAB — HM DIABETES EYE EXAM

## 2019-01-27 DIAGNOSIS — E1142 Type 2 diabetes mellitus with diabetic polyneuropathy: Secondary | ICD-10-CM | POA: Diagnosis not present

## 2019-01-27 DIAGNOSIS — R69 Illness, unspecified: Secondary | ICD-10-CM | POA: Diagnosis not present

## 2019-01-27 DIAGNOSIS — I10 Essential (primary) hypertension: Secondary | ICD-10-CM | POA: Diagnosis not present

## 2019-01-27 DIAGNOSIS — Z7984 Long term (current) use of oral hypoglycemic drugs: Secondary | ICD-10-CM | POA: Diagnosis not present

## 2019-01-27 DIAGNOSIS — E785 Hyperlipidemia, unspecified: Secondary | ICD-10-CM | POA: Diagnosis not present

## 2019-01-27 DIAGNOSIS — G8929 Other chronic pain: Secondary | ICD-10-CM | POA: Diagnosis not present

## 2019-01-27 DIAGNOSIS — R32 Unspecified urinary incontinence: Secondary | ICD-10-CM | POA: Diagnosis not present

## 2019-01-27 DIAGNOSIS — Z803 Family history of malignant neoplasm of breast: Secondary | ICD-10-CM | POA: Diagnosis not present

## 2019-01-27 DIAGNOSIS — R269 Unspecified abnormalities of gait and mobility: Secondary | ICD-10-CM | POA: Diagnosis not present

## 2019-01-27 DIAGNOSIS — E1136 Type 2 diabetes mellitus with diabetic cataract: Secondary | ICD-10-CM | POA: Diagnosis not present

## 2019-02-03 ENCOUNTER — Other Ambulatory Visit: Payer: Self-pay | Admitting: Family

## 2019-02-03 DIAGNOSIS — I152 Hypertension secondary to endocrine disorders: Secondary | ICD-10-CM

## 2019-02-03 DIAGNOSIS — H11153 Pinguecula, bilateral: Secondary | ICD-10-CM | POA: Diagnosis not present

## 2019-02-03 DIAGNOSIS — H11823 Conjunctivochalasis, bilateral: Secondary | ICD-10-CM | POA: Diagnosis not present

## 2019-02-03 DIAGNOSIS — I10 Essential (primary) hypertension: Principal | ICD-10-CM

## 2019-02-03 DIAGNOSIS — Z9889 Other specified postprocedural states: Secondary | ICD-10-CM | POA: Diagnosis not present

## 2019-02-03 DIAGNOSIS — E1159 Type 2 diabetes mellitus with other circulatory complications: Secondary | ICD-10-CM

## 2019-02-03 DIAGNOSIS — H40023 Open angle with borderline findings, high risk, bilateral: Secondary | ICD-10-CM | POA: Diagnosis not present

## 2019-02-03 DIAGNOSIS — H2513 Age-related nuclear cataract, bilateral: Secondary | ICD-10-CM | POA: Diagnosis not present

## 2019-02-15 ENCOUNTER — Encounter: Payer: Self-pay | Admitting: Family

## 2019-02-15 ENCOUNTER — Ambulatory Visit (INDEPENDENT_AMBULATORY_CARE_PROVIDER_SITE_OTHER): Payer: Medicare HMO | Admitting: Family

## 2019-02-15 VITALS — BP 137/82 | HR 86 | Temp 97.4°F | Ht 67.0 in | Wt 159.0 lb

## 2019-02-15 DIAGNOSIS — G3109 Other frontotemporal dementia: Secondary | ICD-10-CM

## 2019-02-15 DIAGNOSIS — E1159 Type 2 diabetes mellitus with other circulatory complications: Secondary | ICD-10-CM | POA: Diagnosis not present

## 2019-02-15 DIAGNOSIS — E119 Type 2 diabetes mellitus without complications: Secondary | ICD-10-CM | POA: Diagnosis not present

## 2019-02-15 DIAGNOSIS — I10 Essential (primary) hypertension: Secondary | ICD-10-CM

## 2019-02-15 DIAGNOSIS — I152 Hypertension secondary to endocrine disorders: Secondary | ICD-10-CM

## 2019-02-15 DIAGNOSIS — IMO0001 Reserved for inherently not codable concepts without codable children: Secondary | ICD-10-CM

## 2019-02-15 DIAGNOSIS — R69 Illness, unspecified: Secondary | ICD-10-CM | POA: Diagnosis not present

## 2019-02-15 DIAGNOSIS — F0281 Dementia in other diseases classified elsewhere with behavioral disturbance: Secondary | ICD-10-CM

## 2019-02-15 LAB — BAYER DCA HB A1C WAIVED: HB A1C (BAYER DCA - WAIVED): 8.2 % — ABNORMAL HIGH (ref <7.0)

## 2019-02-15 NOTE — Progress Notes (Signed)
Subjective:    Patient ID: Robert Osborne, male    DOB: 1946/09/27, 73 y.o.   MRN: [id number]  Chief Complaint  Patient presents with  . forms from department of transportation   Pt presents to the office today to have DMV paperwork completed. He was diagnosed with major frontotemporal neurocognitive disorder with behavioral disturbance in 11/2017. He had a negative CT head and his symptoms of confusion  have since resolved. He reports he is doing well and continues to work as a Development worker, international aid.  Hypertension  This is a chronic problem. The current episode started more than 1 year ago. The problem has been resolved since onset. The problem is controlled. Associated symptoms include blurred vision and malaise/fatigue. Pertinent negatives include no peripheral edema or shortness of breath. Risk factors for coronary artery disease include dyslipidemia, diabetes mellitus and male gender. The current treatment provides moderate improvement. There is no history of kidney disease, CVA or heart failure.  Diabetes  He presents for his follow-up diabetic visit. He has type 2 diabetes mellitus. His disease course has been stable. Associated symptoms include blurred vision. Pertinent negatives for diabetes include no foot paresthesias and no visual change. Symptoms are stable. Pertinent negatives for diabetic complications include no CVA. Risk factors for coronary artery disease include diabetes mellitus, dyslipidemia, male sex, hypertension and sedentary lifestyle. He is following a generally healthy diet. (Does not check BS at home )     Review of Systems  Constitutional: Positive for malaise/fatigue.  Eyes: Positive for blurred vision.  Respiratory: Negative for shortness of breath.   All other systems reviewed and are negative.      Objective:   Physical Exam Vitals signs reviewed.  Constitutional:      General: He is not in acute distress.    Appearance: He is well-developed.  HENT:     Head:  Normocephalic.     Right Ear: Tympanic membrane normal.     Left Ear: Tympanic membrane normal.  Eyes:     General:        Right eye: No discharge.        Left eye: No discharge.     Pupils: Pupils are equal, round, and reactive to light.  Neck:     Musculoskeletal: Normal range of motion and neck supple.     Thyroid: No thyromegaly.  Cardiovascular:     Rate and Rhythm: Normal rate and regular rhythm.     Heart sounds: Normal heart sounds. No murmur.  Pulmonary:     Effort: Pulmonary effort is normal. No respiratory distress.     Breath sounds: Normal breath sounds. No wheezing.  Abdominal:     General: Bowel sounds are normal. There is no distension.     Palpations: Abdomen is soft.     Tenderness: There is no abdominal tenderness.  Musculoskeletal: Normal range of motion.        General: No tenderness.  Skin:    General: Skin is warm and dry.     Findings: No erythema or rash.  Neurological:     Mental Status: He is alert and oriented to person, place, and time.     Cranial Nerves: No cranial nerve deficit.     Deep Tendon Reflexes: Reflexes are normal and symmetric.  Psychiatric:        Behavior: Behavior normal.        Thought Content: Thought content normal.        Judgment: Judgment normal.  BP (!) 143/87   Pulse 83   Temp (!) 97.4 F (36.3 C) (Oral)   Ht _0  (1.702 m)   Wt 159 lb (72.1 kg)   BMI 24.90 kg/m      Assessment & Plan:  Robert Osborne comes in today with chief complaint of forms from department of transportation   Diagnosis and orders addressed:  1. Hypertension associated with diabetes ([place]) - CMP14+EGFR - CBC with Differential/Platelet  2. Diabetes mellitus without complication ([place]) - Bayer DCA Hb A1c Waived - CMP14+EGFR - CBC with Differential/Platelet - Microalbumin / creatinine urine ratio  3. Major frontotemporal neurocognitive disorder, probable, with behavioral disturbance ([place])  DMV paperwork completed Labs  pending Health Maintenance reviewed Diet and exercise encouraged  Follow up plan: 3 months  Evelina Dun, FNP

## 2019-02-15 NOTE — Patient Instructions (Signed)
Diabetes Mellitus and Nutrition, Adult  When you have diabetes (diabetes mellitus), it is very important to have healthy eating habits because your blood sugar (glucose) levels are greatly affected by what you eat and drink. Eating healthy foods in the appropriate amounts, at about the same times every day, can help you:  · Control your blood glucose.  · Lower your risk of heart disease.  · Improve your blood pressure.  · Reach or maintain a healthy weight.  Every person with diabetes is different, and each person has different needs for a meal plan. Your health care provider may recommend that you work with a diet and nutrition specialist (dietitian) to make a meal plan that is best for you. Your meal plan may vary depending on factors such as:  · The calories you need.  · The medicines you take.  · Your weight.  · Your blood glucose, blood pressure, and cholesterol levels.  · Your activity level.  · Other health conditions you have, such as heart or kidney disease.  How do carbohydrates affect me?  Carbohydrates, also called carbs, affect your blood glucose level more than any other type of food. Eating carbs naturally raises the amount of glucose in your blood. Carb counting is a method for keeping track of how many carbs you eat. Counting carbs is important to keep your blood glucose at a healthy level, especially if you use insulin or take certain oral diabetes medicines.  It is important to know how many carbs you can safely have in each meal. This is different for every person. Your dietitian can help you calculate how many carbs you should have at each meal and for each snack.  Foods that contain carbs include:  · Bread, cereal, rice, pasta, and crackers.  · Potatoes and corn.  · Peas, beans, and lentils.  · Milk and yogurt.  · Fruit and juice.  · Desserts, such as cakes, cookies, ice cream, and candy.  How does alcohol affect me?  Alcohol can cause a sudden decrease in blood glucose (hypoglycemia),  especially if you use insulin or take certain oral diabetes medicines. Hypoglycemia can be a life-threatening condition. Symptoms of hypoglycemia (sleepiness, dizziness, and confusion) are similar to symptoms of having too much alcohol.  If your health care provider says that alcohol is safe for you, follow these guidelines:  · Limit alcohol intake to no more than 1 drink per day for nonpregnant women and 2 drinks per day for men. One drink equals 12 oz of beer, 5 oz of wine, or 1½ oz of hard liquor.  · Do not drink on an empty stomach.  · Keep yourself hydrated with water, diet soda, or unsweetened iced tea.  · Keep in mind that regular soda, juice, and other mixers may contain a lot of sugar and must be counted as carbs.  What are tips for following this plan?    Reading food labels  · Start by checking the serving size on the "Nutrition Facts" label of packaged foods and drinks. The amount of calories, carbs, fats, and other nutrients listed on the label is based on one serving of the item. Many items contain more than one serving per package.  · Check the total grams (g) of carbs in one serving. You can calculate the number of servings of carbs in one serving by dividing the total carbs by 15. For example, if a food has 30 g of total carbs, it would be equal to 2   servings of carbs.  · Check the number of grams (g) of saturated and trans fats in one serving. Choose foods that have low or no amount of these fats.  · Check the number of milligrams (mg) of salt (sodium) in one serving. Most people should limit total sodium intake to less than 2,300 mg per day.  · Always check the nutrition information of foods labeled as "low-fat" or "nonfat". These foods may be higher in added sugar or refined carbs and should be avoided.  · Talk to your dietitian to identify your daily goals for nutrients listed on the label.  Shopping  · Avoid buying canned, premade, or processed foods. These foods tend to be high in fat, sodium,  and added sugar.  · Shop around the outside edge of the grocery store. This includes fresh fruits and vegetables, bulk grains, fresh meats, and fresh dairy.  Cooking  · Use low-heat cooking methods, such as baking, instead of high-heat cooking methods like deep frying.  · Cook using healthy oils, such as olive, canola, or sunflower oil.  · Avoid cooking with butter, cream, or high-fat meats.  Meal planning  · Eat meals and snacks regularly, preferably at the same times every day. Avoid going long periods of time without eating.  · Eat foods high in fiber, such as fresh fruits, vegetables, beans, and whole grains. Talk to your dietitian about how many servings of carbs you can eat at each meal.  · Eat 4-6 ounces (oz) of lean protein each day, such as lean meat, chicken, fish, eggs, or tofu. One oz of lean protein is equal to:  ? 1 oz of meat, chicken, or fish.  ? 1 egg.  ? ¼ cup of tofu.  · Eat some foods each day that contain healthy fats, such as avocado, nuts, seeds, and fish.  Lifestyle  · Check your blood glucose regularly.  · Exercise regularly as told by your health care provider. This may include:  ? 150 minutes of moderate-intensity or vigorous-intensity exercise each week. This could be brisk walking, biking, or water aerobics.  ? Stretching and doing strength exercises, such as yoga or weightlifting, at least 2 times a week.  · Take medicines as told by your health care provider.  · Do not use any products that contain nicotine or tobacco, such as cigarettes and e-cigarettes. If you need help quitting, ask your health care provider.  · Work with a counselor or diabetes educator to identify strategies to manage stress and any emotional and social challenges.  Questions to ask a health care provider  · Do I need to meet with a diabetes educator?  · Do I need to meet with a dietitian?  · What number can I call if I have questions?  · When are the best times to check my blood glucose?  Where to find more  information:  · American Diabetes Association: diabetes.org  · Academy of Nutrition and Dietetics: www.eatright.org  · National Institute of Diabetes and Digestive and Kidney Diseases (NIH): www.niddk.nih.gov  Summary  · A healthy meal plan will help you control your blood glucose and maintain a healthy lifestyle.  · Working with a diet and nutrition specialist (dietitian) can help you make a meal plan that is best for you.  · Keep in mind that carbohydrates (carbs) and alcohol have immediate effects on your blood glucose levels. It is important to count carbs and to use alcohol carefully.  This information is not intended to   replace advice given to you by your health care provider. Make sure you discuss any questions you have with your health care provider.  Document Released: 08/28/2005 Document Revised: 07/01/2017 Document Reviewed: 01/05/2017  Elsevier Interactive Patient Education © 2019 Else[employer] Inc[employer]

## 2019-02-16 LAB — CMP14+EGFR
ALBUMIN: 4.4 g/dL (ref 3.7–4.7)
ALT: 25 IU/L (ref 0–44)
AST: 23 IU/L (ref 0–40)
Albumin/Globulin Ratio: 1.7 (ref 1.2–2.2)
Alkaline Phosphatase: 90 IU/L (ref 39–117)
BUN/Creatinine Ratio: 15 (ref 10–24)
BUN: 15 mg/dL (ref 8–27)
Bilirubin Total: 0.8 mg/dL (ref 0.0–1.2)
CO2: 20 mmol/L (ref 20–29)
Calcium: 9.8 mg/dL (ref 8.6–10.2)
Chloride: 100 mmol/L (ref 96–106)
Creatinine, Ser: 0.97 mg/dL (ref 0.76–1.27)
GFR calc Af Amer: 88 mL/min/{1.73_m2} (ref >59)
GFR calc non Af Amer: 76 mL/min/{1.73_m2} (ref >59)
Globulin, Total: 2.6 g/dL (ref 1.5–4.5)
Glucose: 178 mg/dL — ABNORMAL HIGH (ref 65–99)
Potassium: 3.8 mmol/L (ref 3.5–5.2)
Sodium: 141 mmol/L (ref 134–144)
Total Protein: 7 g/dL (ref 6.0–8.5)

## 2019-02-16 LAB — CBC WITH DIFFERENTIAL/PLATELET
BASOS ABS: 0 10*3/uL (ref 0.0–0.2)
Basos: 0 %
EOS (ABSOLUTE): 0 10*3/uL (ref 0.0–0.4)
Eos: 0 %
HEMOGLOBIN: 15 g/dL (ref 13.0–17.7)
Hematocrit: 43.3 % (ref 37.5–51.0)
Immature Grans (Abs): 0 10*3/uL (ref 0.0–0.1)
Immature Granulocytes: 0 %
Lymphocytes Absolute: 1 10*3/uL (ref 0.7–3.1)
Lymphs: 18 %
MCH: 30.1 pg (ref 26.6–33.0)
MCHC: 34.6 g/dL (ref 31.5–35.7)
MCV: 87 fL (ref 79–97)
Monocytes Absolute: 0.5 10*3/uL (ref 0.1–0.9)
Monocytes: 10 %
Neutrophils Absolute: 3.7 10*3/uL (ref 1.4–7.0)
Neutrophils: 72 %
Platelets: 234 10*3/uL (ref 150–450)
RBC: 4.99 x10E6/uL (ref 4.14–5.80)
RDW: 12.7 % (ref 11.6–15.4)
WBC: 5.2 10*3/uL (ref 3.4–10.8)

## 2019-02-17 ENCOUNTER — Other Ambulatory Visit: Payer: Self-pay | Admitting: Family

## 2019-02-17 ENCOUNTER — Other Ambulatory Visit: Payer: Self-pay | Admitting: *Deleted

## 2019-02-17 MED ORDER — DAPAGLIFLOZIN PROPANEDIOL 10 MG PO TABS: 10.0000 mg | ORAL_TABLET | Freq: Every day | ORAL | 1 refills | Status: DC

## 2019-05-13 ENCOUNTER — Other Ambulatory Visit: Payer: Self-pay | Admitting: Family

## 2019-05-13 DIAGNOSIS — I152 Hypertension secondary to endocrine disorders: Secondary | ICD-10-CM

## 2019-05-13 DIAGNOSIS — E1159 Type 2 diabetes mellitus with other circulatory complications: Secondary | ICD-10-CM

## 2019-05-13 DIAGNOSIS — F0391 Unspecified dementia with behavioral disturbance: Secondary | ICD-10-CM

## 2019-06-06 DIAGNOSIS — R69 Illness, unspecified: Secondary | ICD-10-CM | POA: Diagnosis not present

## 2019-06-10 ENCOUNTER — Other Ambulatory Visit: Payer: Self-pay | Admitting: Family

## 2019-06-10 ENCOUNTER — Other Ambulatory Visit: Payer: Self-pay | Admitting: *Deleted

## 2019-06-15 ENCOUNTER — Other Ambulatory Visit: Payer: Self-pay | Admitting: Family

## 2019-06-15 DIAGNOSIS — E1169 Type 2 diabetes mellitus with other specified complication: Secondary | ICD-10-CM

## 2019-06-15 DIAGNOSIS — F0391 Unspecified dementia with behavioral disturbance: Secondary | ICD-10-CM

## 2019-06-15 NOTE — Telephone Encounter (Signed)
Please advise 

## 2019-06-16 ENCOUNTER — Telehealth: Payer: Self-pay | Admitting: Family

## 2019-06-16 ENCOUNTER — Other Ambulatory Visit: Payer: Self-pay | Admitting: *Deleted

## 2019-06-16 DIAGNOSIS — E1159 Type 2 diabetes mellitus with other circulatory complications: Secondary | ICD-10-CM

## 2019-06-16 DIAGNOSIS — I152 Hypertension secondary to endocrine disorders: Secondary | ICD-10-CM

## 2019-06-16 MED ORDER — AMLODIPINE BESYLATE 10 MG PO TABS: 10.0000 mg | ORAL_TABLET | Freq: Every day | ORAL | 1 refills | Status: DC

## 2019-06-16 NOTE — Telephone Encounter (Signed)
Attempted to contact patient - NA °

## 2019-06-17 NOTE — Telephone Encounter (Signed)
Patient has a follow up appointment scheduled. 

## 2019-07-14 DIAGNOSIS — R69 Illness, unspecified: Secondary | ICD-10-CM | POA: Diagnosis not present

## 2019-07-22 ENCOUNTER — Other Ambulatory Visit: Payer: Self-pay | Admitting: Family

## 2019-07-22 ENCOUNTER — Ambulatory Visit: Payer: Medicare HMO | Admitting: Family

## 2019-07-22 DIAGNOSIS — F0391 Unspecified dementia with behavioral disturbance: Secondary | ICD-10-CM

## 2019-07-26 ENCOUNTER — Ambulatory Visit: Payer: Medicare HMO | Admitting: Family

## 2019-07-28 ENCOUNTER — Other Ambulatory Visit: Payer: Self-pay

## 2019-07-29 ENCOUNTER — Encounter: Payer: Self-pay | Admitting: Family

## 2019-07-29 ENCOUNTER — Telehealth: Payer: Self-pay | Admitting: Cardiology

## 2019-07-29 ENCOUNTER — Ambulatory Visit (INDEPENDENT_AMBULATORY_CARE_PROVIDER_SITE_OTHER): Payer: Medicare HMO | Admitting: Family

## 2019-07-29 VITALS — BP 145/89 | HR 68 | Temp 98.2°F | Ht 67.0 in | Wt 152.2 lb

## 2019-07-29 DIAGNOSIS — E1169 Type 2 diabetes mellitus with other specified complication: Secondary | ICD-10-CM

## 2019-07-29 DIAGNOSIS — Z8551 Personal history of malignant neoplasm of bladder: Secondary | ICD-10-CM | POA: Diagnosis not present

## 2019-07-29 DIAGNOSIS — E785 Hyperlipidemia, unspecified: Secondary | ICD-10-CM | POA: Diagnosis not present

## 2019-07-29 DIAGNOSIS — R531 Weakness: Secondary | ICD-10-CM

## 2019-07-29 DIAGNOSIS — R6889 Other general symptoms and signs: Secondary | ICD-10-CM | POA: Diagnosis not present

## 2019-07-29 DIAGNOSIS — E119 Type 2 diabetes mellitus without complications: Secondary | ICD-10-CM

## 2019-07-29 DIAGNOSIS — C679 Malignant neoplasm of bladder, unspecified: Secondary | ICD-10-CM

## 2019-07-29 DIAGNOSIS — E1159 Type 2 diabetes mellitus with other circulatory complications: Secondary | ICD-10-CM

## 2019-07-29 DIAGNOSIS — I10 Essential (primary) hypertension: Secondary | ICD-10-CM

## 2019-07-29 DIAGNOSIS — I452 Bifascicular block: Secondary | ICD-10-CM

## 2019-07-29 DIAGNOSIS — I152 Hypertension secondary to endocrine disorders: Secondary | ICD-10-CM

## 2019-07-29 LAB — BAYER DCA HB A1C WAIVED: HB A1C (BAYER DCA - WAIVED): >14 % — ABNORMAL HIGH (ref <7.0)

## 2019-07-29 NOTE — Telephone Encounter (Signed)
LVM for patient to call and schedule followup with Dr. Percival Spanish.

## 2019-07-29 NOTE — Patient Instructions (Signed)
Weakness °Weakness is a lack of strength. You may feel weak all over your body (generalized), or you may feel weak in one specific part of your body (focal). Common causes of weakness include: °· Infection and immune system disorders. °· Physical exhaustion. °· Internal bleeding or other blood loss that results in a lack of red blood cells (anemia). °· Dehydration. °· An imbalance in mineral (electrolyte) levels, such as potassium. °· Heart disease, circulation problems, or stroke. °Other causes include: °· Some medicines or cancer treatment. °· Stress, anxiety, or depression. °· Nervous system disorders. °· Thyroid disorders. °· Loss of muscle strength because of age or inactivity. °· Poor sleep quality or sleep disorders. °The cause of your weakness may not be known. Some causes of weakness can be serious, so it is important to see your health care provider. °Follow these instructions at home: °Activity °· Rest as needed. °· Try to get enough sleep. Most adults need 7-8 hours of quality sleep each night. Talk to your health care provider about how much sleep you need each night. °· Do exercises, such as arm curls and leg raises, for 30 minutes at least 2 days a week or as told by your health care provider. This helps build muscle strength. °· Consider working with a physical therapist or trainer who can develop an exercise plan to help you gain muscle strength. °General instructions ° °· Take over-the-counter and prescription medicines only as told by your health care provider. °· Eat a healthy, well-balanced diet. This includes: °? Proteins to build muscles, such as lean meats and fish. °? Fresh fruits and vegetables. °? Carbohydrates to boost energy, such as whole grains. °· Drink enough fluid to keep your urine pale yellow. °· Keep all follow-up visits as told by your health care provider. This is important. °Contact a health care provider if your weakness: °· Does not improve or gets worse. °· Affects your  ability to think clearly. °· Affects your ability to do your normal daily activities. °Get help right away if you: °· Develop sudden weakness, especially on one side of your face or body. °· Have chest pain. °· Have trouble breathing or shortness of breath. °· Have problems with your vision. °· Have trouble talking or swallowing. °· Have trouble standing or walking. °· Are light-headed or lose consciousness. °Summary °· Weakness is a lack of strength. You may feel weak all over your body or just in one specific part of your body. °· Weakness can be caused by a variety of things. In some cases, the cause may be unknown. °· Rest as needed, and try to get enough sleep. Most adults need 7-8 hours of quality sleep each night. °· Eat a healthy, well-balanced diet. °This information is not intended to replace advice given to you by your health care provider. Make sure you discuss any questions you have with your health care provider. °Document Released: 12/01/2005 Document Revised: 07/07/2018 Document Reviewed: 07/07/2018 °Elsevier Patient Education © 2020 Elsevier Inc. ° °

## 2019-07-29 NOTE — Progress Notes (Signed)
Subjective:    Patient ID: Robert Osborne, male    DOB: 20-Aug-1946, 73 y.o.   MRN: [id number]  Chief Complaint  Patient presents with  . Diabetes  . Medical Management of Chronic Issues    refills      Pt presents to the office today for chronic follow up.  Diabetes He presents for his follow-up diabetic visit. He has type 2 diabetes mellitus. His disease course has been stable. There are no hypoglycemic associated symptoms. Associated symptoms include blurred vision and visual change. Pertinent negatives for diabetes include no foot paresthesias. Symptoms are stable. Diabetic complications include heart disease. Risk factors for coronary artery disease include dyslipidemia, diabetes mellitus, male sex, hypertension, sedentary lifestyle and post-menopausal. He is following a generally healthy diet. (Does not check BS at home ) Eye exam is current.  Hypertension This is a chronic problem. The current episode started more than 1 year ago. The problem has been waxing and waning since onset. The problem is uncontrolled. Associated symptoms include blurred vision and malaise/fatigue. Pertinent negatives include no peripheral edema or shortness of breath. The current treatment provides moderate improvement. Hypertensive end-organ damage includes CAD/MI.  Hyperlipidemia This is a chronic problem. The current episode started more than 1 year ago. The problem is controlled. Recent lipid tests were reviewed and are normal. Exacerbating diseases include obesity. Pertinent negatives include no shortness of breath. Current antihyperlipidemic treatment includes statins. The current treatment provides moderate improvement of lipids. Risk factors for coronary artery disease include dyslipidemia, diabetes mellitus, male sex, hypertension and a sedentary lifestyle.      Review of Systems  Constitutional: Positive for malaise/fatigue.  Eyes: Positive for blurred vision.  Respiratory: Negative for  shortness of breath.   All other systems reviewed and are negative.      Objective:   Physical Exam Vitals signs reviewed.  Constitutional:      General: He is not in acute distress.    Appearance: He is well-developed.  HENT:     Head: Normocephalic.     Right Ear: Tympanic membrane normal.     Left Ear: Tympanic membrane normal.  Eyes:     General:        Right eye: No discharge.        Left eye: No discharge.     Pupils: Pupils are equal, round, and reactive to light.  Neck:     Musculoskeletal: Normal range of motion and neck supple.     Thyroid: No thyromegaly.  Cardiovascular:     Rate and Rhythm: Normal rate and regular rhythm.     Heart sounds: Normal heart sounds. No murmur.  Pulmonary:     Effort: Pulmonary effort is normal. No respiratory distress.     Breath sounds: Normal breath sounds. No wheezing.  Abdominal:     General: Bowel sounds are normal. There is no distension.     Palpations: Abdomen is soft.     Tenderness: There is no abdominal tenderness.  Musculoskeletal: Normal range of motion.        General: No tenderness.  Skin:    General: Skin is warm and dry.     Findings: No erythema or rash.  Neurological:     Mental Status: He is alert and oriented to person, place, and time.     Cranial Nerves: No cranial nerve deficit.     Deep Tendon Reflexes: Reflexes are normal and symmetric.  Psychiatric:        Behavior: Behavior normal.  Thought Content: Thought content normal.        Judgment: Judgment normal.       BP (!) 155/85   Pulse 70   Temp 98.2 F (36.8 C) (Oral)   Ht _0  (1.702 m)   Wt 152 lb 3.2 oz (69 kg)   BMI 23.84 kg/m      Assessment & Plan:  Robert Osborne comes in today with chief complaint of Diabetes and Medical Management of Chronic Issues (refills)   Diagnosis and orders addressed:  1. Hypertension associated with diabetes ([place]) - CMP14+EGFR  2. Hyperlipidemia associated with type 2 diabetes mellitus  ([facility]) - CMP14+EGFR - Lipid panel  3. Diabetes mellitus without complication ([place]) - Bayer DCA Hb A1c Waived - CMP14+EGFR - Microalbumin / creatinine urine ratio  4. Hx of bladder cancer - CMP14+EGFR - Ambulatory referral to Urology  5. Malignant neoplasm of urinary bladder, unspecified site ([facility]) - CMP14+EGFR  6. Weakness - CMP14+EGFR - Anemia Profile B - TSH - Ambulatory referral to Urology - EKG 12-Lead - Ambulatory referral to Cardiology  7. Right BBB/left ant fasc block - Ambulatory referral to Cardiology   Labs pending Health Maintenance reviewed Diet and exercise encouraged  Follow up plan: 1 months    Evelina Dun, FNP

## 2019-07-29 NOTE — Addendum Note (Signed)
Addended by: Shelbie Ammons on: 07/29/2019 03:53 PM   Modules accepted: Orders

## 2019-07-30 LAB — ANEMIA PROFILE B
Basophils Absolute: 0 10*3/uL (ref 0.0–0.2)
Basos: 0 %
EOS (ABSOLUTE): 0 10*3/uL (ref 0.0–0.4)
Eos: 0 %
Ferritin: 74 ng/mL (ref 30–400)
Folate: 16 ng/mL (ref >3.0)
Hematocrit: 44.5 % (ref 37.5–51.0)
Hemoglobin: 15 g/dL (ref 13.0–17.7)
Immature Grans (Abs): 0 10*3/uL (ref 0.0–0.1)
Immature Granulocytes: 0 %
Iron Saturation: 26 % (ref 15–55)
Iron: 75 ug/dL (ref 38–169)
Lymphocytes Absolute: 1.1 10*3/uL (ref 0.7–3.1)
Lymphs: 23 %
MCH: 29.2 pg (ref 26.6–33.0)
MCHC: 33.7 g/dL (ref 31.5–35.7)
MCV: 87 fL (ref 79–97)
Monocytes Absolute: 0.5 10*3/uL (ref 0.1–0.9)
Monocytes: 10 %
Neutrophils Absolute: 3.3 10*3/uL (ref 1.4–7.0)
Neutrophils: 67 %
Platelets: 233 10*3/uL (ref 150–450)
RBC: 5.14 x10E6/uL (ref 4.14–5.80)
RDW: 12.2 % (ref 11.6–15.4)
Retic Ct Pct: 2 % (ref 0.6–2.6)
Total Iron Binding Capacity: 290 ug/dL (ref 250–450)
UIBC: 215 ug/dL (ref 111–343)
Vitamin B-12: 1560 pg/mL — ABNORMAL HIGH (ref 232–1245)
WBC: 4.9 10*3/uL (ref 3.4–10.8)

## 2019-07-30 LAB — CMP14+EGFR
ALT: 14 IU/L (ref 0–44)
AST: 19 IU/L (ref 0–40)
Albumin/Globulin Ratio: 1.7 (ref 1.2–2.2)
Albumin: 4.5 g/dL (ref 3.7–4.7)
Alkaline Phosphatase: 104 IU/L (ref 39–117)
BUN/Creatinine Ratio: 13 (ref 10–24)
BUN: 14 mg/dL (ref 8–27)
Bilirubin Total: 0.8 mg/dL (ref 0.0–1.2)
CO2: 26 mmol/L (ref 20–29)
Calcium: 9.8 mg/dL (ref 8.6–10.2)
Chloride: 97 mmol/L (ref 96–106)
Creatinine, Ser: 1.12 mg/dL (ref 0.76–1.27)
GFR calc Af Amer: 74 mL/min/{1.73_m2} (ref >59)
GFR calc non Af Amer: 64 mL/min/{1.73_m2} (ref >59)
Globulin, Total: 2.6 g/dL (ref 1.5–4.5)
Glucose: 294 mg/dL — ABNORMAL HIGH (ref 65–99)
Potassium: 3.9 mmol/L (ref 3.5–5.2)
Sodium: 138 mmol/L (ref 134–144)
Total Protein: 7.1 g/dL (ref 6.0–8.5)

## 2019-07-30 LAB — TSH: TSH: 0.564 u[IU]/mL (ref 0.450–4.500)

## 2019-07-30 LAB — LIPID PANEL
Chol/HDL Ratio: 2.8 ratio (ref 0.0–5.0)
Cholesterol, Total: 148 mg/dL (ref 100–199)
HDL: 52 mg/dL (ref >39)
LDL Calculated: 74 mg/dL (ref 0–99)
Triglycerides: 111 mg/dL (ref 0–149)
VLDL Cholesterol Cal: 22 mg/dL (ref 5–40)

## 2019-08-02 ENCOUNTER — Other Ambulatory Visit: Payer: Self-pay | Admitting: Family

## 2019-08-02 DIAGNOSIS — E1165 Type 2 diabetes mellitus with hyperglycemia: Secondary | ICD-10-CM

## 2019-08-02 MED ORDER — METFORMIN HCL ER 750 MG PO TB24: 1500.0000 mg | ORAL_TABLET | Freq: Every day | ORAL | 1 refills | Status: DC

## 2019-08-08 ENCOUNTER — Other Ambulatory Visit: Payer: Self-pay

## 2019-08-09 ENCOUNTER — Ambulatory Visit (INDEPENDENT_AMBULATORY_CARE_PROVIDER_SITE_OTHER): Payer: Medicare HMO | Admitting: Family

## 2019-08-09 ENCOUNTER — Encounter: Payer: Self-pay | Admitting: Family

## 2019-08-09 VITALS — BP 144/89 | HR 72 | Temp 98.6°F | Ht 67.0 in | Wt 150.4 lb

## 2019-08-09 DIAGNOSIS — I10 Essential (primary) hypertension: Secondary | ICD-10-CM | POA: Diagnosis not present

## 2019-08-09 DIAGNOSIS — E1159 Type 2 diabetes mellitus with other circulatory complications: Secondary | ICD-10-CM | POA: Diagnosis not present

## 2019-08-09 DIAGNOSIS — E1165 Type 2 diabetes mellitus with hyperglycemia: Secondary | ICD-10-CM | POA: Diagnosis not present

## 2019-08-09 DIAGNOSIS — R350 Frequency of micturition: Secondary | ICD-10-CM | POA: Diagnosis not present

## 2019-08-09 DIAGNOSIS — I152 Hypertension secondary to endocrine disorders: Secondary | ICD-10-CM

## 2019-08-09 LAB — GLUCOSE HEMOCUE WAIVED: Glu Hemocue Waived: 248 mg/dL — ABNORMAL HIGH (ref 65–99)

## 2019-08-09 MED ORDER — BLOOD GLUCOSE METER KIT: PACK | 0 refills | Status: DC

## 2019-08-09 NOTE — Progress Notes (Signed)
Subjective:    Patient ID: Robert Osborne, male    DOB: 1946/01/10, 73 y.o.   MRN: [id number]  Chief Complaint  Patient presents with  . Hypertension    four week recheck  . Diabetes   Pt presents to the office today to recheck DM and hypertension. He was seen on 07/29/19 with weakness, dizziness, excessive thirst. He has stopped his metformin because of diarrhea. We restarted this.  Hypertension This is a chronic problem. The current episode started more than 1 year ago. The problem has been waxing and waning since onset. The problem is uncontrolled. Associated symptoms include blurred vision and malaise/fatigue. Pertinent negatives include no peripheral edema or shortness of breath. Risk factors for coronary artery disease include dyslipidemia, obesity, male gender and sedentary lifestyle. The current treatment provides moderate improvement. Hypertensive end-organ damage includes kidney disease and CAD/MI.  Diabetes He presents for his follow-up diabetic visit. He has type 2 diabetes mellitus. Pertinent negatives for hypoglycemia include no confusion or hunger. Associated symptoms include blurred vision. Pertinent negatives for diabetes include no foot paresthesias. Symptoms are worsening. Risk factors for coronary artery disease include dyslipidemia, diabetes mellitus, male sex and hypertension. He is following a generally healthy diet. (Not checking BS at home )      Review of Systems  Constitutional: Positive for malaise/fatigue.  Eyes: Positive for blurred vision.  Respiratory: Negative for shortness of breath.   Psychiatric/Behavioral: Negative for confusion.  All other systems reviewed and are negative.      Objective:   Physical Exam Vitals signs reviewed.  Constitutional:      General: He is not in acute distress.    Appearance: He is well-developed.  HENT:     Head: Normocephalic.  Eyes:     General:        Right eye: No discharge.        Left eye: No discharge.      Pupils: Pupils are equal, round, and reactive to light.  Neck:     Musculoskeletal: Normal range of motion and neck supple.     Thyroid: No thyromegaly.  Cardiovascular:     Rate and Rhythm: Normal rate and regular rhythm.     Heart sounds: Normal heart sounds. No murmur.  Pulmonary:     Effort: Pulmonary effort is normal. No respiratory distress.     Breath sounds: Normal breath sounds. No wheezing.  Abdominal:     General: Bowel sounds are normal. There is no distension.     Palpations: Abdomen is soft.     Tenderness: There is no abdominal tenderness.  Musculoskeletal: Normal range of motion.        General: No tenderness.  Skin:    General: Skin is warm and dry.     Findings: No erythema or rash.  Neurological:     Mental Status: He is alert and oriented to person, place, and time.     Cranial Nerves: No cranial nerve deficit.     Deep Tendon Reflexes: Reflexes are normal and symmetric.  Psychiatric:        Behavior: Behavior normal.        Thought Content: Thought content normal.        Judgment: Judgment normal.       BP (!) 153/90   Pulse 74   Temp 98.6 F (37 C) (Temporal)   Ht '5\' 7"'$  (1.702 m)   Wt 150 lb 6.4 oz (68.2 kg)   BMI 23.56 kg/m  Assessment & Plan:  Robert Osborne comes in today with chief complaint of Hypertension (four week recheck) and Diabetes   Diagnosis and orders addressed:  1. Urine frequency - Urinalysis, Complete - BMP8+EGFR  2. Hypertension associated with diabetes ([place]) - BMP8+EGFR  3. Type 2 diabetes mellitus with hyperglycemia, without long-term current use of insulin ([facility]) Strict low carb diet Continue Metformin Start checking BS daily RTO in 1 month - Microalbumin / creatinine urine ratio - Glucose Hemocue Waived - BMP8+EGFR - blood glucose meter kit and supplies; Dispense based on patient and insurance preference. Use up to four times daily as directed. (FOR ICD-10 E10.9, E11.9).  Dispense: 1 each; Refill: 0    Evelina Dun, FNP

## 2019-08-09 NOTE — Patient Instructions (Signed)

## 2019-08-10 ENCOUNTER — Other Ambulatory Visit: Payer: Self-pay | Admitting: Family

## 2019-08-10 LAB — BMP8+EGFR
BUN/Creatinine Ratio: 15 (ref 10–24)
BUN: 15 mg/dL (ref 8–27)
CO2: 25 mmol/L (ref 20–29)
Calcium: 9.9 mg/dL (ref 8.6–10.2)
Chloride: 96 mmol/L (ref 96–106)
Creatinine, Ser: 1.02 mg/dL (ref 0.76–1.27)
GFR calc Af Amer: 83 mL/min/{1.73_m2} (ref >59)
GFR calc non Af Amer: 72 mL/min/{1.73_m2} (ref >59)
Glucose: 239 mg/dL — ABNORMAL HIGH (ref 65–99)
Potassium: 3.3 mmol/L — ABNORMAL LOW (ref 3.5–5.2)
Sodium: 141 mmol/L (ref 134–144)

## 2019-08-10 MED ORDER — POTASSIUM CHLORIDE CRYS ER 20 MEQ PO TBCR: 20.0000 meq | EXTENDED_RELEASE_TABLET | Freq: Every day | ORAL | 1 refills | Status: DC

## 2019-08-10 MED ORDER — TRESIBA FLEXTOUCH 100 UNIT/ML ~~LOC~~ SOPN: 5.0000 [IU] | PEN_INJECTOR | Freq: Every day | SUBCUTANEOUS | 2 refills | Status: DC

## 2019-08-17 ENCOUNTER — Observation Stay (HOSPITAL_COMMUNITY)
Admission: EM | Admit: 2019-08-17 | Discharge: 2019-08-19 | Disposition: A | Discharge status: Home / Self Care | Payer: Medicare HMO | Attending: Family Medicine | Admitting: Family Medicine

## 2019-08-17 ENCOUNTER — Encounter (HOSPITAL_COMMUNITY): Payer: Self-pay | Admitting: Emergency Medicine

## 2019-08-17 ENCOUNTER — Emergency Department (HOSPITAL_COMMUNITY): Payer: Medicare HMO

## 2019-08-17 ENCOUNTER — Other Ambulatory Visit: Payer: Self-pay

## 2019-08-17 ENCOUNTER — Ambulatory Visit (INDEPENDENT_AMBULATORY_CARE_PROVIDER_SITE_OTHER): Payer: Medicare HMO | Admitting: Family Medicine

## 2019-08-17 VITALS — BP 145/80 | HR 70 | Temp 98.5°F

## 2019-08-17 DIAGNOSIS — R29818 Other symptoms and signs involving the nervous system: Secondary | ICD-10-CM

## 2019-08-17 DIAGNOSIS — R531 Weakness: Secondary | ICD-10-CM | POA: Insufficient documentation

## 2019-08-17 DIAGNOSIS — F0391 Unspecified dementia with behavioral disturbance: Secondary | ICD-10-CM | POA: Insufficient documentation

## 2019-08-17 DIAGNOSIS — E1165 Type 2 diabetes mellitus with hyperglycemia: Secondary | ICD-10-CM

## 2019-08-17 DIAGNOSIS — Z8551 Personal history of malignant neoplasm of bladder: Secondary | ICD-10-CM | POA: Diagnosis not present

## 2019-08-17 DIAGNOSIS — R0789 Other chest pain: Secondary | ICD-10-CM | POA: Insufficient documentation

## 2019-08-17 DIAGNOSIS — R262 Difficulty in walking, not elsewhere classified: Secondary | ICD-10-CM | POA: Insufficient documentation

## 2019-08-17 DIAGNOSIS — Z888 Allergy status to other drugs, medicaments and biological substances status: Secondary | ICD-10-CM | POA: Insufficient documentation

## 2019-08-17 DIAGNOSIS — R411 Anterograde amnesia: Secondary | ICD-10-CM | POA: Diagnosis not present

## 2019-08-17 DIAGNOSIS — R69 Illness, unspecified: Secondary | ICD-10-CM | POA: Diagnosis not present

## 2019-08-17 DIAGNOSIS — Z87891 Personal history of nicotine dependence: Secondary | ICD-10-CM | POA: Diagnosis not present

## 2019-08-17 DIAGNOSIS — E785 Hyperlipidemia, unspecified: Secondary | ICD-10-CM | POA: Insufficient documentation

## 2019-08-17 DIAGNOSIS — Z8249 Family history of ischemic heart disease and other diseases of the circulatory system: Secondary | ICD-10-CM | POA: Diagnosis not present

## 2019-08-17 DIAGNOSIS — I10 Essential (primary) hypertension: Secondary | ICD-10-CM | POA: Insufficient documentation

## 2019-08-17 DIAGNOSIS — R29810 Facial weakness: Principal | ICD-10-CM | POA: Diagnosis present

## 2019-08-17 DIAGNOSIS — Z794 Long term (current) use of insulin: Secondary | ICD-10-CM | POA: Insufficient documentation

## 2019-08-17 DIAGNOSIS — Z79899 Other long term (current) drug therapy: Secondary | ICD-10-CM | POA: Diagnosis not present

## 2019-08-17 DIAGNOSIS — E119 Type 2 diabetes mellitus without complications: Secondary | ICD-10-CM | POA: Diagnosis not present

## 2019-08-17 DIAGNOSIS — R2990 Unspecified symptoms and signs involving the nervous system: Secondary | ICD-10-CM

## 2019-08-17 DIAGNOSIS — E559 Vitamin D deficiency, unspecified: Secondary | ICD-10-CM | POA: Insufficient documentation

## 2019-08-17 DIAGNOSIS — Z20828 Contact with and (suspected) exposure to other viral communicable diseases: Secondary | ICD-10-CM | POA: Insufficient documentation

## 2019-08-17 LAB — BASIC METABOLIC PANEL
Anion gap: 10 (ref 5–15)
BUN: 17 mg/dL (ref 8–23)
CO2: 27 mmol/L (ref 22–32)
Calcium: 9.1 mg/dL (ref 8.9–10.3)
Chloride: 104 mmol/L (ref 98–111)
Creatinine, Ser: 1.03 mg/dL (ref 0.61–1.24)
GFR calc Af Amer: >60 mL/min (ref >60)
GFR calc non Af Amer: >60 mL/min (ref >60)
Glucose, Bld: 276 mg/dL — ABNORMAL HIGH (ref 70–99)
Potassium: 4 mmol/L (ref 3.5–5.1)
Sodium: 141 mmol/L (ref 135–145)

## 2019-08-17 LAB — CBC
HCT: 44.4 % (ref 39.0–52.0)
Hemoglobin: 15 g/dL (ref 13.0–17.0)
MCH: 29.7 pg (ref 26.0–34.0)
MCHC: 33.8 g/dL (ref 30.0–36.0)
MCV: 87.9 fL (ref 80.0–100.0)
Platelets: 220 10*3/uL (ref 150–400)
RBC: 5.05 MIL/uL (ref 4.22–5.81)
RDW: 12.5 % (ref 11.5–15.5)
WBC: 5.7 10*3/uL (ref 4.0–10.5)
nRBC: 0 % (ref 0.0–0.2)

## 2019-08-17 LAB — PROTIME-INR
INR: 1 (ref 0.8–1.2)
Prothrombin Time: 13 seconds (ref 11.4–15.2)

## 2019-08-17 LAB — MAGNESIUM: Magnesium: 1.9 mg/dL (ref 1.7–2.4)

## 2019-08-17 LAB — SARS CORONAVIRUS 2 BY RT PCR (HOSPITAL ORDER, PERFORMED IN ~~LOC~~ HOSPITAL LAB): SARS Coronavirus 2: NEGATIVE

## 2019-08-17 LAB — TROPONIN I (HIGH SENSITIVITY)
Troponin I (High Sensitivity): 3 ng/L (ref <18)
Troponin I (High Sensitivity): 3 ng/L (ref <18)

## 2019-08-17 LAB — GLUCOSE, CAPILLARY
Glucose-Capillary: 301 mg/dL — ABNORMAL HIGH (ref 70–99)
Glucose-Capillary: 216 mg/dL — ABNORMAL HIGH (ref 70–99)

## 2019-08-17 LAB — CBG MONITORING, ED: Glucose-Capillary: 243 mg/dL — ABNORMAL HIGH (ref 70–99)

## 2019-08-17 LAB — GLUCOSE HEMOCUE WAIVED: Glu Hemocue Waived: 315 mg/dL — ABNORMAL HIGH (ref 65–99)

## 2019-08-17 IMAGING — MR MR MRA NECK WO/W CM
2 series · 18 of 48 positions shown · IV contrast (gadavist)
Comparison: Prior head CT from earlier the same day.

CLINICAL DATA: Initial evaluation for acute left-sided facial droop
and left-sided weakness for 2 days.



[Series 4: TOF · axial · 1.0mm · 0.52mm/px · z∈[-190,-94]mm · 11 of 104 slices shown]
[im 4/104]
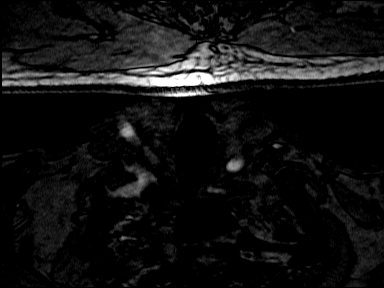
[im 16/104]
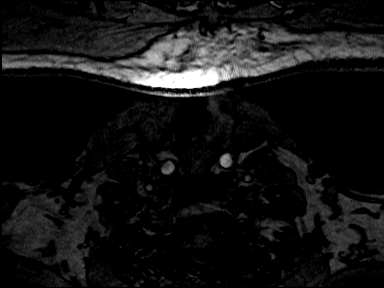
[im 20/104]
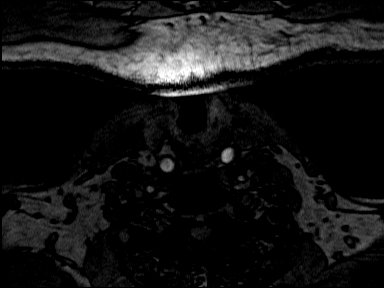
[im 32/104]
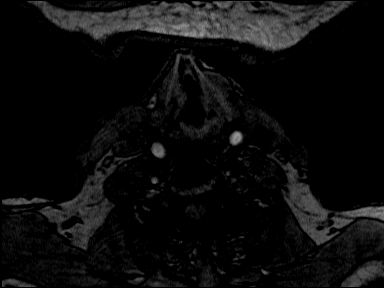
[im 44/104]
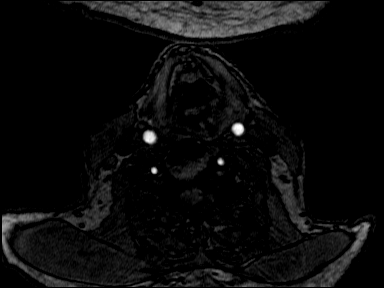
[im 52/104]
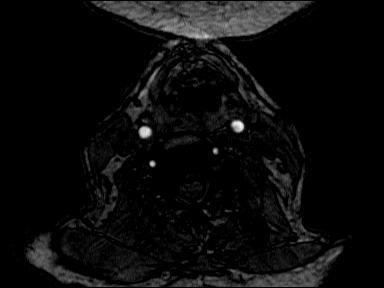
[im 60/104]
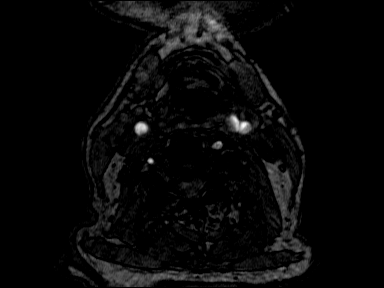
[im 72/104]
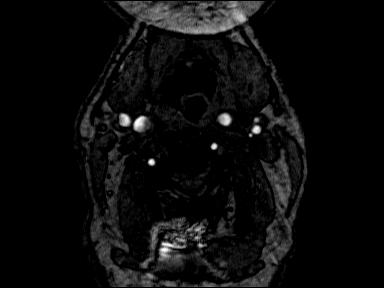
[im 84/104]
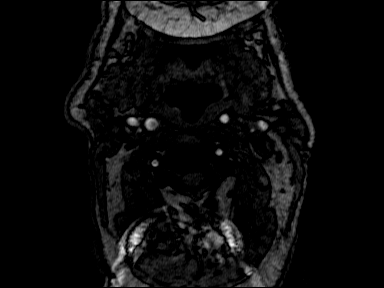
[im 88/104]
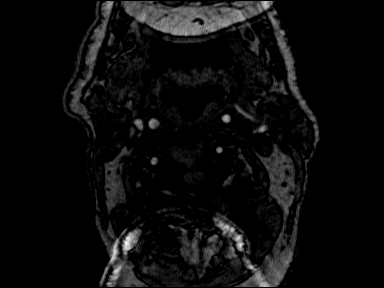
[im 100/104]
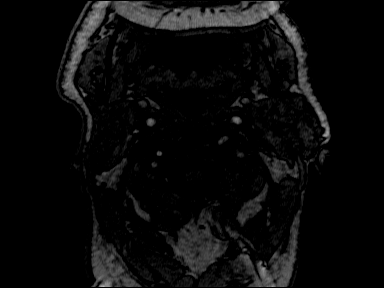

[Series 9: MRA · coronal · 1.2mm · 0.41mm/px · 7 of 80 slices shown]
[im 4/80]
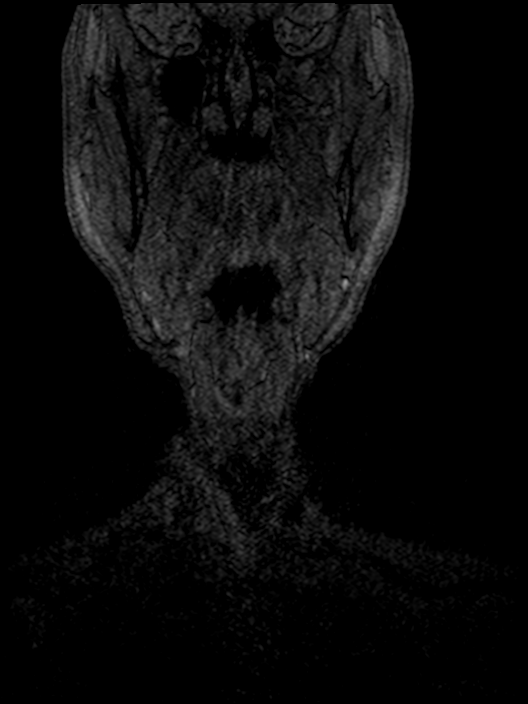
[im 12/80]
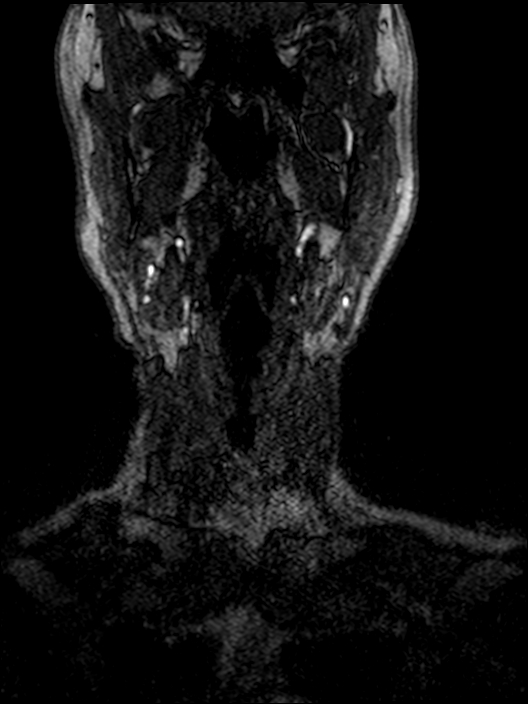
[im 24/80]
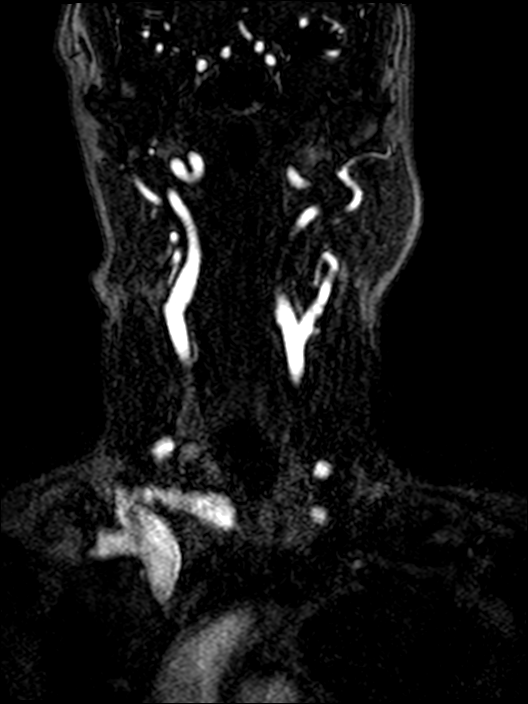
[im 36/80]
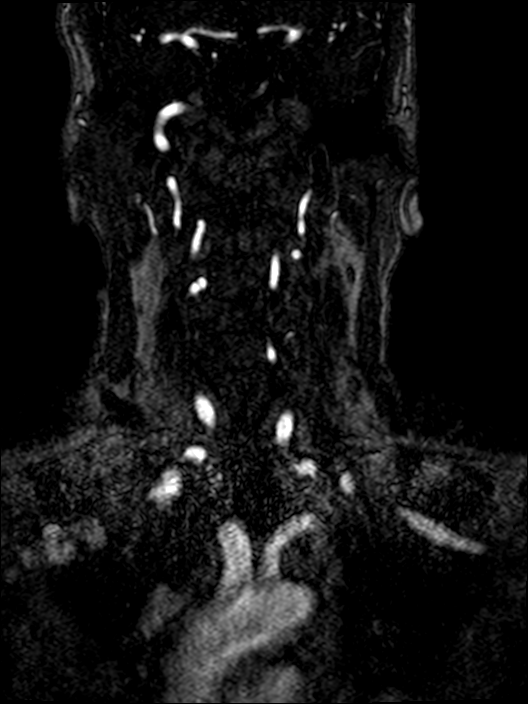
[im 40/80]
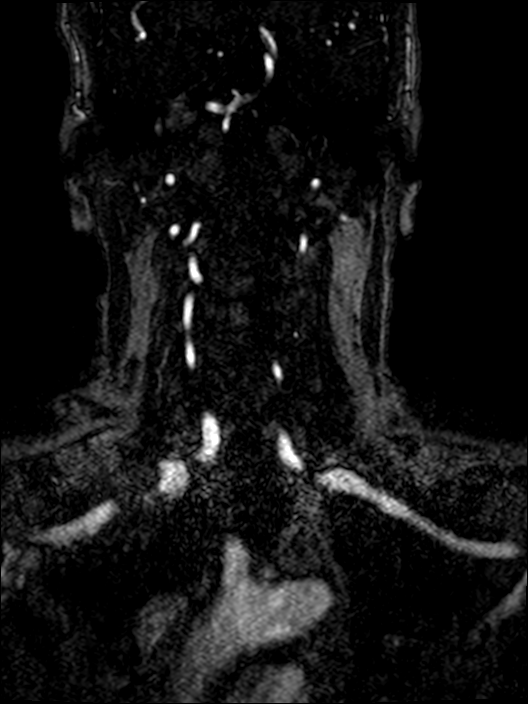
[im 44/80]
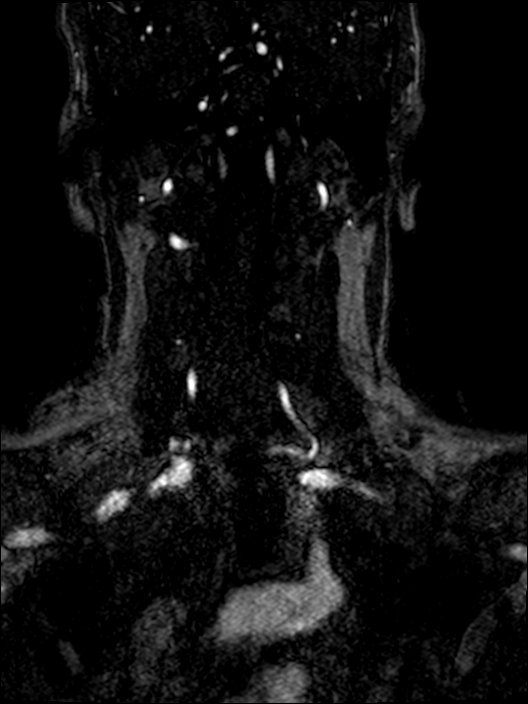
[im 68/80]
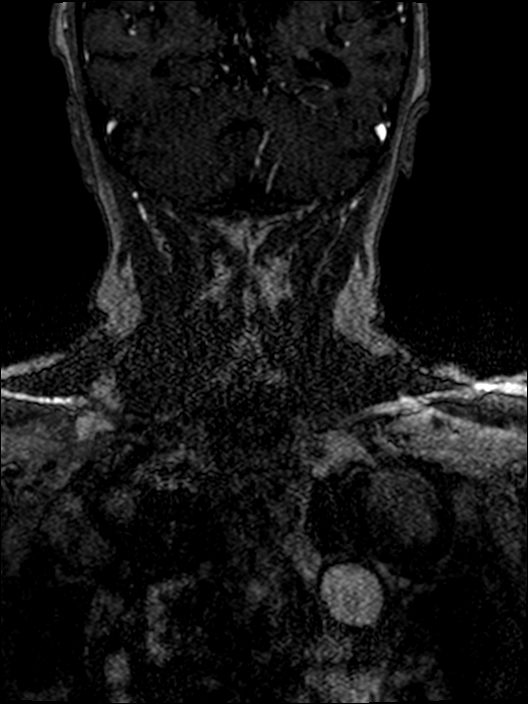

[18 of 48 positions shown; findings below may reference images not displayed]

FINDINGS: MRI HEAD FINDINGS

Brain: Diffuse prominence of the CSF containing spaces compatible
with generalized age-related cerebral atrophy. Patchy and confluent
T2/FLAIR hyperintensity within the periventricular deep white matter
both cerebral hemispheres most consistent with chronic small vessel
ischemic disease, moderate nature. Multiple scattered superimposed
remote lacunar infarcts seen involving the hemispheric cerebral
white matter bilaterally, with additional remote lacunar infarcts
involving the bilateral basal ganglia and left thalamus.

No abnormal foci of restricted diffusion to suggest acute or
subacute ischemia. Gray-white matter differentiation maintained. No
areas of remote cortical infarction. No acute intracranial
hemorrhage. Multiple scattered chronic micro hemorrhages seen
predominantly clustered about the deep gray nuclei and cerebellum,
most likely related to chronic poorly controlled hypertension.

No mass lesion, midline shift or mass effect. Of hydrocephalus. No
extra-axial fluid collection. Pituitary gland suprasellar region
normal. Midline structures intact. Ventricular prominence related to
global parenchymal volume loss

Vascular: Major intracranial vascular flow voids grossly maintained
at the skull base.

Skull and upper cervical spine: Craniocervical junction within
normal limits. Visualized upper cervical spine normal. Bone marrow
signal intensity within normal limits. No scalp soft tissue
abnormality.

Sinuses/Orbits: Globes and orbital soft tissues demonstrate no acute
finding. Mild scattered mucosal thickening noted throughout the
paranasal sinuses. No air-fluid level to suggest acute sinusitis. No
mastoid effusion. Inner ear structures grossly normal.

Other: None.

MRA HEAD FINDINGS

ANTERIOR CIRCULATION:

Examination technically limited by motion. Distal cervical segments
of the internal carotid arteries are widely patent with symmetric
antegrade flow. Petrous, cavernous, and supraclinoid segments widely
patent without flow-limiting stenosis. A1 segments patent
bilaterally. Normal anterior communicating artery complex. Anterior
cerebral arteries patent to their distal aspects without
flow-limiting stenosis. No M1 stenosis or occlusion. Normal MCA
bifurcations. Distal MCA branches well perfused and symmetric.

POSTERIOR CIRCULATION:

Evaluation of the vertebral arteries mildly limited by artifact on
this exam. Vertebral arteries patent as they course into the cranial
vault, and are grossly patent to the vertebrobasilar junction
without flow-limiting stenosis. Focal signal loss within the distal
left V4 segment, consistent with artifact as this appears patent on
corresponding MRA of the neck. Normal flow void seen within this
region on corresponding MRI. Left PICA patent. Right PICA not
visualized. Basilar tortuous with short-segment fenestration at its
proximal aspect, best seen on corresponding MRA of the neck.
Short-segment mild stenosis at the distal basilar (series 1, image
75). Basilar otherwise widely patent. Superior cerebral arteries
patent proximally. Left PCA primarily supplied via the basilar.
Hypoplastic right P1 segment with robust right posterior
communicating artery supplies the right PCA. Right PCA perfused to
its distal aspect without stenosis. Left PCA patent proximally, not
well seen distally, most consistent with artifact as this appears
patent on corresponding MRA of the neck.

No intracranial aneurysm.

MRA NECK FINDINGS

Source images reviewed.

Visualized aortic arch of normal caliber with normal 3 vessel
morphology. No hemodynamically significant stenosis seen about the
origin of the great vessels. Visualized subclavian arteries widely
patent.

Right CCA tortuous proximally but widely patent to the bifurcation
without stenosis. No significant atheromatous narrowing seen about
the right bifurcation. Right ICA tortuous but widely patent to the
skull base without stenosis, dissection or occlusion.

Left CCA tortuous proximally but widely patent to the bifurcation
without stenosis. Minor atheromatous change about the proximal left
ICA without hemodynamically significant stenosis. Left ICA tortuous
distally but widely patent to the skull base without stenosis,
dissection or occlusion.

Both vertebral arteries arise from the subclavian arteries. Origin
of the vertebral arteries not well assessed due to motion.
Visualized vertebral arteries widely patent within the neck without
stenosis, dissection or occlusion. Right vertebral artery slightly
dominant.
IMPRESSION: MRI HEAD IMPRESSION:

1. No acute intracranial infarct or other abnormality identified.
2. Moderately advanced cerebral atrophy with chronic microvascular
ischemic disease, with multiple remote lacunar infarcts involving
the hemispheric cerebral white matter and deep gray nuclei.
3. Multiple scattered chronic micro hemorrhages clustered about the
deep gray nuclei and cerebellum, most consistent with underlying
chronic hypertensive encephalopathy.

MRA HEAD IMPRESSION:

1. Negative intracranial MRA for large vessel occlusion.
2. Short-segment mild stenosis involving the distal basilar artery.
No other hemodynamically significant or proximal correctable
stenosis identified.
3. No intracranial aneurysm.

MRA NECK IMPRESSION:

1. Wide patency of both carotid artery systems within the neck. No
hemodynamically significant stenosis or other acute vascular
abnormality.
2. Wide patency of both vertebral arteries within the neck.
3. Diffuse tortuosity of the major arterial vasculature of the neck,
suggesting chronic underlying hypertension.

## 2019-08-17 IMAGING — DX DG CHEST 2V
2 series · 2 of 2 positions shown · non-contrast
Comparison: [DATE]

CLINICAL DATA: Per Triage Note: Patient reports progressive
weakness that started several days ago. States he is having
difficulty walking. Sent by [facility] for eval for possible
stroke.generalized weakness

EXAM:
CHEST - 2 VIEW

[chest pa]
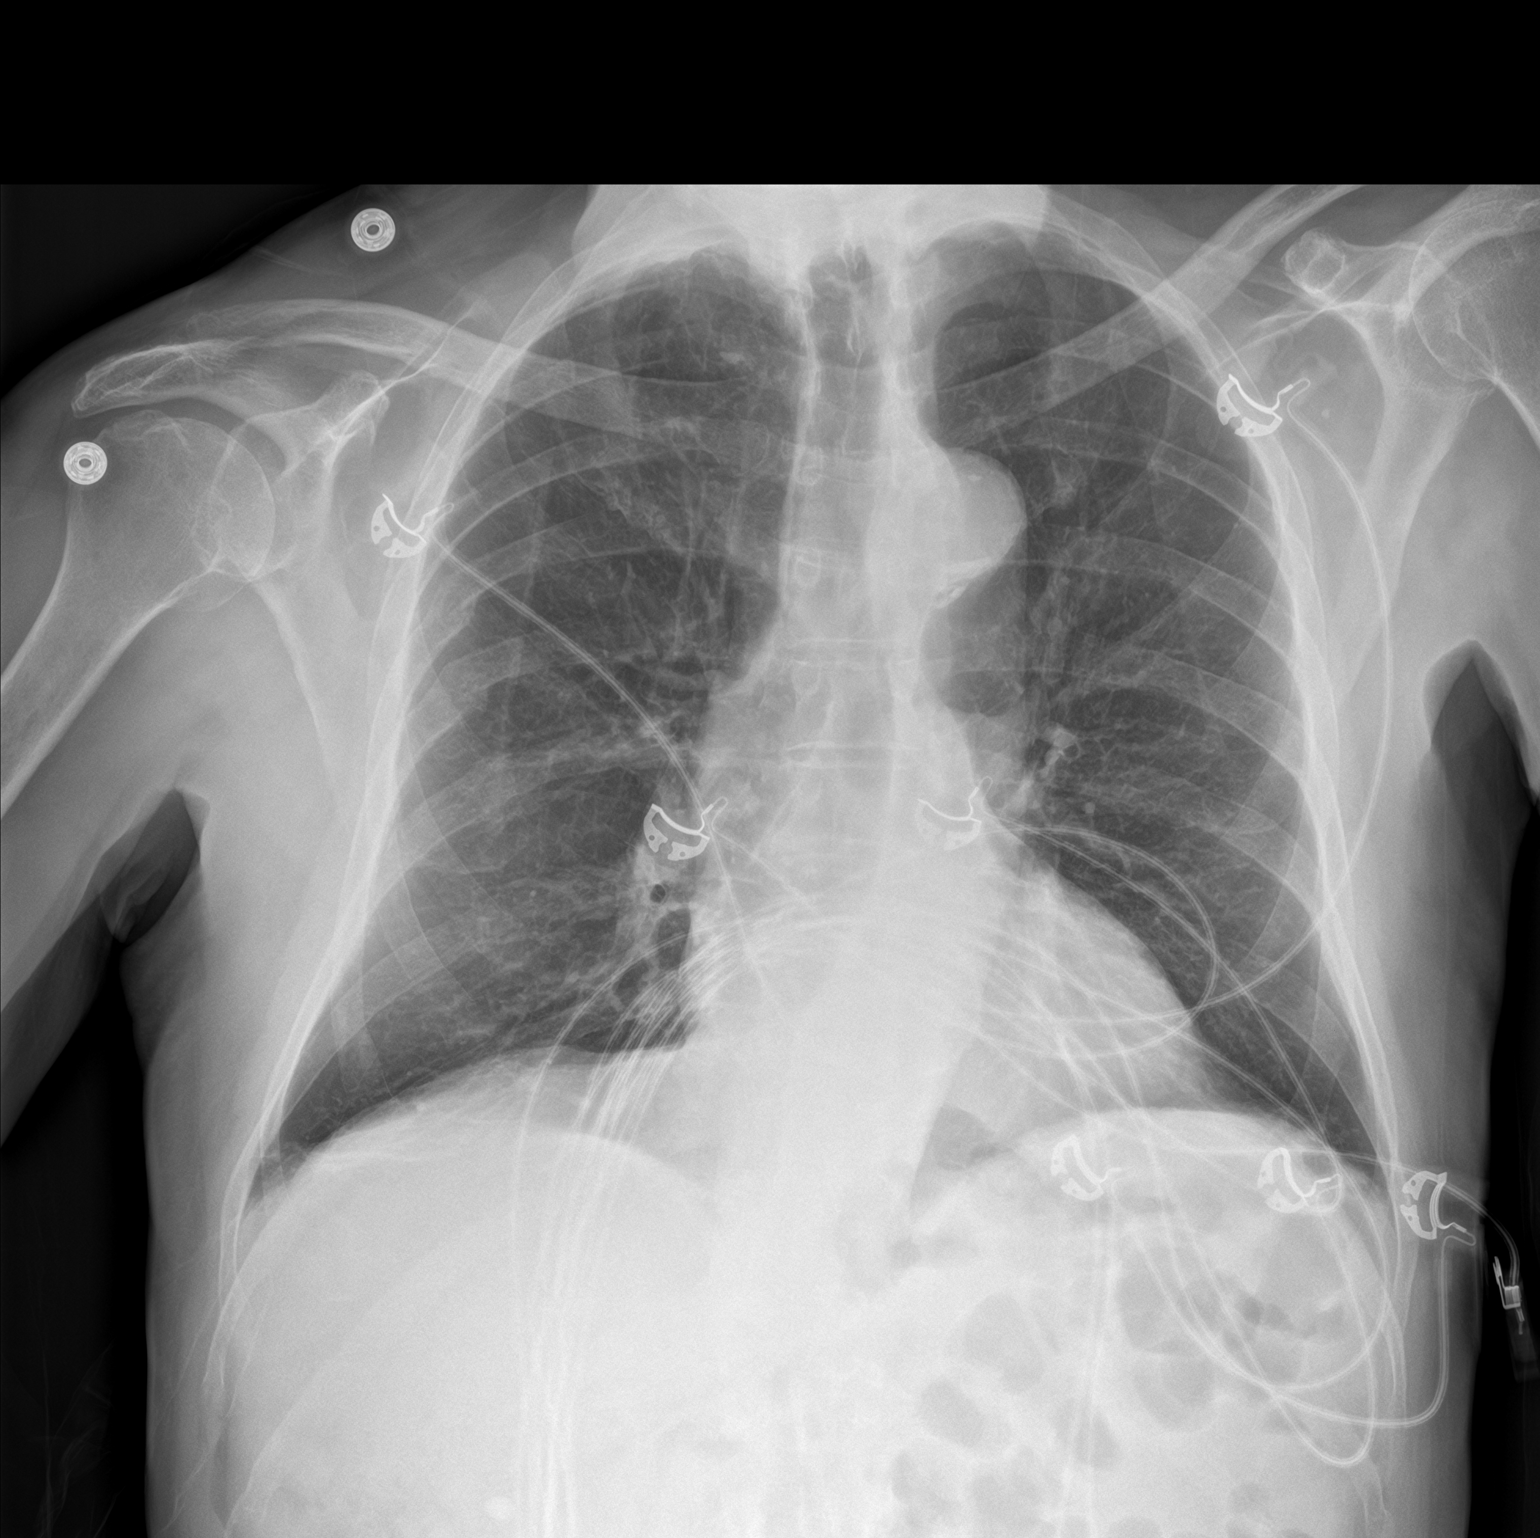

[chest lat]
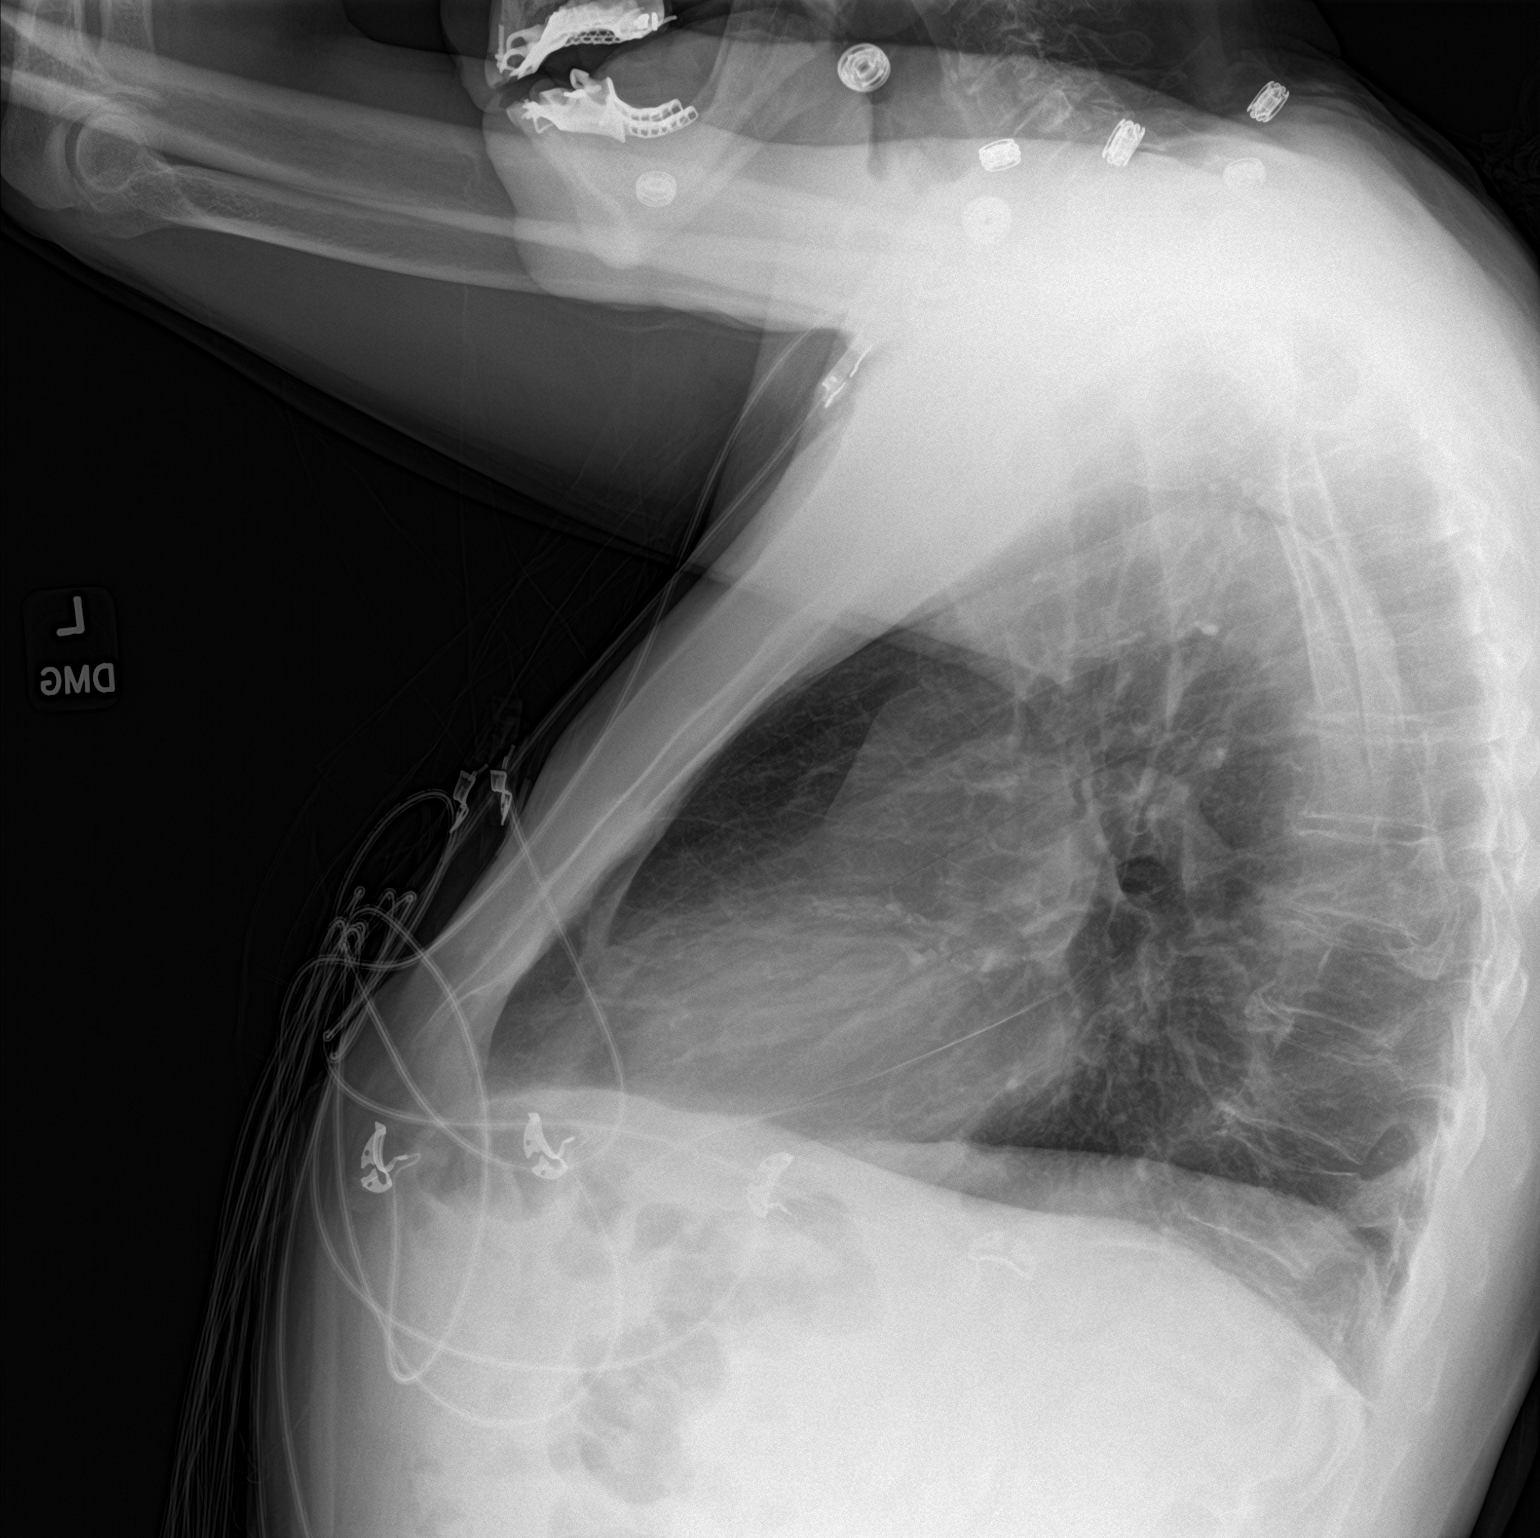

[2 of 2 positions shown; findings below may reference images not displayed]

FINDINGS: Normal cardiac silhouette with ectatic aorta. Lungs are mildly
hyperinflated. No effusion, infiltrate pneumothorax. Remote RIGHT
rib fracture.
IMPRESSION: No acute cardiopulmonary process.

## 2019-08-17 IMAGING — MR MR HEAD W/O CM
8 of 10 series · 30 of 48 positions shown · IV contrast (gadavist)
Comparison: Prior head CT from earlier the same day.

CLINICAL DATA: Initial evaluation for acute left-sided facial droop
and left-sided weakness for 2 days.



[Series 2: T1 · sagittal · 5.0mm · 0.43mm/px · 2 of 22 slices shown (1 of 2)]
[im 1/22]
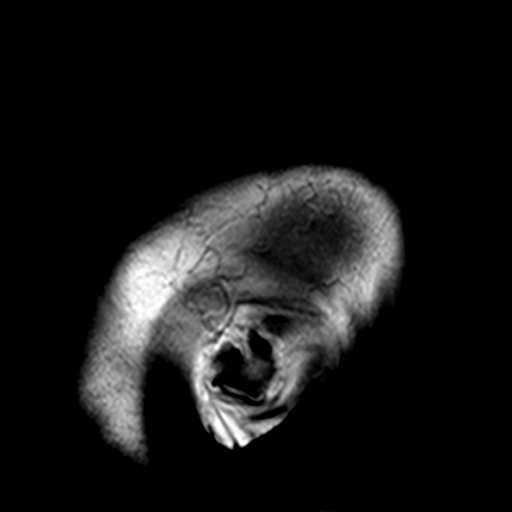
[im 22/22]
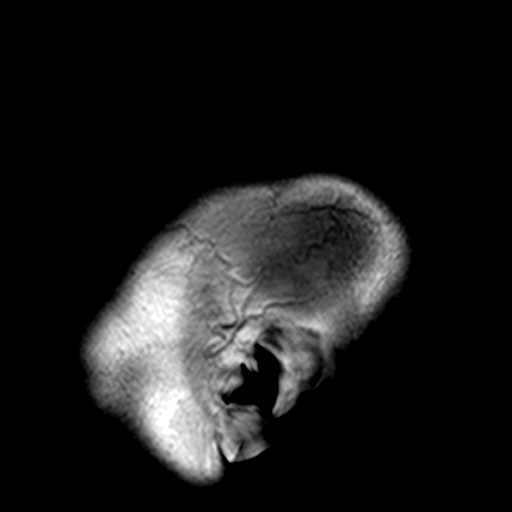

[Series 3: DWI · axial · 3.0mm · 0.73mm/px · z∈[-64,+94]mm · 6 of 55 slices shown (1 of 2)]
[im 1/55]
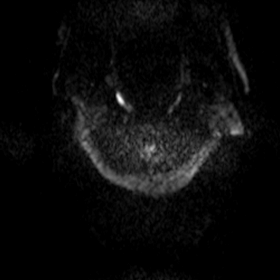
[im 11/55]
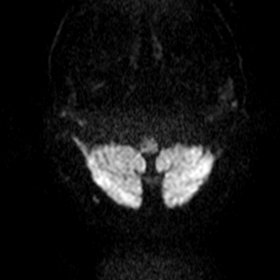
[im 22/55]
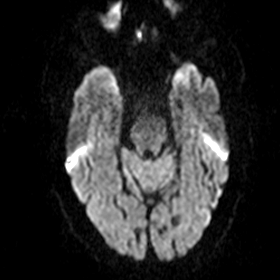
[im 33/55]
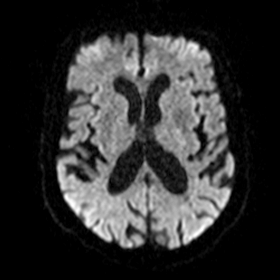
[im 44/55]
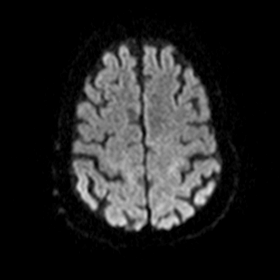
[im 55/55]
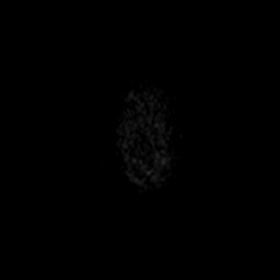

[Series 5: DWI · coronal · 5.0mm · 0.50mm/px · 4 of 36 slices shown (2 of 2)]
[im 1/36]
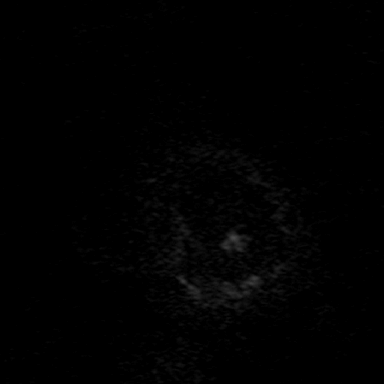
[im 12/36]
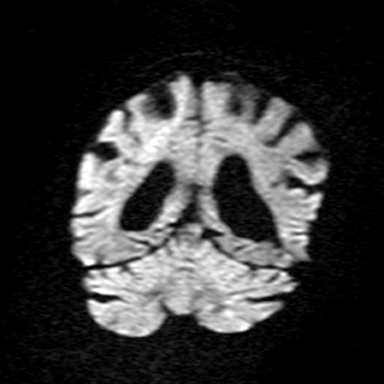
[im 24/36]
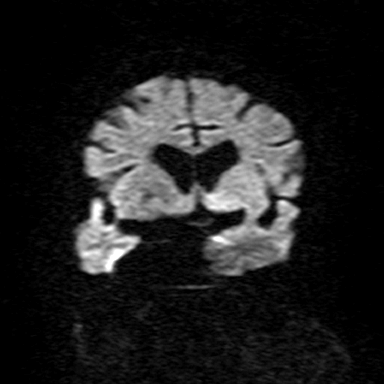
[im 36/36]
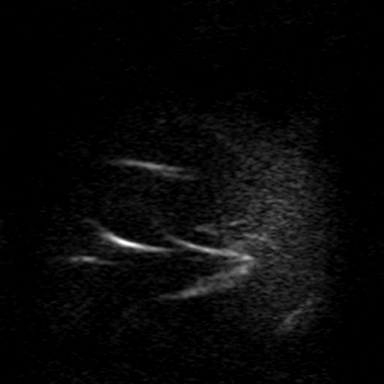

[Series 7: T2 · axial · 5.0mm · 0.70mm/px · z∈[-47,+93]mm · 5 of 46 slices shown (1 of 3)]
[im 1/46]
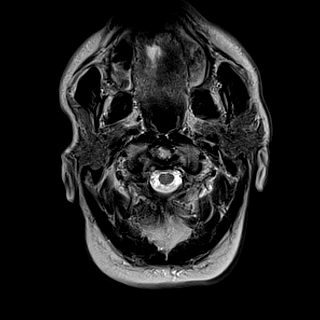
[im 12/46]
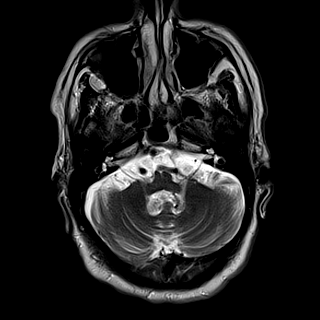
[im 23/46]
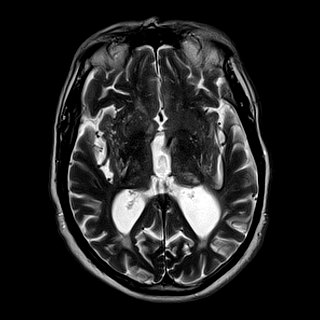
[im 34/46]
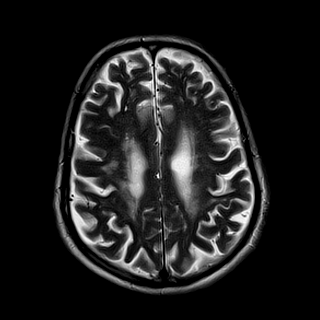
[im 46/46]
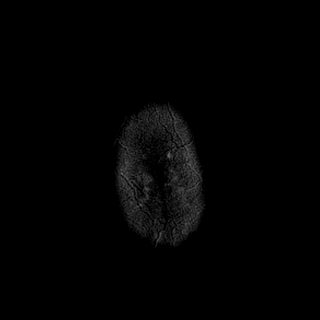

[Series 8: T2 · axial · 5.0mm · 0.43mm/px · z∈[-48,+92]mm · 3 of 23 slices shown (2 of 3)]
[im 1/23]
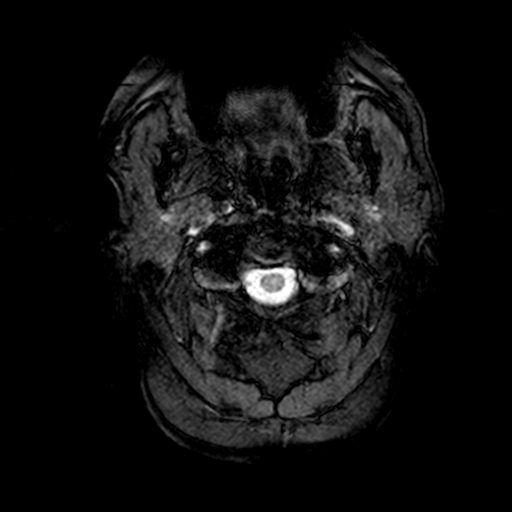
[im 12/23]
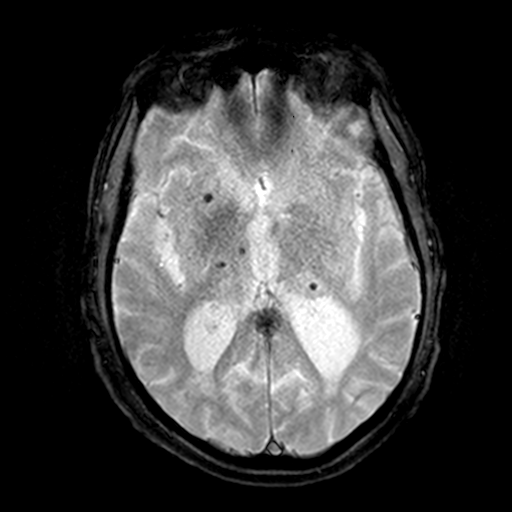
[im 23/23]
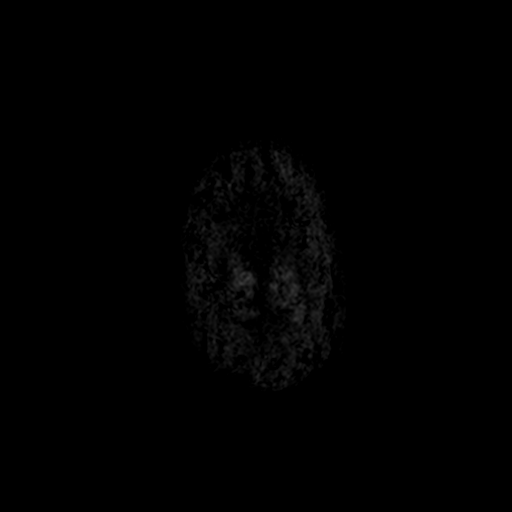

[Series 9: FLAIR · axial · 3.0mm · 0.86mm/px · z∈[-50,+91]mm · 6 of 49 slices shown]
[im 1/49]
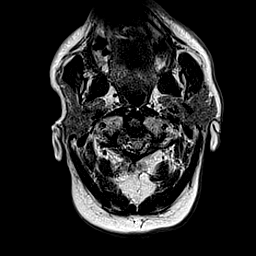
[im 10/49]
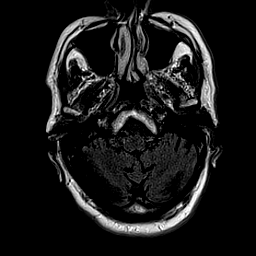
[im 20/49]
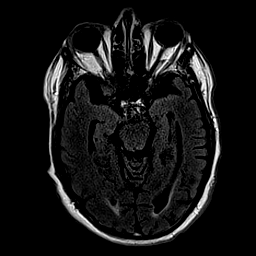
[im 29/49]
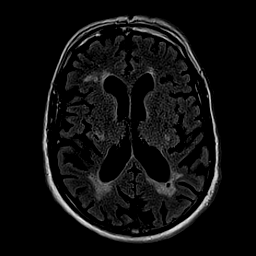
[im 39/49]
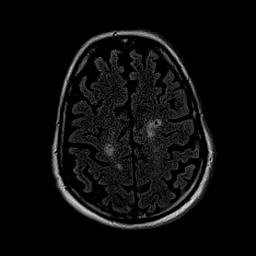
[im 49/49]
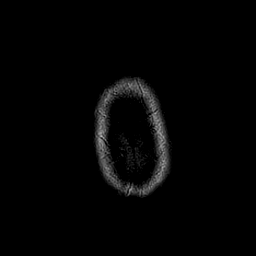

[Series 10: T1 · axial · 2.0mm · 0.43mm/px · 1 of 78 slices shown (2 of 2)]
[im 1/78]
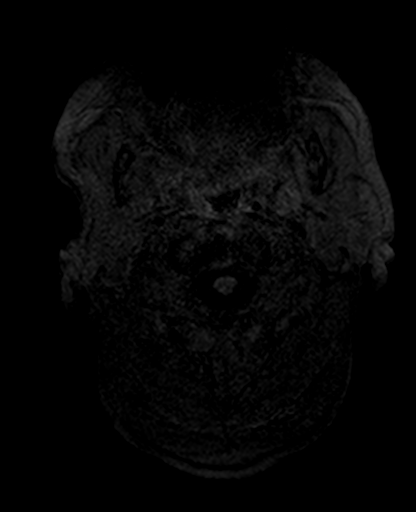

[Series 11: T2 · coronal · 5.0mm · 0.57mm/px · 3 of 28 slices shown (3 of 3)]
[im 1/28]
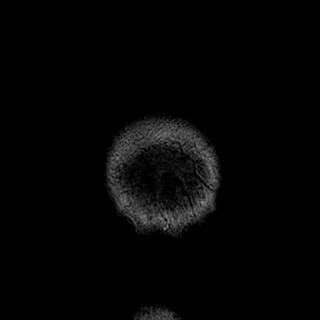
[im 14/28]
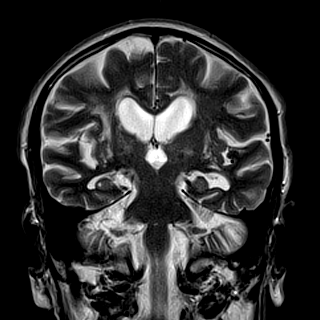
[im 28/28]
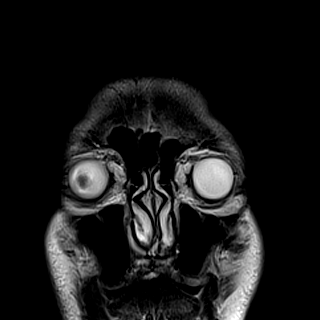

[30 of 48 positions shown; findings below may reference images not displayed]

FINDINGS: MRI HEAD FINDINGS

Brain: Diffuse prominence of the CSF containing spaces compatible
with generalized age-related cerebral atrophy. Patchy and confluent
T2/FLAIR hyperintensity within the periventricular deep white matter
both cerebral hemispheres most consistent with chronic small vessel
ischemic disease, moderate nature. Multiple scattered superimposed
remote lacunar infarcts seen involving the hemispheric cerebral
white matter bilaterally, with additional remote lacunar infarcts
involving the bilateral basal ganglia and left thalamus.

No abnormal foci of restricted diffusion to suggest acute or
subacute ischemia. Gray-white matter differentiation maintained. No
areas of remote cortical infarction. No acute intracranial
hemorrhage. Multiple scattered chronic micro hemorrhages seen
predominantly clustered about the deep gray nuclei and cerebellum,
most likely related to chronic poorly controlled hypertension.

No mass lesion, midline shift or mass effect. Of hydrocephalus. No
extra-axial fluid collection. Pituitary gland suprasellar region
normal. Midline structures intact. Ventricular prominence related to
global parenchymal volume loss

Vascular: Major intracranial vascular flow voids grossly maintained
at the skull base.

Skull and upper cervical spine: Craniocervical junction within
normal limits. Visualized upper cervical spine normal. Bone marrow
signal intensity within normal limits. No scalp soft tissue
abnormality.

Sinuses/Orbits: Globes and orbital soft tissues demonstrate no acute
finding. Mild scattered mucosal thickening noted throughout the
paranasal sinuses. No air-fluid level to suggest acute sinusitis. No
mastoid effusion. Inner ear structures grossly normal.

Other: None.

MRA HEAD FINDINGS

ANTERIOR CIRCULATION:

Examination technically limited by motion. Distal cervical segments
of the internal carotid arteries are widely patent with symmetric
antegrade flow. Petrous, cavernous, and supraclinoid segments widely
patent without flow-limiting stenosis. A1 segments patent
bilaterally. Normal anterior communicating artery complex. Anterior
cerebral arteries patent to their distal aspects without
flow-limiting stenosis. No M1 stenosis or occlusion. Normal MCA
bifurcations. Distal MCA branches well perfused and symmetric.

POSTERIOR CIRCULATION:

Evaluation of the vertebral arteries mildly limited by artifact on
this exam. Vertebral arteries patent as they course into the cranial
vault, and are grossly patent to the vertebrobasilar junction
without flow-limiting stenosis. Focal signal loss within the distal
left V4 segment, consistent with artifact as this appears patent on
corresponding MRA of the neck. Normal flow void seen within this
region on corresponding MRI. Left PICA patent. Right PICA not
visualized. Basilar tortuous with short-segment fenestration at its
proximal aspect, best seen on corresponding MRA of the neck.
Short-segment mild stenosis at the distal basilar (series 1, image
75). Basilar otherwise widely patent. Superior cerebral arteries
patent proximally. Left PCA primarily supplied via the basilar.
Hypoplastic right P1 segment with robust right posterior
communicating artery supplies the right PCA. Right PCA perfused to
its distal aspect without stenosis. Left PCA patent proximally, not
well seen distally, most consistent with artifact as this appears
patent on corresponding MRA of the neck.

No intracranial aneurysm.

MRA NECK FINDINGS

Source images reviewed.

Visualized aortic arch of normal caliber with normal 3 vessel
morphology. No hemodynamically significant stenosis seen about the
origin of the great vessels. Visualized subclavian arteries widely
patent.

Right CCA tortuous proximally but widely patent to the bifurcation
without stenosis. No significant atheromatous narrowing seen about
the right bifurcation. Right ICA tortuous but widely patent to the
skull base without stenosis, dissection or occlusion.

Left CCA tortuous proximally but widely patent to the bifurcation
without stenosis. Minor atheromatous change about the proximal left
ICA without hemodynamically significant stenosis. Left ICA tortuous
distally but widely patent to the skull base without stenosis,
dissection or occlusion.

Both vertebral arteries arise from the subclavian arteries. Origin
of the vertebral arteries not well assessed due to motion.
Visualized vertebral arteries widely patent within the neck without
stenosis, dissection or occlusion. Right vertebral artery slightly
dominant.
IMPRESSION: MRI HEAD IMPRESSION:

1. No acute intracranial infarct or other abnormality identified.
2. Moderately advanced cerebral atrophy with chronic microvascular
ischemic disease, with multiple remote lacunar infarcts involving
the hemispheric cerebral white matter and deep gray nuclei.
3. Multiple scattered chronic micro hemorrhages clustered about the
deep gray nuclei and cerebellum, most consistent with underlying
chronic hypertensive encephalopathy.

MRA HEAD IMPRESSION:

1. Negative intracranial MRA for large vessel occlusion.
2. Short-segment mild stenosis involving the distal basilar artery.
No other hemodynamically significant or proximal correctable
stenosis identified.
3. No intracranial aneurysm.

MRA NECK IMPRESSION:

1. Wide patency of both carotid artery systems within the neck. No
hemodynamically significant stenosis or other acute vascular
abnormality.
2. Wide patency of both vertebral arteries within the neck.
3. Diffuse tortuosity of the major arterial vasculature of the neck,
suggesting chronic underlying hypertension.

## 2019-08-17 IMAGING — CT CT HEAD W/O CM
3 series · 16 of 47 positions shown, 19 images · non-contrast
Comparison: Head CT scan [DATE].

CLINICAL DATA: Progressive weakness developing over the past
several days.

EXAM:
CT HEAD WITHOUT CONTRAST
TECHNIQUE: Contiguous axial images were obtained from the base of the skull
through the vertex without intravenous contrast.

[Series 2: head w o · axial · 0.45mm/px · z∈[+56,+181]mm · 10 of 30 slices shown, 13 images]
[im 3/30  brain]
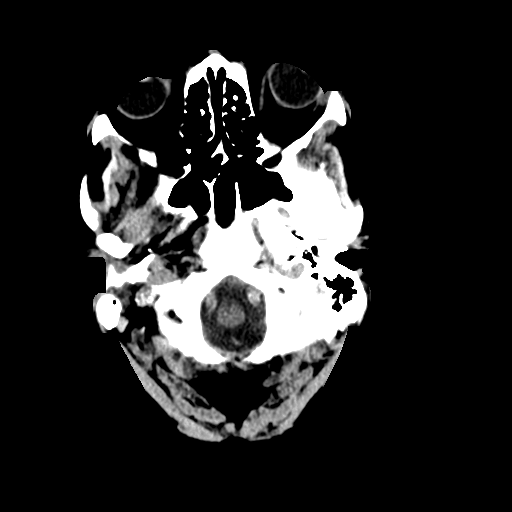
[im 3/30  bone]
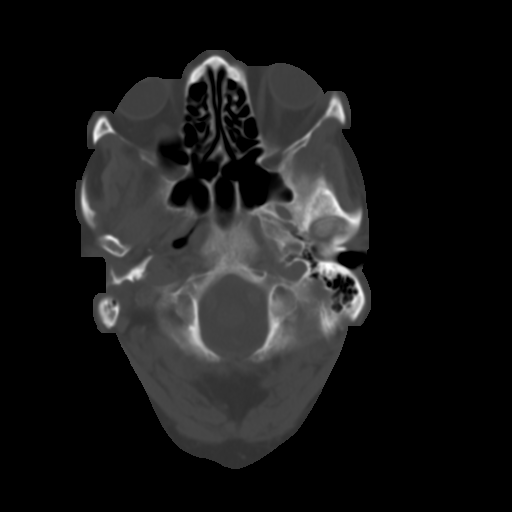
[im 6/30  brain]
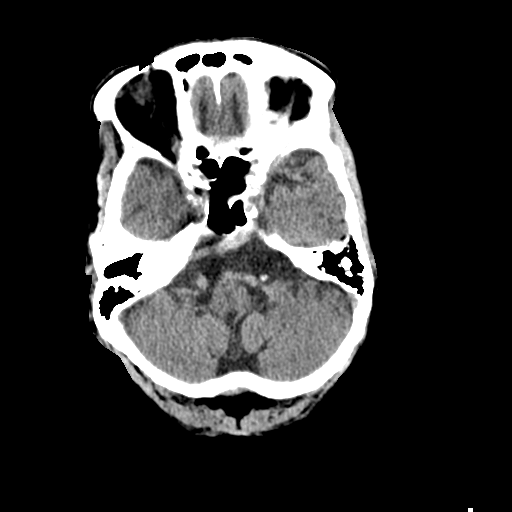
[im 9/30  brain]
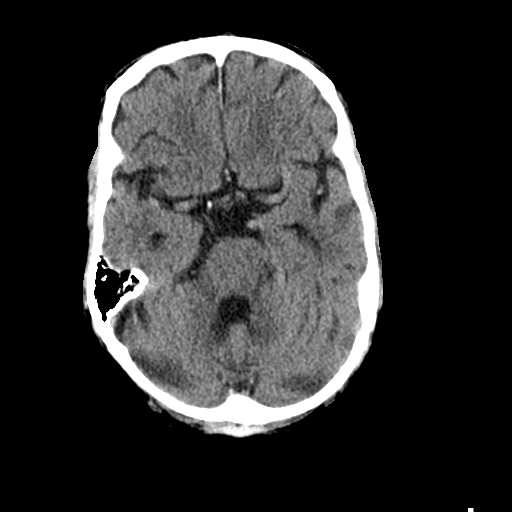
[im 11/30  brain]
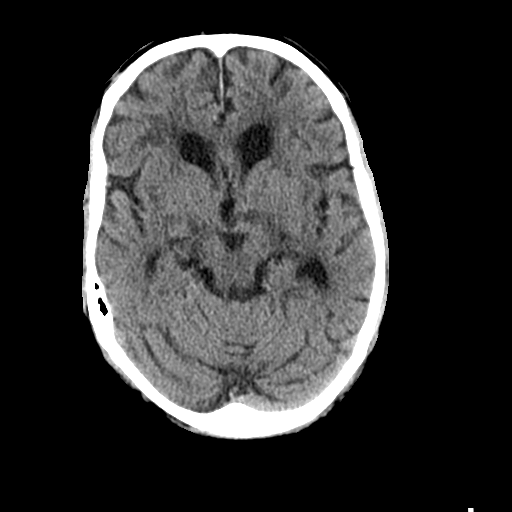
[im 14/30  brain]
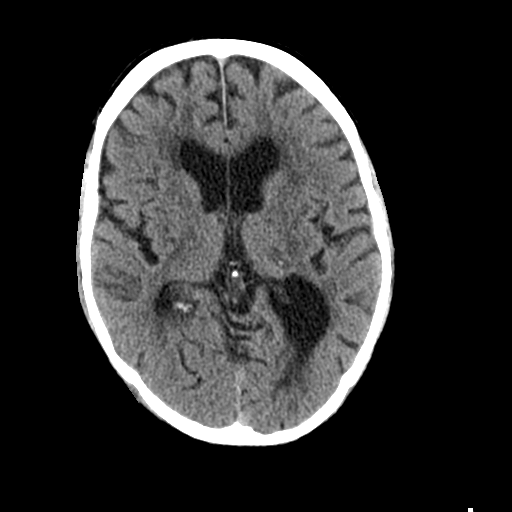
[im 14/30  bone]
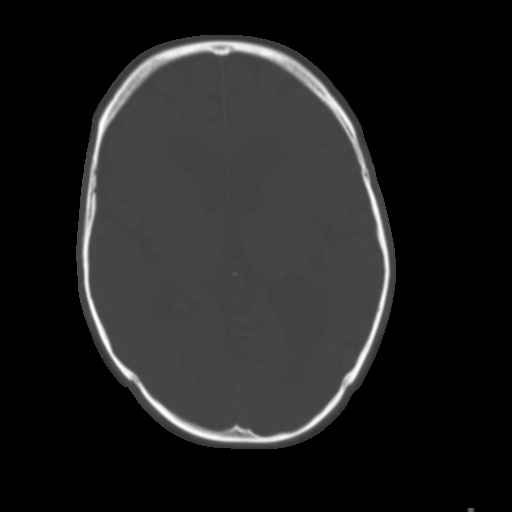
[im 17/30  brain]
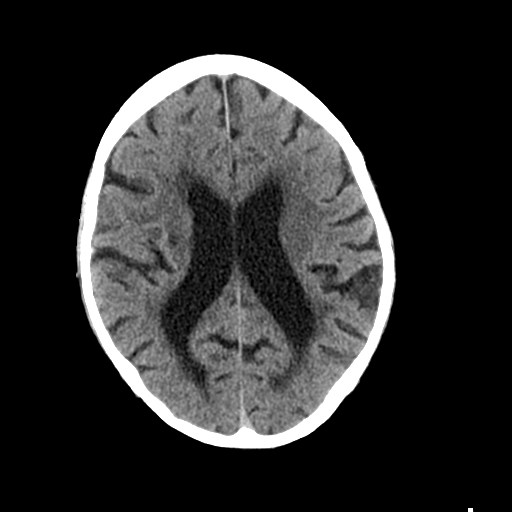
[im 20/30  brain]
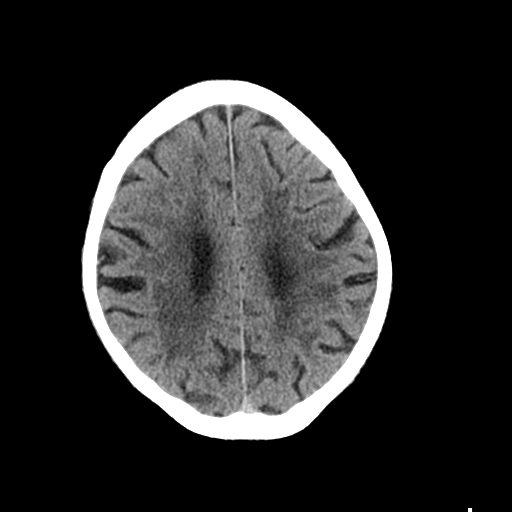
[im 23/30  brain]
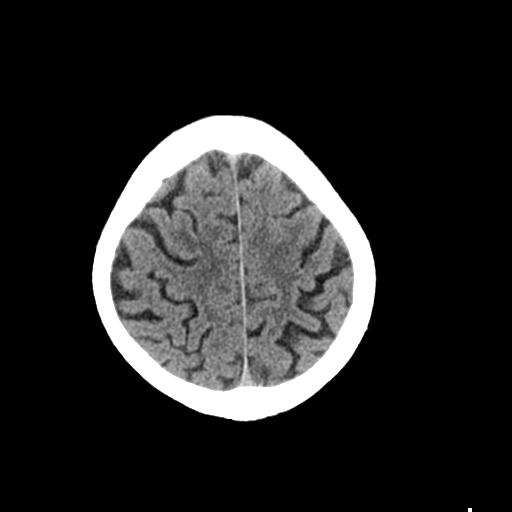
[im 25/30  brain]
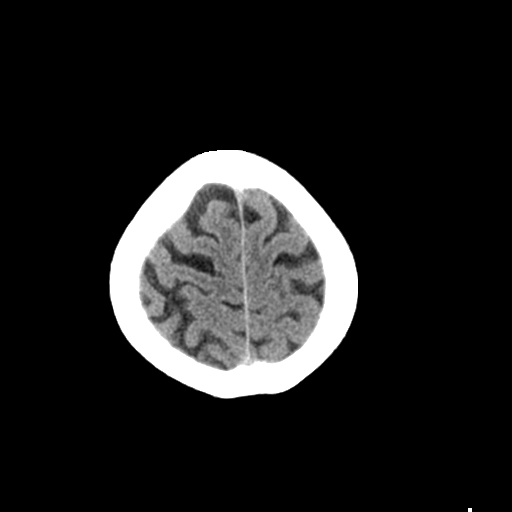
[im 25/30  bone]
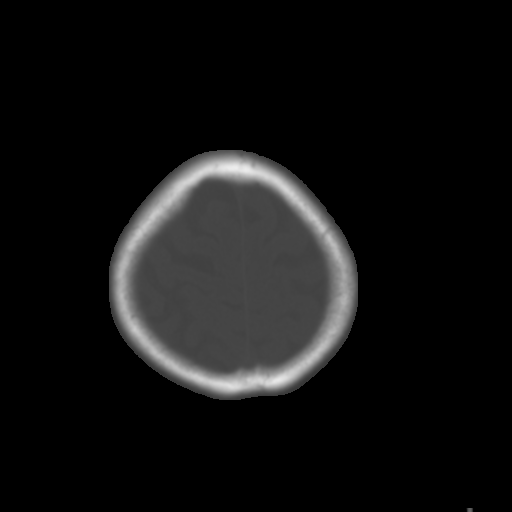
[im 28/30  brain]
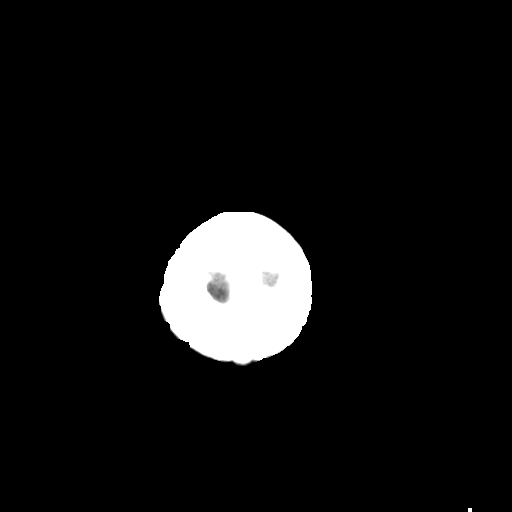

[Series 4: coronal soft · coronal · 0.29mm/px · 3 of 66 slices shown]
[im 22/66  brain]
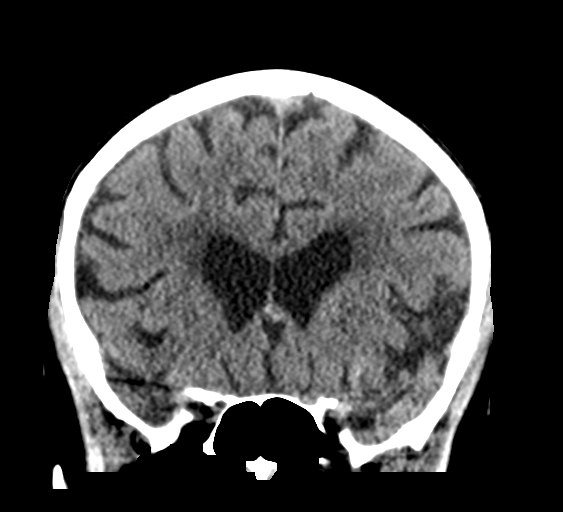
[im 29/66  brain]
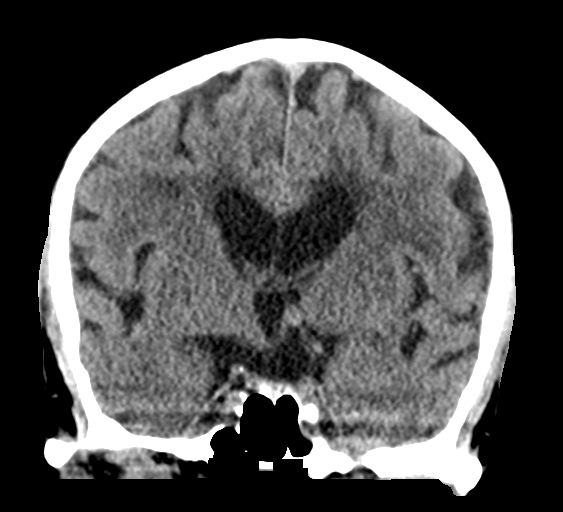
[im 37/66  brain]
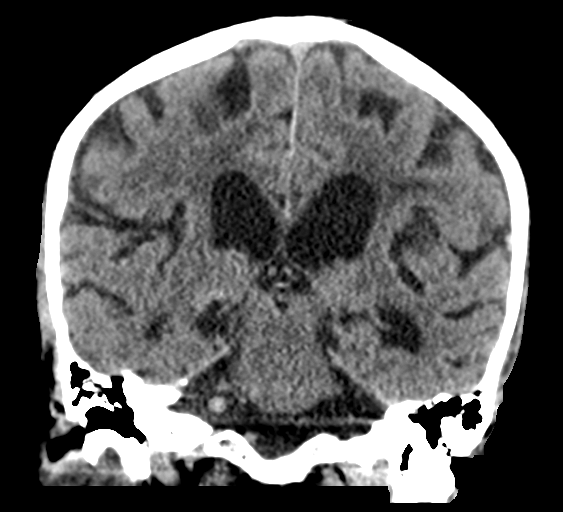

[Series 5: sagittal soft · sagittal · 0.29mm/px · 3 of 55 slices shown]
[im 19/55  brain]
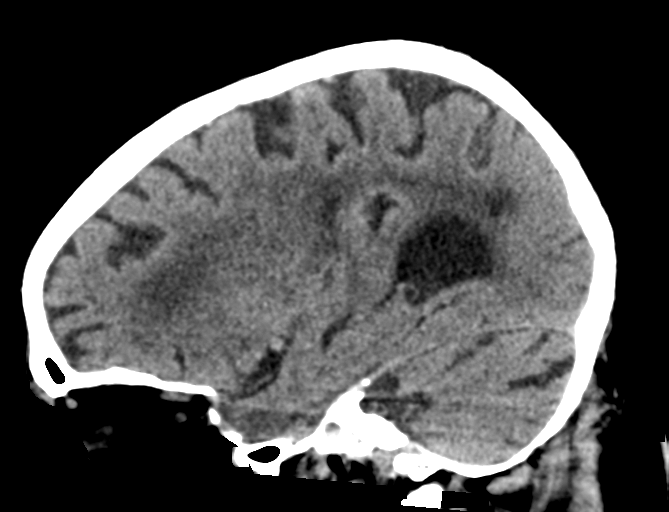
[im 28/55  brain]
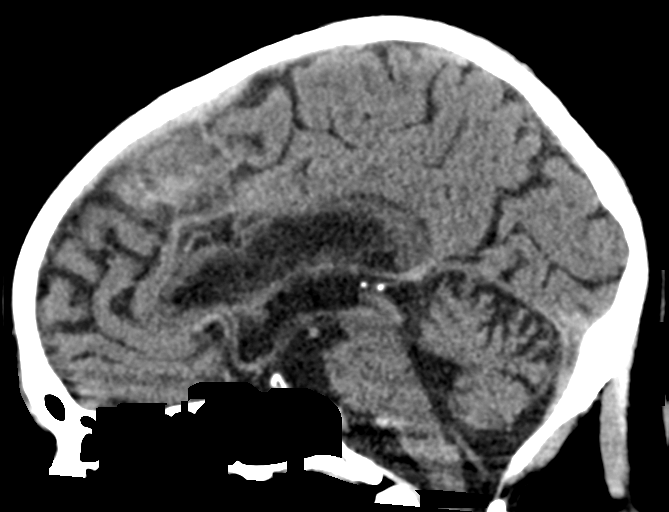
[im 37/55  brain]
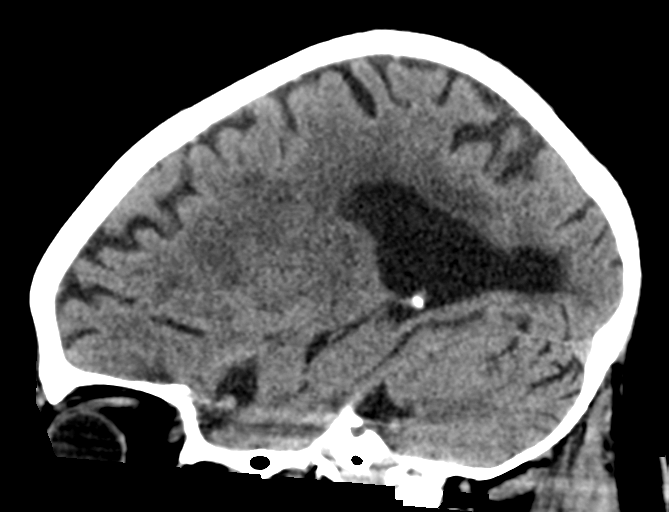

[16 of 47 positions shown; findings below may reference images not displayed]

FINDINGS: Brain: No evidence of acute infarction, hemorrhage, hydrocephalus,
extra-axial collection or mass lesion/mass effect. Atrophy and
chronic microvascular ischemic change noted.

Vascular: No hyperdense vessel or unexpected calcification.

Skull: Intact.  No focal lesion.

Sinuses/Orbits: Negative.

Other: None.
IMPRESSION: No acute abnormality.

Atrophy and chronic microvascular ischemic change.

## 2019-08-17 IMAGING — MR MR MRA HEAD W/O CM
1 series · 13 of 48 positions shown · IV contrast (gadavist)
Comparison: Prior head CT from earlier the same day.

CLINICAL DATA: Initial evaluation for acute left-sided facial droop
and left-sided weakness for 2 days.



[Series 1: TOF fat-sat · axial · 0.8mm · 0.36mm/px · z∈[-36,+58]mm · 13 of 131 slices shown]
[im 1/131]
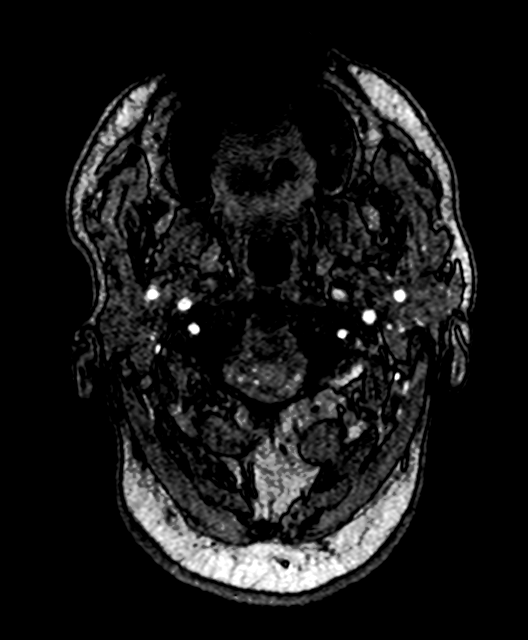
[im 3/131]
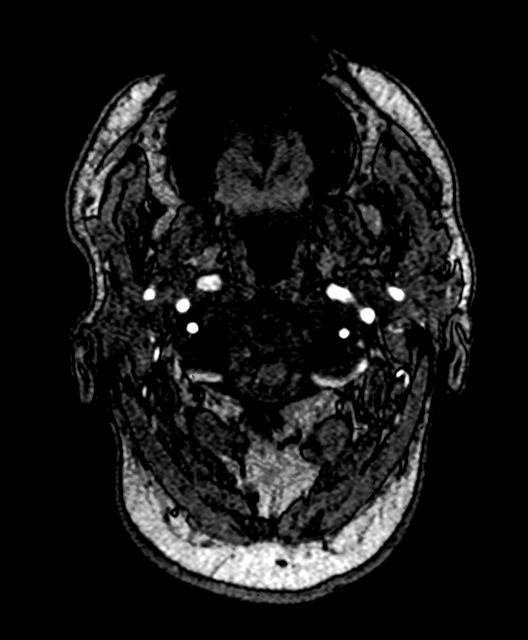
[im 9/131]
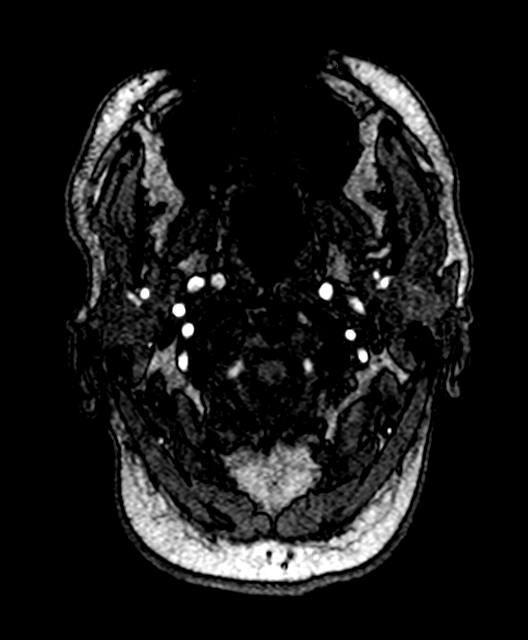
[im 23/131]
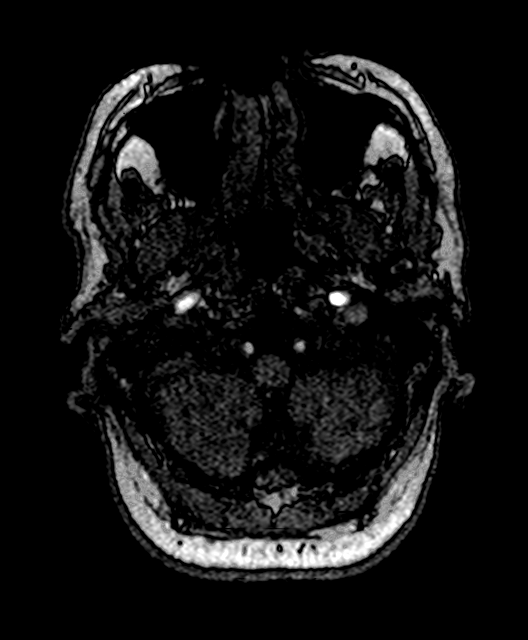
[im 25/131]
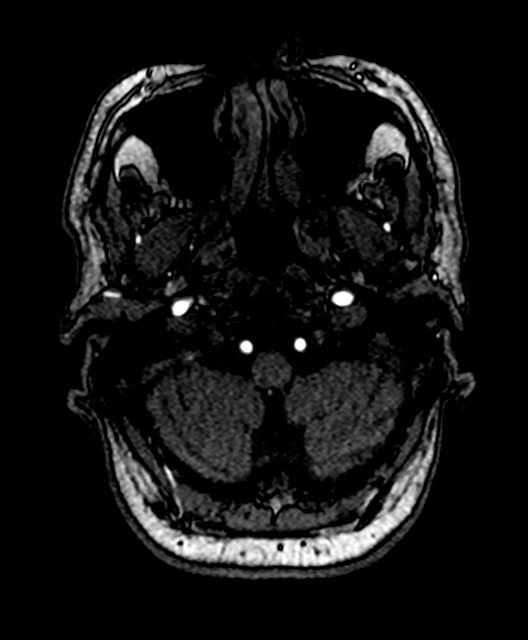
[im 42/131]
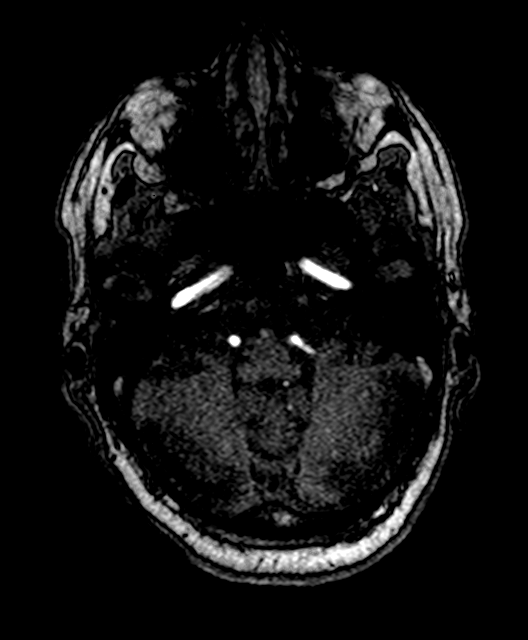
[im 59/131]
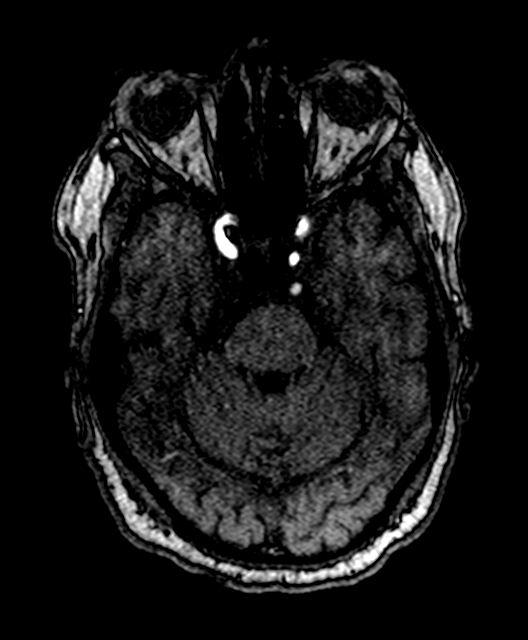
[im 67/131]
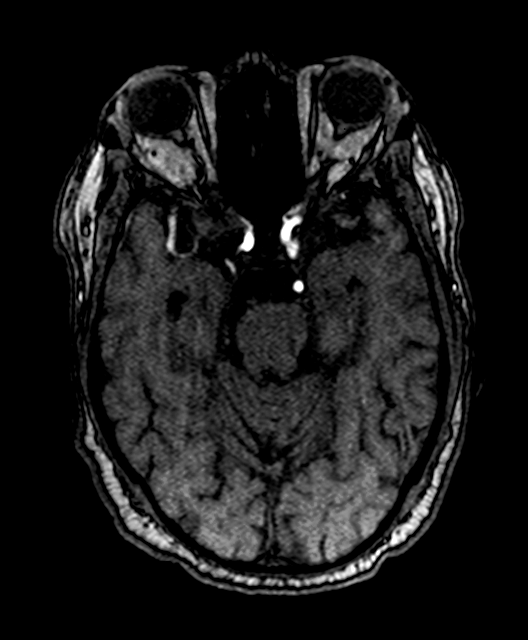
[im 75/131]
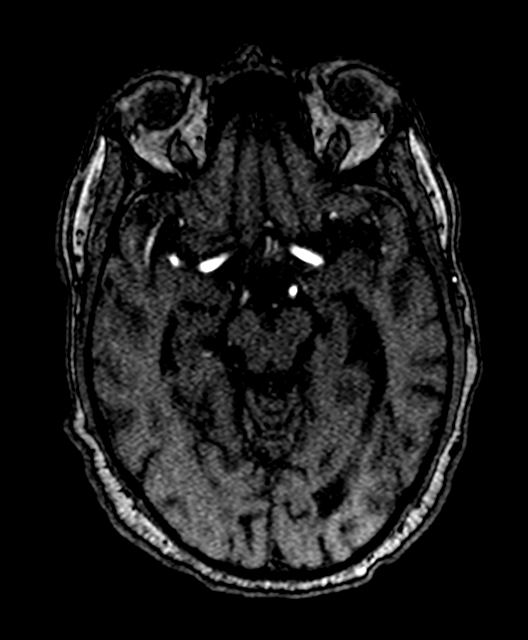
[im 92/131]
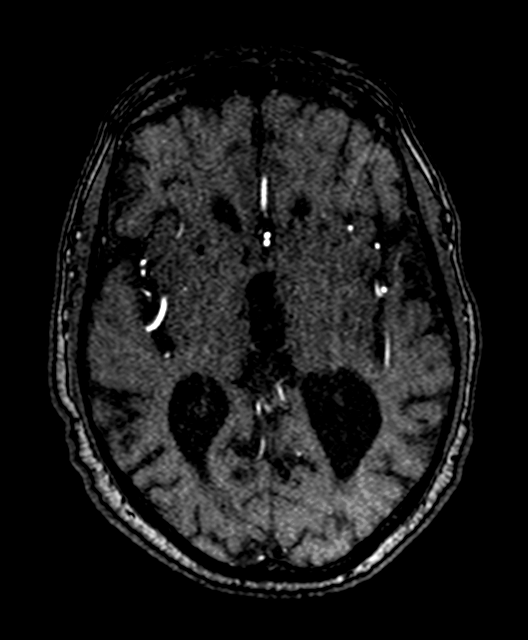
[im 108/131]
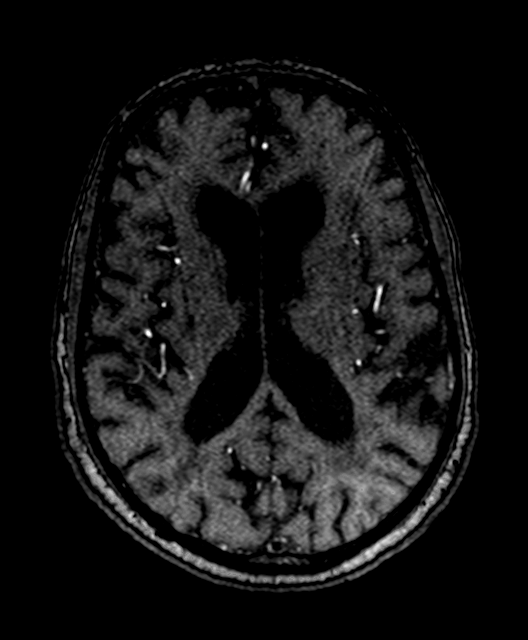
[im 111/131]
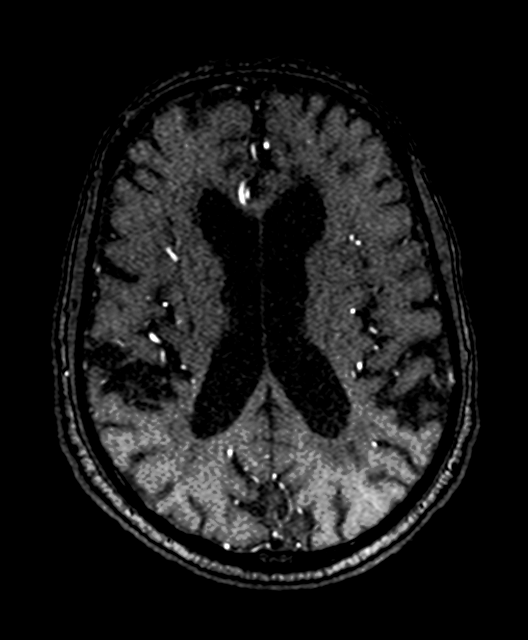
[im 125/131]
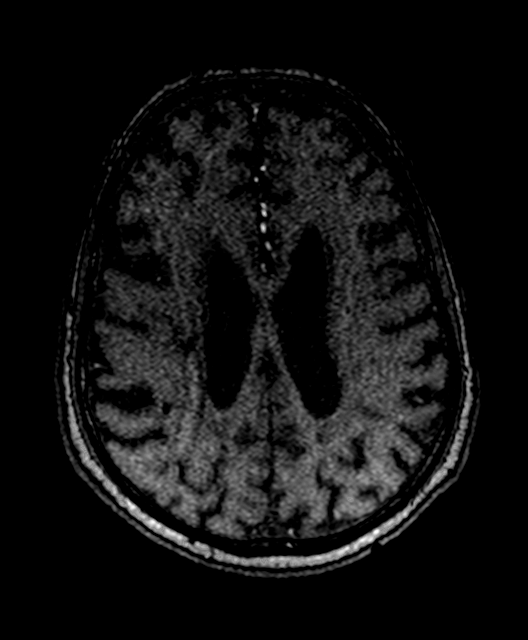

[13 of 48 positions shown; findings below may reference images not displayed]

FINDINGS: MRI HEAD FINDINGS

Brain: Diffuse prominence of the CSF containing spaces compatible
with generalized age-related cerebral atrophy. Patchy and confluent
T2/FLAIR hyperintensity within the periventricular deep white matter
both cerebral hemispheres most consistent with chronic small vessel
ischemic disease, moderate nature. Multiple scattered superimposed
remote lacunar infarcts seen involving the hemispheric cerebral
white matter bilaterally, with additional remote lacunar infarcts
involving the bilateral basal ganglia and left thalamus.

No abnormal foci of restricted diffusion to suggest acute or
subacute ischemia. Gray-white matter differentiation maintained. No
areas of remote cortical infarction. No acute intracranial
hemorrhage. Multiple scattered chronic micro hemorrhages seen
predominantly clustered about the deep gray nuclei and cerebellum,
most likely related to chronic poorly controlled hypertension.

No mass lesion, midline shift or mass effect. Of hydrocephalus. No
extra-axial fluid collection. Pituitary gland suprasellar region
normal. Midline structures intact. Ventricular prominence related to
global parenchymal volume loss

Vascular: Major intracranial vascular flow voids grossly maintained
at the skull base.

Skull and upper cervical spine: Craniocervical junction within
normal limits. Visualized upper cervical spine normal. Bone marrow
signal intensity within normal limits. No scalp soft tissue
abnormality.

Sinuses/Orbits: Globes and orbital soft tissues demonstrate no acute
finding. Mild scattered mucosal thickening noted throughout the
paranasal sinuses. No air-fluid level to suggest acute sinusitis. No
mastoid effusion. Inner ear structures grossly normal.

Other: None.

MRA HEAD FINDINGS

ANTERIOR CIRCULATION:

Examination technically limited by motion. Distal cervical segments
of the internal carotid arteries are widely patent with symmetric
antegrade flow. Petrous, cavernous, and supraclinoid segments widely
patent without flow-limiting stenosis. A1 segments patent
bilaterally. Normal anterior communicating artery complex. Anterior
cerebral arteries patent to their distal aspects without
flow-limiting stenosis. No M1 stenosis or occlusion. Normal MCA
bifurcations. Distal MCA branches well perfused and symmetric.

POSTERIOR CIRCULATION:

Evaluation of the vertebral arteries mildly limited by artifact on
this exam. Vertebral arteries patent as they course into the cranial
vault, and are grossly patent to the vertebrobasilar junction
without flow-limiting stenosis. Focal signal loss within the distal
left V4 segment, consistent with artifact as this appears patent on
corresponding MRA of the neck. Normal flow void seen within this
region on corresponding MRI. Left PICA patent. Right PICA not
visualized. Basilar tortuous with short-segment fenestration at its
proximal aspect, best seen on corresponding MRA of the neck.
Short-segment mild stenosis at the distal basilar (series 1, image
75). Basilar otherwise widely patent. Superior cerebral arteries
patent proximally. Left PCA primarily supplied via the basilar.
Hypoplastic right P1 segment with robust right posterior
communicating artery supplies the right PCA. Right PCA perfused to
its distal aspect without stenosis. Left PCA patent proximally, not
well seen distally, most consistent with artifact as this appears
patent on corresponding MRA of the neck.

No intracranial aneurysm.

MRA NECK FINDINGS

Source images reviewed.

Visualized aortic arch of normal caliber with normal 3 vessel
morphology. No hemodynamically significant stenosis seen about the
origin of the great vessels. Visualized subclavian arteries widely
patent.

Right CCA tortuous proximally but widely patent to the bifurcation
without stenosis. No significant atheromatous narrowing seen about
the right bifurcation. Right ICA tortuous but widely patent to the
skull base without stenosis, dissection or occlusion.

Left CCA tortuous proximally but widely patent to the bifurcation
without stenosis. Minor atheromatous change about the proximal left
ICA without hemodynamically significant stenosis. Left ICA tortuous
distally but widely patent to the skull base without stenosis,
dissection or occlusion.

Both vertebral arteries arise from the subclavian arteries. Origin
of the vertebral arteries not well assessed due to motion.
Visualized vertebral arteries widely patent within the neck without
stenosis, dissection or occlusion. Right vertebral artery slightly
dominant.
IMPRESSION: MRI HEAD IMPRESSION:

1. No acute intracranial infarct or other abnormality identified.
2. Moderately advanced cerebral atrophy with chronic microvascular
ischemic disease, with multiple remote lacunar infarcts involving
the hemispheric cerebral white matter and deep gray nuclei.
3. Multiple scattered chronic micro hemorrhages clustered about the
deep gray nuclei and cerebellum, most consistent with underlying
chronic hypertensive encephalopathy.

MRA HEAD IMPRESSION:

1. Negative intracranial MRA for large vessel occlusion.
2. Short-segment mild stenosis involving the distal basilar artery.
No other hemodynamically significant or proximal correctable
stenosis identified.
3. No intracranial aneurysm.

MRA NECK IMPRESSION:

1. Wide patency of both carotid artery systems within the neck. No
hemodynamically significant stenosis or other acute vascular
abnormality.
2. Wide patency of both vertebral arteries within the neck.
3. Diffuse tortuosity of the major arterial vasculature of the neck,
suggesting chronic underlying hypertension.

## 2019-08-17 MED ORDER — ACETAMINOPHEN 325 MG PO TABS: 650.0000 mg | ORAL_TABLET | Freq: Four times a day (QID) | ORAL | Status: DC | PRN

## 2019-08-17 MED ORDER — AMLODIPINE BESYLATE 5 MG PO TABS
10.0000 mg | ORAL_TABLET | Freq: Every day | ORAL | Status: DC
  Administered 2019-08-18 – 2019-08-19 (×2): 10 mg via ORAL
  Filled 2019-08-17 (×2): qty 2

## 2019-08-17 MED ORDER — ONDANSETRON HCL 4 MG PO TABS: 4.0000 mg | ORAL_TABLET | Freq: Four times a day (QID) | ORAL | Status: DC | PRN

## 2019-08-17 MED ORDER — INSULIN ASPART 100 UNIT/ML ~~LOC~~ SOLN
0.0000 [IU] | Freq: Three times a day (TID) | SUBCUTANEOUS | Status: DC
  Administered 2019-08-17: 7 [IU] via SUBCUTANEOUS
  Administered 2019-08-18: 2 [IU] via SUBCUTANEOUS
  Administered 2019-08-18 (×2): 1 [IU] via SUBCUTANEOUS
  Administered 2019-08-19: 09:00:00 2 [IU] via SUBCUTANEOUS
  Administered 2019-08-19: 3 [IU] via SUBCUTANEOUS

## 2019-08-17 MED ORDER — GADOBUTROL 1 MMOL/ML IV SOLN
7.5000 mL | Freq: Once | INTRAVENOUS | Status: AC | PRN
  Administered 2019-08-17: 7.5 mL via INTRAVENOUS

## 2019-08-17 MED ORDER — SODIUM CHLORIDE 0.9% FLUSH: 3.0000 mL | Freq: Once | INTRAVENOUS | Status: DC

## 2019-08-17 MED ORDER — ENOXAPARIN SODIUM 40 MG/0.4ML ~~LOC~~ SOLN
40.0000 mg | SUBCUTANEOUS | Status: DC
  Administered 2019-08-17 – 2019-08-18 (×2): 40 mg via SUBCUTANEOUS
  Filled 2019-08-17 (×2): qty 0.4

## 2019-08-17 MED ORDER — ATORVASTATIN CALCIUM 40 MG PO TABS
40.0000 mg | ORAL_TABLET | Freq: Every day | ORAL | Status: DC
  Administered 2019-08-18: 17:00:00 40 mg via ORAL
  Filled 2019-08-17: qty 1

## 2019-08-17 MED ORDER — ACETAMINOPHEN 650 MG RE SUPP: 650.0000 mg | Freq: Four times a day (QID) | RECTAL | Status: DC | PRN

## 2019-08-17 MED ORDER — ONDANSETRON HCL 4 MG/2ML IJ SOLN: 4.0000 mg | Freq: Four times a day (QID) | INTRAMUSCULAR | Status: DC | PRN

## 2019-08-17 MED ORDER — OLANZAPINE 5 MG PO TABS
2.5000 mg | ORAL_TABLET | Freq: Every day | ORAL | Status: DC
  Administered 2019-08-17 – 2019-08-18 (×2): 2.5 mg via ORAL
  Filled 2019-08-17 (×2): qty 1

## 2019-08-17 MED ORDER — ASPIRIN 325 MG PO TABS
325.0000 mg | ORAL_TABLET | Freq: Every day | ORAL | Status: DC
  Administered 2019-08-17 – 2019-08-19 (×3): 325 mg via ORAL
  Filled 2019-08-17 (×3): qty 1

## 2019-08-17 MED ORDER — POLYETHYLENE GLYCOL 3350 17 G PO PACK: 17.0000 g | PACK | Freq: Every day | ORAL | Status: DC | PRN

## 2019-08-17 MED ORDER — LOSARTAN POTASSIUM 50 MG PO TABS
25.0000 mg | ORAL_TABLET | Freq: Every day | ORAL | Status: DC
  Administered 2019-08-18 – 2019-08-19 (×2): 25 mg via ORAL
  Filled 2019-08-17 (×2): qty 1

## 2019-08-17 NOTE — ED Provider Notes (Signed)
[facility] EMERGENCY DEPARTMENT Provider Note   CSN: [id number] Arrival date & time: 08/17/19  1235     History   Chief Complaint Chief Complaint  Patient presents with   Weakness    HPI Robert Osborne is a 73 y.o. male with past medical history of type 2 diabetes, hypertension, bladder cancer, dementia, presenting to the emergency department from PCP office with complaint of weakness that began several weeks ago.  He states he feels generally weak and fatigued with some intermittent balance issues.  Yesterday, he woke up in the morning feeling worse than usual. Patient denies cough, fever, urinary symptoms, abdominal complaints.  He states he had some mild intermittent headache.  He denies any perceived unilateral weakness or numbness.  He reports some intermittent b/l blurry vision. His wife states she did not notice any speech difficulty or facial droop though she did notice that he is more weak than usual yesterday.  He was evaluated at his PCP office today who sent him here for stroke work-up with new CN deficits on exam.       The history is provided by the patient, the spouse and medical records.    Past Medical History:  Diagnosis Date   Bladder cancer ([place]) 08/18/2013   Cancer ([place])    papillary urethral carcinoma,low grade, non-invasive   Diabetes mellitus without complication ([place])    Erectile dysfunction 10/24/2016   Hyperlipidemia associated with type 2 diabetes mellitus ([place]) 05/17/2013   Hypertension associated with diabetes ([place])    Hypokalemia 11/27/2017   Hypomagnesemia 11/27/2017   Major frontotemporal neurocognitive disorder, probable, with behavioral disturbance ([place]) 11/27/2017   Vitamin D deficiency 05/17/2013    Patient Active Problem List   Diagnosis Date Noted   Hx of bladder cancer 07/29/2019   Abnormal stress test 08/11/2018   PVC's (premature ventricular contractions) 08/11/2018   Hypokalemia 11/27/2017   Hypomagnesemia 11/27/2017     Major frontotemporal neurocognitive disorder, probable, with behavioral disturbance ([place]) 11/27/2017   Erectile dysfunction 10/24/2016   Bladder cancer ([place]) 08/18/2013   Diabetes mellitus ([place])    Hypertension associated with diabetes ([place])    Vitamin D deficiency 05/17/2013   Hyperlipidemia associated with type 2 diabetes mellitus ([place]) 05/17/2013    Past Surgical History:  Procedure Laterality Date   COLONOSCOPY W/ POLYPECTOMY          Home Medications    Prior to Admission medications   Medication Sig Start Date End Date Taking? Authorizing Provider  atorvastatin (LIPITOR) 40 MG tablet Take 1 tablet by mouth once daily 06/15/19  Yes Hawks, [place] A, FNP  Cholecalciferol (VITAMIN D-3) 5000 UNITS TABS Take 1,000 Int'l Units by mouth daily.    Yes [provider]  metFORMIN (GLUCOPHAGE XR) 750 MG 24 hr tablet Take 2 tablets (1,500 mg total) by mouth daily with breakfast. 08/02/19 08/01/20 Yes Hawks, Christy A, FNP  vitamin B-12 (CYANOCOBALAMIN) 1000 MCG tablet Take 1,000 mcg by mouth daily.   Yes [provider]  amLODipine (NORVASC) 10 MG tablet Take 1 tablet (10 mg total) by mouth daily. 06/16/19   Sharion Balloon, FNP  blood glucose meter kit and supplies KIT Dispense based on patient and insurance preference. Use up to twice a daily as directed. (FOR ICD-10 - type 2 DM uncontrolled E11.9) 06/08/17   Cherre Robins, PharmD  blood glucose meter kit and supplies Dispense based on patient and insurance preference. Use up to four times daily as directed. (FOR ICD-10 E10.9, E11.9). 08/09/19  Hawks, Christy A, FNP  insulin degludec (TRESIBA FLEXTOUCH) 100 UNIT/ML SOPN FlexTouch Pen Inject 0.05 mLs (5 Units total) into the skin daily. 08/10/19   Sharion Balloon, FNP  losartan (COZAAR) 25 MG tablet Take 1 tablet (25 mg total) by mouth daily. 08/11/18 07/29/19  Minus Breeding, MD  ONE TOUCH ULTRA TEST test strip  06/16/17   [provider]  Jonetta Speak  LANCETS 72C [place]  06/08/17   [provider]  potassium chloride SA (K-DUR) 20 MEQ tablet Take 1 tablet (20 mEq total) by mouth daily. 08/10/19   Sharion Balloon, FNP    Family History Family History  Problem Relation Age of Onset   Heart attack Maternal Grandmother    Stroke Maternal Grandfather    Heart attack Mother 90    Social History Social History   Tobacco Use   Smoking status: Former Smoker    Quit date: 05/17/1973    Years since quitting: 46.2   Smokeless tobacco: Never Used  Substance Use Topics   Alcohol use: No   Drug use: No     Allergies   Enalapril   Review of Systems Review of Systems  All other systems reviewed and are negative.    Physical Exam Updated Vital Signs BP (!) 163/83    Pulse 60    Temp 98.3 F (36.8 C) (Oral)    Resp 15    SpO2 95%   Physical Exam Vitals signs and nursing note reviewed.  Constitutional:      Appearance: He is well-developed.  HENT:     Head: Normocephalic and atraumatic.  Eyes:     Extraocular Movements: Extraocular movements intact.     Conjunctiva/sclera: Conjunctivae normal.     Pupils: Pupils are equal, round, and reactive to light.  Cardiovascular:     Rate and Rhythm: Normal rate and regular rhythm.  Pulmonary:     Effort: Pulmonary effort is normal. No respiratory distress.     Breath sounds: Normal breath sounds.  Abdominal:     General: Bowel sounds are normal.     Palpations: Abdomen is soft.     Tenderness: There is no abdominal tenderness. There is no guarding or rebound.  Skin:    General: Skin is warm.  Neurological:     Mental Status: He is alert.     Comments: Mental Status:  Alert, oriented, thought content appropriate, able to give a coherent history. Speech fluent without evidence of aphasia. Able to follow 2 step commands without difficulty.  Cranial Nerves:  II:  Peripheral visual fields grossly normal, pupils equal, round, reactive to light III,IV, VI: left eye  appears to be slightly weaker than the left, extra-ocular motions intact bilaterally  V,VII: smile asymmetric with left droop, flattening of left nasolabial fold, facial light touch sensation equal VIII: hearing grossly normal to voice  X: uvula elevates symmetrically  XI: bilateral shoulder shrug symmetric and strong XII: midline tongue extension without fassiculations Motor:  Normal tone. 5/5 grip strength and dorsi/plantar flexion in RUE RLL and LLE, 4/5 grip strength in LUE  Sensory: grossly normal in all extremities, no neglect.  Cerebellar: normal finger-to-nose with bilateral upper extremities Gait: slow shuffling gait. Normal balance CV: distal pulses palpable throughout   Psychiatric:        Behavior: Behavior normal.      ED Treatments / Results  Labs (all labs ordered are listed, but only abnormal results are displayed) Labs Reviewed  BASIC METABOLIC PANEL - Abnormal; Notable  for the following components:      Result Value   Glucose, Bld 276 (*)    All other components within normal limits  CBG MONITORING, ED - Abnormal; Notable for the following components:   Glucose-Capillary 243 (*)    All other components within normal limits  SARS CORONAVIRUS 2 (HOSPITAL ORDER, [place] [facility])  CBC  PROTIME-INR  MAGNESIUM  URINALYSIS, ROUTINE W REFLEX MICROSCOPIC    EKG EKG Interpretation  Date/Time:  Wednesday August 17 2019 12:50:16 EDT Ventricular Rate:  67 PR Interval:  136 QRS Duration: 92 QT Interval:  406 QTC Calculation: 429 R Axis:   -30 Text Interpretation:  Normal sinus rhythm Left axis deviation Left ventricular hypertrophy Nonspecific ST and T wave abnormality Abnormal ECG No old tracing to compare Confirmed by Noemi Chapel [phone]) on 08/17/2019 2:46:15 PM Also confirmed by Noemi Chapel [phone])  on 08/17/2019 2:46:39 PM   Radiology Dg Chest 2 View  Result Date: 08/17/2019 CLINICAL DATA:  Per Triage Note: Patient reports  progressive weakness that started several days ago. States he is having difficulty walking. Sent by Josie Saunders for eval for possible stroke.generalized weakness EXAM: CHEST - 2 VIEW COMPARISON:  01/05/2017 FINDINGS: Normal cardiac silhouette with ectatic aorta. Lungs are mildly hyperinflated. No effusion, infiltrate pneumothorax. Remote RIGHT rib fracture. IMPRESSION: No acute cardiopulmonary process. Electronically Signed   By: Suzy Bouchard M.D.   On: 08/17/2019 14:55   Ct Head Wo Contrast  Result Date: 08/17/2019 CLINICAL DATA:  Progressive weakness developing over the past several days. EXAM: CT HEAD WITHOUT CONTRAST TECHNIQUE: Contiguous axial images were obtained from the base of the skull through the vertex without intravenous contrast. COMPARISON:  Head CT scan 11/20/2017. FINDINGS: Brain: No evidence of acute infarction, hemorrhage, hydrocephalus, extra-axial collection or mass lesion/mass effect. Atrophy and chronic microvascular ischemic change noted. Vascular: No hyperdense vessel or unexpected calcification. Skull: Intact.  No focal lesion. Sinuses/Orbits: Negative. Other: None. IMPRESSION: No acute abnormality. Atrophy and chronic microvascular ischemic change. Electronically Signed   By: Inge Rise M.D.   On: 08/17/2019 14:26    Procedures Procedures (including critical care time)  Medications Ordered in ED Medications  sodium chloride flush (NS) 0.9 % injection 3 mL (3 mLs Intravenous Not Given 08/17/19 1302)     Initial Impression / Assessment and Plan / ED Course  I have reviewed the triage vital signs and the nursing notes.  Pertinent labs & imaging results that were available during my care of the patient were reviewed by me and considered in my medical decision making (see chart for details).  Clinical Course as of Aug 17 1515  Wed Aug 17, 2019  1504 Pointed out to his wife that his face looks asymmetrical with flattening of the nasolabial fold and asymmetric  smile.  His wife states that is not normal for him and agrees with new asymmetry   [JR]    Clinical Course User Index [JR] Sidhant Helderman, [place] N, PA-C       Patient presenting from PCP with concern for stroke.  Patient reports generalized progressive weakness over the last multiple weeks, however yesterday felt worse upon waking in the morning.  His wife did not notice a facial droop or slurred speech though that was noted today by PCP office and again on my evaluation.  He has slightly asymmetric smile with left-sided droop, some flattening of the left nasolabial fold and weakness to left eye when closed.  He also has some slight  decrease in left upper extremity grip strength compared to the right.  Unsure of last known normal, as patient's wife did not notice the asymmetry until pointed out today in the ED.  Patient did note that he woke up feeling bad yesterday morning.  Suspected last known normal may be sometime Monday night.  Denies any infectious symptoms as cause of patient's generalized weakness.  Labs are overall reassuring.  UA pending.  Chest x-ray is negative for infiltrate.  CT scan of the head is negative for signs of acute stroke, however given patient's neuro deficits I believe he needs further stroke work-up.  For this reason he will be admitted to the hospital service for stroke work-up.  Patient discussed with Dr. Sabra Heck, who agrees with care plan for admission.  The patient appears reasonably stabilized for admission considering the current resources, flow, and capabilities available in the ED at this time, and I doubt any other [facility] requiring further screening and/or treatment in the ED prior to admission.  Final Clinical Impressions(s) / ED Diagnoses   Final diagnoses:  Neurological deficit present  Generalized weakness    ED Discharge Orders    None       Stetson Pelaez, [place] N, PA-C 08/17/19 1552    Noemi Chapel, MD 08/18/19 [phone]

## 2019-08-17 NOTE — ED Notes (Signed)
Pt in MRI.

## 2019-08-17 NOTE — Progress Notes (Signed)
Subjective: CC: abnormal gait/ behavior PCP: Sharion Balloon, FNP Robert Osborne is a 73 y.o. male presenting to clinic today for:  Patient here as a walk-in for abnormal gait behavior.  He is brought today by his wife who notes that yesterday he started acting abnormally.  She states that he was driving up and down the road "looking for a Dealer".  She denies any abnormal speech.  She does feel that he is walking slower and shuffling his gait more.  No falls.  He has not complained of a headache.  His medical history significant for uncontrolled type 2 diabetes with last A1c of greater than 14.  He has history of hyperlipidemia and hypertension.  He was also previously diagnosed with bladder cancer and major frontotemporal neurocognitive disorder.   ROS: Per HPI  Allergies  Allergen Reactions  . Enalapril Cough   Past Medical History:  Diagnosis Date  . Bladder cancer ([place]) 08/18/2013  . Cancer [facility])    papillary urethral carcinoma,low grade, non-invasive  . Diabetes mellitus without complication ([place])   . Erectile dysfunction 10/24/2016  . Hyperlipidemia associated with type 2 diabetes mellitus ([place]) 05/17/2013  . Hypertension associated with diabetes ([place])   . Hypokalemia 11/27/2017  . Hypomagnesemia 11/27/2017  . Major frontotemporal neurocognitive disorder, probable, with behavioral disturbance ([place]) 11/27/2017  . Vitamin D deficiency 05/17/2013    Current Outpatient Medications:  .  amLODipine (NORVASC) 10 MG tablet, Take 1 tablet (10 mg total) by mouth daily., Disp: 90 tablet, Rfl: 1 .  atorvastatin (LIPITOR) 40 MG tablet, Take 1 tablet by mouth once daily, Disp: 90 tablet, Rfl: 0 .  blood glucose meter kit and supplies KIT, Dispense based on patient and insurance preference. Use up to twice a daily as directed. (FOR ICD-10 - type 2 DM uncontrolled E11.9), Disp: 1 each, Rfl: 0 .  blood glucose meter kit and supplies, Dispense based on patient and insurance preference.  Use up to four times daily as directed. (FOR ICD-10 E10.9, E11.9)., Disp: 1 each, Rfl: 0 .  Cholecalciferol (VITAMIN D-3) 5000 UNITS TABS, Take 1,000 Int'l Units by mouth daily. , Disp: , Rfl:  .  insulin degludec (TRESIBA FLEXTOUCH) 100 UNIT/ML SOPN FlexTouch Pen, Inject 0.05 mLs (5 Units total) into the skin daily., Disp: 1 pen, Rfl: 2 .  losartan (COZAAR) 25 MG tablet, Take 1 tablet (25 mg total) by mouth daily., Disp: 90 tablet, Rfl: 3 .  metFORMIN (GLUCOPHAGE XR) 750 MG 24 hr tablet, Take 2 tablets (1,500 mg total) by mouth daily with breakfast., Disp: 180 tablet, Rfl: 1 .  ONE TOUCH ULTRA TEST test strip, , Disp: , Rfl:  .  ONETOUCH DELICA LANCETS 08M MISC, , Disp: , Rfl:  .  potassium chloride SA (K-DUR) 20 MEQ tablet, Take 1 tablet (20 mEq total) by mouth daily., Disp: 30 tablet, Rfl: 1 .  vitamin B-12 (CYANOCOBALAMIN) 1000 MCG tablet, Take 1,000 mcg by mouth daily., Disp: , Rfl:  Social History   Socioeconomic History  . Marital status: Married    Spouse name: Not on file  . Number of children: 3  . Years of education: Not on file  . Highest education level: Not on file  Occupational History  . Not on file  Social Needs  . Financial resource strain: Not on file  . Food insecurity    Worry: Not on file    Inability: Not on file  . Transportation needs    Medical: Not on file  Non-medical: Not on file  Tobacco Use  . Smoking status: Former Smoker    Quit date: 05/17/1973    Years since quitting: 46.2  . Smokeless tobacco: Never Used  Substance and Sexual Activity  . Alcohol use: No  . Drug use: No  . Sexual activity: Not on file  Lifestyle  . Physical activity    Days per week: Not on file    Minutes per session: Not on file  . Stress: Not on file  Relationships  . Social Herbalist on phone: Not on file    Gets together: Not on file    Attends religious service: Not on file    Active member of club or organization: Not on file    Attends meetings of  clubs or organizations: Not on file    Relationship status: Not on file  . Intimate partner violence    Fear of current or ex partner: Not on file    Emotionally abused: Not on file    Physically abused: Not on file    Forced sexual activity: Not on file  Other Topics Concern  . Not on file  Social History Narrative   Still works as Development worker, international aid.      Family History  Problem Relation Age of Onset  . Heart attack Maternal Grandmother   . Stroke Maternal Grandfather   . Heart attack Mother 67    Objective: Office vital signs reviewed. BP (!) 145/80   Pulse 70   Temp 98.5 F (36.9 C)   SpO2 98%   Physical Examination:  General: Awake, alert, No acute distress HEENT: He has left-sided facial droop and nasolabial flattening.    Eyes: PERRLA, extraocular membranes intact, sclera white Cardio: regular rate Pulm: normal work of breathing on room air Extremities: warm, well perfused, No edema, cyanosis or clubbing; +2 pulses bilaterally MSK: shuffled/ unsteady gait  Neuro: 4/5 UE and LE Strength and light touch sensation grossly intact, he has facial flattening/droop as above.  He has decreased strength with left shoulder raise.  He has slight dysmetria on the left with finger-to-nose testing.  Cranial nerves II through XII otherwise grossly intact.  Alert and oriented x3. Psych: Mood stable.  Affect flat.  Assessment/ Plan: 73 y.o. male   1. Stroke-like symptom Physical exam is very concerning for acute stroke.  Last known well was Monday.  I have sent them immediately to the emergency department given hemodynamic stability.  Will likely need imaging and stroke work-up.  We discussed that uncontrolled diabetes most certainly would contribute to strokes and I advised that they keep close follow-up with PCP for ongoing management of this.  The triage nurse at [facility] emergency department will be contacted and informed of concerns.  2. Uncontrolled type 2 diabetes mellitus with  hyperglycemia (HCC) Blood sugar was greater than 300 during today's visit.   No orders of the defined types were placed in this encounter.  No orders of the defined types were placed in this encounter.    Robert Norlander, DO [place] [phone]

## 2019-08-17 NOTE — ED Triage Notes (Signed)
EKG completed in triage.

## 2019-08-17 NOTE — Patient Instructions (Addendum)
I am very concerned about a stroke.   He has left sided facial drooping and slight dysmetria on the left today. I have recommended he seek immediate medical attention in the emergency department. We are calling down to inform of your arrival.  Your sugar is very high today. It was over 300 when we checked it here in office.

## 2019-08-17 NOTE — ED Notes (Signed)
Pt aware a urine specimen is needed. Urinal left at bedside. Pt will notify staff when one can be obtained. 

## 2019-08-17 NOTE — ED Triage Notes (Signed)
Patient reports progressive weakness that started several days ago. States he is having difficulty walking. Sent by Josie Saunders for eval for possible stroke.

## 2019-08-17 NOTE — H&P (Addendum)
History and Physical    DUANE EARNSHAW BJY:[id number] DOB: 1946-05-05 DOA: 08/17/2019  PCP: Sharion Balloon, FNP   Patient coming from: Home  I have personally briefly reviewed patient's old medical records in [place]  Chief Complaint: generalized weakness, Left facial droop.  HPI: Robert Osborne is a 73 y.o. male with medical history significant for hypertension, diabetes mellitus, bladder cancer.  Patient presented to the ED with complaints of generalized weakness over the past several weeks, but over the past 2 days weakness has worsened, with difficulty walking.  Per notes patient spouse reported shuffling of his feet.  Patient saw his primary care provider with concern for stroke was sent to the ED. On further questioning patient also reports intermittent chest pain worse with activity-especially at work (he works in [employer]ical [employer] ) and relieved by rest.  His pain is left-sided, nonradiating, and transiently associated with difficulty catching his breath.  He is unable to describe the character of the pain. Chest pain usually lasts 15 to 20 minutes.  He had chest pains earlier today.  Symptoms ongoing over the past several months to a year.  Denies history of heart attacks.  Quit smoking cigarettes about 20 years ago.  ED Course: Noted in the ED, was an obvious left facial droop, which spouse came to recognize in th ED but had not recognized previously.  Blood pressure systolic 086V to 784O.  Unremarkable BMP, CBC,.  Two-view chest x-ray clear.  Head CT negative for acute abnormality.  Review of Systems: As per HPI all other systems reviewed and negative.  Past Medical History:  Diagnosis Date  . Bladder cancer ([place]) 08/18/2013  . Cancer [facility])    papillary urethral carcinoma,low grade, non-invasive  . Diabetes mellitus without complication ([place])   . Erectile dysfunction 10/24/2016  . Hyperlipidemia associated with type 2 diabetes mellitus ([place]) 05/17/2013  . Hypertension  associated with diabetes ([place])   . Hypokalemia 11/27/2017  . Hypomagnesemia 11/27/2017  . Major frontotemporal neurocognitive disorder, probable, with behavioral disturbance ([place]) 11/27/2017  . Vitamin D deficiency 05/17/2013    Past Surgical History:  Procedure Laterality Date  . COLONOSCOPY W/ POLYPECTOMY       reports that he quit smoking about 46 years ago. He has never used smokeless tobacco. He reports that he does not drink alcohol or use drugs.  Allergies  Allergen Reactions  . Enalapril Cough    Family History  Problem Relation Age of Onset  . Heart attack Maternal Grandmother   . Stroke Maternal Grandfather   . Heart attack Mother 85    Prior to Admission medications   Medication Sig Start Date End Date Taking? Authorizing Provider  amLODipine (NORVASC) 10 MG tablet Take 1 tablet (10 mg total) by mouth daily. 06/16/19  Yes Hawks, Alyse Low A, FNP  atorvastatin (LIPITOR) 40 MG tablet Take 1 tablet by mouth once daily 06/15/19  Yes Hawks, [place] A, FNP  Cholecalciferol (VITAMIN D-3) 5000 UNITS TABS Take 1,000 Int'l Units by mouth daily.    Yes [provider]  losartan (COZAAR) 25 MG tablet Take 1 tablet (25 mg total) by mouth daily. 08/11/18 08/17/19 Yes Minus Breeding, MD  metFORMIN (GLUCOPHAGE XR) 750 MG 24 hr tablet Take 2 tablets (1,500 mg total) by mouth daily with breakfast. 08/02/19 08/01/20 Yes Hawks, Christy A, FNP  OLANZapine (ZYPREXA) 2.5 MG tablet Take 2.5 mg by mouth at bedtime.   Yes [provider]  vitamin B-12 (CYANOCOBALAMIN) 1000 MCG tablet Take 1,000 mcg  by mouth daily.   Yes [provider]  blood glucose meter kit and supplies KIT Dispense based on patient and insurance preference. Use up to twice a daily as directed. (FOR ICD-10 - type 2 DM uncontrolled E11.9) Patient not taking: Reported on 08/17/2019 06/08/17   Cherre Robins, PharmD  blood glucose meter kit and supplies Dispense based on patient and insurance preference. Use up to  four times daily as directed. (FOR ICD-10 E10.9, E11.9). Patient not taking: Reported on 08/17/2019 08/09/19   Evelina Dun A, FNP  insulin degludec (TRESIBA FLEXTOUCH) 100 UNIT/ML SOPN FlexTouch Pen Inject 0.05 mLs (5 Units total) into the skin daily. Patient not taking: Reported on 08/17/2019 08/10/19   Evelina Dun A, FNP  potassium chloride SA (K-DUR) 20 MEQ tablet Take 1 tablet (20 mEq total) by mouth daily. Patient not taking: Reported on 08/17/2019 08/10/19   Sharion Balloon, FNP    Physical Exam: Vitals:   08/17/19 1530 08/17/19 1600 08/17/19 1630 08/17/19 1824  BP: (!) 154/83 (!) 153/81 (!) 153/81 (!) 154/83  Pulse: 60 (!) 54 62 65  Resp: '20 18 20 18  '$ Temp:    98.4 F (36.9 C)  TempSrc:    Oral  SpO2: 99% 96% 98% 98%  Weight:    68.4 kg  Height:    '5\' 7"'$  (1.702 m)    Constitutional: NAD, calm, comfortable Vitals:   08/17/19 1530 08/17/19 1600 08/17/19 1630 08/17/19 1824  BP: (!) 154/83 (!) 153/81 (!) 153/81 (!) 154/83  Pulse: 60 (!) 54 62 65  Resp: '20 18 20 18  '$ Temp:    98.4 F (36.9 C)  TempSrc:    Oral  SpO2: 99% 96% 98% 98%  Weight:    68.4 kg  Height:    '5\' 7"'$  (1.702 m)   Eyes: PERRL, lids and conjunctivae normal ENMT: Mucous membranes are moist. Posterior pharynx clear of any exudate or lesions Neck: normal, supple, no masses, no thyromegaly Respiratory: clear to auscultation bilaterally, no wheezing, no crackles. Normal respiratory effort. No accessory muscle use.  Cardiovascular: Regular rate and rhythm, no murmurs / rubs / gallops. No extremity edema. 2+ pedal pulses.   Abdomen: no tenderness, no masses palpated. No hepatosplenomegaly. Bowel sounds positive.  Musculoskeletal: no clubbing / cyanosis. No joint deformity upper and lower extremities. Good ROM, no contractures.  Skin: no rashes, lesions, ulcers. No induration Neurologic: Vision appears normal bilaterally, left facial droop, otherwise no other cranial nerve deficits appreciated,  sensation intact.   Strength 4+5 left upper extremity, 5/5 bilateral lower extremity.  Normal finger-to-nose test bilaterally. Psychiatric: Normal judgment and insight. Alert and oriented x 3. Normal mood.   Labs on Admission: I have personally reviewed following labs and imaging studies  CBC: Recent Labs  Lab 08/17/19 1317  WBC 5.7  HGB 15.0  HCT 44.4  MCV 87.9  PLT 270   Basic Metabolic Panel: Recent Labs  Lab 08/17/19 1317  NA 141  K 4.0  CL 104  CO2 27  GLUCOSE 276*  BUN 17  CREATININE 1.03  CALCIUM 9.1  MG 1.9   Coagulation Profile: Recent Labs  Lab 08/17/19 1317  INR 1.0   CBG: Recent Labs  Lab 08/17/19 1258 08/17/19 1850  GLUCAP 243* 301*    Radiological Exams on Admission: Dg Chest 2 View  Result Date: 08/17/2019 CLINICAL DATA:  Per Triage Note: Patient reports progressive weakness that started several days ago. States he is having difficulty walking. Sent by Josie Saunders for eval  for possible stroke.generalized weakness EXAM: CHEST - 2 VIEW COMPARISON:  01/05/2017 FINDINGS: Normal cardiac silhouette with ectatic aorta. Lungs are mildly hyperinflated. No effusion, infiltrate pneumothorax. Remote RIGHT rib fracture. IMPRESSION: No acute cardiopulmonary process. Electronically Signed   By: Suzy Bouchard M.D.   On: 08/17/2019 14:55   Ct Head Wo Contrast  Result Date: 08/17/2019 CLINICAL DATA:  Progressive weakness developing over the past several days. EXAM: CT HEAD WITHOUT CONTRAST TECHNIQUE: Contiguous axial images were obtained from the base of the skull through the vertex without intravenous contrast. COMPARISON:  Head CT scan 11/20/2017. FINDINGS: Brain: No evidence of acute infarction, hemorrhage, hydrocephalus, extra-axial collection or mass lesion/mass effect. Atrophy and chronic microvascular ischemic change noted. Vascular: No hyperdense vessel or unexpected calcification. Skull: Intact.  No focal lesion. Sinuses/Orbits: Negative. Other: None. IMPRESSION: No acute  abnormality. Atrophy and chronic microvascular ischemic change. Electronically Signed   By: Inge Rise M.D.   On: 08/17/2019 14:26    EKG: Independently reviewed.  Sinus rhythm, LVH.  Specific T wave abnormalities 3 aVF V4 through V6.  No old EKG to compare.  Assessment/Plan Principal Problem:   Facial droop  Left facial droop-with mild left left upper extremity weakness, difficulty walking for 2 days duration.  Outside TPA window.  Initial complaints was generalized weakness.  CT negative for acute abnormality.  Patient is on '81mg'$  aspirin, but reports intermittent compliance, on average 3 times weekly.  History of dementia with behavioral disturbance.  History of dementia with behavioral disturbance. -MRI brain -MRA brain and neck -Neurology consult -Lipid panel, hemoglobin A1c a.m. -PT OT evaluation -Echocardiogram -Aspirin 325 daily -Resume home statin  Chest pain- typical.  EKG with nonspecific T wave abnormalities in lead III, aVF, V4 through V6- no old to compare. Exercise tolerance test 2017 reported ST depression, test was stopped due to difficulty breathing.  No anginal symptoms during stress test.  Patient saw Dr. Percival Spanish 07/2018, stress test read as intermediate risk, apparently unable to schedule perfusion study for patient. -Cardiology consult - Hs Trop - 3, trend -Follow-up echocardiogram -N.p.o. midnight  Bladder cancer- hx of low-grade noninvasive papillary ureteral cancer 2014.  Patient reports generalized weakness and weight loss.  -Follow-up as outpatient  Diabetes mellitus-random glucose 276.  Reports poor compliance with metformin (1500 mg twice daily) due to diarrhea.  Patient has not started taking Tresiba-5u, started as outpatient. - SSI -Hold home metformin.  Hypertension-elevated. - resume home antiHTNs in A.m-Norvasc, losartan.  Dementia with behavioral disturbance -Resume home olanzapine    DVT prophylaxis: Lovenox Code Status: Full Family  Communication: None at bedside Disposition Plan: 1- 2 days Consults called: cardiology, neurology Admission status: Obs, tele    Bethena Roys MD [facility]  08/17/2019, 7:48 PM

## 2019-08-17 NOTE — Addendum Note (Signed)
Addended byFaylene Million C on: 08/17/2019 03:00 PM   Modules accepted: Orders

## 2019-08-17 NOTE — ED Notes (Signed)
Unable to obtain labs from IV stick. Lab will draw. 

## 2019-08-18 ENCOUNTER — Encounter (HOSPITAL_COMMUNITY): Payer: Self-pay | Admitting: Student

## 2019-08-18 ENCOUNTER — Observation Stay (HOSPITAL_BASED_OUTPATIENT_CLINIC_OR_DEPARTMENT_OTHER): Payer: Medicare HMO

## 2019-08-18 DIAGNOSIS — I619 Nontraumatic intracerebral hemorrhage, unspecified: Secondary | ICD-10-CM | POA: Diagnosis not present

## 2019-08-18 DIAGNOSIS — R5383 Other fatigue: Secondary | ICD-10-CM

## 2019-08-18 DIAGNOSIS — R0789 Other chest pain: Secondary | ICD-10-CM

## 2019-08-18 DIAGNOSIS — R269 Unspecified abnormalities of gait and mobility: Secondary | ICD-10-CM | POA: Diagnosis not present

## 2019-08-18 DIAGNOSIS — R29818 Other symptoms and signs involving the nervous system: Secondary | ICD-10-CM | POA: Diagnosis not present

## 2019-08-18 DIAGNOSIS — Z8673 Personal history of transient ischemic attack (TIA), and cerebral infarction without residual deficits: Secondary | ICD-10-CM | POA: Diagnosis not present

## 2019-08-18 DIAGNOSIS — E119 Type 2 diabetes mellitus without complications: Secondary | ICD-10-CM

## 2019-08-18 DIAGNOSIS — R079 Chest pain, unspecified: Secondary | ICD-10-CM | POA: Diagnosis not present

## 2019-08-18 DIAGNOSIS — E782 Mixed hyperlipidemia: Secondary | ICD-10-CM

## 2019-08-18 DIAGNOSIS — I10 Essential (primary) hypertension: Secondary | ICD-10-CM

## 2019-08-18 DIAGNOSIS — G459 Transient cerebral ischemic attack, unspecified: Secondary | ICD-10-CM | POA: Diagnosis not present

## 2019-08-18 DIAGNOSIS — R29810 Facial weakness: Secondary | ICD-10-CM | POA: Diagnosis not present

## 2019-08-18 DIAGNOSIS — R531 Weakness: Secondary | ICD-10-CM | POA: Diagnosis not present

## 2019-08-18 LAB — ECHOCARDIOGRAM COMPLETE
Height: 67 in
Weight: 2412.71 oz

## 2019-08-18 LAB — HEMOGLOBIN A1C
Hgb A1c MFr Bld: 11.4 % — ABNORMAL HIGH (ref 4.8–5.6)
Mean Plasma Glucose: 280.48 mg/dL

## 2019-08-18 LAB — GLUCOSE, CAPILLARY
Glucose-Capillary: 148 mg/dL — ABNORMAL HIGH (ref 70–99)
Glucose-Capillary: 148 mg/dL — ABNORMAL HIGH (ref 70–99)
Glucose-Capillary: 159 mg/dL — ABNORMAL HIGH (ref 70–99)
Glucose-Capillary: 178 mg/dL — ABNORMAL HIGH (ref 70–99)

## 2019-08-18 LAB — LIPID PANEL
Cholesterol: 130 mg/dL (ref 0–200)
HDL: 39 mg/dL — ABNORMAL LOW (ref >40)
LDL Cholesterol: 76 mg/dL (ref 0–99)
Total CHOL/HDL Ratio: 3.3 RATIO
Triglycerides: 76 mg/dL (ref <150)
VLDL: 15 mg/dL (ref 0–40)

## 2019-08-18 LAB — VITAMIN B12: Vitamin B-12: 897 pg/mL (ref 180–914)

## 2019-08-18 NOTE — Consult Note (Addendum)
[place] A. Merlene Laughter, MD     [email or web address]          Robert Osborne is an 73 y.o. male.   ASSESSMENT/PLAN: 1.  Subacute gait impairment with the acute worsening of unclear etiology: The patient does seem to have evidence of subtle myelopathy with brisk reflexes and slightly increased tone of the legs.  Additionally, there is extensive white matter microvascular ischemic changes, microhemorrhages and multiple lacunar infarcts.  Furthermore, the patient likely has significant diabetic neuropathy.  Given the evidence of myelopathy on physical examination, MRI of the cervical spine is recommended.  Additional labs are also obtained.  Physical and occupational therapies are recommended.  Patient is encouraged to be compliant with his diabetic medications.  I agree with the use of aspirin for secondary stroke prevention.  Continue with blood pressure and blood sugar control.  2.  Extensive vascular disease involving the brain with the multiple lacunar infarcts, severe chronic microvascular changes and multiple microhemorrhages are noted.  This increases the risk for cognitive impairment and gait impairment.    This is a 73 year old black male who presents with worsening gait impairment over the last several weeks.  It appears that his symptoms got significantly worse a few days ago which resulted in the patient seeking medical attention.  The patient has had high blood sugars on presentation and reports that he has not been taking his metformin for unclear reasons.  Hemoglobin A1c has been elevated.  The patient does not report having clear focal weakness however.  He denies any loss of consciousness, dysarthria or dysphasia.  He is noted to have flattening of the nasolabial fold on the left side which he has noticed a couple of days ago.  He tells me he thinks this is due to having dental work done on the right side.  He thinks that side is somewhat swollen relative to the left  side.  The patient does not report chest pain, shortness of breath or dyspnea.  He does not report headaches and the review of systems otherwise negative.    GENERAL: This a very pleasant male who is in no acute distress.  HEENT: No trauma appreciated.  The neck is supple.  ABDOMEN: soft  EXTREMITIES: No edema   BACK: This is normal.  SKIN: Normal by inspection.    MENTAL STATUS: Alert and oriented. Speech, language and cognition are generally intact. Judgment and insight normal.   CRANIAL NERVES: Pupils are equal, round and reactive to light and accomodation; extra ocular movements are full, there is no significant nystagmus; visual fields are full; upper and lower facial muscles are normal in strength and symmetric, there is no flattening of the nasolabial folds; tongue is midline; uvula is midline; shoulder elevation is normal.  MOTOR: There may be slightly increased tone of the legs but bulk and strength are normal.  The upper extremity shows normal tone, bulk and strength.  COORDINATION: Left finger to nose is normal, right finger to nose is normal, No rest tremor; no intention tremor; no postural tremor; no bradykinesia.  REFLEXES: Deep tendon reflexes are symmetrical and normal involving the upper extremities but clearly brisk in the legs 3+.   SENSATION: Normal to light touch, temperature, and pain.  GAIT: This is somewhat wide-based but no evidence of spasticity is noted on walking.     Blood pressure 136/74, pulse 66, temperature 98.7 F (37.1 C), temperature source Oral, resp. rate 18, height _0  (1.702 m), weight 68.4 kg, SpO2  97 %.  Past Medical History:  Diagnosis Date  . Bladder cancer ([place]) 08/18/2013  . [facility])    papillary urethral carcinoma,low grade, non-invasive  . Diabetes mellitus without complication ([place])   . Erectile dysfunction 10/24/2016  . Hyperlipidemia associated with type 2 diabetes mellitus ([place]) 05/17/2013  . Hypertension associated  with diabetes ([place])   . Hypokalemia 11/27/2017  . Hypomagnesemia 11/27/2017  . Major frontotemporal neurocognitive disorder, probable, with behavioral disturbance ([place]) 11/27/2017  . Vitamin D deficiency 05/17/2013    Past Surgical History:  Procedure Laterality Date  . COLONOSCOPY W/ POLYPECTOMY      Family History  Problem Relation Age of Onset  . Heart attack Maternal Grandmother   . Stroke Maternal Grandfather   . Heart attack Mother 49    Social History:  reports that he quit smoking about 46 years ago. He has never used smokeless tobacco. He reports that he does not drink alcohol or use drugs.  Allergies:  Allergies  Allergen Reactions  . Enalapril Cough    Medications: Prior to Admission medications   Medication Sig Start Date End Date Taking? Authorizing Provider  amLODipine (NORVASC) 10 MG tablet Take 1 tablet (10 mg total) by mouth daily. 06/16/19  Yes Hawks, Alyse Low A, FNP  atorvastatin (LIPITOR) 40 MG tablet Take 1 tablet by mouth once daily 06/15/19  Yes Hawks, Ocean Breeze A, FNP  Cholecalciferol (VITAMIN D-3) 5000 UNITS TABS Take 1,000 Int'l Units by mouth daily.    Yes [provider]  losartan (COZAAR) 25 MG tablet Take 1 tablet (25 mg total) by mouth daily. 08/11/18 08/17/19 Yes Minus Breeding, MD  metFORMIN (GLUCOPHAGE XR) 750 MG 24 hr tablet Take 2 tablets (1,500 mg total) by mouth daily with breakfast. 08/02/19 08/01/20 Yes Hawks, Christy A, FNP  OLANZapine (ZYPREXA) 2.5 MG tablet Take 2.5 mg by mouth at bedtime.   Yes [provider]  vitamin B-12 (CYANOCOBALAMIN) 1000 MCG tablet Take 1,000 mcg by mouth daily.   Yes [provider]  blood glucose meter kit and supplies KIT Dispense based on patient and insurance preference. Use up to twice a daily as directed. (FOR ICD-10 - type 2 DM uncontrolled E11.9) Patient not taking: Reported on 08/17/2019 06/08/17   Cherre Robins, PharmD  blood glucose meter kit and supplies Dispense based on patient and  insurance preference. Use up to four times daily as directed. (FOR ICD-10 E10.9, E11.9). Patient not taking: Reported on 08/17/2019 08/09/19   Evelina Dun A, FNP  insulin degludec (TRESIBA FLEXTOUCH) 100 UNIT/ML SOPN FlexTouch Pen Inject 0.05 mLs (5 Units total) into the skin daily. Patient not taking: Reported on 08/17/2019 08/10/19   Evelina Dun A, FNP  potassium chloride SA (K-DUR) 20 MEQ tablet Take 1 tablet (20 mEq total) by mouth daily. Patient not taking: Reported on 08/17/2019 08/10/19   Evelina Dun A, FNP    Scheduled Meds: . amLODipine  10 mg Oral Daily  . aspirin  325 mg Oral Daily  . atorvastatin  40 mg Oral q1800  . enoxaparin (LOVENOX) injection  40 mg Subcutaneous Q24H  . insulin aspart  0-9 Units Subcutaneous TID WC  . losartan  25 mg Oral Daily  . OLANZapine  2.5 mg Oral QHS   Continuous Infusions: PRN Meds:.acetaminophen **OR** acetaminophen, ondansetron **OR** ondansetron (ZOFRAN) IV, polyethylene glycol     Results for orders placed or performed during the hospital encounter of 08/17/19 (from the past 48 hour(s))  CBG monitoring, ED     Status: Abnormal  Collection Time: 08/17/19 12:58 PM  Result Value Ref Range   Glucose-Capillary 243 (H) 70 - 99 mg/dL  Basic metabolic panel     Status: Abnormal   Collection Time: 08/17/19  1:17 PM  Result Value Ref Range   Sodium 141 135 - 145 mmol/L   Potassium 4.0 3.5 - 5.1 mmol/L   Chloride 104 98 - 111 mmol/L   CO2 27 22 - 32 mmol/L   Glucose, Bld 276 (H) 70 - 99 mg/dL   BUN 17 8 - 23 mg/dL   Creatinine, Ser 1.03 0.61 - 1.24 mg/dL   Calcium 9.1 8.9 - 10.3 mg/dL   GFR calc non Af Amer >60 >60 mL/min   GFR calc Af Amer >60 >60 mL/min   Anion gap 10 5 - 15    Comment: Performed at [facility], [address] [place], [place] [address]  CBC     Status: None   Collection Time: 08/17/19  1:17 PM  Result Value Ref Range   WBC 5.7 4.0 - 10.5 K/uL   RBC 5.05 4.22 - 5.81 MIL/uL   Hemoglobin 15.0 13.0 - 17.0 g/dL    HCT 44.4 39.0 - 52.0 %   MCV 87.9 80.0 - 100.0 fL   MCH 29.7 26.0 - 34.0 pg   MCHC 33.8 30.0 - 36.0 g/dL   RDW 12.5 11.5 - 15.5 %   Platelets 220 150 - 400 K/uL   nRBC 0.0 0.0 - 0.2 %    Comment: Performed at [facility], [address] [place], [place] [address]  Protime-INR     Status: None   Collection Time: 08/17/19  1:17 PM  Result Value Ref Range   Prothrombin Time 13.0 11.4 - 15.2 seconds   INR 1.0 0.8 - 1.2    Comment: (NOTE) INR goal varies based on device and disease states. Performed at [facility], [address], [place], [place] [address]   Magnesium     Status: None   Collection Time: 08/17/19  1:17 PM  Result Value Ref Range   Magnesium 1.9 1.7 - 2.4 mg/dL    Comment: Performed at [facility], [address] [place], [place] [address]  Hemoglobin A1c     Status: Abnormal   Collection Time: 08/17/19  1:17 PM  Result Value Ref Range   Hgb A1c MFr Bld 11.4 (H) 4.8 - 5.6 %    Comment: (NOTE) Pre diabetes:          5.7%-6.4% Diabetes:              >6.4% Glycemic control for   <7.0% adults with diabetes    Mean Plasma Glucose 280.48 mg/dL    Comment: Performed at [facility] [address] [place],  [address]  SARS Coronavirus 2 [facility] order, Performed in [facility] hospital lab) Nasopharyngeal Nasopharyngeal Swab     Status: None   Collection Time: 08/17/19  3:13 PM   Specimen: Nasopharyngeal Swab  Result Value Ref Range   SARS Coronavirus 2 NEGATIVE NEGATIVE    Comment: (NOTE) If result is NEGATIVE SARS-CoV-2 target nucleic acids are NOT DETECTED. The SARS-CoV-2 RNA is generally detectable in upper and lower  respiratory specimens during the acute phase of infection. The lowest  concentration of SARS-CoV-2 viral copies this assay can detect is 250  copies / mL. A negative result does not preclude SARS-CoV-2 infection  and should not be used as the sole basis for treatment or other  patient management decisions.  A negative result may  occur with  improper specimen collection / handling, submission of specimen other  than nasopharyngeal swab, presence of viral mutation(s) within the  areas targeted by this assay, and inadequate number of viral copies  (<250 copies / mL). A negative result must be combined with clinical  observations, patient history, and epidemiological information. If result is POSITIVE SARS-CoV-2 target nucleic acids are DETECTED. The SARS-CoV-2 RNA is generally detectable in upper and lower  respiratory specimens dur ing the acute phase of infection.  Positive  results are indicative of active infection with SARS-CoV-2.  Clinical  correlation with patient history and other diagnostic information is  necessary to determine patient infection status.  Positive results do  not rule out bacterial infection or co-infection with other viruses. If result is PRESUMPTIVE POSTIVE SARS-CoV-2 nucleic acids MAY BE PRESENT.   A presumptive positive result was obtained on the submitted specimen  and confirmed on repeat testing.  While 2019 novel coronavirus  (SARS-CoV-2) nucleic acids may be present in the submitted sample  additional confirmatory testing may be necessary for epidemiological  and / or clinical management purposes  to differentiate between  SARS-CoV-2 and other Sarbecovirus currently known to infect humans.  If clinically indicated additional testing with an alternate test  methodology [phone]) is advised. The SARS-CoV-2 RNA is generally  detectable in upper and lower respiratory sp ecimens during the acute  phase of infection. The expected result is Negative. Fact Sheet for Patients:  StrictlyIdeas.no Fact Sheet for Healthcare Providers: BankingDealers.co.za This test is not yet approved or cleared by the [place] FDA and has been authorized for detection and/or diagnosis of SARS-CoV-2 by FDA under an Emergency Use Authorization (EUA).  This  EUA will remain in effect (meaning this test can be used) for the duration of the COVID-19 declaration under Section 564(b)(1) of the Act, 21 U.S.C. section 360bbb-3(b)(1), unless the authorization is terminated or revoked sooner. Performed at [facility], [address], [place], [place] [address]   Troponin I (High Sensitivity)     Status: None   Collection Time: 08/17/19  5:33 PM  Result Value Ref Range   Troponin I (High Sensitivity) 3 <18 ng/L    Comment: (NOTE) Elevated high sensitivity troponin I (hsTnI) values and significant  changes across serial measurements may suggest ACS but many other  chronic and acute conditions are known to elevate hsTnI results.  Refer to the "Links" section for chest pain algorithms and additional  guidance. Performed at [facility], [address] [place], [place] [address]   Glucose, capillary     Status: Abnormal   Collection Time: 08/17/19  6:50 PM  Result Value Ref Range   Glucose-Capillary 301 (H) 70 - 99 mg/dL  Troponin I (High Sensitivity)     Status: None   Collection Time: 08/17/19  8:26 PM  Result Value Ref Range   Troponin I (High Sensitivity) 3 <18 ng/L    Comment: (NOTE) Elevated high sensitivity troponin I (hsTnI) values and significant  changes across serial measurements may suggest ACS but many other  chronic and acute conditions are known to elevate hsTnI results.  Refer to the "Links" section for chest pain algorithms and additional  guidance. Performed at [facility], [address], [place], [place] [address]   Glucose, capillary     Status: Abnormal   Collection Time: 08/17/19  9:14 PM  Result Value Ref Range   Glucose-Capillary 216 (H) 70 - 99 mg/dL  Lipid panel  Status: Abnormal   Collection Time: 08/18/19  5:03 AM  Result Value Ref Range   Cholesterol 130 0 - 200 mg/dL   Triglycerides 76 <150 mg/dL   HDL 39 (L) >40 mg/dL   Total CHOL/HDL Ratio 3.3 RATIO   VLDL 15 0 - 40 mg/dL   LDL Cholesterol 76 0 - 99  mg/dL    Comment:        Total Cholesterol/HDL:CHD Risk Coronary Heart Disease Risk Table                     Men   Women  1/2 Average Risk   3.4   3.3  Average Risk       5.0   4.4  2 X Average Risk   9.6   7.1  3 X Average Risk  23.4   11.0        Use the calculated Patient Ratio above and the CHD Risk Table to determine the patient's CHD Risk.        ATP III CLASSIFICATION (LDL):  <100     mg/dL   Optimal  100-129  mg/dL   Near or Above                    Optimal  130-159  mg/dL   Borderline  160-189  mg/dL   High  >190     mg/dL   Very High Performed at [facility], [address] [place], [place] [address]   Glucose, capillary     Status: Abnormal   Collection Time: 08/18/19  7:37 AM  Result Value Ref Range   Glucose-Capillary 148 (H) 70 - 99 mg/dL  Glucose, capillary     Status: Abnormal   Collection Time: 08/18/19 11:00 AM  Result Value Ref Range   Glucose-Capillary 148 (H) 70 - 99 mg/dL  Glucose, capillary     Status: Abnormal   Collection Time: 08/18/19  4:12 PM  Result Value Ref Range   Glucose-Capillary 159 (H) 70 - 99 mg/dL    Studies/Results:  BRAIN MRI MRA NECK MRA EXAM: MRI HEAD WITHOUT CONTRAST  MRA HEAD WITHOUT CONTRAST  MRA NECK WITHOUT AND WITH CONTRAST  TECHNIQUE: Multiplanar, multiecho pulse sequences of the brain and surrounding structures were obtained without intravenous contrast. Angiographic images of the Circle of Willis were obtained using MRA technique without intravenous contrast. Angiographic images of the neck were obtained using MRA technique without and with intravenous contrast. Carotid stenosis measurements (when applicable) are obtained utilizing NASCET criteria, using the distal internal carotid diameter as the denominator.  CONTRAST:  7.5 cc of Gadavist.  COMPARISON:  Prior head CT from earlier the same day.  FINDINGS: MRI HEAD FINDINGS  Brain: Diffuse prominence of the CSF containing spaces compatible  with generalized age-related cerebral atrophy. Patchy and confluent T2/FLAIR hyperintensity within the periventricular deep white matter both cerebral hemispheres most consistent with chronic small vessel ischemic disease, moderate nature. Multiple scattered superimposed remote lacunar infarcts seen involving the hemispheric cerebral white matter bilaterally, with additional remote lacunar infarcts involving the bilateral basal ganglia and left thalamus.  No abnormal foci of restricted diffusion to suggest acute or subacute ischemia. Gray-white matter differentiation maintained. No areas of remote cortical infarction. No acute intracranial hemorrhage. Multiple scattered chronic micro hemorrhages seen predominantly clustered about the deep gray nuclei and cerebellum, most likely related to chronic poorly controlled hypertension.  No mass lesion, midline shift or mass effect. Of hydrocephalus. No extra-axial fluid collection. Pituitary  gland suprasellar region normal. Midline structures intact. Ventricular prominence related to global parenchymal volume loss  Vascular: Major intracranial vascular flow voids grossly maintained at the skull base.  Skull and upper cervical spine: Craniocervical junction within normal limits. Visualized upper cervical spine normal. Bone marrow signal intensity within normal limits. No scalp soft tissue abnormality.  Sinuses/Orbits: Globes and orbital soft tissues demonstrate no acute finding. Mild scattered mucosal thickening noted throughout the paranasal sinuses. No air-fluid level to suggest acute sinusitis. No mastoid effusion. Inner ear structures grossly normal.  Other: None.  MRA HEAD FINDINGS  ANTERIOR CIRCULATION:  Examination technically limited by motion. Distal cervical segments of the internal carotid arteries are widely patent with symmetric antegrade flow. Petrous, cavernous, and supraclinoid segments widely patent without  flow-limiting stenosis. A1 segments patent bilaterally. Normal anterior communicating artery complex. Anterior cerebral arteries patent to their distal aspects without flow-limiting stenosis. No M1 stenosis or occlusion. Normal MCA bifurcations. Distal MCA branches well perfused and symmetric.  POSTERIOR CIRCULATION:  Evaluation of the vertebral arteries mildly limited by artifact on this exam. Vertebral arteries patent as they course into the cranial vault, and are grossly patent to the vertebrobasilar junction without flow-limiting stenosis. Focal signal loss within the distal left V4 segment, consistent with artifact as this appears patent on corresponding MRA of the neck. Normal flow void seen within this region on corresponding MRI. Left PICA patent. Right PICA not visualized. Basilar tortuous with short-segment fenestration at its proximal aspect, best seen on corresponding MRA of the neck. Short-segment mild stenosis at the distal basilar (series 1, image 75). Basilar otherwise widely patent. Superior cerebral arteries patent proximally. Left PCA primarily supplied via the basilar. Hypoplastic right P1 segment with robust right posterior communicating artery supplies the right PCA. Right PCA perfused to its distal aspect without stenosis. Left PCA patent proximally, not well seen distally, most consistent with artifact as this appears patent on corresponding MRA of the neck.  No intracranial aneurysm.  MRA NECK FINDINGS  Source images reviewed.  Visualized aortic arch of normal caliber with normal 3 vessel morphology. No hemodynamically significant stenosis seen about the origin of the great vessels. Visualized subclavian arteries widely patent.  Right CCA tortuous proximally but widely patent to the bifurcation without stenosis. No significant atheromatous narrowing seen about the right bifurcation. Right ICA tortuous but widely patent to the skull base without  stenosis, dissection or occlusion.  Left CCA tortuous proximally but widely patent to the bifurcation without stenosis. Minor atheromatous change about the proximal left ICA without hemodynamically significant stenosis. Left ICA tortuous distally but widely patent to the skull base without stenosis, dissection or occlusion.  Both vertebral arteries arise from the subclavian arteries. Origin of the vertebral arteries not well assessed due to motion. Visualized vertebral arteries widely patent within the neck without stenosis, dissection or occlusion. Right vertebral artery slightly dominant.  IMPRESSION: MRI HEAD IMPRESSION:  1. No acute intracranial infarct or other abnormality identified. 2. Moderately advanced cerebral atrophy with chronic microvascular ischemic disease, with multiple remote lacunar infarcts involving the hemispheric cerebral white matter and deep gray nuclei. 3. Multiple scattered chronic micro hemorrhages clustered about the deep gray nuclei and cerebellum, most consistent with underlying chronic hypertensive encephalopathy.  MRA HEAD IMPRESSION:  1. Negative intracranial MRA for large vessel occlusion. 2. Short-segment mild stenosis involving the distal basilar artery. No other hemodynamically significant or proximal correctable stenosis identified. 3. No intracranial aneurysm.  MRA NECK IMPRESSION:  1. Wide patency of both carotid artery systems  within the neck. No hemodynamically significant stenosis or other acute vascular abnormality. 2. Wide patency of both vertebral arteries within the neck. 3. Diffuse tortuosity of the major arterial vasculature of the neck, suggesting chronic underlying hypertension.   The brain MRI and MRA scans are reviewed along with the extracranial MRA.  No acute ischemic changes are noted.  There are several areas of susceptibility reduced signal seen on SWI consistent with multiple areas of chronic  microhemorrhages.  No acute findings are seen on DWI.  There is significant and marked global atrophy and significant confluent leukoencephalopathy.  There are several small encephalomalacia involving the frontal and parietal centrum semiovale on both sides consistent with multiple lacunar infarcts.  No intracranial or extracranial occlusive disease is appreciated.   Kemar Pandit A. Merlene Laughter, M.D.  Diplomate, Tax adviser of Psychiatry and Neurology ( Neurology). 08/18/2019, 4:32 PM

## 2019-08-18 NOTE — Progress Notes (Signed)
PROGRESS NOTE    Robert Osborne  [id number]  DOB: [birth year]  DOA: 08/17/2019 PCP: Sharion Balloon, FNP   Brief Admission Hx: 73 y.o. male with medical history significant for hypertension, diabetes mellitus, bladder cancer.  Patient presented to the ED with complaints of generalized weakness over the past several weeks, but over the past 2 days weakness has worsened, with difficulty walking.  Per notes patient spouse reported shuffling of his feet.  Patient saw his primary care provider with concern for stroke was sent to the ED.    On further questioning patient also reports intermittent chest pain worse with activity-especially at work (he works in [employer] ) and relieved by rest.  His pain is left-sided, nonradiating, and transiently associated with difficulty catching his breath.  MDM/Assessment & Plan:   1. Left facial droop - Pt had MRI /MRA brain /neck with no acute ischemic findings- continue apirin, follow echocardiogram, continue statin, follow lipids, A1c and inpatient neurology consult is pending.  PT evaluation with no PT follow up recommended.   2. Atypical chest pain - he was seen by inpatient cardiology service and no plans for inpatient work up, will follow echo and outpatient myoview study recommended. Follow up with Dr. Percival Spanish.  HS troponins ruled out acute injury.  3. Type 2 DM - continue SSI coverage and follow A1c.  4. Essential hypertension - resumed home meds and follow.  5. Dementia with reported beh disturbance - resumed home olanzapine.  His behavior has been stable in hospital.   DVT prophylaxis: lovenox Code Status: Full  Family Communication: patient updated bedside Disposition Plan: home tomorrow   Consultants:  Neurology  Cardiology  Procedures:  2D Echocardiogram  IMPRESSIONS  1. The left ventricle has normal systolic function, with an ejection fraction of 55-60%. The cavity size was normal. There is mildly increased left ventricular  wall thickness. Left ventricular diastolic Doppler parameters are consistent with impaired  relaxation.  2. The right ventricle has normal systolic function. The cavity was normal. There is no increase in right ventricular wall thickness.  3. The aortic valve is tricuspid. Mild aortic annular calcification noted.  4. The mitral valve is grossly normal.  5. The tricuspid valve is grossly normal.  6. The aorta is normal unless otherwise noted.  Antimicrobials:  n/a  Subjective: Pt reports that he has no difficulty with speech, or swallow, he denies headache and vision changes.  He is oriented to person and place at this time.   Objective: Vitals:   08/17/19 1921 08/17/19 2111 08/18/19 0435 08/18/19 1333  BP:  139/71 (!) 149/80 136/74  Pulse:  (!) 53 (!) 54 66  Resp:  18 16 18   Temp:  (!) 97.4 F (36.3 C) 98.5 F (36.9 C) 98.7 F (37.1 C)  TempSrc:  Oral Oral Oral  SpO2: 97% 98% 96% 97%  Weight:      Height:        Intake/Output Summary (Last 24 hours) at 08/18/2019 1619 Last data filed at 08/18/2019 1357 Gross per 24 hour  Intake 240 ml  Output --  Net 240 ml   Filed Weights   08/17/19 1824  Weight: 68.4 kg   REVIEW OF SYSTEMS  As per history otherwise all reviewed and reported negative  Exam:  General exam: awake, alert, NAD, cooperative.  Respiratory system:  No increased work of breathing. Cardiovascular system: S1 & S2 heard. No JVD, murmurs, gallops, clicks or pedal edema. Gastrointestinal system: Abdomen is nondistended, soft and nontender.  Normal bowel sounds heard. Central nervous system: Alert and oriented. No focal neurological deficits. Extremities: no CCE.  Data Reviewed: Basic Metabolic Panel: Recent Labs  Lab 08/17/19 1317  NA 141  K 4.0  CL 104  CO2 27  GLUCOSE 276*  BUN 17  CREATININE 1.03  CALCIUM 9.1  MG 1.9   Liver Function Tests: No results for input(s): AST, ALT, ALKPHOS, BILITOT, PROT, ALBUMIN in the last 168 hours. No results  for input(s): LIPASE, AMYLASE in the last 168 hours. No results for input(s): AMMONIA in the last 168 hours. CBC: Recent Labs  Lab 08/17/19 1317  WBC 5.7  HGB 15.0  HCT 44.4  MCV 87.9  PLT 220   Cardiac Enzymes: No results for input(s): CKTOTAL, CKMB, CKMBINDEX, TROPONINI in the last 168 hours. CBG (last 3)  Recent Labs    08/18/19 0737 08/18/19 1100 08/18/19 1612  GLUCAP 148* 148* 159*   Recent Results (from the past 240 hour(s))  SARS Coronavirus 2 [facility] order, Performed in [facility] hospital lab) Nasopharyngeal Nasopharyngeal Swab     Status: None   Collection Time: 08/17/19  3:13 PM   Specimen: Nasopharyngeal Swab  Result Value Ref Range Status   SARS Coronavirus 2 NEGATIVE NEGATIVE Final    Comment: (NOTE) If result is NEGATIVE SARS-CoV-2 target nucleic acids are NOT DETECTED. The SARS-CoV-2 RNA is generally detectable in upper and lower  respiratory specimens during the acute phase of infection. The lowest  concentration of SARS-CoV-2 viral copies this assay can detect is 250  copies / mL. A negative result does not preclude SARS-CoV-2 infection  and should not be used as the sole basis for treatment or other  patient management decisions.  A negative result may occur with  improper specimen collection / handling, submission of specimen other  than nasopharyngeal swab, presence of viral mutation(s) within the  areas targeted by this assay, and inadequate number of viral copies  (<250 copies / mL). A negative result must be combined with clinical  observations, patient history, and epidemiological information. If result is POSITIVE SARS-CoV-2 target nucleic acids are DETECTED. The SARS-CoV-2 RNA is generally detectable in upper and lower  respiratory specimens dur ing the acute phase of infection.  Positive  results are indicative of active infection with SARS-CoV-2.  Clinical  correlation with patient history and other diagnostic information is  necessary  to determine patient infection status.  Positive results do  not rule out bacterial infection or co-infection with other viruses. If result is PRESUMPTIVE POSTIVE SARS-CoV-2 nucleic acids MAY BE PRESENT.   A presumptive positive result was obtained on the submitted specimen  and confirmed on repeat testing.  While 2019 novel coronavirus  (SARS-CoV-2) nucleic acids may be present in the submitted sample  additional confirmatory testing may be necessary for epidemiological  and / or clinical management purposes  to differentiate between  SARS-CoV-2 and other Sarbecovirus currently known to infect humans.  If clinically indicated additional testing with an alternate test  methodology [phone]) is advised. The SARS-CoV-2 RNA is generally  detectable in upper and lower respiratory sp ecimens during the acute  phase of infection. The expected result is Negative. Fact Sheet for Patients:  StrictlyIdeas.no Fact Sheet for Healthcare Providers: BankingDealers.co.za This test is not yet approved or cleared by the [place] FDA and has been authorized for detection and/or diagnosis of SARS-CoV-2 by FDA under an Emergency Use Authorization (EUA).  This EUA will remain in effect (meaning this test can be used)  for the duration of the COVID-19 declaration under Section 564(b)(1) of the Act, 21 U.S.C. section 360bbb-3(b)(1), unless the authorization is terminated or revoked sooner. Performed at [facility], [address], [place], [place] [address]      Studies: Dg Chest 2 View  Result Date: 08/17/2019 CLINICAL DATA:  Per Triage Note: Patient reports progressive weakness that started several days ago. States he is having difficulty walking. Sent by Josie Saunders for eval for possible stroke.generalized weakness EXAM: CHEST - 2 VIEW COMPARISON:  01/05/2017 FINDINGS: Normal cardiac silhouette with ectatic aorta. Lungs are mildly hyperinflated. No  effusion, infiltrate pneumothorax. Remote RIGHT rib fracture. IMPRESSION: No acute cardiopulmonary process. Electronically Signed   By: Suzy Bouchard M.D.   On: 08/17/2019 14:55   Ct Head Wo Contrast  Result Date: 08/17/2019 CLINICAL DATA:  Progressive weakness developing over the past several days. EXAM: CT HEAD WITHOUT CONTRAST TECHNIQUE: Contiguous axial images were obtained from the base of the skull through the vertex without intravenous contrast. COMPARISON:  Head CT scan 11/20/2017. FINDINGS: Brain: No evidence of acute infarction, hemorrhage, hydrocephalus, extra-axial collection or mass lesion/mass effect. Atrophy and chronic microvascular ischemic change noted. Vascular: No hyperdense vessel or unexpected calcification. Skull: Intact.  No focal lesion. Sinuses/Orbits: Negative. Other: None. IMPRESSION: No acute abnormality. Atrophy and chronic microvascular ischemic change. Electronically Signed   By: Inge Rise M.D.   On: 08/17/2019 14:26   Mr Angio Head Wo Contrast  Result Date: 08/17/2019 CLINICAL DATA:  Initial evaluation for acute left-sided facial droop and left-sided weakness for 2 days. EXAM: MRI HEAD WITHOUT CONTRAST MRA HEAD WITHOUT CONTRAST MRA NECK WITHOUT AND WITH CONTRAST TECHNIQUE: Multiplanar, multiecho pulse sequences of the brain and surrounding structures were obtained without intravenous contrast. Angiographic images of the Circle of Willis were obtained using MRA technique without intravenous contrast. Angiographic images of the neck were obtained using MRA technique without and with intravenous contrast. Carotid stenosis measurements (when applicable) are obtained utilizing NASCET criteria, using the distal internal carotid diameter as the denominator. CONTRAST:  7.5 cc of Gadavist. COMPARISON:  Prior head CT from earlier the same day. FINDINGS: MRI HEAD FINDINGS Brain: Diffuse prominence of the CSF containing spaces compatible with generalized age-related cerebral  atrophy. Patchy and confluent T2/FLAIR hyperintensity within the periventricular deep white matter both cerebral hemispheres most consistent with chronic small vessel ischemic disease, moderate nature. Multiple scattered superimposed remote lacunar infarcts seen involving the hemispheric cerebral white matter bilaterally, with additional remote lacunar infarcts involving the bilateral basal ganglia and left thalamus. No abnormal foci of restricted diffusion to suggest acute or subacute ischemia. Gray-white matter differentiation maintained. No areas of remote cortical infarction. No acute intracranial hemorrhage. Multiple scattered chronic micro hemorrhages seen predominantly clustered about the deep gray nuclei and cerebellum, most likely related to chronic poorly controlled hypertension. No mass lesion, midline shift or mass effect. Of hydrocephalus. No extra-axial fluid collection. Pituitary gland suprasellar region normal. Midline structures intact. Ventricular prominence related to global parenchymal volume loss Vascular: Major intracranial vascular flow voids grossly maintained at the skull base. Skull and upper cervical spine: Craniocervical junction within normal limits. Visualized upper cervical spine normal. Bone marrow signal intensity within normal limits. No scalp soft tissue abnormality. Sinuses/Orbits: Globes and orbital soft tissues demonstrate no acute finding. Mild scattered mucosal thickening noted throughout the paranasal sinuses. No air-fluid level to suggest acute sinusitis. No mastoid effusion. Inner ear structures grossly normal. Other: None. MRA HEAD FINDINGS ANTERIOR CIRCULATION: Examination technically limited by motion. Distal cervical  segments of the internal carotid arteries are widely patent with symmetric antegrade flow. Petrous, cavernous, and supraclinoid segments widely patent without flow-limiting stenosis. A1 segments patent bilaterally. Normal anterior communicating artery  complex. Anterior cerebral arteries patent to their distal aspects without flow-limiting stenosis. No M1 stenosis or occlusion. Normal MCA bifurcations. Distal MCA branches well perfused and symmetric. POSTERIOR CIRCULATION: Evaluation of the vertebral arteries mildly limited by artifact on this exam. Vertebral arteries patent as they course into the cranial vault, and are grossly patent to the vertebrobasilar junction without flow-limiting stenosis. Focal signal loss within the distal left V4 segment, consistent with artifact as this appears patent on corresponding MRA of the neck. Normal flow void seen within this region on corresponding MRI. Left PICA patent. Right PICA not visualized. Basilar tortuous with short-segment fenestration at its proximal aspect, best seen on corresponding MRA of the neck. Short-segment mild stenosis at the distal basilar (series 1, image 75). Basilar otherwise widely patent. Superior cerebral arteries patent proximally. Left PCA primarily supplied via the basilar. Hypoplastic right P1 segment with robust right posterior communicating artery supplies the right PCA. Right PCA perfused to its distal aspect without stenosis. Left PCA patent proximally, not well seen distally, most consistent with artifact as this appears patent on corresponding MRA of the neck. No intracranial aneurysm. MRA NECK FINDINGS Source images reviewed. Visualized aortic arch of normal caliber with normal 3 vessel morphology. No hemodynamically significant stenosis seen about the origin of the great vessels. Visualized subclavian arteries widely patent. Right CCA tortuous proximally but widely patent to the bifurcation without stenosis. No significant atheromatous narrowing seen about the right bifurcation. Right ICA tortuous but widely patent to the skull base without stenosis, dissection or occlusion. Left CCA tortuous proximally but widely patent to the bifurcation without stenosis. Minor atheromatous change  about the proximal left ICA without hemodynamically significant stenosis. Left ICA tortuous distally but widely patent to the skull base without stenosis, dissection or occlusion. Both vertebral arteries arise from the subclavian arteries. Origin of the vertebral arteries not well assessed due to motion. Visualized vertebral arteries widely patent within the neck without stenosis, dissection or occlusion. Right vertebral artery slightly dominant. IMPRESSION: MRI HEAD IMPRESSION: 1. No acute intracranial infarct or other abnormality identified. 2. Moderately advanced cerebral atrophy with chronic microvascular ischemic disease, with multiple remote lacunar infarcts involving the hemispheric cerebral white matter and deep gray nuclei. 3. Multiple scattered chronic micro hemorrhages clustered about the deep gray nuclei and cerebellum, most consistent with underlying chronic hypertensive encephalopathy. MRA HEAD IMPRESSION: 1. Negative intracranial MRA for large vessel occlusion. 2. Short-segment mild stenosis involving the distal basilar artery. No other hemodynamically significant or proximal correctable stenosis identified. 3. No intracranial aneurysm. MRA NECK IMPRESSION: 1. Wide patency of both carotid artery systems within the neck. No hemodynamically significant stenosis or other acute vascular abnormality. 2. Wide patency of both vertebral arteries within the neck. 3. Diffuse tortuosity of the major arterial vasculature of the neck, suggesting chronic underlying hypertension. Electronically Signed   By: Jeannine Boga M.D.   On: 08/17/2019 20:33   Mr Angio Neck W Wo Contrast  Result Date: 08/17/2019 CLINICAL DATA:  Initial evaluation for acute left-sided facial droop and left-sided weakness for 2 days. EXAM: MRI HEAD WITHOUT CONTRAST MRA HEAD WITHOUT CONTRAST MRA NECK WITHOUT AND WITH CONTRAST TECHNIQUE: Multiplanar, multiecho pulse sequences of the brain and surrounding structures were obtained without  intravenous contrast. Angiographic images of the Circle of Willis were obtained using MRA technique without  intravenous contrast. Angiographic images of the neck were obtained using MRA technique without and with intravenous contrast. Carotid stenosis measurements (when applicable) are obtained utilizing NASCET criteria, using the distal internal carotid diameter as the denominator. CONTRAST:  7.5 cc of Gadavist. COMPARISON:  Prior head CT from earlier the same day. FINDINGS: MRI HEAD FINDINGS Brain: Diffuse prominence of the CSF containing spaces compatible with generalized age-related cerebral atrophy. Patchy and confluent T2/FLAIR hyperintensity within the periventricular deep white matter both cerebral hemispheres most consistent with chronic small vessel ischemic disease, moderate nature. Multiple scattered superimposed remote lacunar infarcts seen involving the hemispheric cerebral white matter bilaterally, with additional remote lacunar infarcts involving the bilateral basal ganglia and left thalamus. No abnormal foci of restricted diffusion to suggest acute or subacute ischemia. Gray-white matter differentiation maintained. No areas of remote cortical infarction. No acute intracranial hemorrhage. Multiple scattered chronic micro hemorrhages seen predominantly clustered about the deep gray nuclei and cerebellum, most likely related to chronic poorly controlled hypertension. No mass lesion, midline shift or mass effect. Of hydrocephalus. No extra-axial fluid collection. Pituitary gland suprasellar region normal. Midline structures intact. Ventricular prominence related to global parenchymal volume loss Vascular: Major intracranial vascular flow voids grossly maintained at the skull base. Skull and upper cervical spine: Craniocervical junction within normal limits. Visualized upper cervical spine normal. Bone marrow signal intensity within normal limits. No scalp soft tissue abnormality. Sinuses/Orbits: Globes  and orbital soft tissues demonstrate no acute finding. Mild scattered mucosal thickening noted throughout the paranasal sinuses. No air-fluid level to suggest acute sinusitis. No mastoid effusion. Inner ear structures grossly normal. Other: None. MRA HEAD FINDINGS ANTERIOR CIRCULATION: Examination technically limited by motion. Distal cervical segments of the internal carotid arteries are widely patent with symmetric antegrade flow. Petrous, cavernous, and supraclinoid segments widely patent without flow-limiting stenosis. A1 segments patent bilaterally. Normal anterior communicating artery complex. Anterior cerebral arteries patent to their distal aspects without flow-limiting stenosis. No M1 stenosis or occlusion. Normal MCA bifurcations. Distal MCA branches well perfused and symmetric. POSTERIOR CIRCULATION: Evaluation of the vertebral arteries mildly limited by artifact on this exam. Vertebral arteries patent as they course into the cranial vault, and are grossly patent to the vertebrobasilar junction without flow-limiting stenosis. Focal signal loss within the distal left V4 segment, consistent with artifact as this appears patent on corresponding MRA of the neck. Normal flow void seen within this region on corresponding MRI. Left PICA patent. Right PICA not visualized. Basilar tortuous with short-segment fenestration at its proximal aspect, best seen on corresponding MRA of the neck. Short-segment mild stenosis at the distal basilar (series 1, image 75). Basilar otherwise widely patent. Superior cerebral arteries patent proximally. Left PCA primarily supplied via the basilar. Hypoplastic right P1 segment with robust right posterior communicating artery supplies the right PCA. Right PCA perfused to its distal aspect without stenosis. Left PCA patent proximally, not well seen distally, most consistent with artifact as this appears patent on corresponding MRA of the neck. No intracranial aneurysm. MRA NECK  FINDINGS Source images reviewed. Visualized aortic arch of normal caliber with normal 3 vessel morphology. No hemodynamically significant stenosis seen about the origin of the great vessels. Visualized subclavian arteries widely patent. Right CCA tortuous proximally but widely patent to the bifurcation without stenosis. No significant atheromatous narrowing seen about the right bifurcation. Right ICA tortuous but widely patent to the skull base without stenosis, dissection or occlusion. Left CCA tortuous proximally but widely patent to the bifurcation without stenosis. Minor atheromatous change about the  proximal left ICA without hemodynamically significant stenosis. Left ICA tortuous distally but widely patent to the skull base without stenosis, dissection or occlusion. Both vertebral arteries arise from the subclavian arteries. Origin of the vertebral arteries not well assessed due to motion. Visualized vertebral arteries widely patent within the neck without stenosis, dissection or occlusion. Right vertebral artery slightly dominant. IMPRESSION: MRI HEAD IMPRESSION: 1. No acute intracranial infarct or other abnormality identified. 2. Moderately advanced cerebral atrophy with chronic microvascular ischemic disease, with multiple remote lacunar infarcts involving the hemispheric cerebral white matter and deep gray nuclei. 3. Multiple scattered chronic micro hemorrhages clustered about the deep gray nuclei and cerebellum, most consistent with underlying chronic hypertensive encephalopathy. MRA HEAD IMPRESSION: 1. Negative intracranial MRA for large vessel occlusion. 2. Short-segment mild stenosis involving the distal basilar artery. No other hemodynamically significant or proximal correctable stenosis identified. 3. No intracranial aneurysm. MRA NECK IMPRESSION: 1. Wide patency of both carotid artery systems within the neck. No hemodynamically significant stenosis or other acute vascular abnormality. 2. Wide patency  of both vertebral arteries within the neck. 3. Diffuse tortuosity of the major arterial vasculature of the neck, suggesting chronic underlying hypertension. Electronically Signed   By: Jeannine Boga M.D.   On: 08/17/2019 20:33   Mr Brain Wo Contrast  Result Date: 08/17/2019 CLINICAL DATA:  Initial evaluation for acute left-sided facial droop and left-sided weakness for 2 days. EXAM: MRI HEAD WITHOUT CONTRAST MRA HEAD WITHOUT CONTRAST MRA NECK WITHOUT AND WITH CONTRAST TECHNIQUE: Multiplanar, multiecho pulse sequences of the brain and surrounding structures were obtained without intravenous contrast. Angiographic images of the Circle of Willis were obtained using MRA technique without intravenous contrast. Angiographic images of the neck were obtained using MRA technique without and with intravenous contrast. Carotid stenosis measurements (when applicable) are obtained utilizing NASCET criteria, using the distal internal carotid diameter as the denominator. CONTRAST:  7.5 cc of Gadavist. COMPARISON:  Prior head CT from earlier the same day. FINDINGS: MRI HEAD FINDINGS Brain: Diffuse prominence of the CSF containing spaces compatible with generalized age-related cerebral atrophy. Patchy and confluent T2/FLAIR hyperintensity within the periventricular deep white matter both cerebral hemispheres most consistent with chronic small vessel ischemic disease, moderate nature. Multiple scattered superimposed remote lacunar infarcts seen involving the hemispheric cerebral white matter bilaterally, with additional remote lacunar infarcts involving the bilateral basal ganglia and left thalamus. No abnormal foci of restricted diffusion to suggest acute or subacute ischemia. Gray-white matter differentiation maintained. No areas of remote cortical infarction. No acute intracranial hemorrhage. Multiple scattered chronic micro hemorrhages seen predominantly clustered about the deep gray nuclei and cerebellum, most likely  related to chronic poorly controlled hypertension. No mass lesion, midline shift or mass effect. Of hydrocephalus. No extra-axial fluid collection. Pituitary gland suprasellar region normal. Midline structures intact. Ventricular prominence related to global parenchymal volume loss Vascular: Major intracranial vascular flow voids grossly maintained at the skull base. Skull and upper cervical spine: Craniocervical junction within normal limits. Visualized upper cervical spine normal. Bone marrow signal intensity within normal limits. No scalp soft tissue abnormality. Sinuses/Orbits: Globes and orbital soft tissues demonstrate no acute finding. Mild scattered mucosal thickening noted throughout the paranasal sinuses. No air-fluid level to suggest acute sinusitis. No mastoid effusion. Inner ear structures grossly normal. Other: None. MRA HEAD FINDINGS ANTERIOR CIRCULATION: Examination technically limited by motion. Distal cervical segments of the internal carotid arteries are widely patent with symmetric antegrade flow. Petrous, cavernous, and supraclinoid segments widely patent without flow-limiting stenosis. A1 segments patent bilaterally.  Normal anterior communicating artery complex. Anterior cerebral arteries patent to their distal aspects without flow-limiting stenosis. No M1 stenosis or occlusion. Normal MCA bifurcations. Distal MCA branches well perfused and symmetric. POSTERIOR CIRCULATION: Evaluation of the vertebral arteries mildly limited by artifact on this exam. Vertebral arteries patent as they course into the cranial vault, and are grossly patent to the vertebrobasilar junction without flow-limiting stenosis. Focal signal loss within the distal left V4 segment, consistent with artifact as this appears patent on corresponding MRA of the neck. Normal flow void seen within this region on corresponding MRI. Left PICA patent. Right PICA not visualized. Basilar tortuous with short-segment fenestration at its  proximal aspect, best seen on corresponding MRA of the neck. Short-segment mild stenosis at the distal basilar (series 1, image 75). Basilar otherwise widely patent. Superior cerebral arteries patent proximally. Left PCA primarily supplied via the basilar. Hypoplastic right P1 segment with robust right posterior communicating artery supplies the right PCA. Right PCA perfused to its distal aspect without stenosis. Left PCA patent proximally, not well seen distally, most consistent with artifact as this appears patent on corresponding MRA of the neck. No intracranial aneurysm. MRA NECK FINDINGS Source images reviewed. Visualized aortic arch of normal caliber with normal 3 vessel morphology. No hemodynamically significant stenosis seen about the origin of the great vessels. Visualized subclavian arteries widely patent. Right CCA tortuous proximally but widely patent to the bifurcation without stenosis. No significant atheromatous narrowing seen about the right bifurcation. Right ICA tortuous but widely patent to the skull base without stenosis, dissection or occlusion. Left CCA tortuous proximally but widely patent to the bifurcation without stenosis. Minor atheromatous change about the proximal left ICA without hemodynamically significant stenosis. Left ICA tortuous distally but widely patent to the skull base without stenosis, dissection or occlusion. Both vertebral arteries arise from the subclavian arteries. Origin of the vertebral arteries not well assessed due to motion. Visualized vertebral arteries widely patent within the neck without stenosis, dissection or occlusion. Right vertebral artery slightly dominant. IMPRESSION: MRI HEAD IMPRESSION: 1. No acute intracranial infarct or other abnormality identified. 2. Moderately advanced cerebral atrophy with chronic microvascular ischemic disease, with multiple remote lacunar infarcts involving the hemispheric cerebral white matter and deep gray nuclei. 3. Multiple  scattered chronic micro hemorrhages clustered about the deep gray nuclei and cerebellum, most consistent with underlying chronic hypertensive encephalopathy. MRA HEAD IMPRESSION: 1. Negative intracranial MRA for large vessel occlusion. 2. Short-segment mild stenosis involving the distal basilar artery. No other hemodynamically significant or proximal correctable stenosis identified. 3. No intracranial aneurysm. MRA NECK IMPRESSION: 1. Wide patency of both carotid artery systems within the neck. No hemodynamically significant stenosis or other acute vascular abnormality. 2. Wide patency of both vertebral arteries within the neck. 3. Diffuse tortuosity of the major arterial vasculature of the neck, suggesting chronic underlying hypertension. Electronically Signed   By: Jeannine Boga M.D.   On: 08/17/2019 20:33   Scheduled Meds:  amLODipine  10 mg Oral Daily   aspirin  325 mg Oral Daily   atorvastatin  40 mg Oral q1800   enoxaparin (LOVENOX) injection  40 mg Subcutaneous Q24H   insulin aspart  0-9 Units Subcutaneous TID WC   losartan  25 mg Oral Daily   OLANZapine  2.5 mg Oral QHS   Continuous Infusions:  Principal Problem:   Facial droop   Time spent:   Irwin Brakeman, MD [facility] 08/18/2019, 4:19 PM    LOS: 0 days  How to contact the [facility]  Attending or Consulting provider [place] or covering provider during after hours [place], for this patient?  1. Check the care team in [facility] and look for a) attending/consulting [facility] provider listed and b) the [facility] team listed 2. Log into [email or web address] and use [facility] universal password to access. If you do not have the password, please contact the hospital operator. 3. Locate the [facility] provider you are looking for under [facility] and page to a number that you can be directly reached. 4. If you still have difficulty reaching the provider, please page the [facility] (Director on Call) for the Hospitalists listed on amion for  assistance.

## 2019-08-18 NOTE — Evaluation (Signed)
Physical Therapy Evaluation Patient Details Name: Robert Osborne MRN: [id number] DOB: 1946-04-19 Today's Date: 08/18/2019   History of Present Illness  Robert Osborne is a 73 y.o. male with medical history significant for hypertension, diabetes mellitus, bladder cancer.  Patient presented to the ED with complaints of generalized weakness over the past several weeks, but over the past 2 days weakness has worsened, with difficulty walking.  Per notes patient spouse reported shuffling of his feet.  Patient saw his primary care provider with concern for stroke was sent to the ED. MRI negative for acute infarct.     Clinical Impression  Patient functioning near baseline for functional mobility and gait.  Plan:  Patient discharged from physical therapy to care of nursing for ambulation daily as tolerated for length of stay.     Follow Up Recommendations No PT follow up    Equipment Recommendations  None recommended by PT    Recommendations for Other Services       Precautions / Restrictions Precautions Precautions: None Restrictions Weight Bearing Restrictions: No      Mobility  Bed Mobility Overal bed mobility: Modified Independent                Transfers Overall transfer level: Modified independent Equipment used: None                Ambulation/Gait Ambulation/Gait assistance: Modified independent (Device/Increase time) Gait Distance (Feet): 120 Feet Assistive device: None Gait Pattern/deviations: WFL(Within Functional Limits) Gait velocity: decreased   General Gait Details: slightly labored cadence without loss of balance  Stairs Stairs: Yes Stairs assistance: Modified independent (Device/Increase time) Stair Management: Two rails;Alternating pattern;No rails;Step to pattern Number of Stairs: 9 General stair comments: demonstrates good return for going up/down 2 steps without use of siderails with step to pattern, up/down 7 steps using bilateral  siderails with alternating pattern with no loss of balance  Wheelchair Mobility    Modified Rankin (Stroke Patients Only)       Balance Overall balance assessment: Mild deficits observed, not formally tested                                           Pertinent Vitals/Pain Pain Assessment: No/denies pain    Home Living Family/patient expects to be discharged to:: Private residence Living Arrangements: Spouse/significant other Available Help at Discharge: Family;Available 24 hours/day Type of Home: House Home Access: Stairs to enter Entrance Stairs-Rails: None Entrance Stairs-Number of Steps: 2 Home Layout: One level Home Equipment: Cane - single point;Grab bars - tub/shower      Prior Function Level of Independence: Independent         Comments: Pt drives, landscapes, independent with mobility and ADLs, community ambulator without AD     Hand Dominance   Dominant Hand: Right    Extremity/Trunk Assessment   Upper Extremity Assessment Upper Extremity Assessment: Defer to OT evaluation    Lower Extremity Assessment Lower Extremity Assessment: Overall WFL for tasks assessed    Cervical / Trunk Assessment Cervical / Trunk Assessment: Normal  Communication   Communication: No difficulties  Cognition Arousal/Alertness: Awake/alert Behavior During Therapy: WFL for tasks assessed/performed Overall Cognitive Status: Within Functional Limits for tasks assessed  General Comments      Exercises     Assessment/Plan    PT Assessment Patent does not need any further PT services  PT Problem List         PT Treatment Interventions      PT Goals (Current goals can be found in the Care Plan section)  Acute Rehab PT Goals Patient Stated Goal: return home PT Goal Formulation: With patient Time For Goal Achievement: 08/18/19 Potential to Achieve Goals: Good    Frequency     Barriers  to discharge        Co-evaluation               AM-PAC PT "6 Clicks" Mobility  Outcome Measure Help needed turning from your back to your side while in a flat bed without using bedrails?: None Help needed moving from lying on your back to sitting on the side of a flat bed without using bedrails?: None Help needed moving to and from a bed to a chair (including a wheelchair)?: None Help needed standing up from a chair using your arms (e.g., wheelchair or bedside chair)?: None Help needed to walk in hospital room?: None Help needed climbing 3-5 steps with a railing? : None 6 Click Score: 24    End of Session   Activity Tolerance: Patient tolerated treatment well Patient left: in chair;with call bell/phone within reach Nurse Communication: Mobility status PT Visit Diagnosis: Unsteadiness on feet (R26.81);Other abnormalities of gait and mobility (R26.89);Muscle weakness (generalized) (M62.81)    Time: [id number] PT Time Calculation (min) (ACUTE ONLY): 21 min   Charges:   PT Evaluation $PT Eval Moderate Complexity: 1 Mod PT Treatments $Gait Training: 8-22 mins        8:34 AM, 08/18/19 Lonell Grandchild, MPT Physical Therapist with [facility] 336 [phone] office [phone] mobile phone

## 2019-08-18 NOTE — Evaluation (Signed)
Occupational Therapy Evaluation Patient Details Name: Robert Osborne MRN: [id number] DOB: [birth year] Today's Date: 08/18/2019    History of Present Illness Robert Osborne is a 73 y.o. male with medical history significant for hypertension, diabetes mellitus, bladder cancer.  Patient presented to the ED with complaints of generalized weakness over the past several weeks, but over the past 2 days weakness has worsened, with difficulty walking.  Per notes patient spouse reported shuffling of his feet.  Patient saw his primary care provider with concern for stroke was sent to the ED. MRI negative for acute infarct.    Clinical Impression   Pt agreeable to OT evaluation, reports he feels like he is not walking normally. Pt demonstrating BUE strength WFL, coordination and sensation are intact. Pt performing standing ADLs at sink without difficulty. During functional mobility pt taking short strides, no LOB during session. Pt appears to be at baseline for ADL completion, no further OT services required at this time.      Follow Up Recommendations  No OT follow up    Equipment Recommendations  Tub/shower seat       Precautions / Restrictions Precautions Precautions: None Restrictions Weight Bearing Restrictions: No      Mobility Bed Mobility Overal bed mobility: Modified Independent                Transfers Overall transfer level: Modified independent Equipment used: None                      ADL either performed or assessed with clinical judgement   ADL Overall ADL's : Needs assistance/impaired     Grooming: Wash/dry hands;Wash/dry face;Supervision/safety;Standing Grooming Details (indicate cue type and reason): No difficulty with tasks, using BUE for tasks, no support for standing at sink                 Toilet Transfer: Supervision/safety;Regular Glass blower/designer Details (indicate cue type and reason): simulated with chair, no difficulty          Functional mobility during ADLs: Supervision/safety       Vision Baseline Vision/History: No visual deficits Patient Visual Report: Blurring of vision Vision Assessment?: Yes Eye Alignment: Within Functional Limits Ocular Range of Motion: Within Functional Limits Alignment/Gaze Preference: Within Defined Limits Tracking/Visual Pursuits: Able to track stimulus in all quads without difficulty Saccades: Within functional limits Convergence: Within functional limits Visual Fields: No apparent deficits            Pertinent Vitals/Pain Pain Assessment: No/denies pain     Hand Dominance Right   Extremity/Trunk Assessment Upper Extremity Assessment Upper Extremity Assessment: Overall WFL for tasks assessed(BUE grossly 4/5)   Lower Extremity Assessment Lower Extremity Assessment: Defer to PT evaluation   Cervical / Trunk Assessment Cervical / Trunk Assessment: Normal   Communication Communication Communication: No difficulties   Cognition Arousal/Alertness: Awake/alert Behavior During Therapy: WFL for tasks assessed/performed Overall Cognitive Status: Within Functional Limits for tasks assessed                                                Home Living Family/patient expects to be discharged to:: Private residence Living Arrangements: Spouse/significant other Available Help at Discharge: Family;Available 24 hours/day Type of Home: House Home Access: Stairs to enter [employer] of Steps: 2 Entrance Stairs-Rails: None Home Layout: One level  Bathroom Shower/Tub: Occupational psychologist: Standard     Home Equipment: Cane - single point;Grab bars - tub/shower          Prior Functioning/Environment Level of Independence: Independent        Comments: Pt drives, landscapes, independent with mobility and ADLs        OT Problem List: Decreased activity tolerance       AM-PAC OT "6 Clicks" Daily Activity      Outcome Measure Help from another person eating meals?: None Help from another person taking care of personal grooming?: None Help from another person toileting, which includes using toliet, bedpan, or urinal?: None Help from another person bathing (including washing, rinsing, drying)?: None Help from another person to put on and taking off regular upper body clothing?: None Help from another person to put on and taking off regular lower body clothing?: None 6 Click Score: 24   End of Session    Activity Tolerance: Patient tolerated treatment well Patient left: in chair;with call bell/phone within reach  OT Visit Diagnosis: Muscle weakness (generalized) (M62.81)                Time: [id number] OT Time Calculation (min): 16 min Charges:  OT General Charges $OT Visit: 1 Visit OT Evaluation $OT Eval Low Complexity: 1 Low   Guadelupe Sabin, OTR/L  [phone] 08/18/2019, 7:57 AM

## 2019-08-18 NOTE — Care Management Obs Status (Signed)
[facility] NOTIFICATION   Patient Details  Name: Robert Osborne MRN: [id number] Date of Birth: 29-Oct-1946   Medicare Observation Status Notification Given:  Yes    Ihor Gully, LCSW 08/18/2019, 3:17 PM

## 2019-08-18 NOTE — Consult Note (Addendum)
Cardiology Consult    Patient ID: FUE CERVENKA; [id number]; 02/01/1946   Admit date: 08/17/2019 Date of Consult: 08/18/2019  Primary Care Provider: Sharion Balloon, FNP Primary Cardiologist: Minus Breeding, MD   Patient Profile    NYRON MOZER is a 73 y.o. male with past medical history of HTN, HLD, Type 2 DM, and history of bladder cancer who is being seen today for the evaluation of chest pain at the request of Dr. Denton Brick.   History of Present Illness    Mr. Mangieri was last examined by Dr. Percival Spanish in 07/2018 and reported being very active at baseline and denied any recent chest pain or dyspnea on exertion. He had previously undergone a POET in 2017 which showed ST depression and a perfusion study was recommended but he never responded. By review of Dr. Rosezella Florida notes, he reviewed the results at the time of his office visit and did not see any significant ischemic changes. No changes were made to his medication regimen and he was informed to follow-up as needed.  He was examined by his PCP yesterday and reported a more shuffling gait along with his wife reporting he was confused. He had facial droop on examination and decreased strength on left side, therefore emergent transport to [facility] ED was recommended given concerns for acute CVA. Labs showed WBC 5.7, Hgb 15.0, platelets 220, Na+ 141, K+ 4.0, and creatinine 1.03. Mg 1.9. COVID testing negative. Initial and delta HS Troponin values negative at 3.0. EKG showed NSR, HR 67, with LAD and T-wave flattening along V4-V6. CXR showed no acute cardiopulmonary process. CT Head with no acute abnormalities. Did have atrophy and chronic microvascular ischemic changes. MRI Brain and Neck showed no acute intracranial infarct or other abnormality but did have moderately advanced cerebral atrophy with chronic microvascular ischemic disease and multiple remote lacunar infarcts involving the hemispheric cerebral white matter and deep gray nuclei.  Also had multiple scattered chronic micro hemorrhages clustered about the deep gray nuclei and cerebellum, most consistent with underlying chronic hypertensive encephalopathy.   Neurology has been consulted with further recommendations pending. On admission he did report episodes of chest pain over the past few weeks which led to Cardiology consult.  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 He denies any associated chest pain or dyspnea when mowing or using the weedeater. Says that he has experienced dizziness and headaches for the past few weeks. He has undergone multiple oral surgeries and has been consuming mostly soft foods and a liquid diet. Says he has lost 15 pounds within the past month due to this.  He does describe having a "sensation" along his chest which typically occurs in the morning hours upon waking. This resolves within a few minutes without any intervention.  He denies any specific orthopnea, PND, lower extremity edema, or palpitations.    Past Medical History:  Diagnosis Date   Bladder cancer ([place]) 08/18/2013   Cancer ([place])    papillary urethral carcinoma,low grade, non-invasive   Diabetes mellitus without complication ([place])    Erectile dysfunction 10/24/2016   Hyperlipidemia associated with type 2 diabetes mellitus ([place]) 05/17/2013   Hypertension associated with diabetes ([place])    Hypokalemia 11/27/2017   Hypomagnesemia 11/27/2017   Major frontotemporal neurocognitive disorder, probable, with behavioral disturbance (  [place]) 11/27/2017   Vitamin D deficiency 05/17/2013    Past Surgical History:  Procedure Laterality Date   COLONOSCOPY W/ POLYPECTOMY       Home Medications:  Prior to Admission medications   Medication Sig Start Date End Date Taking? Authorizing  Provider  amLODipine (NORVASC) 10 MG tablet Take 1 tablet (10 mg total) by mouth daily. 06/16/19  Yes Hawks, Alyse Low A, FNP  atorvastatin (LIPITOR) 40 MG tablet Take 1 tablet by mouth once daily 06/15/19  Yes Hawks, Seagraves A, FNP  Cholecalciferol (VITAMIN D-3) 5000 UNITS TABS Take 1,000 Int'l Units by mouth daily.    Yes [provider]  losartan (COZAAR) 25 MG tablet Take 1 tablet (25 mg total) by mouth daily. 08/11/18 08/17/19 Yes Minus Breeding, MD  metFORMIN (GLUCOPHAGE XR) 750 MG 24 hr tablet Take 2 tablets (1,500 mg total) by mouth daily with breakfast. 08/02/19 08/01/20 Yes Hawks, Christy A, FNP  OLANZapine (ZYPREXA) 2.5 MG tablet Take 2.5 mg by mouth at bedtime.   Yes [provider]  vitamin B-12 (CYANOCOBALAMIN) 1000 MCG tablet Take 1,000 mcg by mouth daily.   Yes [provider]  blood glucose meter kit and supplies KIT Dispense based on patient and insurance preference. Use up to twice a daily as directed. (FOR ICD-10 - type 2 DM uncontrolled E11.9) Patient not taking: Reported on 08/17/2019 06/08/17   Cherre Robins, PharmD  blood glucose meter kit and supplies Dispense based on patient and insurance preference. Use up to four times daily as directed. (FOR ICD-10 E10.9, E11.9). Patient not taking: Reported on 08/17/2019 08/09/19   Evelina Dun A, FNP  insulin degludec (TRESIBA FLEXTOUCH) 100 UNIT/ML SOPN FlexTouch Pen Inject 0.05 mLs (5 Units total) into the skin daily. Patient not taking: Reported on 08/17/2019 08/10/19   Evelina Dun A, FNP  potassium chloride SA (K-DUR) 20 MEQ tablet Take 1 tablet (20 mEq total) by mouth daily. Patient not taking: Reported on 08/17/2019 08/10/19   Sharion Balloon, FNP    Inpatient Medications: Scheduled Meds:  amLODipine  10 mg Oral Daily   aspirin  325 mg Oral Daily   atorvastatin  40 mg Oral q1800   enoxaparin (LOVENOX) injection  40 mg Subcutaneous Q24H   insulin aspart  0-9 Units Subcutaneous TID WC   losartan  25 mg  Oral Daily   OLANZapine  2.5 mg Oral QHS   Continuous Infusions:  PRN Meds: acetaminophen **OR** acetaminophen, ondansetron **OR** ondansetron (ZOFRAN) IV, polyethylene glycol  Allergies:    Allergies  Allergen Reactions   Enalapril Cough    Social History:   Social History   Socioeconomic History   Marital status: Married    Spouse name: Not on file   Number of children: 3   Years of education: Not on file   Highest education level: Not on file  Occupational History   Not on file  Social Needs   Financial resource strain: Not on file   Food insecurity    Worry: Not on file    Inability: Not on file   Transportation needs    Medical: Not on file    Non-medical: Not on file  Tobacco Use   Smoking status: Former Smoker    Quit date: 05/17/1973    Years since quitting: 46.2   Smokeless tobacco: Never Used  Substance and Sexual Activity   Alcohol use: No   Drug use: No   Sexual activity: Not on file  Lifestyle   Physical activity  Days per week: Not on file    Minutes per session: Not on file   Stress: Not on file  Relationships   Social connections    Talks on phone: Not on file    Gets together: Not on file    Attends religious service: Not on file    Active member of club or organization: Not on file    Attends meetings of clubs or organizations: Not on file    Relationship status: Not on file   Intimate partner violence    Fear of current or ex partner: Not on file    Emotionally abused: Not on file    Physically abused: Not on file    Forced sexual activity: Not on file  Other Topics Concern   Not on file  Social History Narrative   Still works as Development worker, [employer].        Family History:    Family History  Problem Relation Age of Onset   Heart attack Maternal Grandmother    Stroke Maternal Grandfather    Heart attack Mother 85      Review of Systems    General:  No chills, fever, night sweats or weight changes.    Cardiovascular:  No dyspnea on exertion, edema, orthopnea, palpitations, paroxysmal nocturnal dyspnea. Positive for chest pain.  Dermatological: No rash, lesions/masses Respiratory: No cough, dyspnea Urologic: No hematuria, dysuria Abdominal:   No nausea, vomiting, diarrhea, bright red blood per rectum, melena, or hematemesis Neurologic:  No visual changes or changes in mental status. Positive for weakness and facial droop.   All other systems reviewed and are otherwise negative except as noted above.  Physical Exam/Data    Vitals:   08/17/19 1824 08/17/19 1921 08/17/19 2111 08/18/19 0435  BP: (!) 154/83  139/71 (!) 149/80  Pulse: 65  (!) 53 (!) 54  Resp: _0 Temp: 98.4 F (36.9 C)  (!) 97.4 F (36.3 C) 98.5 F (36.9 C)  TempSrc: Oral  Oral Oral  SpO2: 98% 97% 98% 96%  Weight: 68.4 kg     Height: _1  (1.702 m)       Intake/Output Summary (Last 24 hours) at 08/18/2019 0755 Last data filed at 08/17/2019 1957 Gross per 24 hour  Intake 240 ml  Output --  Net 240 ml   Filed Weights   08/17/19 1824  Weight: 68.4 kg   Body mass index is 23.62 kg/m.   General: Pleasant male appearing in NAD Psych: Normal affect. Neuro: Alert and oriented X 3. Moves all extremities spontaneously. HEENT: Normal  Neck: Supple without bruits or JVD. Lungs:  Resp regular and unlabored, CTA without wheezing or rales. Heart: RRR no s3, s4, or murmurs. Abdomen: Soft, non-tender, non-distended, BS + x 4.  Extremities: No clubbing, cyanosis or lower extremity edema. DP/PT/Radials 2+ and equal bilaterally.   EKG:  The EKG was personally reviewed and demonstrates: NSR, HR 67, with LAD and T-wave flattening along V4-V6.  Telemetry:  Telemetry was personally reviewed and demonstrates: Sinus bradycardia, HR in 50's to 60's. No significant arrhythmias.    Labs/Studies     Relevant CV Studies:  ETT: 02/2016  Horizontal ST segment depression ST segment depression of 2 mm was noted during  stress in the II, III, aVF, V5 and V6 leads, and returning to baseline after less than 1 minute of recovery.  The patient was asymptomatic during the study  [facility] treadmill score of -4, suggesting intermediate risk for major cardiac events  Consider stress imaging to further risk stratify if clinically indicated  Laboratory Data:  Chemistry Recent Labs  Lab 08/17/19 1317  NA 141  K 4.0  CL 104  CO2 27  GLUCOSE 276*  BUN 17  CREATININE 1.03  CALCIUM 9.1  GFRNONAA >60  GFRAA >60  ANIONGAP 10    No results for input(s): PROT, ALBUMIN, AST, ALT, ALKPHOS, BILITOT in the last 168 hours. Hematology Recent Labs  Lab 08/17/19 1317  WBC 5.7  RBC 5.05  HGB 15.0  HCT 44.4  MCV 87.9  MCH 29.7  MCHC 33.8  RDW 12.5  PLT 220   Cardiac EnzymesNo results for input(s): TROPONINI in the last 168 hours. No results for input(s): TROPIPOC in the last 168 hours.  BNPNo results for input(s): BNP, PROBNP in the last 168 hours.  DDimer No results for input(s): DDIMER in the last 168 hours.  Radiology/Studies:  Dg Chest 2 View  Result Date: 08/17/2019 CLINICAL DATA:  Per Triage Note: Patient reports progressive weakness that started several days ago. States he is having difficulty walking. Sent by Josie Saunders for eval for possible stroke.generalized weakness EXAM: CHEST - 2 VIEW COMPARISON:  01/05/2017 FINDINGS: Normal cardiac silhouette with ectatic aorta. Lungs are mildly hyperinflated. No effusion, infiltrate pneumothorax. Remote RIGHT rib fracture. IMPRESSION: No acute cardiopulmonary process. Electronically Signed   By: Suzy Bouchard M.D.   On: 08/17/2019 14:55   Ct Head Wo Contrast  Result Date: 08/17/2019 CLINICAL DATA:  Progressive weakness developing over the past several days. EXAM: CT HEAD WITHOUT CONTRAST TECHNIQUE: Contiguous axial images were obtained from the base of the skull through the vertex without intravenous contrast. COMPARISON:  Head CT scan 11/20/2017.  FINDINGS: Brain: No evidence of acute infarction, hemorrhage, hydrocephalus, extra-axial collection or mass lesion/mass effect. Atrophy and chronic microvascular ischemic change noted. Vascular: No hyperdense vessel or unexpected calcification. Skull: Intact.  No focal lesion. Sinuses/Orbits: Negative. Other: None. IMPRESSION: No acute abnormality. Atrophy and chronic microvascular ischemic change. Electronically Signed   By: Inge Rise M.D.   On: 08/17/2019 14:26   Mr Angio Head Wo Contrast  Result Date: 08/17/2019 CLINICAL DATA:  Initial evaluation for acute left-sided facial droop and left-sided weakness for 2 days. EXAM: MRI HEAD WITHOUT CONTRAST MRA HEAD WITHOUT CONTRAST MRA NECK WITHOUT AND WITH CONTRAST TECHNIQUE: Multiplanar, multiecho pulse sequences of the brain and surrounding structures were obtained without intravenous contrast. Angiographic images of the Circle of Willis were obtained using MRA technique without intravenous contrast. Angiographic images of the neck were obtained using MRA technique without and with intravenous contrast. Carotid stenosis measurements (when applicable) are obtained utilizing NASCET criteria, using the distal internal carotid diameter as the denominator. CONTRAST:  7.5 cc of Gadavist. COMPARISON:  Prior head CT from earlier the same day. FINDINGS: MRI HEAD FINDINGS Brain: Diffuse prominence of the CSF containing spaces compatible with generalized age-related cerebral atrophy. Patchy and confluent T2/FLAIR hyperintensity within the periventricular deep white matter both cerebral hemispheres most consistent with chronic small vessel ischemic disease, moderate nature. Multiple scattered superimposed remote lacunar infarcts seen involving the hemispheric cerebral white matter bilaterally, with additional remote lacunar infarcts involving the bilateral basal ganglia and left thalamus. No abnormal foci of restricted diffusion to suggest acute or subacute ischemia.  Gray-white matter differentiation maintained. No areas of remote cortical infarction. No acute intracranial hemorrhage. Multiple scattered chronic micro hemorrhages seen predominantly clustered about the deep gray nuclei and cerebellum, most likely related to chronic poorly controlled hypertension. No mass lesion,  midline shift or mass effect. Of hydrocephalus. No extra-axial fluid collection. Pituitary gland suprasellar region normal. Midline structures intact. Ventricular prominence related to global parenchymal volume loss Vascular: Major intracranial vascular flow voids grossly maintained at the skull base. Skull and upper cervical spine: Craniocervical junction within normal limits. Visualized upper cervical spine normal. Bone marrow signal intensity within normal limits. No scalp soft tissue abnormality. Sinuses/Orbits: Globes and orbital soft tissues demonstrate no acute finding. Mild scattered mucosal thickening noted throughout the paranasal sinuses. No air-fluid level to suggest acute sinusitis. No mastoid effusion. Inner ear structures grossly normal. Other: None. MRA HEAD FINDINGS ANTERIOR CIRCULATION: Examination technically limited by motion. Distal cervical segments of the internal carotid arteries are widely patent with symmetric antegrade flow. Petrous, cavernous, and supraclinoid segments widely patent without flow-limiting stenosis. A1 segments patent bilaterally. Normal anterior communicating artery complex. Anterior cerebral arteries patent to their distal aspects without flow-limiting stenosis. No M1 stenosis or occlusion. Normal MCA bifurcations. Distal MCA branches well perfused and symmetric. POSTERIOR CIRCULATION: Evaluation of the vertebral arteries mildly limited by artifact on this exam. Vertebral arteries patent as they course into the cranial vault, and are grossly patent to the vertebrobasilar junction without flow-limiting stenosis. Focal signal loss within the distal left V4  segment, consistent with artifact as this appears patent on corresponding MRA of the neck. Normal flow void seen within this region on corresponding MRI. Left PICA patent. Right PICA not visualized. Basilar tortuous with short-segment fenestration at its proximal aspect, best seen on corresponding MRA of the neck. Short-segment mild stenosis at the distal basilar (series 1, image 75). Basilar otherwise widely patent. Superior cerebral arteries patent proximally. Left PCA primarily supplied via the basilar. Hypoplastic right P1 segment with robust right posterior communicating artery supplies the right PCA. Right PCA perfused to its distal aspect without stenosis. Left PCA patent proximally, not well seen distally, most consistent with artifact as this appears patent on corresponding MRA of the neck. No intracranial aneurysm. MRA NECK FINDINGS Source images reviewed. Visualized aortic arch of normal caliber with normal 3 vessel morphology. No hemodynamically significant stenosis seen about the origin of the great vessels. Visualized subclavian arteries widely patent. Right CCA tortuous proximally but widely patent to the bifurcation without stenosis. No significant atheromatous narrowing seen about the right bifurcation. Right ICA tortuous but widely patent to the skull base without stenosis, dissection or occlusion. Left CCA tortuous proximally but widely patent to the bifurcation without stenosis. Minor atheromatous change about the proximal left ICA without hemodynamically significant stenosis. Left ICA tortuous distally but widely patent to the skull base without stenosis, dissection or occlusion. Both vertebral arteries arise from the subclavian arteries. Origin of the vertebral arteries not well assessed due to motion. Visualized vertebral arteries widely patent within the neck without stenosis, dissection or occlusion. Right vertebral artery slightly dominant. IMPRESSION: MRI HEAD IMPRESSION: 1. No acute  intracranial infarct or other abnormality identified. 2. Moderately advanced cerebral atrophy with chronic microvascular ischemic disease, with multiple remote lacunar infarcts involving the hemispheric cerebral white matter and deep gray nuclei. 3. Multiple scattered chronic micro hemorrhages clustered about the deep gray nuclei and cerebellum, most consistent with underlying chronic hypertensive encephalopathy. MRA HEAD IMPRESSION: 1. Negative intracranial MRA for large vessel occlusion. 2. Short-segment mild stenosis involving the distal basilar artery. No other hemodynamically significant or proximal correctable stenosis identified. 3. No intracranial aneurysm. MRA NECK IMPRESSION: 1. Wide patency of both carotid artery systems within the neck. No hemodynamically significant stenosis or other acute  vascular abnormality. 2. Wide patency of both vertebral arteries within the neck. 3. Diffuse tortuosity of the major arterial vasculature of the neck, suggesting chronic underlying hypertension. Electronically Signed   By: Jeannine Boga M.D.   On: 08/17/2019 20:33   Mr Angio Neck W Wo Contrast  Result Date: 08/17/2019 CLINICAL DATA:  Initial evaluation for acute left-sided facial droop and left-sided weakness for 2 days. EXAM: MRI HEAD WITHOUT CONTRAST MRA HEAD WITHOUT CONTRAST MRA NECK WITHOUT AND WITH CONTRAST TECHNIQUE: Multiplanar, multiecho pulse sequences of the brain and surrounding structures were obtained without intravenous contrast. Angiographic images of the Circle of Willis were obtained using MRA technique without intravenous contrast. Angiographic images of the neck were obtained using MRA technique without and with intravenous contrast. Carotid stenosis measurements (when applicable) are obtained utilizing NASCET criteria, using the distal internal carotid diameter as the denominator. CONTRAST:  7.5 cc of Gadavist. COMPARISON:  Prior head CT from earlier the same day. FINDINGS: MRI HEAD  FINDINGS Brain: Diffuse prominence of the CSF containing spaces compatible with generalized age-related cerebral atrophy. Patchy and confluent T2/FLAIR hyperintensity within the periventricular deep white matter both cerebral hemispheres most consistent with chronic small vessel ischemic disease, moderate nature. Multiple scattered superimposed remote lacunar infarcts seen involving the hemispheric cerebral white matter bilaterally, with additional remote lacunar infarcts involving the bilateral basal ganglia and left thalamus. No abnormal foci of restricted diffusion to suggest acute or subacute ischemia. Gray-white matter differentiation maintained. No areas of remote cortical infarction. No acute intracranial hemorrhage. Multiple scattered chronic micro hemorrhages seen predominantly clustered about the deep gray nuclei and cerebellum, most likely related to chronic poorly controlled hypertension. No mass lesion, midline shift or mass effect. Of hydrocephalus. No extra-axial fluid collection. Pituitary gland suprasellar region normal. Midline structures intact. Ventricular prominence related to global parenchymal volume loss Vascular: Major intracranial vascular flow voids grossly maintained at the skull base. Skull and upper cervical spine: Craniocervical junction within normal limits. Visualized upper cervical spine normal. Bone marrow signal intensity within normal limits. No scalp soft tissue abnormality. Sinuses/Orbits: Globes and orbital soft tissues demonstrate no acute finding. Mild scattered mucosal thickening noted throughout the paranasal sinuses. No air-fluid level to suggest acute sinusitis. No mastoid effusion. Inner ear structures grossly normal. Other: None. MRA HEAD FINDINGS ANTERIOR CIRCULATION: Examination technically limited by motion. Distal cervical segments of the internal carotid arteries are widely patent with symmetric antegrade flow. Petrous, cavernous, and supraclinoid segments widely  patent without flow-limiting stenosis. A1 segments patent bilaterally. Normal anterior communicating artery complex. Anterior cerebral arteries patent to their distal aspects without flow-limiting stenosis. No M1 stenosis or occlusion. Normal MCA bifurcations. Distal MCA branches well perfused and symmetric. POSTERIOR CIRCULATION: Evaluation of the vertebral arteries mildly limited by artifact on this exam. Vertebral arteries patent as they course into the cranial vault, and are grossly patent to the vertebrobasilar junction without flow-limiting stenosis. Focal signal loss within the distal left V4 segment, consistent with artifact as this appears patent on corresponding MRA of the neck. Normal flow void seen within this region on corresponding MRI. Left PICA patent. Right PICA not visualized. Basilar tortuous with short-segment fenestration at its proximal aspect, best seen on corresponding MRA of the neck. Short-segment mild stenosis at the distal basilar (series 1, image 75). Basilar otherwise widely patent. Superior cerebral arteries patent proximally. Left PCA primarily supplied via the basilar. Hypoplastic right P1 segment with robust right posterior communicating artery supplies the right PCA. Right PCA perfused to its distal aspect without  stenosis. Left PCA patent proximally, not well seen distally, most consistent with artifact as this appears patent on corresponding MRA of the neck. No intracranial aneurysm. MRA NECK FINDINGS Source images reviewed. Visualized aortic arch of normal caliber with normal 3 vessel morphology. No hemodynamically significant stenosis seen about the origin of the great vessels. Visualized subclavian arteries widely patent. Right CCA tortuous proximally but widely patent to the bifurcation without stenosis. No significant atheromatous narrowing seen about the right bifurcation. Right ICA tortuous but widely patent to the skull base without stenosis, dissection or occlusion. Left  CCA tortuous proximally but widely patent to the bifurcation without stenosis. Minor atheromatous change about the proximal left ICA without hemodynamically significant stenosis. Left ICA tortuous distally but widely patent to the skull base without stenosis, dissection or occlusion. Both vertebral arteries arise from the subclavian arteries. Origin of the vertebral arteries not well assessed due to motion. Visualized vertebral arteries widely patent within the neck without stenosis, dissection or occlusion. Right vertebral artery slightly dominant. IMPRESSION: MRI HEAD IMPRESSION: 1. No acute intracranial infarct or other abnormality identified. 2. Moderately advanced cerebral atrophy with chronic microvascular ischemic disease, with multiple remote lacunar infarcts involving the hemispheric cerebral white matter and deep gray nuclei. 3. Multiple scattered chronic micro hemorrhages clustered about the deep gray nuclei and cerebellum, most consistent with underlying chronic hypertensive encephalopathy. MRA HEAD IMPRESSION: 1. Negative intracranial MRA for large vessel occlusion. 2. Short-segment mild stenosis involving the distal basilar artery. No other hemodynamically significant or proximal correctable stenosis identified. 3. No intracranial aneurysm. MRA NECK IMPRESSION: 1. Wide patency of both carotid artery systems within the neck. No hemodynamically significant stenosis or other acute vascular abnormality. 2. Wide patency of both vertebral arteries within the neck. 3. Diffuse tortuosity of the major arterial vasculature of the neck, suggesting chronic underlying hypertension. Electronically Signed   By: Jeannine Boga M.D.   On: 08/17/2019 20:33   Mr Brain Wo Contrast  Result Date: 08/17/2019 CLINICAL DATA:  Initial evaluation for acute left-sided facial droop and left-sided weakness for 2 days. EXAM: MRI HEAD WITHOUT CONTRAST MRA HEAD WITHOUT CONTRAST MRA NECK WITHOUT AND WITH CONTRAST TECHNIQUE:  Multiplanar, multiecho pulse sequences of the brain and surrounding structures were obtained without intravenous contrast. Angiographic images of the Circle of Willis were obtained using MRA technique without intravenous contrast. Angiographic images of the neck were obtained using MRA technique without and with intravenous contrast. Carotid stenosis measurements (when applicable) are obtained utilizing NASCET criteria, using the distal internal carotid diameter as the denominator. CONTRAST:  7.5 cc of Gadavist. COMPARISON:  Prior head CT from earlier the same day. FINDINGS: MRI HEAD FINDINGS Brain: Diffuse prominence of the CSF containing spaces compatible with generalized age-related cerebral atrophy. Patchy and confluent T2/FLAIR hyperintensity within the periventricular deep white matter both cerebral hemispheres most consistent with chronic small vessel ischemic disease, moderate nature. Multiple scattered superimposed remote lacunar infarcts seen involving the hemispheric cerebral white matter bilaterally, with additional remote lacunar infarcts involving the bilateral basal ganglia and left thalamus. No abnormal foci of restricted diffusion to suggest acute or subacute ischemia. Gray-white matter differentiation maintained. No areas of remote cortical infarction. No acute intracranial hemorrhage. Multiple scattered chronic micro hemorrhages seen predominantly clustered about the deep gray nuclei and cerebellum, most likely related to chronic poorly controlled hypertension. No mass lesion, midline shift or mass effect. Of hydrocephalus. No extra-axial fluid collection. Pituitary gland suprasellar region normal. Midline structures intact. Ventricular prominence related to global parenchymal volume loss Vascular:  Major intracranial vascular flow voids grossly maintained at the skull base. Skull and upper cervical spine: Craniocervical junction within normal limits. Visualized upper cervical spine normal. Bone  marrow signal intensity within normal limits. No scalp soft tissue abnormality. Sinuses/Orbits: Globes and orbital soft tissues demonstrate no acute finding. Mild scattered mucosal thickening noted throughout the paranasal sinuses. No air-fluid level to suggest acute sinusitis. No mastoid effusion. Inner ear structures grossly normal. Other: None. MRA HEAD FINDINGS ANTERIOR CIRCULATION: Examination technically limited by motion. Distal cervical segments of the internal carotid arteries are widely patent with symmetric antegrade flow. Petrous, cavernous, and supraclinoid segments widely patent without flow-limiting stenosis. A1 segments patent bilaterally. Normal anterior communicating artery complex. Anterior cerebral arteries patent to their distal aspects without flow-limiting stenosis. No M1 stenosis or occlusion. Normal MCA bifurcations. Distal MCA branches well perfused and symmetric. POSTERIOR CIRCULATION: Evaluation of the vertebral arteries mildly limited by artifact on this exam. Vertebral arteries patent as they course into the cranial vault, and are grossly patent to the vertebrobasilar junction without flow-limiting stenosis. Focal signal loss within the distal left V4 segment, consistent with artifact as this appears patent on corresponding MRA of the neck. Normal flow void seen within this region on corresponding MRI. Left PICA patent. Right PICA not visualized. Basilar tortuous with short-segment fenestration at its proximal aspect, best seen on corresponding MRA of the neck. Short-segment mild stenosis at the distal basilar (series 1, image 75). Basilar otherwise widely patent. Superior cerebral arteries patent proximally. Left PCA primarily supplied via the basilar. Hypoplastic right P1 segment with robust right posterior communicating artery supplies the right PCA. Right PCA perfused to its distal aspect without stenosis. Left PCA patent proximally, not well seen distally, most consistent with  artifact as this appears patent on corresponding MRA of the neck. No intracranial aneurysm. MRA NECK FINDINGS Source images reviewed. Visualized aortic arch of normal caliber with normal 3 vessel morphology. No hemodynamically significant stenosis seen about the origin of the great vessels. Visualized subclavian arteries widely patent. Right CCA tortuous proximally but widely patent to the bifurcation without stenosis. No significant atheromatous narrowing seen about the right bifurcation. Right ICA tortuous but widely patent to the skull base without stenosis, dissection or occlusion. Left CCA tortuous proximally but widely patent to the bifurcation without stenosis. Minor atheromatous change about the proximal left ICA without hemodynamically significant stenosis. Left ICA tortuous distally but widely patent to the skull base without stenosis, dissection or occlusion. Both vertebral arteries arise from the subclavian arteries. Origin of the vertebral arteries not well assessed due to motion. Visualized vertebral arteries widely patent within the neck without stenosis, dissection or occlusion. Right vertebral artery slightly dominant. IMPRESSION: MRI HEAD IMPRESSION: 1. No acute intracranial infarct or other abnormality identified. 2. Moderately advanced cerebral atrophy with chronic microvascular ischemic disease, with multiple remote lacunar infarcts involving the hemispheric cerebral white matter and deep gray nuclei. 3. Multiple scattered chronic micro hemorrhages clustered about the deep gray nuclei and cerebellum, most consistent with underlying chronic hypertensive encephalopathy. MRA HEAD IMPRESSION: 1. Negative intracranial MRA for large vessel occlusion. 2. Short-segment mild stenosis involving the distal basilar artery. No other hemodynamically significant or proximal correctable stenosis identified. 3. No intracranial aneurysm. MRA NECK IMPRESSION: 1. Wide patency of both carotid artery systems within  the neck. No hemodynamically significant stenosis or other acute vascular abnormality. 2. Wide patency of both vertebral arteries within the neck. 3. Diffuse tortuosity of the major arterial vasculature of the neck, suggesting chronic underlying hypertension.  Electronically Signed   By: Jeannine Boga M.D.   On: 08/17/2019 20:33     Assessment & Plan    1. Atypical Chest Pain - Notes on admission had mentioned recent exertional chest pain but in talking with the patient today he reports being able to mow and use the weedeater without any chest pain or significant dyspnea on exertion but he does experience a "sensation" along his chest in the morning upon waking which resolves within several minutes and is not associated with exertion or positional changes.  - Initial and delta HS Troponin values negative at 3.0. EKG showed NSR, HR 67, with LAD and T-wave flattening along V4-V6. - Echocardiogram is pending to assess for any structural abnormalities. If no significant findings, would consider an outpatient Lexiscan Myoview given his multiple cardiac risk factors (HTN, HLD, Type 2 DM, family history of CAD and prior tobacco use) and reports of exertional symptoms but would make sure he is cleared from a neurological perspective prior to pursuing further ischemic evaluation.  2. HTN - BP has been variable at 139/71 - 163/86 since admission. Continue PTA Amlodipine and Losartan. Losartan could be further titrated to 50 mg daily if additional blood pressure control is needed.  3. HLD - Followed by PCP. FLP last month showed total cholesterol 148, triglycerides 111, HDL 52, and LDL 74.  Continue PTA Atorvastatin.  4. Facial Droop/ Lower Extremity Weakness -Reports lower extremity weakness over the past 2 to 3 weeks but denies any actual syncopal episodes.  He does report "stumbling" several times but denies any specific fall. MRI Brain and Neck showed no acute intracranial infarct or other  abnormality but did have moderately advanced cerebral atrophy with chronic microvascular ischemic disease and multiple remote lacunar infarcts involving the hemispheric cerebral white matter and deep gray nuclei. Also had multiple scattered chronic micro hemorrhages clustered about the deep gray nuclei and cerebellum, most consistent with underlying chronic hypertensive encephalopathy.  - Neurology has been consulted by the admitting team.    For questions or updates, please contact [place] Please consult [email or web address] for contact info under Cardiology/STEMI.  Signed, Erma Heritage, PA-C 08/18/2019, 7:55 AM Pager: [phone]    Attending note:  Patient seen and examined.  I reviewed his records and agree with above assessment by Ms. Strader PA-C.  Patient undergoing evaluation for lower extremity weakness and gait instability, neuroimaging shows evidence of remote lacunar infarcts, chronic microvascular ischemic changes and likely hypertensive encephalopathy.  Neurology consultation is pending.  We are consulted secondary to reported chest discomfort.  Patient describes a sense of fatigue over the last several months, a vague "light pain" in his chest sometimes when he breathes in, not necessarily with exertion.  He does have a history of hypertension and hyperlipidemia.  On examination this morning he appears comfortable.  He is afebrile, systolic blood pressure in the 130s to 140s, heart rate is in the 50s and sinus bradycardia by telemetry which I personally reviewed.  Lungs exhibit decreased breath sounds but no wheezing.  Cardiac exam reveals RRR without gallop.  Lab work shows LDL 76, high-sensitivity troponin I levels normal x2, SARS coronavirus 2 test negative, potassium 4.0, BUN 17, creatinine 1.03.  I personally reviewed his ECG which shows sinus rhythm with increased voltage and nonspecific ST-T changes.  Patient describes relative exertional fatigue and also atypical  chest pain in the setting of other potential neurological symptoms that are undergoing active work-up.  No evidence of ACS by high-sensitivity troponin  I levels and ECG is nonspecific.  We will review echocardiogram for cardiac structural assessment. Most likely plan will be to proceed with an outpatient Lexiscan Myoview once other issues are clarified.  Satira Sark, M.D., F.A.C.C.

## 2019-08-18 NOTE — Progress Notes (Signed)
  Echocardiogram 2D Echocardiogram has been performed.  Robert Osborne 08/18/2019, 10:08 AM

## 2019-08-19 ENCOUNTER — Encounter: Payer: Self-pay | Admitting: *Deleted

## 2019-08-19 ENCOUNTER — Observation Stay (HOSPITAL_COMMUNITY): Payer: Medicare HMO

## 2019-08-19 DIAGNOSIS — M50222 Other cervical disc displacement at C5-C6 level: Secondary | ICD-10-CM | POA: Diagnosis not present

## 2019-08-19 DIAGNOSIS — R29818 Other symptoms and signs involving the nervous system: Secondary | ICD-10-CM | POA: Diagnosis not present

## 2019-08-19 DIAGNOSIS — E119 Type 2 diabetes mellitus without complications: Secondary | ICD-10-CM | POA: Diagnosis not present

## 2019-08-19 DIAGNOSIS — M4802 Spinal stenosis, cervical region: Secondary | ICD-10-CM | POA: Diagnosis not present

## 2019-08-19 DIAGNOSIS — R29810 Facial weakness: Secondary | ICD-10-CM | POA: Diagnosis not present

## 2019-08-19 DIAGNOSIS — R531 Weakness: Secondary | ICD-10-CM | POA: Diagnosis not present

## 2019-08-19 LAB — GLUCOSE, CAPILLARY
Glucose-Capillary: 151 mg/dL — ABNORMAL HIGH (ref 70–99)
Glucose-Capillary: 226 mg/dL — ABNORMAL HIGH (ref 70–99)

## 2019-08-19 IMAGING — MR MR CERVICAL SPINE W/O CM
4 of 5 series · 21 of 48 positions shown · non-contrast
Comparison: None.

CLINICAL DATA: Ataxia.

EXAM:
MRI CERVICAL SPINE WITHOUT CONTRAST
TECHNIQUE: Multiplanar, multisequence MR imaging of the cervical spine was
performed. No intravenous contrast was administered.

[Series 4: T2 · sagittal · 3.0mm · 0.54mm/px · 6 of 17 slices shown (1 of 2)]
[im 1/17]
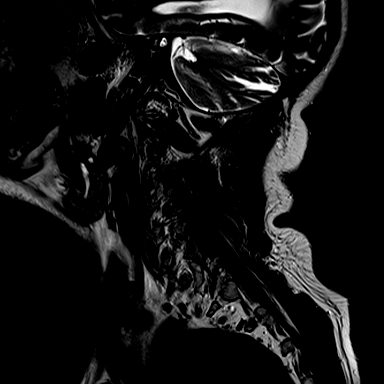
[im 4/17]
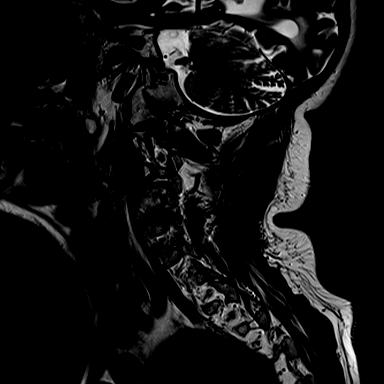
[im 7/17]
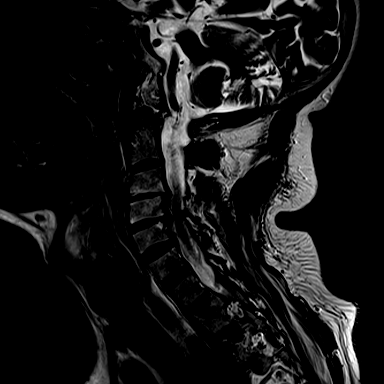
[im 10/17]
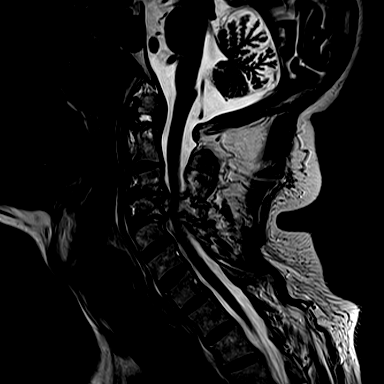
[im 13/17]
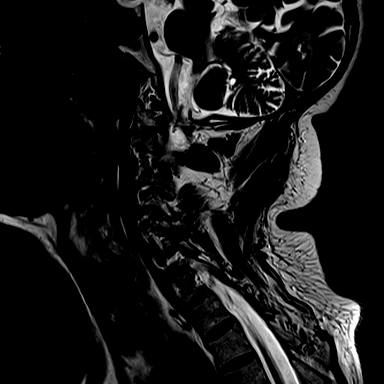
[im 17/17]
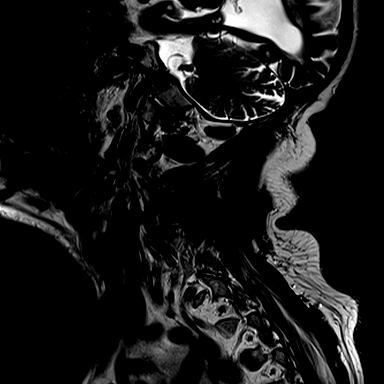

[Series 5: FLAIR · sagittal · 3.0mm · 0.65mm/px · 3 of 17 slices shown]
[im 3/17]
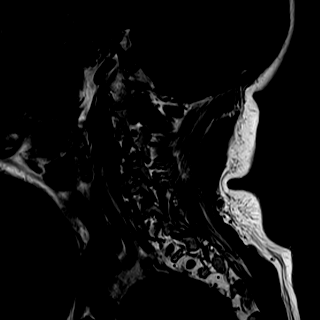
[im 9/17]
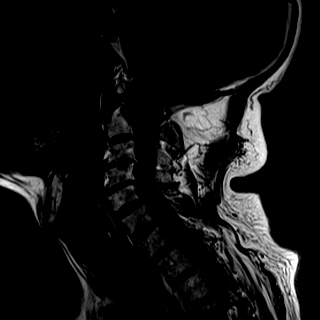
[im 14/17]
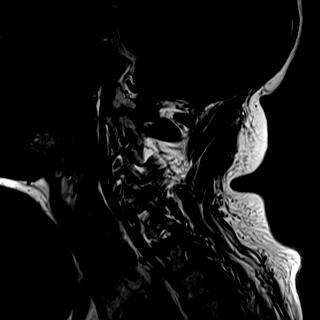

[Series 6: STIR · sagittal · 3.0mm · 0.33mm/px · 4 of 17 slices shown]
[im 1/17]
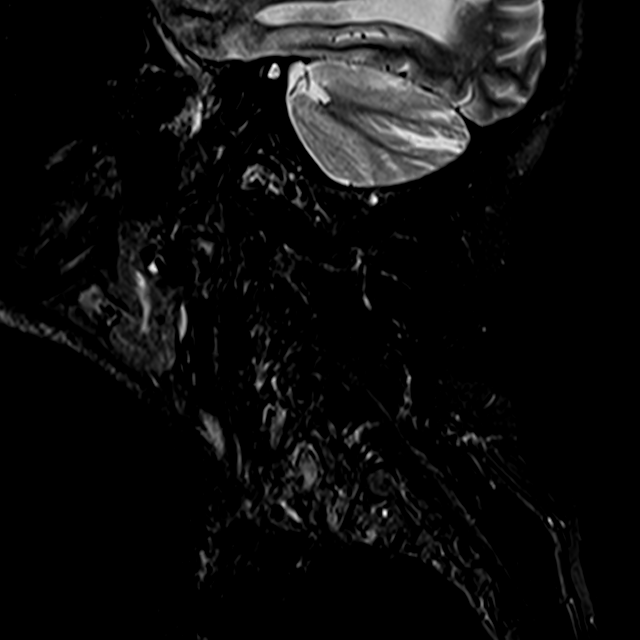
[im 3/17]
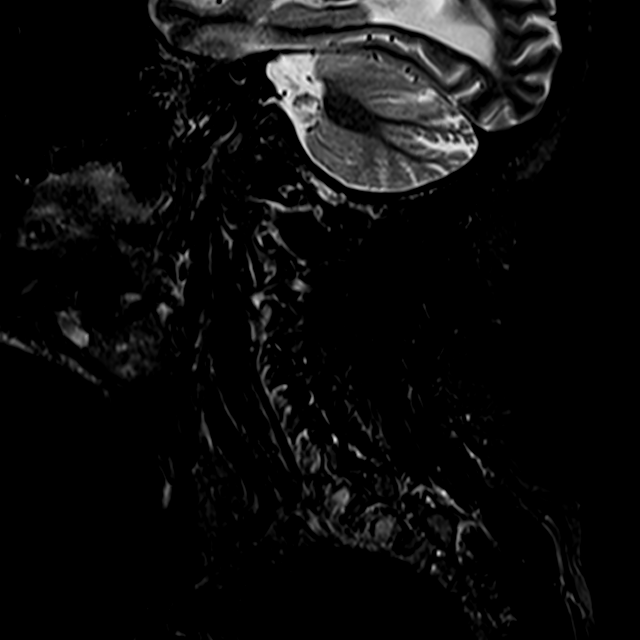
[im 9/17]
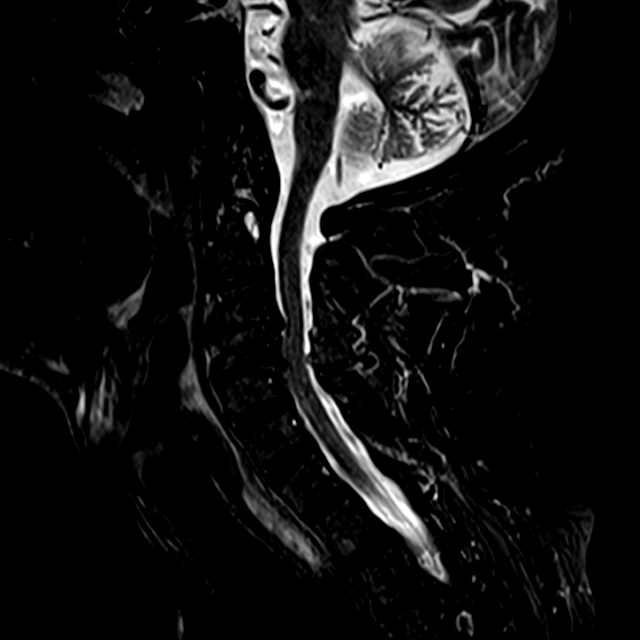
[im 14/17]
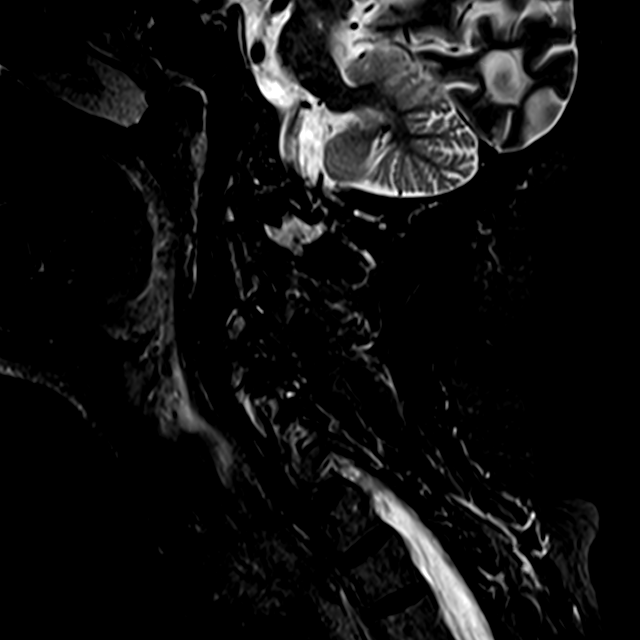

[Series 7: T2 · axial · 3.0mm · 0.26mm/px · z∈[-6,+99]mm · 8 of 36 slices shown (2 of 2)]
[im 1/36]
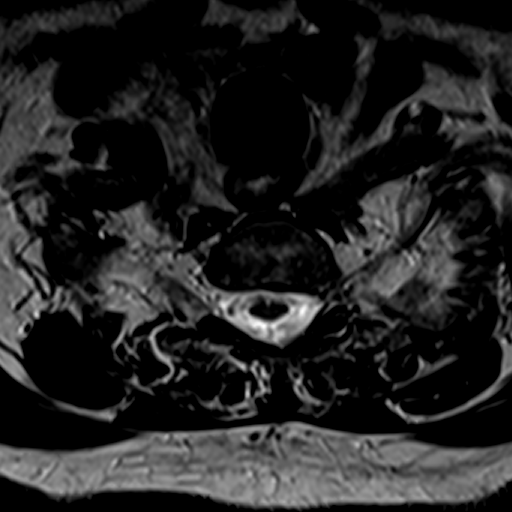
[im 6/36]
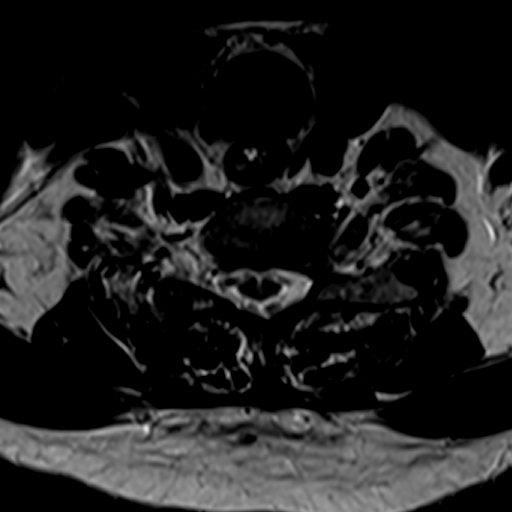
[im 11/36]
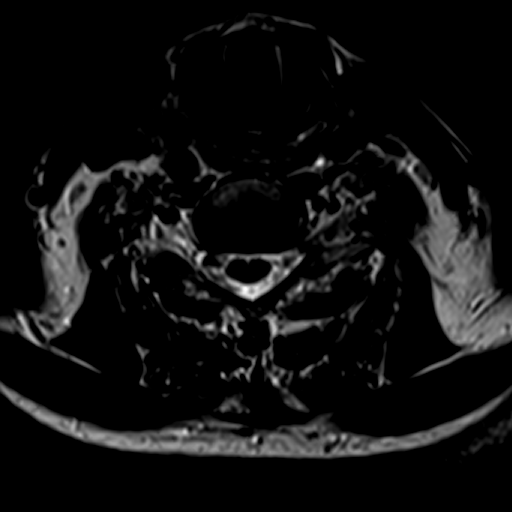
[im 17/36]
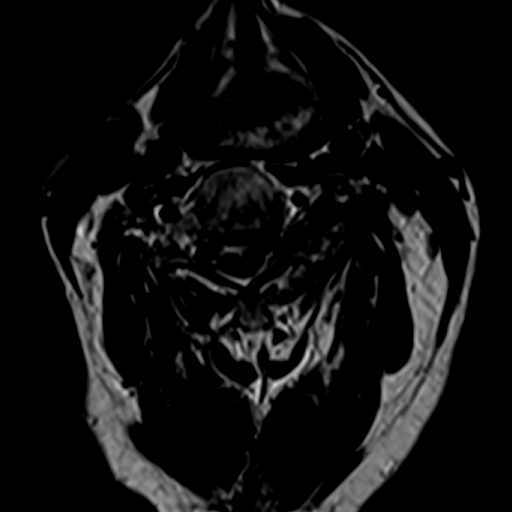
[im 19/36]
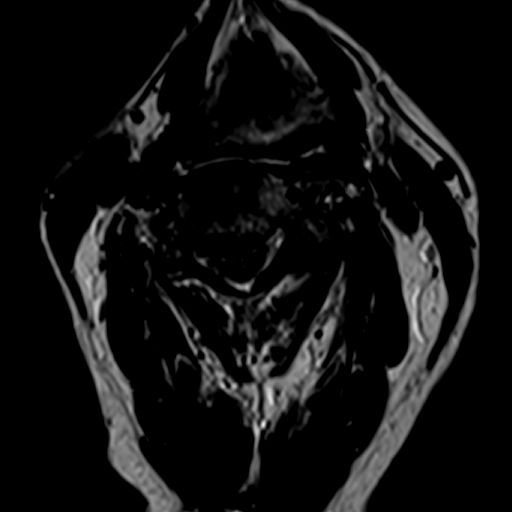
[im 25/36]
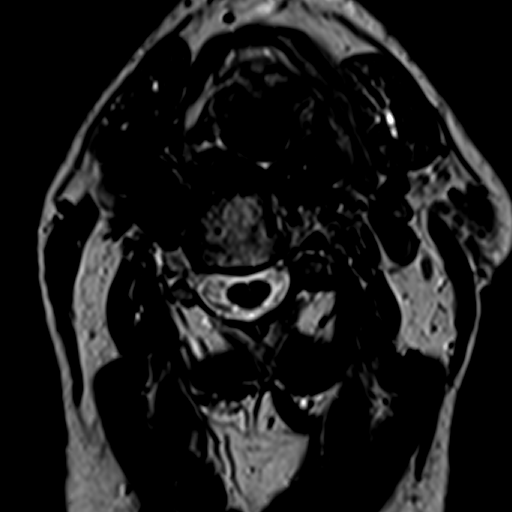
[im 30/36]
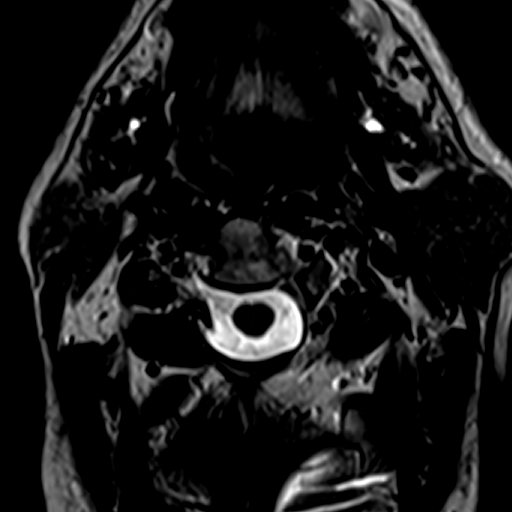
[im 36/36]
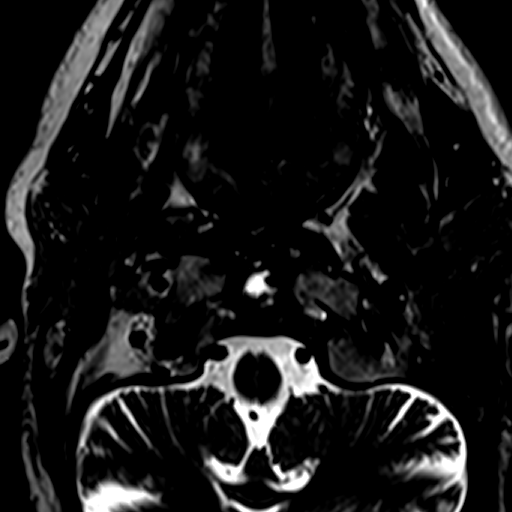

[21 of 48 positions shown; findings below may reference images not displayed]

FINDINGS: Alignment: Minimal retrolisthesis of C3 on C4 and of C4 on C5.

Vertebrae: There is an old nonunion fracture of the base of the
odontoid process. No acute abnormalities.

Cord: Normal signal and morphology.

Posterior Fossa, vertebral arteries, paraspinal tissues: Paraspinal
soft tissues are normal. Prominent cerebral atrophy.

Disc levels:

C2-3: Negative.

C3-4: Marked disc space narrowing. Small broad-based disc osteophyte
complex with slightly indents the ventral aspect of the spinal cord
without myelopathy. Slight encroachment upon the lateral recesses.
No significant foraminal stenosis.

C4-5: Disc space narrowing. Broad-based disc osteophyte complex
extending into the right neural foramen. No spinal cord compression.
Bilateral foraminal stenosis. Moderate left facet arthritis.

C5-6: Small central disc bulge without neural impingement.
Moderately severe left facet arthritis. No foraminal stenosis.

C6-7: Negative.

C7-T1: Negative.
IMPRESSION: 1. Old nonunion fracture of the odontoid process. No abnormality of
alignment.
2. Degenerative disc disease at C3-4 and C4-5 without focal neural
impingement.
3. No evidence myelopathy or significant compression of the cervical
spinal.

## 2019-08-19 MED ORDER — BLOOD GLUCOSE METER KIT: PACK | 0 refills | Status: DC

## 2019-08-19 MED ORDER — BLOOD GLUCOSE MONITOR KIT: PACK | 0 refills | Status: DC

## 2019-08-19 MED ORDER — ASPIRIN 325 MG PO TABS: 325.0000 mg | ORAL_TABLET | Freq: Every day | ORAL | Status: DC

## 2019-08-19 MED ORDER — POTASSIUM CHLORIDE CRYS ER 20 MEQ PO TBCR: 20.0000 meq | EXTENDED_RELEASE_TABLET | Freq: Every day | ORAL | 0 refills | Status: DC

## 2019-08-19 NOTE — Progress Notes (Signed)
.     Please refer to full consult note from 08/18/2019. Echocardiogram shows a preserved EF of 55-60% with no regional WMA. He has scheduled Cardiology follow-up in 3 weeks and will keep on the schedule at this time for reassessment. Pending further workup of his neurological symptoms this admission, would again discuss plans for outpatient Lexiscan Myoview if recurrent symptoms in the interim.   [facility] will sign off.   Medication Recommendations: No changes.  Other recommendations (labs, testing, etc): Possible outpatient stress test pending re-evelauation at hospital follow-up appointment.  Follow up as an outpatient: Appt listed on AVS.   Signed, Erma Heritage, PA-C 08/19/2019, 8:05 AM Pager: [phone]

## 2019-08-19 NOTE — Discharge Instructions (Signed)
Weakness Weakness is a lack of strength. You may feel weak all over your body (generalized), or you may feel weak in one part of your body (focal). There are many potential causes of weakness. Sometimes, the cause of your weakness may not be known. Some causes of weakness can be serious, so it is important to see your doctor. Follow these instructions at home: Activity  Rest as needed.  Try to get enough sleep. Most adults need 7-8 hours of sleep each night. Talk to your doctor about how much sleep you need each night.  Do exercises, such as arm curls and leg raises, for 30 minutes at least 2 days a week or as told by your doctor.  Think about working with a physical therapist or trainer to help you get stronger. General instructions   Take over-the-counter and prescription medicines only as told by your doctor.  Eat a healthy, well-balanced diet. This includes: ? Proteins to build muscles, such as lean meats and fish. ? Fresh fruits and vegetables. ? Carbohydrates to boost energy, such as whole grains.  Drink enough fluid to keep your pee (urine) pale yellow.  Keep all follow-up visits as told by your doctor. This is important. Contact a doctor if:  Your weakness does not get better or it gets worse.  Your weakness affects your ability to: ? Think clearly. ? Do your normal daily activities. Get help right away if you:  Have sudden weakness on one side of your face or body.  Have chest pain.  Have trouble breathing or shortness of breath.  Have problems with your vision.  Have trouble talking or swallowing.  Have trouble standing or walking.  Are light-headed.  Pass out (lose consciousness). Summary  Weakness is a lack of strength. You may feel weak all over your body or just in one part of your body.  There are many potential causes of weakness. Sometimes, the cause of your weakness may not be known.  Rest as needed, and try to get enough sleep. Most adults  need 7-8 hours of sleep each night.  Eat a healthy, well-balanced diet. This information is not intended to replace advice given to you by your health care provider. Make sure you discuss any questions you have with your health care provider. Document Released: 11/13/2008 Document Revised: 07/07/2018 Document Reviewed: 07/07/2018 Elsevier Patient Education  [place].  Blood Glucose Monitoring, Adult Monitoring your blood sugar (glucose) is an important part of managing your diabetes (diabetes mellitus). Blood glucose monitoring involves checking your blood glucose as often as directed and keeping a record (log) of your results over time. Checking your blood glucose regularly and keeping a blood glucose log can:  Help you and your health care provider adjust your diabetes management plan as needed, including your medicines or insulin.  Help you understand how food, exercise, illnesses, and medicines affect your blood glucose.  Let you know what your blood glucose is at any time. You can quickly find out if you have low blood glucose (hypoglycemia) or high blood glucose (hyperglycemia). Your health care provider will set individualized treatment goals for you. Your goals will be based on your age, other medical conditions you have, and how you respond to diabetes treatment. Generally, the goal of treatment is to maintain the following blood glucose levels:  Before meals (preprandial): 80-130 mg/dL (4.4-7.2 mmol/L).  After meals (postprandial): below 180 mg/dL (10 mmol/L).  A1c level: less than 7%. Supplies needed:  Blood glucose meter.  Test strips for your meter. Each meter has its own strips. You must use the strips that came with your meter.  A needle to prick your finger (lancet). Do not use a lancet more than one time.  A device that holds the lancet (lancing device).  A journal or log book to write down your results. How to check your blood glucose  1. Wash your  hands with soap and water. 2. Prick the side of your finger (not the tip) with the lancet. Use a different finger each time. 3. Gently rub the finger until a small drop of blood appears. 4. Follow instructions that come with your meter for inserting the test strip, applying blood to the strip, and using your blood glucose meter. 5. Write down your result and any notes. Some meters allow you to use areas of your body other than your finger (alternative sites) to test your blood. The most common alternative sites are:  Forearm.  Thigh.  Palm of the hand. If you think you may have hypoglycemia, or if you have a history of not knowing when your blood glucose is getting low (hypoglycemia unawareness), do not use alternative sites. Use your finger instead. Alternative sites may not be as accurate as the fingers, because blood flow is slower in these areas. This means that the result you get may be delayed, and it may be different from the result that you would get from your finger. Follow these instructions at home: Blood glucose log   Every time you check your blood glucose, write down your result. Also write down any notes about things that may be affecting your blood glucose, such as your diet and exercise for the day. This information can help you and your health care provider: ? Look for patterns in your blood glucose over time. ? Adjust your diabetes management plan as needed.  Check if your meter allows you to download your records to a computer. Most glucose meters store a record of glucose readings in the meter. If you have type 1 diabetes:  Check your blood glucose 2 or more times a day.  Also check your blood glucose: ? Before every insulin injection. ? Before and after exercise. ? Before meals. ? 2 hours after a meal. ? Occasionally between 2:00 a.m. and 3:00 a.m., as directed. ? Before potentially dangerous tasks, like driving or using heavy machinery. ? At bedtime.  You may  need to check your blood glucose more often, up to 6-10 times a day, if you: ? Use an insulin pump. ? Need multiple daily injections (MDI). ? Have diabetes that is not well-controlled. ? Are ill. ? Have a history of severe hypoglycemia. ? Have hypoglycemia unawareness. If you have type 2 diabetes:  If you take insulin or other diabetes medicines, check your blood glucose 2 or more times a day.  If you are on intensive insulin therapy, check your blood glucose 4 or more times a day. Occasionally, you may also need to check between 2:00 a.m. and 3:00 a.m., as directed.  Also check your blood glucose: ? Before and after exercise. ? Before potentially dangerous tasks, like driving or using heavy machinery.  You may need to check your blood glucose more often if: ? Your medicine is being adjusted. ? Your diabetes is not well-controlled. ? You are ill. General tips  Always keep your supplies with you.  If you have questions or need help, all blood glucose meters have a 24-hour "hotline" phone number  that you can call. You may also contact your health care provider.  After you use a few boxes of test strips, adjust (calibrate) your blood glucose meter by following instructions that came with your meter. Contact a health care provider if:  Your blood glucose is at or above 240 mg/dL (13.3 mmol/L) for 2 days in a row.  You have been sick or have had a fever for 2 days or longer, and you are not getting better.  You have any of the following problems for more than 6 hours: ? You cannot eat or drink. ? You have nausea or vomiting. ? You have diarrhea. Get help right away if:  Your blood glucose is lower than 54 mg/dL (3 mmol/L).  You become confused or you have trouble thinking clearly.  You have difficulty breathing.  You have moderate or large ketone levels in your urine. Summary  Monitoring your blood sugar (glucose) is an important part of managing your diabetes (diabetes  mellitus).  Blood glucose monitoring involves checking your blood glucose as often as directed and keeping a record (log) of your results over time.  Your health care provider will set individualized treatment goals for you. Your goals will be based on your age, other medical conditions you have, and how you respond to diabetes treatment.  Every time you check your blood glucose, write down your result. Also write down any notes about things that may be affecting your blood glucose, such as your diet and exercise for the day. This information is not intended to replace advice given to you by your health care provider. Make sure you discuss any questions you have with your health care provider. Document Released: 12/04/2003 Document Revised: 09/24/2018 Document Reviewed: 05/12/2016 Elsevier Patient Education  2020 [place].  IMPORTANT INFORMATION: PAY CLOSE ATTENTION   PHYSICIAN DISCHARGE INSTRUCTIONS  Follow with Primary care provider  Sharion Balloon, FNP  and other consultants as instructed by your Hospitalist Physician  [place] IF SYMPTOMS COME BACK, WORSEN OR NEW PROBLEM DEVELOPS   Please note: You were cared for by a hospitalist during your hospital stay. Every effort will be made to forward records to your primary care provider.  You can request that your primary care provider send for your hospital records if they have not received them.  Once you are discharged, your primary care physician will handle any further medical issues. Please note that NO REFILLS for any discharge medications will be authorized once you are discharged, as it is imperative that you return to your primary care physician (or establish a relationship with a primary care physician if you do not have one) for your post hospital discharge needs so that they can reassess your need for medications and monitor your lab values.  Please get a complete blood count and chemistry  panel checked by your Primary MD at your next visit, and again as instructed by your Primary MD.  Get Medicines reviewed and adjusted: Please take all your medications with you for your next visit with your Primary MD  Laboratory/radiological data: Please request your Primary MD to go over all hospital tests and procedure/radiological results at the follow up, please ask your primary care provider to get all Hospital records sent to his/her office.  In some cases, they will be blood work, cultures and biopsy results pending at the time of your discharge. Please request that your primary care provider follow up on these results.  If  you are diabetic, please bring your blood sugar readings with you to your follow up appointment with primary care.    Please call and make your follow up appointments as soon as possible.    Also Note the following: If you experience worsening of your admission symptoms, develop shortness of breath, life threatening emergency, suicidal or homicidal thoughts you must seek medical attention immediately by calling 911 or calling your MD immediately  if symptoms less severe.  You must read complete instructions/literature along with all the possible adverse reactions/side effects for all the Medicines you take and that have been prescribed to you. Take any new Medicines after you have completely understood and accpet all the possible adverse reactions/side effects.   Do not drive when taking Pain medications or sleeping medications (Benzodiazepines)  Do not take more than prescribed Pain, Sleep and Anxiety Medications. It is not advisable to combine anxiety,sleep and pain medications without talking with your primary care practitioner  Special Instructions: If you have smoked or chewed Tobacco  in the last 2 yrs please stop smoking, stop any regular Alcohol  and or any Recreational drug use.  Wear Seat belts while driving.  Do not drive if taking any narcotic, mind  altering or controlled substances or recreational drugs or alcohol.

## 2019-08-19 NOTE — Discharge Summary (Signed)
Physician Discharge Summary  Robert Osborne EVO:[id number] DOB: 1946-02-19 DOA: 08/17/2019  PCP: Sharion Balloon, FNP  Admit date: 08/17/2019 Discharge date: 08/19/2019  Admitted From: Home  Disposition: Home   Recommendations for Outpatient Follow-up:  1. Follow up with PCP in 1 weeks 2. Follow up with cardiology as scheduled for outpatient stress testing.  3. Establish care with endocrinology Dr. Dorris Fetch for poorly controlled diabetes mellitus   Discharge Condition: STABLE   CODE STATUS: FULL    Brief Hospitalization Summary: Please see all hospital notes, images, labs for full details of the hospitalization. Dr. Talmadge Coventry HPI: Robert Osborne is a 73 y.o. male with medical history significant for hypertension, diabetes mellitus, bladder cancer.  Patient presented to the ED with complaints of generalized weakness over the past several weeks, but over the past 2 days weakness has worsened, with difficulty walking.  Per notes patient spouse reported shuffling of his feet.  Patient saw his primary care provider with concern for stroke was sent to the ED. On further questioning patient also reports intermittent chest pain worse with activity-especially at work (he works in [employer] ) and relieved by rest.  His pain is left-sided, nonradiating, and transiently associated with difficulty catching his breath.  He is unable to describe the character of the pain. Chest pain usually lasts 15 to 20 minutes.  He had chest pains earlier today.  Symptoms ongoing over the past several months to a year.  Denies history of heart attacks.  Quit smoking cigarettes about 20 years ago.  ED Course: Noted in the ED, was an obvious left facial droop, which spouse came to recognize in th ED but had not recognized previously.  Blood pressure systolic 299B to 716R.  Unremarkable BMP, CBC,.  Two-view chest x-ray clear.  Head CT negative for acute abnormality. Brief Admission Hx: 73 y.o.malewith medical history  significant forhypertension, diabetes mellitus, bladder cancer. Patient presented to the ED with complaints of generalized weakness over the past several weeks, but over the past 2 days weakness has worsened,with difficulty walking. Per notes patient spouse reported shuffling of his feet. Patient saw his primary care provider with concern for stroke was sent to the ED.    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 His pain is left-sided, nonradiating, andtransiently associated with difficulty catching his breath.  MDM/Assessment & Plan:   1. Left facial droop - Pt had MRI /MRA brain /neck with no acute ischemic findings- continue apirin, follow echocardiogram, continue statin, follow lipids, A1c and inpatient neurology consult recommended continuing aspirin 325 mg daily, MR cervical spine with no findings of myelopathy.  PT evaluation with no PT follow up recommended.   2. Atypical chest pain - he was seen by inpatient cardiology service and no plans for inpatient work up, will follow echo and outpatient myoview study recommended. Follow up with Dr. Percival Spanish.  HS troponins ruled out acute injury.  3. Type 2 DM - poorly controlled as evidenced by A1c of 11%. Advised to start tresiba as ordered by PCP.  Outpatient follow up with Dr. Dorris Fetch with endocrinology to establish care.  Pt advised to test BS at home.  4. Essential hypertension - resumed home meds and follow.  5. Dementia with reported beh disturbance - resumed home olanzapine.  His behavior has been stable in hospital.   DVT prophylaxis: lovenox Code Status: Full  Family Communication: patient updated bedside Disposition Plan: home  Consultants:  Neurology  Cardiology  Procedures:  2D Echocardiogram  IMPRESSIONS 1. The left ventricle has normal systolic function, with an ejection fraction of 55-60%. The  cavity size was normal. There is mildly increased left ventricular wall thickness. Left ventricular diastolic Doppler parameters are consistent with impaired  relaxation. 2. The right ventricle has normal systolic function. The cavity was normal. There is no increase in right ventricular wall thickness. 3. The aortic valve is tricuspid. Mild aortic annular calcification noted. 4. The mitral valve is grossly normal. 5. The tricuspid valve is grossly normal. 6. The aorta is normal unless otherwise noted.  Antimicrobials:  n/a Discharge Diagnoses:  Principal Problem:   Facial droop Active Problems:   Diabetes mellitus without complication (HCC)   Generalized weakness   Neurological deficit present   Discharge Instructions: Discharge Instructions    Call MD for:  difficulty breathing, headache or visual disturbances   Complete by: As directed    Call MD for:  extreme fatigue   Complete by: As directed    Call MD for:  persistant dizziness or light-headedness   Complete by: As directed    Call MD for:  persistant nausea and vomiting   Complete by: As directed    Diet - low sodium heart healthy   Complete by: As directed    Increase activity slowly   Complete by: As directed      Allergies as of 08/19/2019      Reactions   Enalapril Cough      Medication List    TAKE these medications   amLODipine 10 MG tablet Commonly known as: NORVASC Take 1 tablet (10 mg total) by mouth daily.   aspirin 325 MG tablet Take 1 tablet (325 mg total) by mouth daily. Start taking on: August 20, 2019   atorvastatin 40 MG tablet Commonly known as: LIPITOR Take 1 tablet by mouth once daily   blood glucose meter kit and supplies Dispense based on patient and insurance preference. Use up to four times daily as directed. (FOR ICD-10 E10.9, E11.9).   blood glucose meter kit and supplies Kit Dispense based on patient and insurance preference. Use up to twice a daily as directed. (FOR  ICD-10 - type 2 DM uncontrolled E11.9)   losartan 25 MG tablet Commonly known as: COZAAR Take 1 tablet (25 mg total) by mouth daily.   metFORMIN 750 MG 24 hr tablet Commonly known as: Glucophage XR Take 2 tablets (1,500 mg total) by mouth daily with breakfast.   OLANZapine 2.5 MG tablet Commonly known as: ZYPREXA Take 2.5 mg by mouth at bedtime.   potassium chloride SA 20 MEQ tablet Commonly known as: K-DUR Take 1 tablet (20 mEq total) by mouth daily.   Tyler Aas FlexTouch 100 UNIT/ML Sopn FlexTouch Pen Generic drug: insulin degludec Inject 0.05 mLs (5 Units total) into the skin daily.   vitamin B-12 1000 MCG tablet Commonly known as: CYANOCOBALAMIN Take 1,000 mcg by mouth daily.   Vitamin D-3 125 MCG (5000 UT) Tabs Take 1,000 Int'l Units by mouth daily.      Follow-up Information    Sharion Balloon, FNP. Schedule an appointment as soon as possible for a visit in 1 week(s).   Specialty: Family Medicine Contact information: [place] [address] [phone]        Minus Breeding, MD Follow up on 09/07/2019.   Specialty: Cardiology Why: Keep Scheduled Cardiology Follow-up on 09/07/2019 at 3:20PM.  Contact information: [place]  [place] [address] [phone]        Cassandria Anger, MD. Schedule an appointment as soon as possible for a visit in 2 week(s).   Specialty: Endocrinology Why: Establish care for poorly controlled diabetes mellitus Contact information: [place] [address] [phone]          Allergies  Allergen Reactions  . Enalapril Cough   Allergies as of 08/19/2019      Reactions   Enalapril Cough      Medication List    TAKE these medications   amLODipine 10 MG tablet Commonly known as: NORVASC Take 1 tablet (10 mg total) by mouth daily.   aspirin 325 MG tablet Take 1 tablet (325 mg total) by mouth daily. Start taking on: August 20, 2019   atorvastatin 40 MG  tablet Commonly known as: LIPITOR Take 1 tablet by mouth once daily   blood glucose meter kit and supplies Dispense based on patient and insurance preference. Use up to four times daily as directed. (FOR ICD-10 E10.9, E11.9).   blood glucose meter kit and supplies Kit Dispense based on patient and insurance preference. Use up to twice a daily as directed. (FOR ICD-10 - type 2 DM uncontrolled E11.9)   losartan 25 MG tablet Commonly known as: COZAAR Take 1 tablet (25 mg total) by mouth daily.   metFORMIN 750 MG 24 hr tablet Commonly known as: Glucophage XR Take 2 tablets (1,500 mg total) by mouth daily with breakfast.   OLANZapine 2.5 MG tablet Commonly known as: ZYPREXA Take 2.5 mg by mouth at bedtime.   potassium chloride SA 20 MEQ tablet Commonly known as: K-DUR Take 1 tablet (20 mEq total) by mouth daily.   Tyler Aas FlexTouch 100 UNIT/ML Sopn FlexTouch Pen Generic drug: insulin degludec Inject 0.05 mLs (5 Units total) into the skin daily.   vitamin B-12 1000 MCG tablet Commonly known as: CYANOCOBALAMIN Take 1,000 mcg by mouth daily.   Vitamin D-3 125 MCG (5000 UT) Tabs Take 1,000 Int'l Units by mouth daily.       Procedures/Studies: Dg Chest 2 View  Result Date: 08/17/2019 CLINICAL DATA:  Per Triage Note: Patient reports progressive weakness that started several days ago. States he is having difficulty walking. Sent by Josie Saunders for eval for possible stroke.generalized weakness EXAM: CHEST - 2 VIEW COMPARISON:  01/05/2017 FINDINGS: Normal cardiac silhouette with ectatic aorta. Lungs are mildly hyperinflated. No effusion, infiltrate pneumothorax. Remote RIGHT rib fracture. IMPRESSION: No acute cardiopulmonary process. Electronically Signed   By: Suzy Bouchard M.D.   On: 08/17/2019 14:55   Ct Head Wo Contrast  Result Date: 08/17/2019 CLINICAL DATA:  Progressive weakness developing over the past several days. EXAM: CT HEAD WITHOUT CONTRAST TECHNIQUE: Contiguous  axial images were obtained from the base of the skull through the vertex without intravenous contrast. COMPARISON:  Head CT scan 11/20/2017. FINDINGS: Brain: No evidence of acute infarction, hemorrhage, hydrocephalus, extra-axial collection or mass lesion/mass effect. Atrophy and chronic microvascular ischemic change noted. Vascular: No hyperdense vessel or unexpected calcification. Skull: Intact.  No focal lesion. Sinuses/Orbits: Negative. Other: None. IMPRESSION: No acute abnormality. Atrophy and chronic microvascular ischemic change. Electronically Signed   By: Inge Rise M.D.   On: 08/17/2019 14:26   Mr Angio Head Wo Contrast  Result Date: 08/17/2019 CLINICAL DATA:  Initial evaluation for acute left-sided facial droop and left-sided weakness for 2 days. EXAM: MRI HEAD WITHOUT CONTRAST MRA HEAD WITHOUT CONTRAST MRA NECK WITHOUT AND WITH CONTRAST TECHNIQUE: Multiplanar, multiecho  pulse sequences of the brain and surrounding structures were obtained without intravenous contrast. Angiographic images of the Circle of Willis were obtained using MRA technique without intravenous contrast. Angiographic images of the neck were obtained using MRA technique without and with intravenous contrast. Carotid stenosis measurements (when applicable) are obtained utilizing NASCET criteria, using the distal internal carotid diameter as the denominator. CONTRAST:  7.5 cc of Gadavist. COMPARISON:  Prior head CT from earlier the same day. FINDINGS: MRI HEAD FINDINGS Brain: Diffuse prominence of the CSF containing spaces compatible with generalized age-related cerebral atrophy. Patchy and confluent T2/FLAIR hyperintensity within the periventricular deep white matter both cerebral hemispheres most consistent with chronic small vessel ischemic disease, moderate nature. Multiple scattered superimposed remote lacunar infarcts seen involving the hemispheric cerebral white matter bilaterally, with additional remote lacunar infarcts  involving the bilateral basal ganglia and left thalamus. No abnormal foci of restricted diffusion to suggest acute or subacute ischemia. Gray-white matter differentiation maintained. No areas of remote cortical infarction. No acute intracranial hemorrhage. Multiple scattered chronic micro hemorrhages seen predominantly clustered about the deep gray nuclei and cerebellum, most likely related to chronic poorly controlled hypertension. No mass lesion, midline shift or mass effect. Of hydrocephalus. No extra-axial fluid collection. Pituitary gland suprasellar region normal. Midline structures intact. Ventricular prominence related to global parenchymal volume loss Vascular: Major intracranial vascular flow voids grossly maintained at the skull base. Skull and upper cervical spine: Craniocervical junction within normal limits. Visualized upper cervical spine normal. Bone marrow signal intensity within normal limits. No scalp soft tissue abnormality. Sinuses/Orbits: Globes and orbital soft tissues demonstrate no acute finding. Mild scattered mucosal thickening noted throughout the paranasal sinuses. No air-fluid level to suggest acute sinusitis. No mastoid effusion. Inner ear structures grossly normal. Other: None. MRA HEAD FINDINGS ANTERIOR CIRCULATION: Examination technically limited by motion. Distal cervical segments of the internal carotid arteries are widely patent with symmetric antegrade flow. Petrous, cavernous, and supraclinoid segments widely patent without flow-limiting stenosis. A1 segments patent bilaterally. Normal anterior communicating artery complex. Anterior cerebral arteries patent to their distal aspects without flow-limiting stenosis. No M1 stenosis or occlusion. Normal MCA bifurcations. Distal MCA branches well perfused and symmetric. POSTERIOR CIRCULATION: Evaluation of the vertebral arteries mildly limited by artifact on this exam. Vertebral arteries patent as they course into the cranial vault,  and are grossly patent to the vertebrobasilar junction without flow-limiting stenosis. Focal signal loss within the distal left V4 segment, consistent with artifact as this appears patent on corresponding MRA of the neck. Normal flow void seen within this region on corresponding MRI. Left PICA patent. Right PICA not visualized. Basilar tortuous with short-segment fenestration at its proximal aspect, best seen on corresponding MRA of the neck. Short-segment mild stenosis at the distal basilar (series 1, image 75). Basilar otherwise widely patent. Superior cerebral arteries patent proximally. Left PCA primarily supplied via the basilar. Hypoplastic right P1 segment with robust right posterior communicating artery supplies the right PCA. Right PCA perfused to its distal aspect without stenosis. Left PCA patent proximally, not well seen distally, most consistent with artifact as this appears patent on corresponding MRA of the neck. No intracranial aneurysm. MRA NECK FINDINGS Source images reviewed. Visualized aortic arch of normal caliber with normal 3 vessel morphology. No hemodynamically significant stenosis seen about the origin of the great vessels. Visualized subclavian arteries widely patent. Right CCA tortuous proximally but widely patent to the bifurcation without stenosis. No significant atheromatous narrowing seen about the right bifurcation. Right ICA tortuous but widely patent  to the skull base without stenosis, dissection or occlusion. Left CCA tortuous proximally but widely patent to the bifurcation without stenosis. Minor atheromatous change about the proximal left ICA without hemodynamically significant stenosis. Left ICA tortuous distally but widely patent to the skull base without stenosis, dissection or occlusion. Both vertebral arteries arise from the subclavian arteries. Origin of the vertebral arteries not well assessed due to motion. Visualized vertebral arteries widely patent within the neck  without stenosis, dissection or occlusion. Right vertebral artery slightly dominant. IMPRESSION: MRI HEAD IMPRESSION: 1. No acute intracranial infarct or other abnormality identified. 2. Moderately advanced cerebral atrophy with chronic microvascular ischemic disease, with multiple remote lacunar infarcts involving the hemispheric cerebral white matter and deep gray nuclei. 3. Multiple scattered chronic micro hemorrhages clustered about the deep gray nuclei and cerebellum, most consistent with underlying chronic hypertensive encephalopathy. MRA HEAD IMPRESSION: 1. Negative intracranial MRA for large vessel occlusion. 2. Short-segment mild stenosis involving the distal basilar artery. No other hemodynamically significant or proximal correctable stenosis identified. 3. No intracranial aneurysm. MRA NECK IMPRESSION: 1. Wide patency of both carotid artery systems within the neck. No hemodynamically significant stenosis or other acute vascular abnormality. 2. Wide patency of both vertebral arteries within the neck. 3. Diffuse tortuosity of the major arterial vasculature of the neck, suggesting chronic underlying hypertension. Electronically Signed   By: Jeannine Boga M.D.   On: 08/17/2019 20:33   Mr Angio Neck W Wo Contrast  Result Date: 08/17/2019 CLINICAL DATA:  Initial evaluation for acute left-sided facial droop and left-sided weakness for 2 days. EXAM: MRI HEAD WITHOUT CONTRAST MRA HEAD WITHOUT CONTRAST MRA NECK WITHOUT AND WITH CONTRAST TECHNIQUE: Multiplanar, multiecho pulse sequences of the brain and surrounding structures were obtained without intravenous contrast. Angiographic images of the Circle of Willis were obtained using MRA technique without intravenous contrast. Angiographic images of the neck were obtained using MRA technique without and with intravenous contrast. Carotid stenosis measurements (when applicable) are obtained utilizing NASCET criteria, using the distal internal carotid diameter  as the denominator. CONTRAST:  7.5 cc of Gadavist. COMPARISON:  Prior head CT from earlier the same day. FINDINGS: MRI HEAD FINDINGS Brain: Diffuse prominence of the CSF containing spaces compatible with generalized age-related cerebral atrophy. Patchy and confluent T2/FLAIR hyperintensity within the periventricular deep white matter both cerebral hemispheres most consistent with chronic small vessel ischemic disease, moderate nature. Multiple scattered superimposed remote lacunar infarcts seen involving the hemispheric cerebral white matter bilaterally, with additional remote lacunar infarcts involving the bilateral basal ganglia and left thalamus. No abnormal foci of restricted diffusion to suggest acute or subacute ischemia. Gray-white matter differentiation maintained. No areas of remote cortical infarction. No acute intracranial hemorrhage. Multiple scattered chronic micro hemorrhages seen predominantly clustered about the deep gray nuclei and cerebellum, most likely related to chronic poorly controlled hypertension. No mass lesion, midline shift or mass effect. Of hydrocephalus. No extra-axial fluid collection. Pituitary gland suprasellar region normal. Midline structures intact. Ventricular prominence related to global parenchymal volume loss Vascular: Major intracranial vascular flow voids grossly maintained at the skull base. Skull and upper cervical spine: Craniocervical junction within normal limits. Visualized upper cervical spine normal. Bone marrow signal intensity within normal limits. No scalp soft tissue abnormality. Sinuses/Orbits: Globes and orbital soft tissues demonstrate no acute finding. Mild scattered mucosal thickening noted throughout the paranasal sinuses. No air-fluid level to suggest acute sinusitis. No mastoid effusion. Inner ear structures grossly normal. Other: None. MRA HEAD FINDINGS ANTERIOR CIRCULATION: Examination technically limited by motion. Distal  cervical segments of the  internal carotid arteries are widely patent with symmetric antegrade flow. Petrous, cavernous, and supraclinoid segments widely patent without flow-limiting stenosis. A1 segments patent bilaterally. Normal anterior communicating artery complex. Anterior cerebral arteries patent to their distal aspects without flow-limiting stenosis. No M1 stenosis or occlusion. Normal MCA bifurcations. Distal MCA branches well perfused and symmetric. POSTERIOR CIRCULATION: Evaluation of the vertebral arteries mildly limited by artifact on this exam. Vertebral arteries patent as they course into the cranial vault, and are grossly patent to the vertebrobasilar junction without flow-limiting stenosis. Focal signal loss within the distal left V4 segment, consistent with artifact as this appears patent on corresponding MRA of the neck. Normal flow void seen within this region on corresponding MRI. Left PICA patent. Right PICA not visualized. Basilar tortuous with short-segment fenestration at its proximal aspect, best seen on corresponding MRA of the neck. Short-segment mild stenosis at the distal basilar (series 1, image 75). Basilar otherwise widely patent. Superior cerebral arteries patent proximally. Left PCA primarily supplied via the basilar. Hypoplastic right P1 segment with robust right posterior communicating artery supplies the right PCA. Right PCA perfused to its distal aspect without stenosis. Left PCA patent proximally, not well seen distally, most consistent with artifact as this appears patent on corresponding MRA of the neck. No intracranial aneurysm. MRA NECK FINDINGS Source images reviewed. Visualized aortic arch of normal caliber with normal 3 vessel morphology. No hemodynamically significant stenosis seen about the origin of the great vessels. Visualized subclavian arteries widely patent. Right CCA tortuous proximally but widely patent to the bifurcation without stenosis. No significant atheromatous narrowing seen  about the right bifurcation. Right ICA tortuous but widely patent to the skull base without stenosis, dissection or occlusion. Left CCA tortuous proximally but widely patent to the bifurcation without stenosis. Minor atheromatous change about the proximal left ICA without hemodynamically significant stenosis. Left ICA tortuous distally but widely patent to the skull base without stenosis, dissection or occlusion. Both vertebral arteries arise from the subclavian arteries. Origin of the vertebral arteries not well assessed due to motion. Visualized vertebral arteries widely patent within the neck without stenosis, dissection or occlusion. Right vertebral artery slightly dominant. IMPRESSION: MRI HEAD IMPRESSION: 1. No acute intracranial infarct or other abnormality identified. 2. Moderately advanced cerebral atrophy with chronic microvascular ischemic disease, with multiple remote lacunar infarcts involving the hemispheric cerebral white matter and deep gray nuclei. 3. Multiple scattered chronic micro hemorrhages clustered about the deep gray nuclei and cerebellum, most consistent with underlying chronic hypertensive encephalopathy. MRA HEAD IMPRESSION: 1. Negative intracranial MRA for large vessel occlusion. 2. Short-segment mild stenosis involving the distal basilar artery. No other hemodynamically significant or proximal correctable stenosis identified. 3. No intracranial aneurysm. MRA NECK IMPRESSION: 1. Wide patency of both carotid artery systems within the neck. No hemodynamically significant stenosis or other acute vascular abnormality. 2. Wide patency of both vertebral arteries within the neck. 3. Diffuse tortuosity of the major arterial vasculature of the neck, suggesting chronic underlying hypertension. Electronically Signed   By: Jeannine Boga M.D.   On: 08/17/2019 20:33   Mr Brain Wo Contrast  Result Date: 08/17/2019 CLINICAL DATA:  Initial evaluation for acute left-sided facial droop and  left-sided weakness for 2 days. EXAM: MRI HEAD WITHOUT CONTRAST MRA HEAD WITHOUT CONTRAST MRA NECK WITHOUT AND WITH CONTRAST TECHNIQUE: Multiplanar, multiecho pulse sequences of the brain and surrounding structures were obtained without intravenous contrast. Angiographic images of the Circle of Willis were obtained using MRA technique without intravenous  contrast. Angiographic images of the neck were obtained using MRA technique without and with intravenous contrast. Carotid stenosis measurements (when applicable) are obtained utilizing NASCET criteria, using the distal internal carotid diameter as the denominator. CONTRAST:  7.5 cc of Gadavist. COMPARISON:  Prior head CT from earlier the same day. FINDINGS: MRI HEAD FINDINGS Brain: Diffuse prominence of the CSF containing spaces compatible with generalized age-related cerebral atrophy. Patchy and confluent T2/FLAIR hyperintensity within the periventricular deep white matter both cerebral hemispheres most consistent with chronic small vessel ischemic disease, moderate nature. Multiple scattered superimposed remote lacunar infarcts seen involving the hemispheric cerebral white matter bilaterally, with additional remote lacunar infarcts involving the bilateral basal ganglia and left thalamus. No abnormal foci of restricted diffusion to suggest acute or subacute ischemia. Gray-white matter differentiation maintained. No areas of remote cortical infarction. No acute intracranial hemorrhage. Multiple scattered chronic micro hemorrhages seen predominantly clustered about the deep gray nuclei and cerebellum, most likely related to chronic poorly controlled hypertension. No mass lesion, midline shift or mass effect. Of hydrocephalus. No extra-axial fluid collection. Pituitary gland suprasellar region normal. Midline structures intact. Ventricular prominence related to global parenchymal volume loss Vascular: Major intracranial vascular flow voids grossly maintained at the  skull base. Skull and upper cervical spine: Craniocervical junction within normal limits. Visualized upper cervical spine normal. Bone marrow signal intensity within normal limits. No scalp soft tissue abnormality. Sinuses/Orbits: Globes and orbital soft tissues demonstrate no acute finding. Mild scattered mucosal thickening noted throughout the paranasal sinuses. No air-fluid level to suggest acute sinusitis. No mastoid effusion. Inner ear structures grossly normal. Other: None. MRA HEAD FINDINGS ANTERIOR CIRCULATION: Examination technically limited by motion. Distal cervical segments of the internal carotid arteries are widely patent with symmetric antegrade flow. Petrous, cavernous, and supraclinoid segments widely patent without flow-limiting stenosis. A1 segments patent bilaterally. Normal anterior communicating artery complex. Anterior cerebral arteries patent to their distal aspects without flow-limiting stenosis. No M1 stenosis or occlusion. Normal MCA bifurcations. Distal MCA branches well perfused and symmetric. POSTERIOR CIRCULATION: Evaluation of the vertebral arteries mildly limited by artifact on this exam. Vertebral arteries patent as they course into the cranial vault, and are grossly patent to the vertebrobasilar junction without flow-limiting stenosis. Focal signal loss within the distal left V4 segment, consistent with artifact as this appears patent on corresponding MRA of the neck. Normal flow void seen within this region on corresponding MRI. Left PICA patent. Right PICA not visualized. Basilar tortuous with short-segment fenestration at its proximal aspect, best seen on corresponding MRA of the neck. Short-segment mild stenosis at the distal basilar (series 1, image 75). Basilar otherwise widely patent. Superior cerebral arteries patent proximally. Left PCA primarily supplied via the basilar. Hypoplastic right P1 segment with robust right posterior communicating artery supplies the right PCA.  Right PCA perfused to its distal aspect without stenosis. Left PCA patent proximally, not well seen distally, most consistent with artifact as this appears patent on corresponding MRA of the neck. No intracranial aneurysm. MRA NECK FINDINGS Source images reviewed. Visualized aortic arch of normal caliber with normal 3 vessel morphology. No hemodynamically significant stenosis seen about the origin of the great vessels. Visualized subclavian arteries widely patent. Right CCA tortuous proximally but widely patent to the bifurcation without stenosis. No significant atheromatous narrowing seen about the right bifurcation. Right ICA tortuous but widely patent to the skull base without stenosis, dissection or occlusion. Left CCA tortuous proximally but widely patent to the bifurcation without stenosis. Minor atheromatous change about the proximal  left ICA without hemodynamically significant stenosis. Left ICA tortuous distally but widely patent to the skull base without stenosis, dissection or occlusion. Both vertebral arteries arise from the subclavian arteries. Origin of the vertebral arteries not well assessed due to motion. Visualized vertebral arteries widely patent within the neck without stenosis, dissection or occlusion. Right vertebral artery slightly dominant. IMPRESSION: MRI HEAD IMPRESSION: 1. No acute intracranial infarct or other abnormality identified. 2. Moderately advanced cerebral atrophy with chronic microvascular ischemic disease, with multiple remote lacunar infarcts involving the hemispheric cerebral white matter and deep gray nuclei. 3. Multiple scattered chronic micro hemorrhages clustered about the deep gray nuclei and cerebellum, most consistent with underlying chronic hypertensive encephalopathy. MRA HEAD IMPRESSION: 1. Negative intracranial MRA for large vessel occlusion. 2. Short-segment mild stenosis involving the distal basilar artery. No other hemodynamically significant or proximal  correctable stenosis identified. 3. No intracranial aneurysm. MRA NECK IMPRESSION: 1. Wide patency of both carotid artery systems within the neck. No hemodynamically significant stenosis or other acute vascular abnormality. 2. Wide patency of both vertebral arteries within the neck. 3. Diffuse tortuosity of the major arterial vasculature of the neck, suggesting chronic underlying hypertension. Electronically Signed   By: Jeannine Boga M.D.   On: 08/17/2019 20:33   Mr Cervical Spine Wo Contrast  Result Date: 08/19/2019 CLINICAL DATA:  Ataxia. EXAM: MRI CERVICAL SPINE WITHOUT CONTRAST TECHNIQUE: Multiplanar, multisequence MR imaging of the cervical spine was performed. No intravenous contrast was administered. COMPARISON:  None. FINDINGS: Alignment: Minimal retrolisthesis of C3 on C4 and of C4 on C5. Vertebrae: There is an old nonunion fracture of the base of the odontoid process. No acute abnormalities. Cord: Normal signal and morphology. Posterior Fossa, vertebral arteries, paraspinal tissues: Paraspinal soft tissues are normal. Prominent cerebral atrophy. Disc levels: C2-3: Negative. C3-4: Marked disc space narrowing. Small broad-based disc osteophyte complex with slightly indents the ventral aspect of the spinal cord without myelopathy. Slight encroachment upon the lateral recesses. No significant foraminal stenosis. C4-5: Disc space narrowing. Broad-based disc osteophyte complex extending into the right neural foramen. No spinal cord compression. Bilateral foraminal stenosis. Moderate left facet arthritis. C5-6: Small central disc bulge without neural impingement. Moderately severe left facet arthritis. No foraminal stenosis. C6-7: Negative. C7-T1: Negative. IMPRESSION: 1. 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 No abnormality of alignment. 2. Degenerative disc disease at C3-4 and C4-5 without focal neural impingement. 3. No evidence myelopathy or significant compression of the cervical spinal.  Electronically Signed   By: Lorriane Shire M.D.   On: 08/19/2019 08:37      Subjective: Pt without complaints,  He is eating, drinking and ambulating well.  He wants to go home.   Discharge Exam: Vitals:   08/19/19 0533 08/19/19 0915  BP: 140/84 127/73  Pulse: 62   Resp: 16   Temp: 98.8 F (37.1 C)   SpO2: 99%    Vitals:   08/18/19 1941 08/18/19 2102 08/19/19 0533 08/19/19 0915  BP:  131/84 140/84 127/73  Pulse:  69 62   Resp:  17 16   Temp:  98.4 F (36.9 C) 98.8 F (37.1 C)   TempSrc:  Oral Oral   SpO2: 96% 98% 99%   Weight:      Height:       General: Pt is alert, awake, not in acute distress Cardiovascular: RRR, S1/S2 +, no rubs, no gallops Respiratory: CTA bilaterally, no wheezing, no rhonchi Abdominal: Soft, NT, ND, bowel sounds + Extremities: no edema, no cyanosis Neurological: nonfocal exam.  The results of significant diagnostics from this hospitalization (including imaging, microbiology, ancillary and laboratory) are listed below for reference.     Microbiology: Recent Results (from the past 240 hour(s))  SARS Coronavirus 2 [facility] order, Performed in [facility] hospital lab) Nasopharyngeal Nasopharyngeal Swab     Status: None   Collection Time: 08/17/19  3:13 PM   Specimen: Nasopharyngeal Swab  Result Value Ref Range Status   SARS Coronavirus 2 NEGATIVE NEGATIVE Final    Comment: (NOTE) If result is NEGATIVE SARS-CoV-2 target nucleic acids are NOT DETECTED. The SARS-CoV-2 RNA is generally detectable in upper and lower  respiratory specimens during the acute phase of infection. The lowest  concentration of SARS-CoV-2 viral copies this assay can detect is 250  copies / mL. A negative result does not preclude SARS-CoV-2 infection  and should not be used as the sole basis for treatment or other  patient management decisions.  A negative result may occur with  improper specimen collection / handling, submission of specimen other  than nasopharyngeal  swab, presence of viral mutation(s) within the  areas targeted by this assay, and inadequate number of viral copies  (<250 copies / mL). A negative result must be combined with clinical  observations, patient history, and epidemiological information. If result is POSITIVE SARS-CoV-2 target nucleic acids are DETECTED. The SARS-CoV-2 RNA is generally detectable in upper and lower  respiratory specimens dur ing the acute phase of infection.  Positive  results are indicative of active infection with SARS-CoV-2.  Clinical  correlation with patient history and other diagnostic information is  necessary to determine patient infection status.  Positive results do  not rule out bacterial infection or co-infection with other viruses. If result is PRESUMPTIVE POSTIVE SARS-CoV-2 nucleic acids MAY BE PRESENT.   A presumptive positive result was obtained on the submitted specimen  and confirmed on repeat testing.  While 2019 novel coronavirus  (SARS-CoV-2) nucleic acids may be present in the submitted sample  additional confirmatory testing may be necessary for epidemiological  and / or clinical management purposes  to differentiate between  SARS-CoV-2 and other Sarbecovirus currently known to infect humans.  If clinically indicated additional testing with an alternate test  methodology [phone]) is advised. The SARS-CoV-2 RNA is generally  detectable in upper and lower respiratory sp ecimens during the acute  phase of infection. The expected result is Negative. Fact Sheet for Patients:  StrictlyIdeas.no Fact Sheet for Healthcare Providers: BankingDealers.co.za This test is not yet approved or cleared by the [place] FDA and has been authorized for detection and/or diagnosis of SARS-CoV-2 by FDA under an Emergency Use Authorization (EUA).  This EUA will remain in effect (meaning this test can be used) for the duration of the COVID-19 declaration  under Section 564(b)(1) of the Act, 21 U.S.C. section 360bbb-3(b)(1), unless the authorization is terminated or revoked sooner. Performed at [facility], [address], [place], [place] [address]      Labs: BNP (last 3 results) No results for input(s): BNP in the last 8760 hours. Basic Metabolic Panel: Recent Labs  Lab 08/17/19 1317  NA 141  K 4.0  CL 104  CO2 27  GLUCOSE 276*  BUN 17  CREATININE 1.03  CALCIUM 9.1  MG 1.9   Liver Function Tests: No results for input(s): AST, ALT, ALKPHOS, BILITOT, PROT, ALBUMIN in the last 168 hours. No results for input(s): LIPASE, AMYLASE in the last 168 hours. No results for input(s): AMMONIA in the last 168 hours. CBC:  Recent Labs  Lab 08/17/19 1317  WBC 5.7  HGB 15.0  HCT 44.4  MCV 87.9  PLT 220   Cardiac Enzymes: No results for input(s): CKTOTAL, CKMB, CKMBINDEX, TROPONINI in the last 168 hours. BNP: Invalid input(s): POCBNP CBG: Recent Labs  Lab 08/18/19 1100 08/18/19 1612 08/18/19 2104 08/19/19 0738 08/19/19 1110  GLUCAP 148* 159* 178* 151* 226*   D-Dimer No results for input(s): DDIMER in the last 72 hours. Hgb A1c Recent Labs    08/17/19 1317  HGBA1C 11.4*   Lipid Profile Recent Labs    08/18/19 0503  CHOL 130  HDL 39*  LDLCALC 76  TRIG 76  CHOLHDL 3.3   Thyroid function studies No results for input(s): TSH, T4TOTAL, T3FREE, THYROIDAB in the last 72 hours.  Invalid input(s): FREET3 Anemia work up Recent Labs    08/17/19 2026  VITAMINB12 897   Urinalysis    Component Value Date/Time   APPEARANCEUR Clear 11/20/2017 1036   GLUCOSEU 2+ (A) 11/20/2017 1036   BILIRUBINUR Negative 11/20/2017 1036   PROTEINUR Trace (A) 11/20/2017 1036   UROBILINOGEN negative 05/17/2013 1536   NITRITE Negative 11/20/2017 1036   LEUKOCYTESUR Negative 11/20/2017 1036   Sepsis Labs Invalid input(s): PROCALCITONIN,  WBC,  LACTICIDVEN Microbiology Recent Results (from the past 240 hour(s))  SARS Coronavirus 2  [facility] order, Performed in [facility] hospital lab) Nasopharyngeal Nasopharyngeal Swab     Status: None   Collection Time: 08/17/19  3:13 PM   Specimen: Nasopharyngeal Swab  Result Value Ref Range Status   SARS Coronavirus 2 NEGATIVE NEGATIVE Final    Comment: (NOTE) If result is NEGATIVE SARS-CoV-2 target nucleic acids are NOT DETECTED. The SARS-CoV-2 RNA is generally detectable in upper and lower  respiratory specimens during the acute phase of infection. The lowest  concentration of SARS-CoV-2 viral copies this assay can detect is 250  copies / mL. A negative result does not preclude SARS-CoV-2 infection  and should not be used as the sole basis for treatment or other  patient management decisions.  A negative result may occur with  improper specimen collection / handling, submission of specimen other  than nasopharyngeal swab, presence of viral mutation(s) within the  areas targeted by this assay, and inadequate number of viral copies  (<250 copies / mL). A negative result must be combined with clinical  observations, patient history, and epidemiological information. If result is POSITIVE SARS-CoV-2 target nucleic acids are DETECTED. The SARS-CoV-2 RNA is generally detectable in upper and lower  respiratory specimens dur ing the acute phase of infection.  Positive  results are indicative of active infection with SARS-CoV-2.  Clinical  correlation with patient history and other diagnostic information is  necessary to determine patient infection status.  Positive results do  not rule out bacterial infection or co-infection with other viruses. If result is PRESUMPTIVE POSTIVE SARS-CoV-2 nucleic acids MAY BE PRESENT.   A presumptive positive result was obtained on the submitted specimen  and confirmed on repeat testing.  While 2019 novel coronavirus  (SARS-CoV-2) nucleic acids may be present in the submitted sample  additional confirmatory testing may be necessary for  epidemiological  and / or clinical management purposes  to differentiate between  SARS-CoV-2 and other Sarbecovirus currently known to infect humans.  If clinically indicated additional testing with an alternate test  methodology [phone]) is advised. The SARS-CoV-2 RNA is generally  detectable in upper and lower respiratory sp ecimens during the acute  phase of infection. The expected result is  Negative. Fact Sheet for Patients:  StrictlyIdeas.no Fact Sheet for Healthcare Providers: BankingDealers.co.za This test is not yet approved or cleared by the [place] FDA and has been authorized for detection and/or diagnosis of SARS-CoV-2 by FDA under an Emergency Use Authorization (EUA).  This EUA will remain in effect (meaning this test can be used) for the duration of the COVID-19 declaration under Section 564(b)(1) of the Act, 21 U.S.C. section 360bbb-3(b)(1), unless the authorization is terminated or revoked sooner. Performed at [facility], [address] [place], [place] [address]    Time coordinating discharge:   SIGNED:  Irwin Brakeman, MD  [facility] 08/19/2019, 11:38 AM How to contact the [facility] Attending or Consulting provider [place] or covering provider during after hours [place], for this patient?  1. Check the care team in [facility] and look for a) attending/consulting [facility] provider listed and b) the [facility] team listed 2. Log into [email or web address] and use 's universal password to access. If you do not have the password, please contact the hospital operator. 3. Locate the [facility] provider you are looking for under [facility] and page to a number that you can be directly reached. 4. If you still have difficulty reaching the provider, please page the [facility] (Director on Call) for the Hospitalists listed on amion for assistance.

## 2019-08-20 LAB — HOMOCYSTEINE: Homocysteine: 10.8 umol/L (ref 0.0–19.2)

## 2019-08-20 LAB — RPR: RPR Ser Ql: NONREACTIVE

## 2019-08-22 DIAGNOSIS — R69 Illness, unspecified: Secondary | ICD-10-CM | POA: Diagnosis not present

## 2019-08-23 ENCOUNTER — Telehealth: Payer: Self-pay | Admitting: Family

## 2019-08-23 ENCOUNTER — Other Ambulatory Visit: Payer: Self-pay | Admitting: *Deleted

## 2019-08-23 DIAGNOSIS — E1165 Type 2 diabetes mellitus with hyperglycemia: Secondary | ICD-10-CM

## 2019-08-23 DIAGNOSIS — R69 Illness, unspecified: Secondary | ICD-10-CM | POA: Diagnosis not present

## 2019-08-23 MED ORDER — BLOOD GLUCOSE METER KIT: PACK | 1 refills | Status: DC

## 2019-08-23 MED ORDER — BLOOD GLUCOSE METER KIT: PACK | 0 refills | Status: DC

## 2019-08-23 NOTE — Telephone Encounter (Signed)
Printed and faxed

## 2019-08-23 NOTE — Addendum Note (Signed)
Addended by: Zannie Cove on: 08/23/2019 01:43 PM   Modules accepted: Orders

## 2019-08-23 NOTE — Telephone Encounter (Signed)
Error last note = = can we send in another meter - and write "give device covered by insurance"

## 2019-08-23 NOTE — Telephone Encounter (Signed)
Can we call this in to the pharmacy as the rx prints and I will not be there to sign.

## 2019-08-23 NOTE — Progress Notes (Signed)
RX for glucometer printed and faxed to [employer]

## 2019-08-23 NOTE — Telephone Encounter (Signed)
Pt aware.

## 2019-08-26 ENCOUNTER — Telehealth: Payer: Self-pay | Admitting: Family

## 2019-08-26 ENCOUNTER — Other Ambulatory Visit: Payer: Self-pay

## 2019-08-26 MED ORDER — SITAGLIPTIN PHOSPHATE 100 MG PO TABS: 100.0000 mg | ORAL_TABLET | Freq: Every day | ORAL | 1 refills | Status: DC

## 2019-08-26 NOTE — Telephone Encounter (Signed)
Wanda aware

## 2019-08-26 NOTE — Telephone Encounter (Signed)
RX for Januvia Prescription sent to pharmacy. Strict low carb diet.

## 2019-08-29 ENCOUNTER — Ambulatory Visit (INDEPENDENT_AMBULATORY_CARE_PROVIDER_SITE_OTHER): Payer: Medicare HMO | Admitting: Family

## 2019-08-29 ENCOUNTER — Encounter: Payer: Self-pay | Admitting: Family

## 2019-08-29 ENCOUNTER — Other Ambulatory Visit: Payer: Self-pay

## 2019-08-29 VITALS — BP 133/85 | HR 72 | Temp 97.8°F | Resp 16 | Ht 67.0 in | Wt 149.8 lb

## 2019-08-29 DIAGNOSIS — R531 Weakness: Secondary | ICD-10-CM | POA: Diagnosis not present

## 2019-08-29 DIAGNOSIS — Z09 Encounter for follow-up examination after completed treatment for conditions other than malignant neoplasm: Secondary | ICD-10-CM | POA: Diagnosis not present

## 2019-08-29 DIAGNOSIS — R6889 Other general symptoms and signs: Secondary | ICD-10-CM | POA: Diagnosis not present

## 2019-08-29 DIAGNOSIS — E1165 Type 2 diabetes mellitus with hyperglycemia: Secondary | ICD-10-CM | POA: Diagnosis not present

## 2019-08-29 LAB — BAYER DCA HB A1C WAIVED: HB A1C (BAYER DCA - WAIVED): 12 % — ABNORMAL HIGH (ref <7.0)

## 2019-08-29 NOTE — Patient Instructions (Signed)
Weakness °Weakness is a lack of strength. You may feel weak all over your body (generalized), or you may feel weak in one specific part of your body (focal). Common causes of weakness include: °· Infection and immune system disorders. °· Physical exhaustion. °· Internal bleeding or other blood loss that results in a lack of red blood cells (anemia). °· Dehydration. °· An imbalance in mineral (electrolyte) levels, such as potassium. °· Heart disease, circulation problems, or stroke. °Other causes include: °· Some medicines or cancer treatment. °· Stress, anxiety, or depression. °· Nervous system disorders. °· Thyroid disorders. °· Loss of muscle strength because of age or inactivity. °· Poor sleep quality or sleep disorders. °The cause of your weakness may not be known. Some causes of weakness can be serious, so it is important to see your health care provider. °Follow these instructions at home: °Activity °· Rest as needed. °· Try to get enough sleep. Most adults need 7-8 hours of quality sleep each night. Talk to your health care provider about how much sleep you need each night. °· Do exercises, such as arm curls and leg raises, for 30 minutes at least 2 days a week or as told by your health care provider. This helps build muscle strength. °· Consider working with a physical therapist or trainer who can develop an exercise plan to help you gain muscle strength. °General instructions ° °· Take over-the-counter and prescription medicines only as told by your health care provider. °· Eat a healthy, well-balanced diet. This includes: °? Proteins to build muscles, such as lean meats and fish. °? Fresh fruits and vegetables. °? Carbohydrates to boost energy, such as whole grains. °· Drink enough fluid to keep your urine pale yellow. °· Keep all follow-up visits as told by your health care provider. This is important. °Contact a health care provider if your weakness: °· Does not improve or gets worse. °· Affects your  ability to think clearly. °· Affects your ability to do your normal daily activities. °Get help right away if you: °· Develop sudden weakness, especially on one side of your face or body. °· Have chest pain. °· Have trouble breathing or shortness of breath. °· Have problems with your vision. °· Have trouble talking or swallowing. °· Have trouble standing or walking. °· Are light-headed or lose consciousness. °Summary °· Weakness is a lack of strength. You may feel weak all over your body or just in one specific part of your body. °· Weakness can be caused by a variety of things. In some cases, the cause may be unknown. °· Rest as needed, and try to get enough sleep. Most adults need 7-8 hours of quality sleep each night. °· Eat a healthy, well-balanced diet. °This information is not intended to replace advice given to you by your health care provider. Make sure you discuss any questions you have with your health care provider. °Document Released: 12/01/2005 Document Revised: 07/07/2018 Document Reviewed: 07/07/2018 °Elsevier Patient Education © 2020 Elsevier Inc. ° °

## 2019-08-29 NOTE — Progress Notes (Signed)
Subjective:    Patient ID: Robert Osborne, male    DOB: October 10, 1946, 73 y.o.   MRN: [id number]  Chief Complaint  Patient presents with  . Hospitalization Follow-up   Pt presents to the office today for hospital follow up. He went to the ED on 08/17/19 for weakness and difficulty walking. He had a negative MRI/MRA brain with no acute ischemic finding.   He states he was taking metformin daily, but started having diarrhea. He states he did not take it yesterday. He has a follow up with Endo.   Diabetes He presents for his follow-up diabetic visit. He has type 2 diabetes mellitus. His disease course has been worsening. There are no hypoglycemic associated symptoms. Associated symptoms include weakness. Pertinent negatives for diabetes include no blurred vision, no foot paresthesias and no foot ulcerations. Symptoms are stable. Pertinent negatives for diabetic complications include no CVA, heart disease or nephropathy. Risk factors for coronary artery disease include dyslipidemia, diabetes mellitus, male sex, hypertension and sedentary lifestyle. He is following a generally healthy diet. Eye exam is current.      Review of Systems  Eyes: Negative for blurred vision.  Neurological: Positive for weakness.  All other systems reviewed and are negative.      Objective:   Physical Exam Vitals signs reviewed.  Constitutional:      General: He is not in acute distress.    Appearance: He is well-developed.  HENT:     Head: Normocephalic.     Right Ear: Tympanic membrane normal.     Left Ear: Tympanic membrane normal.  Eyes:     General:        Right eye: No discharge.        Left eye: No discharge.     Pupils: Pupils are equal, round, and reactive to light.  Neck:     Musculoskeletal: Normal range of motion and neck supple.     Thyroid: No thyromegaly.  Cardiovascular:     Rate and Rhythm: Normal rate and regular rhythm.     Heart sounds: Murmur present.  Pulmonary:     Effort:  Pulmonary effort is normal. No respiratory distress.     Breath sounds: Normal breath sounds. No wheezing.  Abdominal:     General: Bowel sounds are normal. There is no distension.     Palpations: Abdomen is soft.     Tenderness: There is no abdominal tenderness.  Musculoskeletal: Normal range of motion.        General: No tenderness.  Skin:    General: Skin is warm and dry.     Findings: No erythema or rash.  Neurological:     Mental Status: He is alert and oriented to person, place, and time.     Cranial Nerves: No cranial nerve deficit.     Deep Tendon Reflexes: Reflexes are normal and symmetric.  Psychiatric:        Behavior: Behavior normal.        Thought Content: Thought content normal.        Judgment: Judgment normal.       BP 133/85   Pulse 72   Temp 97.8 F (36.6 C) (Temporal)   Resp 16   Ht '5\' 7"'$  (1.702 m)   Wt 149 lb 12.8 oz (67.9 kg)   SpO2 98%   BMI 23.46 kg/m      Assessment & Plan:  Robert Osborne comes in today with chief complaint of Hospitalization Follow-up   Diagnosis and  orders addressed:  1. Generalized weakness Encouraged healthy diet and exercise  - CMP14+EGFR - Anemia Profile B  2. Type 2 diabetes mellitus with hyperglycemia, without long-term current use of insulin (HCC) Strict low carb  Continue Januvia and start Tresbia  - Bayer DCA Hb A1c Waived - CMP14+EGFR - Anemia Profile B - Microalbumin / creatinine urine ratio  3. Hospital discharge follow-up - CMP14+EGFR - Anemia Profile B   Labs pending Health Maintenance reviewed Diet and exercise encouraged  Follow up plan: 1 months   Evelina Dun, FNP

## 2019-08-30 LAB — ANEMIA PROFILE B
Basophils Absolute: 0 10*3/uL (ref 0.0–0.2)
Basos: 0 %
EOS (ABSOLUTE): 0 10*3/uL (ref 0.0–0.4)
Eos: 0 %
Ferritin: 80 ng/mL (ref 30–400)
Folate: 12.5 ng/mL (ref >3.0)
Hematocrit: 44.5 % (ref 37.5–51.0)
Hemoglobin: 15.1 g/dL (ref 13.0–17.7)
Immature Grans (Abs): 0 10*3/uL (ref 0.0–0.1)
Immature Granulocytes: 0 %
Iron Saturation: 27 % (ref 15–55)
Iron: 82 ug/dL (ref 38–169)
Lymphocytes Absolute: 0.9 10*3/uL (ref 0.7–3.1)
Lymphs: 20 %
MCH: 29.4 pg (ref 26.6–33.0)
MCHC: 33.9 g/dL (ref 31.5–35.7)
MCV: 87 fL (ref 79–97)
Monocytes Absolute: 0.5 10*3/uL (ref 0.1–0.9)
Monocytes: 11 %
Neutrophils Absolute: 3.1 10*3/uL (ref 1.4–7.0)
Neutrophils: 69 %
Platelets: 222 10*3/uL (ref 150–450)
RBC: 5.13 x10E6/uL (ref 4.14–5.80)
RDW: 12.8 % (ref 11.6–15.4)
Retic Ct Pct: 2.1 % (ref 0.6–2.6)
Total Iron Binding Capacity: 309 ug/dL (ref 250–450)
UIBC: 227 ug/dL (ref 111–343)
Vitamin B-12: 1678 pg/mL — ABNORMAL HIGH (ref 232–1245)
WBC: 4.6 10*3/uL (ref 3.4–10.8)

## 2019-08-30 LAB — CMP14+EGFR
ALT: 10 IU/L (ref 0–44)
AST: 18 IU/L (ref 0–40)
Albumin/Globulin Ratio: 1.6 (ref 1.2–2.2)
Albumin: 4.1 g/dL (ref 3.7–4.7)
Alkaline Phosphatase: 93 IU/L (ref 39–117)
BUN/Creatinine Ratio: 17 (ref 10–24)
BUN: 18 mg/dL (ref 8–27)
Bilirubin Total: 1 mg/dL (ref 0.0–1.2)
CO2: 21 mmol/L (ref 20–29)
Calcium: 9.2 mg/dL (ref 8.6–10.2)
Chloride: 102 mmol/L (ref 96–106)
Creatinine, Ser: 1.03 mg/dL (ref 0.76–1.27)
GFR calc Af Amer: 82 mL/min/{1.73_m2} (ref >59)
GFR calc non Af Amer: 71 mL/min/{1.73_m2} (ref >59)
Globulin, Total: 2.6 g/dL (ref 1.5–4.5)
Glucose: 228 mg/dL — ABNORMAL HIGH (ref 65–99)
Potassium: 3.9 mmol/L (ref 3.5–5.2)
Sodium: 136 mmol/L (ref 134–144)
Total Protein: 6.7 g/dL (ref 6.0–8.5)

## 2019-09-06 ENCOUNTER — Encounter: Payer: Self-pay | Admitting: *Deleted

## 2019-09-07 ENCOUNTER — Other Ambulatory Visit: Payer: Self-pay | Admitting: Cardiology

## 2019-09-07 ENCOUNTER — Ambulatory Visit: Payer: Medicare HMO | Admitting: Cardiology

## 2019-09-15 ENCOUNTER — Ambulatory Visit: Payer: Medicare HMO | Admitting: "Endocrinology

## 2019-09-16 ENCOUNTER — Ambulatory Visit: Payer: Medicare HMO | Admitting: "Endocrinology

## 2019-09-26 ENCOUNTER — Other Ambulatory Visit: Payer: Self-pay | Admitting: Family

## 2019-09-26 DIAGNOSIS — E1169 Type 2 diabetes mellitus with other specified complication: Secondary | ICD-10-CM

## 2019-09-26 DIAGNOSIS — E785 Hyperlipidemia, unspecified: Secondary | ICD-10-CM

## 2019-10-18 ENCOUNTER — Telehealth: Payer: Self-pay | Admitting: Family

## 2019-10-18 MED ORDER — PEN NEEDLES 32G X 4 MM MISC: 100.0000 | Freq: Four times a day (QID) | 1 refills | Status: DC

## 2019-10-18 NOTE — Telephone Encounter (Signed)
Pen needles sent inpatient aware.

## 2019-10-20 DIAGNOSIS — R69 Illness, unspecified: Secondary | ICD-10-CM | POA: Diagnosis not present

## 2019-12-08 ENCOUNTER — Other Ambulatory Visit: Payer: Self-pay | Admitting: Family

## 2019-12-08 ENCOUNTER — Other Ambulatory Visit: Payer: Self-pay | Admitting: Cardiology

## 2019-12-15 ENCOUNTER — Other Ambulatory Visit: Payer: Self-pay

## 2019-12-19 ENCOUNTER — Ambulatory Visit: Payer: Medicare HMO | Admitting: Family

## 2019-12-20 ENCOUNTER — Other Ambulatory Visit: Payer: Self-pay | Admitting: *Deleted

## 2019-12-20 ENCOUNTER — Telehealth: Payer: Self-pay | Admitting: Family

## 2019-12-20 DIAGNOSIS — E785 Hyperlipidemia, unspecified: Secondary | ICD-10-CM

## 2019-12-20 DIAGNOSIS — E1169 Type 2 diabetes mellitus with other specified complication: Secondary | ICD-10-CM

## 2019-12-20 MED ORDER — ATORVASTATIN CALCIUM 40 MG PO TABS: 40.0000 mg | ORAL_TABLET | Freq: Every day | ORAL | 0 refills | Status: DC

## 2019-12-20 MED ORDER — POTASSIUM CHLORIDE CRYS ER 20 MEQ PO TBCR: 20.0000 meq | EXTENDED_RELEASE_TABLET | Freq: Every day | ORAL | 0 refills | Status: DC

## 2019-12-21 NOTE — Telephone Encounter (Signed)
Last office notes were faxed to attn: alex

## 2019-12-28 ENCOUNTER — Telehealth: Payer: Self-pay | Admitting: Family

## 2019-12-28 MED ORDER — OLANZAPINE 2.5 MG PO TABS: 2.5000 mg | ORAL_TABLET | Freq: Every day | ORAL | 1 refills | Status: DC

## 2019-12-28 NOTE — Telephone Encounter (Signed)
Patient needs a refill on zyprexa.

## 2019-12-28 NOTE — Telephone Encounter (Signed)
Prescription sent to pharmacy.

## 2020-01-02 ENCOUNTER — Ambulatory Visit: Payer: Medicare HMO | Admitting: Family

## 2020-01-02 ENCOUNTER — Encounter: Payer: Self-pay | Admitting: Family

## 2020-01-07 DIAGNOSIS — R462 Strange and inexplicable behavior: Secondary | ICD-10-CM | POA: Diagnosis not present

## 2020-01-07 DIAGNOSIS — Z91199 Patient's noncompliance with other medical treatment and regimen due to unspecified reason: Secondary | ICD-10-CM | POA: Insufficient documentation

## 2020-01-07 DIAGNOSIS — Z87891 Personal history of nicotine dependence: Secondary | ICD-10-CM | POA: Diagnosis not present

## 2020-01-07 DIAGNOSIS — R69 Illness, unspecified: Secondary | ICD-10-CM | POA: Diagnosis not present

## 2020-01-07 DIAGNOSIS — I10 Essential (primary) hypertension: Secondary | ICD-10-CM | POA: Diagnosis not present

## 2020-01-07 DIAGNOSIS — F23 Brief psychotic disorder: Secondary | ICD-10-CM | POA: Diagnosis not present

## 2020-01-07 DIAGNOSIS — F02818 Dementia in other diseases classified elsewhere, unspecified severity, with other behavioral disturbance: Secondary | ICD-10-CM | POA: Insufficient documentation

## 2020-01-07 DIAGNOSIS — F0391 Unspecified dementia with behavioral disturbance: Secondary | ICD-10-CM | POA: Diagnosis not present

## 2020-01-07 DIAGNOSIS — Z9119 Patient's noncompliance with other medical treatment and regimen: Secondary | ICD-10-CM | POA: Diagnosis not present

## 2020-01-07 DIAGNOSIS — F0281 Dementia in other diseases classified elsewhere with behavioral disturbance: Secondary | ICD-10-CM | POA: Diagnosis not present

## 2020-01-07 DIAGNOSIS — F0390 Unspecified dementia without behavioral disturbance: Secondary | ICD-10-CM | POA: Diagnosis not present

## 2020-01-07 DIAGNOSIS — G3109 Other frontotemporal dementia: Secondary | ICD-10-CM | POA: Diagnosis not present

## 2020-01-07 DIAGNOSIS — E1165 Type 2 diabetes mellitus with hyperglycemia: Secondary | ICD-10-CM | POA: Diagnosis not present

## 2020-01-07 DIAGNOSIS — Z7984 Long term (current) use of oral hypoglycemic drugs: Secondary | ICD-10-CM | POA: Diagnosis not present

## 2020-01-07 DIAGNOSIS — Z888 Allergy status to other drugs, medicaments and biological substances status: Secondary | ICD-10-CM | POA: Diagnosis not present

## 2020-01-07 DIAGNOSIS — Z79899 Other long term (current) drug therapy: Secondary | ICD-10-CM | POA: Diagnosis not present

## 2020-01-07 DIAGNOSIS — Z046 Encounter for general psychiatric examination, requested by authority: Secondary | ICD-10-CM | POA: Diagnosis not present

## 2020-01-08 DIAGNOSIS — R69 Illness, unspecified: Secondary | ICD-10-CM | POA: Diagnosis not present

## 2020-01-08 DIAGNOSIS — G3109 Other frontotemporal dementia: Secondary | ICD-10-CM | POA: Diagnosis not present

## 2020-01-08 DIAGNOSIS — R462 Strange and inexplicable behavior: Secondary | ICD-10-CM | POA: Diagnosis not present

## 2020-01-08 DIAGNOSIS — Z046 Encounter for general psychiatric examination, requested by authority: Secondary | ICD-10-CM | POA: Diagnosis not present

## 2020-01-09 ENCOUNTER — Other Ambulatory Visit: Payer: Self-pay | Admitting: *Deleted

## 2020-01-09 NOTE — Telephone Encounter (Signed)
Fax from [employer] mix 75/25 KWP 9 u BID w/ meals Rxd in hospital by Dr. Haynes Kerns in [place], non-formulary Request for you to change Please advise

## 2020-01-10 MED ORDER — INSULIN ASPART 100 UNIT/ML FLEXPEN: 9.0000 [IU] | PEN_INJECTOR | Freq: Two times a day (BID) | SUBCUTANEOUS | 11 refills | Status: DC

## 2020-01-10 NOTE — Telephone Encounter (Signed)
Humalog changed to Novolog. Continue 9 units. Pt needs follow up in office.

## 2020-01-10 NOTE — Addendum Note (Signed)
Addended by: Evelina Dun A on: 01/10/2020 12:44 PM   Modules accepted: Orders

## 2020-01-11 ENCOUNTER — Encounter: Payer: Self-pay | Admitting: Family

## 2020-01-11 DIAGNOSIS — I10 Essential (primary) hypertension: Secondary | ICD-10-CM | POA: Diagnosis not present

## 2020-01-11 DIAGNOSIS — F0281 Dementia in other diseases classified elsewhere with behavioral disturbance: Secondary | ICD-10-CM | POA: Diagnosis not present

## 2020-01-11 DIAGNOSIS — R69 Illness, unspecified: Secondary | ICD-10-CM | POA: Diagnosis not present

## 2020-01-11 DIAGNOSIS — R0789 Other chest pain: Secondary | ICD-10-CM | POA: Diagnosis not present

## 2020-01-11 DIAGNOSIS — R079 Chest pain, unspecified: Secondary | ICD-10-CM | POA: Diagnosis not present

## 2020-01-11 DIAGNOSIS — Z79899 Other long term (current) drug therapy: Secondary | ICD-10-CM | POA: Diagnosis not present

## 2020-01-11 DIAGNOSIS — G3109 Other frontotemporal dementia: Secondary | ICD-10-CM | POA: Diagnosis not present

## 2020-01-11 DIAGNOSIS — E785 Hyperlipidemia, unspecified: Secondary | ICD-10-CM | POA: Diagnosis not present

## 2020-01-11 DIAGNOSIS — R0600 Dyspnea, unspecified: Secondary | ICD-10-CM | POA: Diagnosis not present

## 2020-01-11 DIAGNOSIS — R5383 Other fatigue: Secondary | ICD-10-CM | POA: Diagnosis not present

## 2020-01-11 DIAGNOSIS — Z888 Allergy status to other drugs, medicaments and biological substances status: Secondary | ICD-10-CM | POA: Diagnosis not present

## 2020-01-11 DIAGNOSIS — E119 Type 2 diabetes mellitus without complications: Secondary | ICD-10-CM | POA: Diagnosis not present

## 2020-01-11 DIAGNOSIS — R042 Hemoptysis: Secondary | ICD-10-CM | POA: Diagnosis not present

## 2020-01-11 DIAGNOSIS — R05 Cough: Secondary | ICD-10-CM | POA: Diagnosis not present

## 2020-01-11 DIAGNOSIS — J984 Other disorders of lung: Secondary | ICD-10-CM | POA: Diagnosis not present

## 2020-01-11 DIAGNOSIS — Z794 Long term (current) use of insulin: Secondary | ICD-10-CM | POA: Diagnosis not present

## 2020-01-11 DIAGNOSIS — Z87891 Personal history of nicotine dependence: Secondary | ICD-10-CM | POA: Diagnosis not present

## 2020-01-11 DIAGNOSIS — R739 Hyperglycemia, unspecified: Secondary | ICD-10-CM | POA: Diagnosis not present

## 2020-01-12 DIAGNOSIS — R05 Cough: Secondary | ICD-10-CM | POA: Diagnosis not present

## 2020-01-12 DIAGNOSIS — F0281 Dementia in other diseases classified elsewhere with behavioral disturbance: Secondary | ICD-10-CM | POA: Diagnosis not present

## 2020-01-12 DIAGNOSIS — R0600 Dyspnea, unspecified: Secondary | ICD-10-CM | POA: Diagnosis not present

## 2020-01-12 DIAGNOSIS — G3109 Other frontotemporal dementia: Secondary | ICD-10-CM | POA: Diagnosis not present

## 2020-01-12 DIAGNOSIS — R0789 Other chest pain: Secondary | ICD-10-CM | POA: Diagnosis not present

## 2020-01-12 MED ORDER — INSULIN GLARGINE 100 UNIT/ML ~~LOC~~ SOLN
1.00 | SUBCUTANEOUS | Status: DC
Start: ? — End: 2020-01-12

## 2020-01-12 MED ORDER — TRAZODONE HCL 50 MG PO TABS
50.00 | ORAL_TABLET | ORAL | Status: DC
Start: ? — End: 2020-01-12

## 2020-01-12 MED ORDER — INSULIN GLARGINE 100 UNIT/ML ~~LOC~~ SOLN
1.00 | SUBCUTANEOUS | Status: DC
Start: 2020-01-12 — End: 2020-01-12

## 2020-01-12 MED ORDER — ACETAMINOPHEN 325 MG PO TABS
650.00 | ORAL_TABLET | ORAL | Status: DC
Start: ? — End: 2020-01-12

## 2020-01-12 MED ORDER — INSULIN LISPRO 100 UNIT/ML ~~LOC~~ SOLN
1.00 | SUBCUTANEOUS | Status: DC
Start: ? — End: 2020-01-12

## 2020-01-12 MED ORDER — POLYETHYLENE GLYCOL 3350 17 GM/SCOOP PO POWD
17.00 | ORAL | Status: DC
Start: ? — End: 2020-01-12

## 2020-01-12 MED ORDER — ALUM & MAG HYDROXIDE-SIMETH 200-200-20 MG/5ML PO SUSP
30.00 | ORAL | Status: DC
Start: ? — End: 2020-01-12

## 2020-01-12 MED ORDER — POTASSIUM CHLORIDE ER 10 MEQ PO TBCR
20.00 | EXTENDED_RELEASE_TABLET | ORAL | Status: DC
Start: 2020-01-13 — End: 2020-01-12

## 2020-01-12 MED ORDER — INSULIN LISPRO 100 UNIT/ML ~~LOC~~ SOLN
1.00 | SUBCUTANEOUS | Status: DC
Start: 2020-01-12 — End: 2020-01-12

## 2020-01-12 MED ORDER — GENERIC EXTERNAL MEDICATION
Status: DC
Start: ? — End: 2020-01-12

## 2020-01-12 MED ORDER — OLANZAPINE 5 MG PO TABS
2.50 | ORAL_TABLET | ORAL | Status: DC
Start: ? — End: 2020-01-12

## 2020-01-12 MED ORDER — ATORVASTATIN CALCIUM 40 MG PO TABS
40.00 | ORAL_TABLET | ORAL | Status: DC
Start: 2020-01-13 — End: 2020-01-12

## 2020-01-12 MED ORDER — AMLODIPINE BESYLATE 10 MG PO TABS
10.00 | ORAL_TABLET | ORAL | Status: DC
Start: 2020-01-13 — End: 2020-01-12

## 2020-01-16 DIAGNOSIS — I10 Essential (primary) hypertension: Secondary | ICD-10-CM | POA: Diagnosis not present

## 2020-01-16 DIAGNOSIS — R69 Illness, unspecified: Secondary | ICD-10-CM | POA: Diagnosis not present

## 2020-01-16 DIAGNOSIS — R350 Frequency of micturition: Secondary | ICD-10-CM | POA: Diagnosis not present

## 2020-01-16 DIAGNOSIS — G3109 Other frontotemporal dementia: Secondary | ICD-10-CM | POA: Diagnosis not present

## 2020-01-16 DIAGNOSIS — C679 Malignant neoplasm of bladder, unspecified: Secondary | ICD-10-CM | POA: Diagnosis not present

## 2020-01-16 DIAGNOSIS — N401 Enlarged prostate with lower urinary tract symptoms: Secondary | ICD-10-CM | POA: Diagnosis not present

## 2020-01-16 DIAGNOSIS — E785 Hyperlipidemia, unspecified: Secondary | ICD-10-CM | POA: Diagnosis not present

## 2020-01-16 DIAGNOSIS — E1165 Type 2 diabetes mellitus with hyperglycemia: Secondary | ICD-10-CM | POA: Diagnosis not present

## 2020-01-17 ENCOUNTER — Other Ambulatory Visit: Payer: Self-pay | Admitting: Family

## 2020-01-17 DIAGNOSIS — E785 Hyperlipidemia, unspecified: Secondary | ICD-10-CM

## 2020-01-17 DIAGNOSIS — E1169 Type 2 diabetes mellitus with other specified complication: Secondary | ICD-10-CM

## 2020-01-23 ENCOUNTER — Other Ambulatory Visit: Payer: Self-pay | Admitting: Family

## 2020-01-23 DIAGNOSIS — R69 Illness, unspecified: Secondary | ICD-10-CM | POA: Diagnosis not present

## 2020-02-09 DIAGNOSIS — I10 Essential (primary) hypertension: Secondary | ICD-10-CM | POA: Diagnosis not present

## 2020-02-09 DIAGNOSIS — C679 Malignant neoplasm of bladder, unspecified: Secondary | ICD-10-CM | POA: Diagnosis not present

## 2020-02-12 ENCOUNTER — Ambulatory Visit: Payer: Medicare HMO

## 2020-02-22 ENCOUNTER — Other Ambulatory Visit: Payer: Self-pay | Admitting: *Deleted

## 2020-02-22 DIAGNOSIS — I152 Hypertension secondary to endocrine disorders: Secondary | ICD-10-CM

## 2020-02-22 DIAGNOSIS — E1159 Type 2 diabetes mellitus with other circulatory complications: Secondary | ICD-10-CM

## 2020-02-22 MED ORDER — AMLODIPINE BESYLATE 10 MG PO TABS: 10.0000 mg | ORAL_TABLET | Freq: Every day | ORAL | 0 refills | Status: DC

## 2020-03-15 ENCOUNTER — Other Ambulatory Visit: Payer: Self-pay | Admitting: Family

## 2020-03-15 DIAGNOSIS — E1159 Type 2 diabetes mellitus with other circulatory complications: Secondary | ICD-10-CM

## 2020-03-15 DIAGNOSIS — I152 Hypertension secondary to endocrine disorders: Secondary | ICD-10-CM

## 2020-03-15 NOTE — Telephone Encounter (Signed)
Patient states he is no longer a patient here.

## 2020-03-19 ENCOUNTER — Other Ambulatory Visit: Payer: Self-pay | Admitting: Family

## 2020-03-19 DIAGNOSIS — E1159 Type 2 diabetes mellitus with other circulatory complications: Secondary | ICD-10-CM

## 2020-03-19 DIAGNOSIS — I152 Hypertension secondary to endocrine disorders: Secondary | ICD-10-CM

## 2020-05-04 DIAGNOSIS — C679 Malignant neoplasm of bladder, unspecified: Secondary | ICD-10-CM | POA: Diagnosis not present

## 2020-05-07 DIAGNOSIS — I10 Essential (primary) hypertension: Secondary | ICD-10-CM | POA: Diagnosis not present

## 2020-05-07 DIAGNOSIS — E1142 Type 2 diabetes mellitus with diabetic polyneuropathy: Secondary | ICD-10-CM | POA: Insufficient documentation

## 2020-05-07 DIAGNOSIS — E1165 Type 2 diabetes mellitus with hyperglycemia: Secondary | ICD-10-CM | POA: Diagnosis not present

## 2020-05-13 ENCOUNTER — Other Ambulatory Visit: Payer: Self-pay | Admitting: Family

## 2020-05-15 ENCOUNTER — Other Ambulatory Visit: Payer: Self-pay

## 2020-05-15 MED ORDER — GLUCOSE BLOOD VI STRP: ORAL_STRIP | 12 refills | Status: DC

## 2020-05-16 ENCOUNTER — Other Ambulatory Visit: Payer: Self-pay

## 2020-05-16 DIAGNOSIS — R69 Illness, unspecified: Secondary | ICD-10-CM | POA: Diagnosis not present

## 2020-05-16 MED ORDER — PEN NEEDLES 32G X 4 MM MISC: 100.0000 | Freq: Four times a day (QID) | 5 refills | Status: DC

## 2020-06-07 DIAGNOSIS — I10 Essential (primary) hypertension: Secondary | ICD-10-CM | POA: Diagnosis not present

## 2020-06-07 DIAGNOSIS — Z6826 Body mass index (BMI) 26.0-26.9, adult: Secondary | ICD-10-CM | POA: Diagnosis not present

## 2020-06-07 DIAGNOSIS — E1143 Type 2 diabetes mellitus with diabetic autonomic (poly)neuropathy: Secondary | ICD-10-CM | POA: Diagnosis not present

## 2020-06-07 DIAGNOSIS — R69 Illness, unspecified: Secondary | ICD-10-CM | POA: Diagnosis not present

## 2020-06-07 DIAGNOSIS — Z794 Long term (current) use of insulin: Secondary | ICD-10-CM | POA: Diagnosis not present

## 2020-06-07 DIAGNOSIS — E1140 Type 2 diabetes mellitus with diabetic neuropathy, unspecified: Secondary | ICD-10-CM | POA: Diagnosis not present

## 2020-06-07 DIAGNOSIS — N400 Enlarged prostate without lower urinary tract symptoms: Secondary | ICD-10-CM | POA: Diagnosis not present

## 2020-06-07 DIAGNOSIS — N529 Male erectile dysfunction, unspecified: Secondary | ICD-10-CM | POA: Diagnosis not present

## 2020-06-07 DIAGNOSIS — E663 Overweight: Secondary | ICD-10-CM | POA: Diagnosis not present

## 2020-06-07 DIAGNOSIS — E785 Hyperlipidemia, unspecified: Secondary | ICD-10-CM | POA: Diagnosis not present

## 2020-06-08 DIAGNOSIS — R69 Illness, unspecified: Secondary | ICD-10-CM | POA: Diagnosis not present

## 2020-06-29 DIAGNOSIS — R32 Unspecified urinary incontinence: Secondary | ICD-10-CM | POA: Diagnosis not present

## 2020-06-29 DIAGNOSIS — R5383 Other fatigue: Secondary | ICD-10-CM | POA: Diagnosis not present

## 2020-06-29 DIAGNOSIS — N3000 Acute cystitis without hematuria: Secondary | ICD-10-CM | POA: Diagnosis not present

## 2020-07-11 ENCOUNTER — Other Ambulatory Visit: Payer: Self-pay | Admitting: Family

## 2020-07-23 DIAGNOSIS — J019 Acute sinusitis, unspecified: Secondary | ICD-10-CM | POA: Diagnosis not present

## 2020-07-23 DIAGNOSIS — I10 Essential (primary) hypertension: Secondary | ICD-10-CM | POA: Diagnosis not present

## 2020-08-21 DIAGNOSIS — E1165 Type 2 diabetes mellitus with hyperglycemia: Secondary | ICD-10-CM | POA: Diagnosis not present

## 2020-08-21 DIAGNOSIS — R040 Epistaxis: Secondary | ICD-10-CM | POA: Diagnosis not present

## 2020-08-21 DIAGNOSIS — I10 Essential (primary) hypertension: Secondary | ICD-10-CM | POA: Diagnosis not present

## 2020-08-21 DIAGNOSIS — E782 Mixed hyperlipidemia: Secondary | ICD-10-CM | POA: Diagnosis not present

## 2020-08-23 DIAGNOSIS — R69 Illness, unspecified: Secondary | ICD-10-CM | POA: Diagnosis not present

## 2020-08-28 DIAGNOSIS — H52223 Regular astigmatism, bilateral: Secondary | ICD-10-CM | POA: Diagnosis not present

## 2020-08-28 DIAGNOSIS — H40033 Anatomical narrow angle, bilateral: Secondary | ICD-10-CM | POA: Diagnosis not present

## 2020-08-28 DIAGNOSIS — H2513 Age-related nuclear cataract, bilateral: Secondary | ICD-10-CM | POA: Diagnosis not present

## 2020-08-28 DIAGNOSIS — E113391 Type 2 diabetes mellitus with moderate nonproliferative diabetic retinopathy without macular edema, right eye: Secondary | ICD-10-CM | POA: Diagnosis not present

## 2020-08-28 DIAGNOSIS — Z7984 Long term (current) use of oral hypoglycemic drugs: Secondary | ICD-10-CM | POA: Diagnosis not present

## 2020-08-28 DIAGNOSIS — H04123 Dry eye syndrome of bilateral lacrimal glands: Secondary | ICD-10-CM | POA: Diagnosis not present

## 2020-08-28 DIAGNOSIS — H524 Presbyopia: Secondary | ICD-10-CM | POA: Diagnosis not present

## 2020-10-16 DIAGNOSIS — R69 Illness, unspecified: Secondary | ICD-10-CM | POA: Diagnosis not present

## 2020-10-23 DIAGNOSIS — R69 Illness, unspecified: Secondary | ICD-10-CM | POA: Diagnosis not present

## 2020-11-06 DIAGNOSIS — R69 Illness, unspecified: Secondary | ICD-10-CM | POA: Diagnosis not present

## 2020-11-20 DIAGNOSIS — B351 Tinea unguium: Secondary | ICD-10-CM | POA: Diagnosis not present

## 2020-11-20 DIAGNOSIS — E1142 Type 2 diabetes mellitus with diabetic polyneuropathy: Secondary | ICD-10-CM | POA: Diagnosis not present

## 2020-11-20 DIAGNOSIS — L84 Corns and callosities: Secondary | ICD-10-CM | POA: Diagnosis not present

## 2020-11-20 DIAGNOSIS — M79676 Pain in unspecified toe(s): Secondary | ICD-10-CM | POA: Diagnosis not present

## 2020-12-05 ENCOUNTER — Other Ambulatory Visit: Payer: Self-pay | Admitting: Family

## 2021-02-05 DIAGNOSIS — R0602 Shortness of breath: Secondary | ICD-10-CM | POA: Diagnosis not present

## 2021-02-05 DIAGNOSIS — E0865 Diabetes mellitus due to underlying condition with hyperglycemia: Secondary | ICD-10-CM | POA: Diagnosis not present

## 2021-02-05 DIAGNOSIS — Z888 Allergy status to other drugs, medicaments and biological substances status: Secondary | ICD-10-CM | POA: Diagnosis not present

## 2021-02-05 DIAGNOSIS — I10 Essential (primary) hypertension: Secondary | ICD-10-CM | POA: Diagnosis not present

## 2021-02-05 DIAGNOSIS — R072 Precordial pain: Secondary | ICD-10-CM | POA: Diagnosis not present

## 2021-02-05 DIAGNOSIS — E1165 Type 2 diabetes mellitus with hyperglycemia: Secondary | ICD-10-CM | POA: Diagnosis not present

## 2021-02-05 DIAGNOSIS — Z87891 Personal history of nicotine dependence: Secondary | ICD-10-CM | POA: Diagnosis not present

## 2021-02-05 DIAGNOSIS — Z794 Long term (current) use of insulin: Secondary | ICD-10-CM | POA: Diagnosis not present

## 2021-02-05 DIAGNOSIS — R0789 Other chest pain: Secondary | ICD-10-CM | POA: Diagnosis not present

## 2021-02-05 DIAGNOSIS — R079 Chest pain, unspecified: Secondary | ICD-10-CM | POA: Diagnosis not present

## 2021-02-05 DIAGNOSIS — Z79899 Other long term (current) drug therapy: Secondary | ICD-10-CM | POA: Diagnosis not present

## 2021-02-12 DIAGNOSIS — L84 Corns and callosities: Secondary | ICD-10-CM | POA: Diagnosis not present

## 2021-02-12 DIAGNOSIS — M79676 Pain in unspecified toe(s): Secondary | ICD-10-CM | POA: Diagnosis not present

## 2021-02-12 DIAGNOSIS — E1142 Type 2 diabetes mellitus with diabetic polyneuropathy: Secondary | ICD-10-CM | POA: Diagnosis not present

## 2021-02-12 DIAGNOSIS — B351 Tinea unguium: Secondary | ICD-10-CM | POA: Diagnosis not present

## 2021-03-05 DIAGNOSIS — E1165 Type 2 diabetes mellitus with hyperglycemia: Secondary | ICD-10-CM | POA: Diagnosis not present

## 2021-03-05 DIAGNOSIS — R69 Illness, unspecified: Secondary | ICD-10-CM | POA: Diagnosis not present

## 2021-03-05 DIAGNOSIS — N529 Male erectile dysfunction, unspecified: Secondary | ICD-10-CM | POA: Insufficient documentation

## 2021-03-05 DIAGNOSIS — R079 Chest pain, unspecified: Secondary | ICD-10-CM | POA: Diagnosis not present

## 2021-03-05 DIAGNOSIS — G3109 Other frontotemporal dementia: Secondary | ICD-10-CM | POA: Diagnosis not present

## 2021-03-05 DIAGNOSIS — I10 Essential (primary) hypertension: Secondary | ICD-10-CM | POA: Diagnosis not present

## 2021-03-18 DIAGNOSIS — I10 Essential (primary) hypertension: Secondary | ICD-10-CM | POA: Diagnosis not present

## 2021-03-18 DIAGNOSIS — R69 Illness, unspecified: Secondary | ICD-10-CM | POA: Diagnosis not present

## 2021-03-18 DIAGNOSIS — R32 Unspecified urinary incontinence: Secondary | ICD-10-CM | POA: Diagnosis not present

## 2021-03-18 DIAGNOSIS — N529 Male erectile dysfunction, unspecified: Secondary | ICD-10-CM | POA: Diagnosis not present

## 2021-03-18 DIAGNOSIS — N400 Enlarged prostate without lower urinary tract symptoms: Secondary | ICD-10-CM | POA: Diagnosis not present

## 2021-03-18 DIAGNOSIS — I499 Cardiac arrhythmia, unspecified: Secondary | ICD-10-CM | POA: Diagnosis not present

## 2021-03-18 DIAGNOSIS — Z794 Long term (current) use of insulin: Secondary | ICD-10-CM | POA: Diagnosis not present

## 2021-03-18 DIAGNOSIS — E1143 Type 2 diabetes mellitus with diabetic autonomic (poly)neuropathy: Secondary | ICD-10-CM | POA: Diagnosis not present

## 2021-03-18 DIAGNOSIS — Z823 Family history of stroke: Secondary | ICD-10-CM | POA: Diagnosis not present

## 2021-03-18 DIAGNOSIS — Z803 Family history of malignant neoplasm of breast: Secondary | ICD-10-CM | POA: Diagnosis not present

## 2021-04-11 DIAGNOSIS — I10 Essential (primary) hypertension: Secondary | ICD-10-CM | POA: Diagnosis not present

## 2021-04-11 DIAGNOSIS — R079 Chest pain, unspecified: Secondary | ICD-10-CM | POA: Diagnosis not present

## 2021-04-11 DIAGNOSIS — E1165 Type 2 diabetes mellitus with hyperglycemia: Secondary | ICD-10-CM | POA: Diagnosis not present

## 2021-04-16 DIAGNOSIS — I10 Essential (primary) hypertension: Secondary | ICD-10-CM | POA: Diagnosis not present

## 2021-04-16 DIAGNOSIS — E1142 Type 2 diabetes mellitus with diabetic polyneuropathy: Secondary | ICD-10-CM | POA: Diagnosis not present

## 2021-04-16 DIAGNOSIS — E1165 Type 2 diabetes mellitus with hyperglycemia: Secondary | ICD-10-CM | POA: Diagnosis not present

## 2021-04-18 DIAGNOSIS — E1165 Type 2 diabetes mellitus with hyperglycemia: Secondary | ICD-10-CM | POA: Diagnosis not present

## 2021-04-18 DIAGNOSIS — I10 Essential (primary) hypertension: Secondary | ICD-10-CM | POA: Diagnosis not present

## 2021-04-18 DIAGNOSIS — R079 Chest pain, unspecified: Secondary | ICD-10-CM | POA: Diagnosis not present

## 2021-06-25 DIAGNOSIS — E1159 Type 2 diabetes mellitus with other circulatory complications: Secondary | ICD-10-CM | POA: Insufficient documentation

## 2021-06-25 DIAGNOSIS — E1165 Type 2 diabetes mellitus with hyperglycemia: Secondary | ICD-10-CM | POA: Diagnosis not present

## 2021-06-25 DIAGNOSIS — E1142 Type 2 diabetes mellitus with diabetic polyneuropathy: Secondary | ICD-10-CM | POA: Diagnosis not present

## 2021-06-25 DIAGNOSIS — I152 Hypertension secondary to endocrine disorders: Secondary | ICD-10-CM | POA: Insufficient documentation

## 2021-06-25 DIAGNOSIS — I10 Essential (primary) hypertension: Secondary | ICD-10-CM | POA: Diagnosis not present

## 2021-08-13 DIAGNOSIS — I10 Essential (primary) hypertension: Secondary | ICD-10-CM | POA: Diagnosis not present

## 2021-08-13 DIAGNOSIS — R350 Frequency of micturition: Secondary | ICD-10-CM | POA: Diagnosis not present

## 2021-08-15 ENCOUNTER — Emergency Department (HOSPITAL_COMMUNITY): Payer: Medicare HMO

## 2021-08-15 ENCOUNTER — Encounter (HOSPITAL_COMMUNITY): Payer: Self-pay

## 2021-08-15 ENCOUNTER — Inpatient Hospital Stay (HOSPITAL_COMMUNITY)
Admission: EM | Admit: 2021-08-15 | Discharge: 2021-08-30 | DRG: 234 | Disposition: A | Discharge status: Home Health | Payer: Medicare HMO | Attending: Thoracic Surgery (Cardiothoracic Vascular Surgery) | Admitting: Thoracic Surgery (Cardiothoracic Vascular Surgery)

## 2021-08-15 DIAGNOSIS — E1159 Type 2 diabetes mellitus with other circulatory complications: Secondary | ICD-10-CM | POA: Diagnosis not present

## 2021-08-15 DIAGNOSIS — R1310 Dysphagia, unspecified: Secondary | ICD-10-CM | POA: Diagnosis not present

## 2021-08-15 DIAGNOSIS — Z823 Family history of stroke: Secondary | ICD-10-CM

## 2021-08-15 DIAGNOSIS — N400 Enlarged prostate without lower urinary tract symptoms: Secondary | ICD-10-CM | POA: Diagnosis not present

## 2021-08-15 DIAGNOSIS — J9811 Atelectasis: Secondary | ICD-10-CM | POA: Diagnosis not present

## 2021-08-15 DIAGNOSIS — E785 Hyperlipidemia, unspecified: Secondary | ICD-10-CM | POA: Diagnosis present

## 2021-08-15 DIAGNOSIS — Z87891 Personal history of nicotine dependence: Secondary | ICD-10-CM

## 2021-08-15 DIAGNOSIS — I214 Non-ST elevation (NSTEMI) myocardial infarction: Secondary | ICD-10-CM

## 2021-08-15 DIAGNOSIS — R0789 Other chest pain: Secondary | ICD-10-CM | POA: Diagnosis not present

## 2021-08-15 DIAGNOSIS — R0689 Other abnormalities of breathing: Secondary | ICD-10-CM | POA: Diagnosis not present

## 2021-08-15 DIAGNOSIS — J449 Chronic obstructive pulmonary disease, unspecified: Secondary | ICD-10-CM | POA: Diagnosis not present

## 2021-08-15 DIAGNOSIS — I2109 ST elevation (STEMI) myocardial infarction involving other coronary artery of anterior wall: Secondary | ICD-10-CM | POA: Diagnosis present

## 2021-08-15 DIAGNOSIS — R079 Chest pain, unspecified: Secondary | ICD-10-CM | POA: Diagnosis not present

## 2021-08-15 DIAGNOSIS — N179 Acute kidney failure, unspecified: Secondary | ICD-10-CM

## 2021-08-15 DIAGNOSIS — G4440 Drug-induced headache, not elsewhere classified, not intractable: Secondary | ICD-10-CM | POA: Diagnosis not present

## 2021-08-15 DIAGNOSIS — Z4682 Encounter for fitting and adjustment of non-vascular catheter: Secondary | ICD-10-CM

## 2021-08-15 DIAGNOSIS — J939 Pneumothorax, unspecified: Secondary | ICD-10-CM

## 2021-08-15 DIAGNOSIS — Z4659 Encounter for fitting and adjustment of other gastrointestinal appliance and device: Secondary | ICD-10-CM

## 2021-08-15 DIAGNOSIS — Z951 Presence of aortocoronary bypass graft: Secondary | ICD-10-CM

## 2021-08-15 DIAGNOSIS — R911 Solitary pulmonary nodule: Secondary | ICD-10-CM | POA: Diagnosis not present

## 2021-08-15 DIAGNOSIS — E11649 Type 2 diabetes mellitus with hypoglycemia without coma: Secondary | ICD-10-CM | POA: Diagnosis not present

## 2021-08-15 DIAGNOSIS — E8770 Fluid overload, unspecified: Secondary | ICD-10-CM | POA: Diagnosis not present

## 2021-08-15 DIAGNOSIS — J439 Emphysema, unspecified: Secondary | ICD-10-CM | POA: Diagnosis not present

## 2021-08-15 DIAGNOSIS — K567 Ileus, unspecified: Secondary | ICD-10-CM | POA: Diagnosis not present

## 2021-08-15 DIAGNOSIS — Z7982 Long term (current) use of aspirin: Secondary | ICD-10-CM

## 2021-08-15 DIAGNOSIS — Z79899 Other long term (current) drug therapy: Secondary | ICD-10-CM

## 2021-08-15 DIAGNOSIS — Z888 Allergy status to other drugs, medicaments and biological substances status: Secondary | ICD-10-CM

## 2021-08-15 DIAGNOSIS — R0902 Hypoxemia: Secondary | ICD-10-CM | POA: Diagnosis not present

## 2021-08-15 DIAGNOSIS — I25110 Atherosclerotic heart disease of native coronary artery with unstable angina pectoris: Secondary | ICD-10-CM | POA: Diagnosis present

## 2021-08-15 DIAGNOSIS — E1169 Type 2 diabetes mellitus with other specified complication: Secondary | ICD-10-CM | POA: Diagnosis present

## 2021-08-15 DIAGNOSIS — E8809 Other disorders of plasma-protein metabolism, not elsewhere classified: Secondary | ICD-10-CM | POA: Diagnosis not present

## 2021-08-15 DIAGNOSIS — E46 Unspecified protein-calorie malnutrition: Secondary | ICD-10-CM

## 2021-08-15 DIAGNOSIS — T463X5A Adverse effect of coronary vasodilators, initial encounter: Secondary | ICD-10-CM | POA: Diagnosis not present

## 2021-08-15 DIAGNOSIS — J90 Pleural effusion, not elsewhere classified: Secondary | ICD-10-CM

## 2021-08-15 DIAGNOSIS — Z794 Long term (current) use of insulin: Secondary | ICD-10-CM

## 2021-08-15 DIAGNOSIS — I700 Atherosclerosis of aorta: Secondary | ICD-10-CM | POA: Diagnosis present

## 2021-08-15 DIAGNOSIS — Z0189 Encounter for other specified special examinations: Secondary | ICD-10-CM

## 2021-08-15 DIAGNOSIS — D62 Acute posthemorrhagic anemia: Secondary | ICD-10-CM | POA: Diagnosis not present

## 2021-08-15 DIAGNOSIS — Z978 Presence of other specified devices: Secondary | ICD-10-CM

## 2021-08-15 DIAGNOSIS — I493 Ventricular premature depolarization: Secondary | ICD-10-CM | POA: Diagnosis not present

## 2021-08-15 DIAGNOSIS — Z8249 Family history of ischemic heart disease and other diseases of the circulatory system: Secondary | ICD-10-CM

## 2021-08-15 DIAGNOSIS — K429 Umbilical hernia without obstruction or gangrene: Secondary | ICD-10-CM | POA: Diagnosis present

## 2021-08-15 DIAGNOSIS — E441 Mild protein-calorie malnutrition: Secondary | ICD-10-CM | POA: Diagnosis present

## 2021-08-15 DIAGNOSIS — I152 Hypertension secondary to endocrine disorders: Secondary | ICD-10-CM | POA: Diagnosis not present

## 2021-08-15 DIAGNOSIS — Z743 Need for continuous supervision: Secondary | ICD-10-CM | POA: Diagnosis not present

## 2021-08-15 DIAGNOSIS — Z7984 Long term (current) use of oral hypoglycemic drugs: Secondary | ICD-10-CM

## 2021-08-15 DIAGNOSIS — E1165 Type 2 diabetes mellitus with hyperglycemia: Secondary | ICD-10-CM | POA: Diagnosis not present

## 2021-08-15 DIAGNOSIS — Z20822 Contact with and (suspected) exposure to covid-19: Secondary | ICD-10-CM | POA: Diagnosis present

## 2021-08-15 DIAGNOSIS — I2584 Coronary atherosclerosis due to calcified coronary lesion: Secondary | ICD-10-CM | POA: Diagnosis present

## 2021-08-15 DIAGNOSIS — Z8551 Personal history of malignant neoplasm of bladder: Secondary | ICD-10-CM

## 2021-08-15 DIAGNOSIS — D72829 Elevated white blood cell count, unspecified: Secondary | ICD-10-CM | POA: Diagnosis not present

## 2021-08-15 LAB — COMPREHENSIVE METABOLIC PANEL
ALT: 17 U/L (ref 0–44)
AST: 23 U/L (ref 15–41)
Albumin: 3.3 g/dL — ABNORMAL LOW (ref 3.5–5.0)
Alkaline Phosphatase: 93 U/L (ref 38–126)
Anion gap: 5 (ref 5–15)
BUN: 24 mg/dL — ABNORMAL HIGH (ref 8–23)
CO2: 22 mmol/L (ref 22–32)
Calcium: 8.2 mg/dL — ABNORMAL LOW (ref 8.9–10.3)
Chloride: 107 mmol/L (ref 98–111)
Creatinine, Ser: 1.3 mg/dL — ABNORMAL HIGH (ref 0.61–1.24)
GFR, Estimated: 57 mL/min — ABNORMAL LOW (ref >60)
Glucose, Bld: 205 mg/dL — ABNORMAL HIGH (ref 70–99)
Potassium: 4.2 mmol/L (ref 3.5–5.1)
Sodium: 134 mmol/L — ABNORMAL LOW (ref 135–145)
Total Bilirubin: 0.4 mg/dL (ref 0.3–1.2)
Total Protein: 7.1 g/dL (ref 6.5–8.1)

## 2021-08-15 LAB — CBC WITH DIFFERENTIAL/PLATELET
Abs Immature Granulocytes: 0.02 10*3/uL (ref 0.00–0.07)
Basophils Absolute: 0 10*3/uL (ref 0.0–0.1)
Basophils Relative: 0 %
Eosinophils Absolute: 0 10*3/uL (ref 0.0–0.5)
Eosinophils Relative: 0 %
HCT: 41.6 % (ref 39.0–52.0)
Hemoglobin: 13.2 g/dL (ref 13.0–17.0)
Immature Granulocytes: 0 %
Lymphocytes Relative: 11 %
Lymphs Abs: 0.6 10*3/uL — ABNORMAL LOW (ref 0.7–4.0)
MCH: 28.3 pg (ref 26.0–34.0)
MCHC: 31.7 g/dL (ref 30.0–36.0)
MCV: 89.3 fL (ref 80.0–100.0)
Monocytes Absolute: 0.3 10*3/uL (ref 0.1–1.0)
Monocytes Relative: 6 %
Neutro Abs: 4.5 10*3/uL (ref 1.7–7.7)
Neutrophils Relative %: 83 %
Platelets: 283 10*3/uL (ref 150–400)
RBC: 4.66 MIL/uL (ref 4.22–5.81)
RDW: 14 % (ref 11.5–15.5)
WBC: 5.5 10*3/uL (ref 4.0–10.5)
nRBC: 0 % (ref 0.0–0.2)

## 2021-08-15 LAB — TROPONIN I (HIGH SENSITIVITY)
Troponin I (High Sensitivity): 14 ng/L (ref <18)
Troponin I (High Sensitivity): 28 ng/L — ABNORMAL HIGH (ref <18)

## 2021-08-15 LAB — BRAIN NATRIURETIC PEPTIDE: B Natriuretic Peptide: 31 pg/mL (ref 0.0–100.0)

## 2021-08-15 LAB — CBG MONITORING, ED: Glucose-Capillary: 202 mg/dL — ABNORMAL HIGH (ref 70–99)

## 2021-08-15 IMAGING — CT CT ANGIO CHEST
2 of 7 series · 18 of 46 positions shown · IV contrast (Omnipaque or Isovue)
Comparison: None.

CLINICAL DATA: Chest pain today, nitro given at [HOSPITAL] without
relief. Patient poor historian. Rule out pulmonary embolus.

EXAM:
CT ANGIOGRAPHY CHEST WITH CONTRAST
TECHNIQUE: Multidetector CT imaging of the chest was performed using the
standard protocol during bolus administration of intravenous
contrast. Multiplanar CT image reconstructions and MIPs were
obtained to evaluate the vascular anatomy.
CONTRAST:  75mL OMNIPAQUE IOHEXOL 350 MG/ML SOLN

[Series 5: pe axial thins · axial · 0.65mm/px · z∈[+1047,+1269]mm · 15 of 313 slices shown]
[im 18/313  lung]
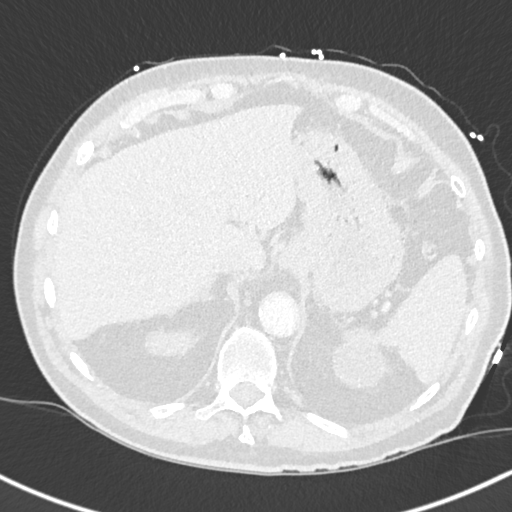
[im 35/313  soft-tissue]
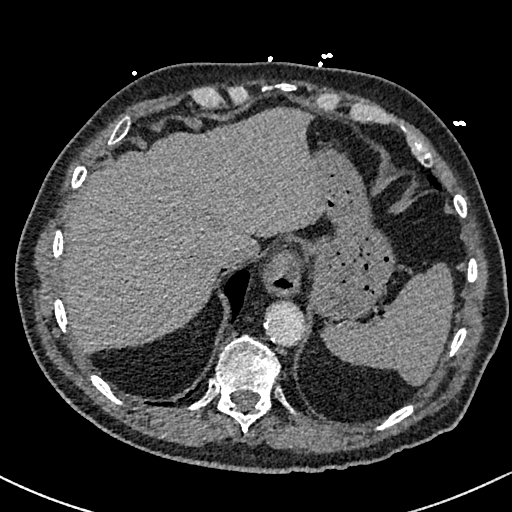
[im 53/313  lung]
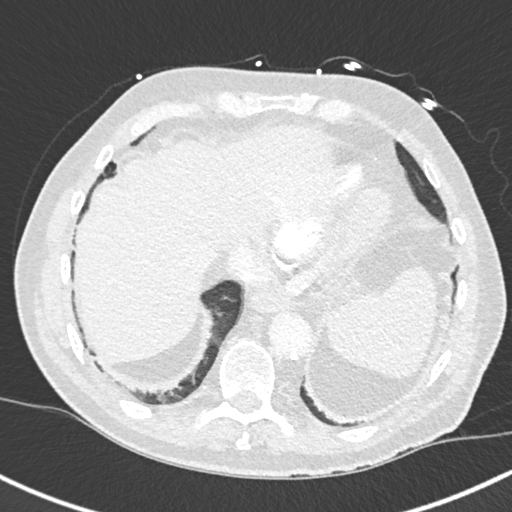
[im 70/313  soft-tissue]
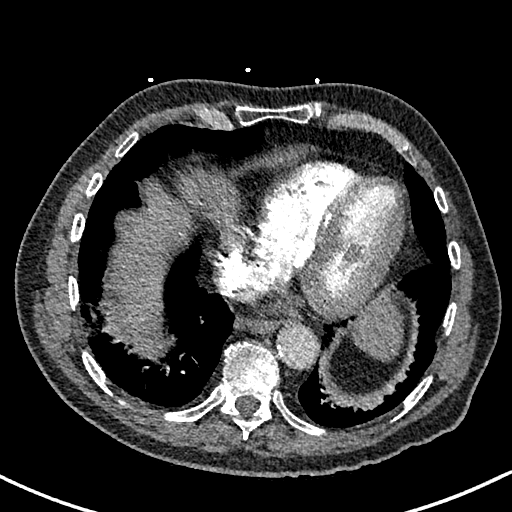
[im 105/313  lung]
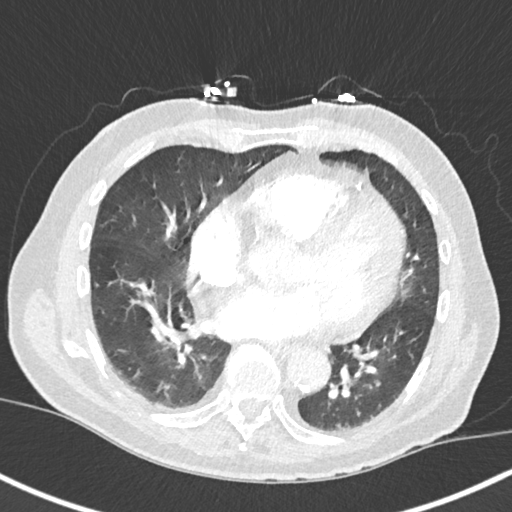
[im 122/313  soft-tissue]
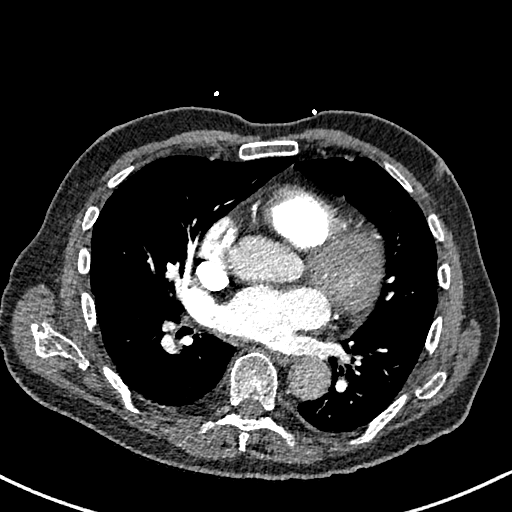
[im 139/313  lung]
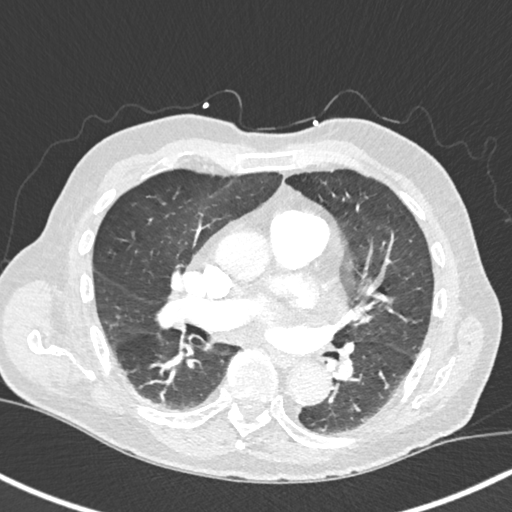
[im 157/313  soft-tissue]
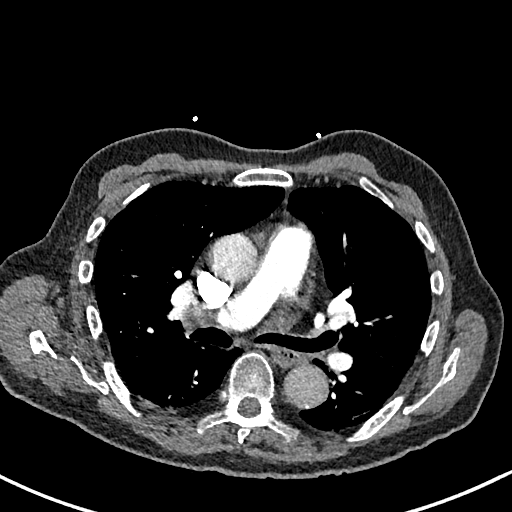
[im 174/313  lung]
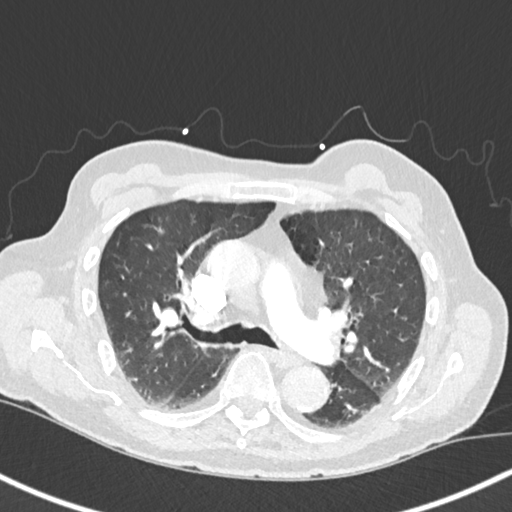
[im 191/313  soft-tissue]
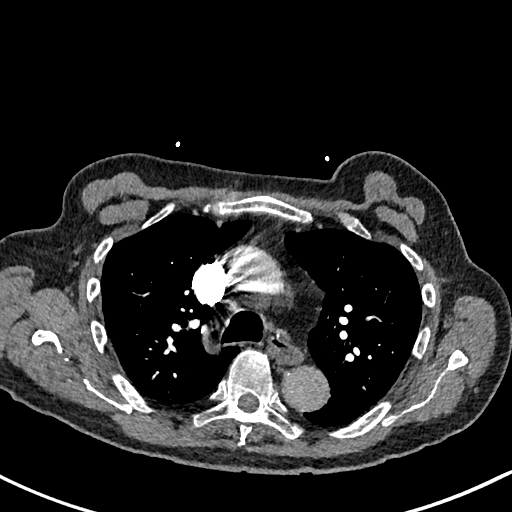
[im 209/313  lung]
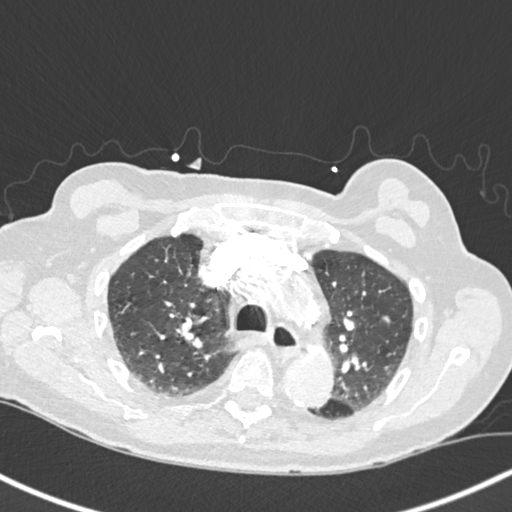
[im 243/313  soft-tissue]
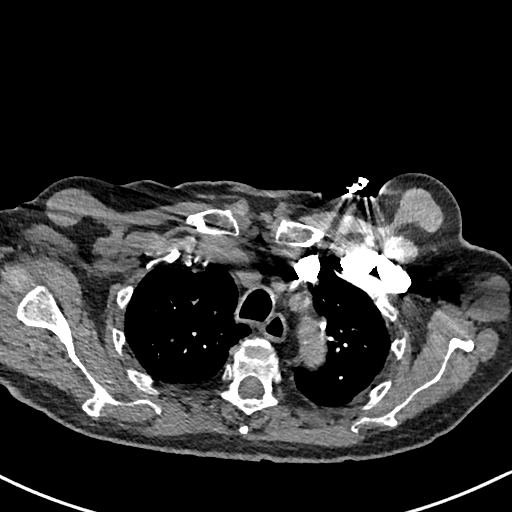
[im 261/313  lung]
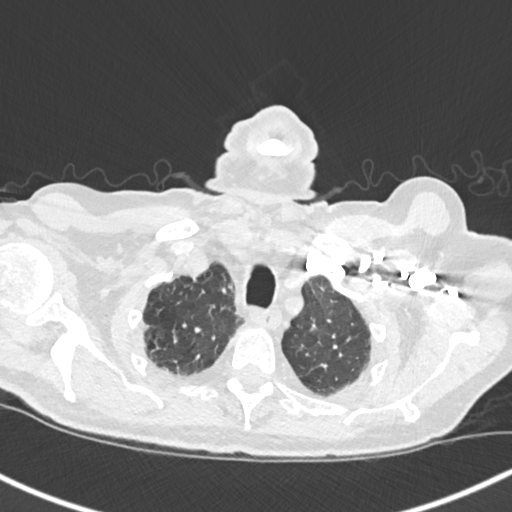
[im 278/313  soft-tissue]
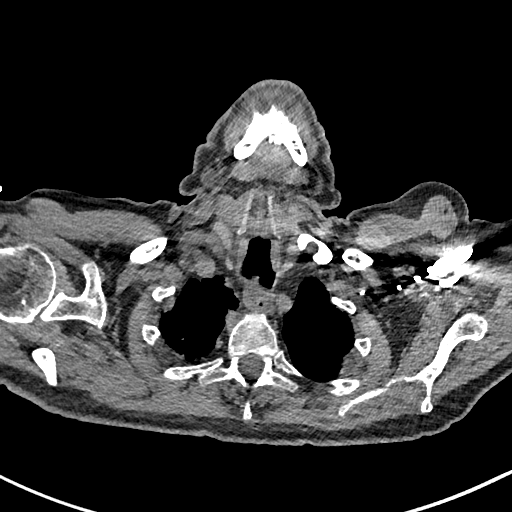
[im 295/313  lung]
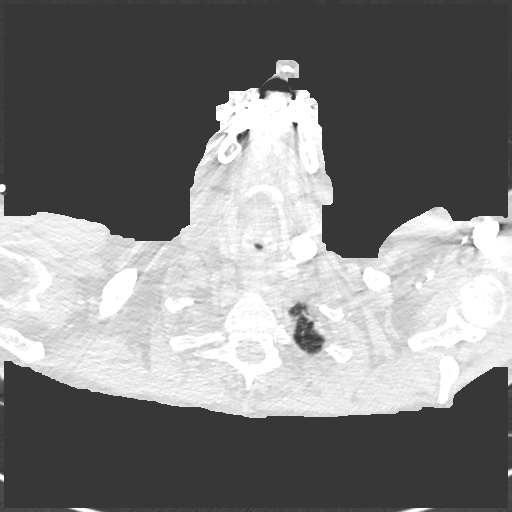

[Series 7: cor soft · coronal · 0.49mm/px · 3 of 151 slices shown]
[im 38/151  soft-tissue]
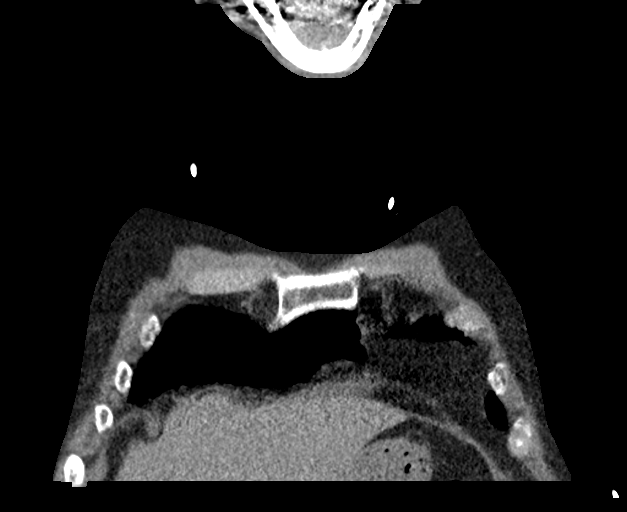
[im 76/151  soft-tissue]
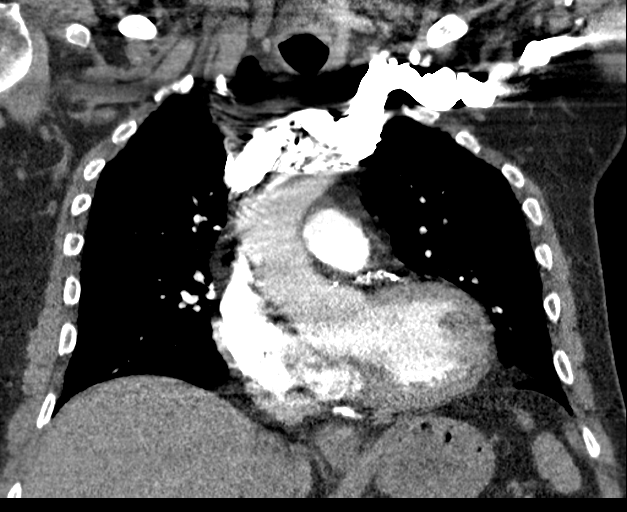
[im 113/151  soft-tissue]
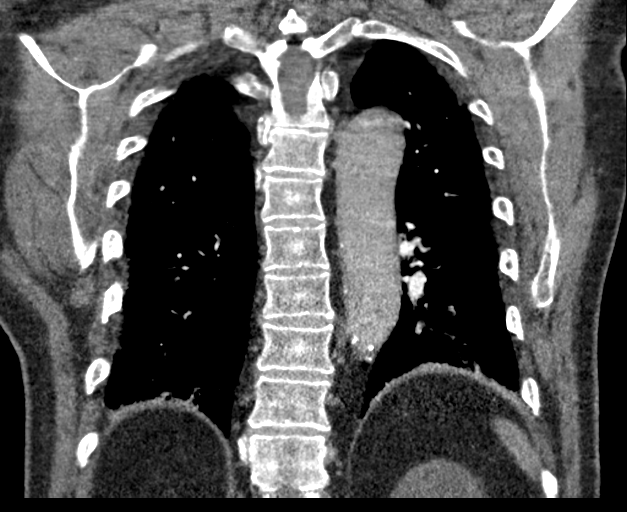

[18 of 46 positions shown; findings below may reference images not displayed]

FINDINGS: Cardiovascular: Satisfactory opacification of the pulmonary arteries
to the segmental level. Limited evaluation of the subsegmental
levels due to motion artifact. No evidence of pulmonary embolism.
Normal heart size. No significant pericardial effusion. The thoracic
aorta is normal in caliber. Moderate atherosclerotic plaque of the
thoracic aorta. Four-vessel coronary artery calcifications.

Mediastinum/Nodes: Borderline enlarged 1 cm right lymph node. No
enlarged mediastinal, hilar, or axillary lymph nodes. Slightly
patulous distal esophagus. Otherwise thyroid gland, trachea, and
esophagus demonstrate no significant findings.

Lungs/Pleura: Biapical pleural/pulmonary scarring. Bibasilar linear
atelectasis slightly elevated left hemidiaphragm. No focal
consolidation. Right upper lobe nodular like subsolid density
measuring up to 5 mm ([DATE], [DATE]). No pulmonary mass. No pleural
effusion. No pneumothorax.

Upper Abdomen: Partially visualized fluid density lesion within the
left kidney. No acute abnormality.

Musculoskeletal:

Bilateral trace gynecomastia.  No chest wall abnormality.

No suspicious lytic or blastic osseous lesions. No acute displaced
fracture.

Review of the MIP images confirms the above findings.
IMPRESSION: 1. No central or segmental pulmonary embolus. Limited evaluation at
the subsegmental level due to motion artifact.
2. A 5 mm right upper lobe subsolid nodular-like density.
Non-contrast chest CT at 3-6 months is recommended. If nodules
persist and are stable at that time, consider additional
non-contrast chest CT examinations at 2 and 4 years. This
recommendation follows the consensus statement: Guidelines for
Management of Incidental Pulmonary Nodules Detected on CT Images:
3. Nonspecific borderline enlarged 1 cm right lymph node.
4.  Aortic Atherosclerosis ([IG]-[IG]).

## 2021-08-15 MED ORDER — IOHEXOL 350 MG/ML SOLN
75.0000 mL | Freq: Once | INTRAVENOUS | Status: AC | PRN
  Administered 2021-08-15: 75 mL via INTRAVENOUS

## 2021-08-15 MED ORDER — ASPIRIN EC 81 MG PO TBEC
81.0000 mg | DELAYED_RELEASE_TABLET | Freq: Every day | ORAL | Status: DC
  Administered 2021-08-16 – 2021-08-21 (×6): 81 mg via ORAL
  Filled 2021-08-15 (×6): qty 1

## 2021-08-15 MED ORDER — INSULIN ASPART 100 UNIT/ML IJ SOLN
0.0000 [IU] | Freq: Every day | INTRAMUSCULAR | Status: DC
  Administered 2021-08-15: 2 [IU] via SUBCUTANEOUS
  Administered 2021-08-16: 3 [IU] via SUBCUTANEOUS
  Administered 2021-08-18: 2 [IU] via SUBCUTANEOUS
  Administered 2021-08-19: 3 [IU] via SUBCUTANEOUS
  Administered 2021-08-21: 2 [IU] via SUBCUTANEOUS
  Filled 2021-08-15: qty 1

## 2021-08-15 MED ORDER — INSULIN ASPART 100 UNIT/ML IJ SOLN
0.0000 [IU] | Freq: Three times a day (TID) | INTRAMUSCULAR | Status: DC
  Administered 2021-08-17: 1 [IU] via SUBCUTANEOUS
  Administered 2021-08-17 – 2021-08-18 (×3): 2 [IU] via SUBCUTANEOUS
  Administered 2021-08-18 (×2): 1 [IU] via SUBCUTANEOUS
  Administered 2021-08-19: 2 [IU] via SUBCUTANEOUS
  Administered 2021-08-19: 1 [IU] via SUBCUTANEOUS
  Administered 2021-08-19: 5 [IU] via SUBCUTANEOUS

## 2021-08-15 MED ORDER — ASPIRIN 81 MG PO CHEW
324.0000 mg | CHEWABLE_TABLET | Freq: Once | ORAL | Status: AC
  Administered 2021-08-15: 324 mg via ORAL
  Filled 2021-08-15: qty 4

## 2021-08-15 MED ORDER — SODIUM CHLORIDE 0.9 % IV SOLN: Freq: Once | INTRAVENOUS | Status: AC

## 2021-08-15 MED ORDER — ACETAMINOPHEN 325 MG PO TABS
650.0000 mg | ORAL_TABLET | Freq: Four times a day (QID) | ORAL | Status: DC | PRN
  Administered 2021-08-16: 650 mg via ORAL
  Filled 2021-08-15: qty 2

## 2021-08-15 MED ORDER — GLUCERNA SHAKE PO LIQD
237.0000 mL | Freq: Three times a day (TID) | ORAL | Status: DC
  Administered 2021-08-16 – 2021-08-21 (×14): 237 mL via ORAL
  Filled 2021-08-15 (×2): qty 237

## 2021-08-15 MED ORDER — LACTATED RINGERS IV BOLUS
500.0000 mL | Freq: Once | INTRAVENOUS | Status: AC
  Administered 2021-08-15: 500 mL via INTRAVENOUS

## 2021-08-15 MED ORDER — NITROGLYCERIN 0.4 MG SL SUBL
0.4000 mg | SUBLINGUAL_TABLET | SUBLINGUAL | Status: DC | PRN
  Administered 2021-08-16 (×3): 0.4 mg via SUBLINGUAL
  Filled 2021-08-15: qty 1

## 2021-08-15 NOTE — H&P (Signed)
History and Physical  Robert Osborne ERX:[id number] DOB: 1946-10-10 DOA: 08/15/2021  Referring physician: Teressa Lower, MD PCP: System, Provider Not In  Patient coming from: Home  Chief Complaint: Chest pain  HPI: Robert Osborne is a 75 y.o. male with medical history significant for hypertension, hyperlipidemia, BPH, T2 DM who presents to the emergency department accompanied by wife due to several day onset of chest pain which suddenly got worsened today.  Chest pain was diffuse and was described as dull sensation which was tender to touch, patient also described a sensation of someone sitting on his chest with radiation to left hand which became weak.  Chest pain worsens with exertion and usually improves on rest except today.  Patient states that he took 1 nitroglycerin lingual at home without much relief, so EMS was activated and was taken to the ED for further evaluation and management.  ED Course:  In the emergency department, he was intermittently tachypneic and bradycardic.  Work-up in the ED showed normal CBC, BUNs/creatinine 24/1.30 (baseline creatinine at 1.0).  CBG 205.  Troponin x2 -14 > 28 CT angiography of chest with contrast showed no central or segmental pulmonary embolus.  A 5 mm right upper lobe soft solid nodular-like density noted.  Chest x-ray showed no acute abnormalities. IV hydration was provided.  Hospitalist was asked to admit patient for further evaluation and management.  Review of Systems: Constitutional: Negative for chills and fever.  HENT: Negative for ear pain and sore throat.   Eyes: Negative for pain and visual disturbance.  Respiratory: Negative for cough, chest tightness and shortness of breath.   Cardiovascular: Positive for chest pain and negative for palpitations.  Gastrointestinal: Negative for abdominal pain and vomiting.  Endocrine: Negative for polyphagia and polyuria.  Genitourinary: Negative for decreased urine volume, dysuria,  enuresis Musculoskeletal: Negative for arthralgias and back pain.  Skin: Negative for color change and rash.  Allergic/Immunologic: Negative for immunocompromised state.  Neurological: Negative for tremors, syncope, speech difficulty Hematological: Does not bruise/bleed easily.  All other systems reviewed and are negative   Past Medical History:  Diagnosis Date   Bladder cancer ([place]) 08/18/2013   Cancer ([place])    papillary urethral carcinoma,low grade, non-invasive   Diabetes mellitus without complication ([place])    Erectile dysfunction 10/24/2016   Hyperlipidemia associated with type 2 diabetes mellitus ([place]) 05/17/2013   Hypertension associated with diabetes ([place])    Hypokalemia 11/27/2017   Hypomagnesemia 11/27/2017   Major frontotemporal neurocognitive disorder, probable, with behavioral disturbance 11/27/2017   Vitamin D deficiency 05/17/2013   Past Surgical History:  Procedure Laterality Date   COLONOSCOPY W/ POLYPECTOMY      Social History:  reports that he quit smoking about 48 years ago. He has never used smokeless tobacco. He reports that he does not drink alcohol and does not use drugs.   Allergies  Allergen Reactions   Enalapril Cough    Family History  Problem Relation Age of Onset   Heart attack Maternal Grandmother    Stroke Maternal Grandfather    Heart attack Mother 17     Prior to Admission medications   Medication Sig Start Date End Date Taking? Authorizing Provider  amLODipine (NORVASC) 10 MG tablet Take 1 tablet (10 mg total) by mouth daily. (Needs to be seen before next refill) 02/22/20  Yes Hawks, Christy A, FNP  aspirin 325 MG tablet Take 1 tablet (325 mg total) by mouth daily. 08/20/19  Yes Johnson, Clanford L, MD  insulin aspart (NOVOLOG)  100 UNIT/ML FlexPen Inject 9 Units into the skin 2 (two) times daily with a meal. 01/10/20  Yes Hawks, Christy A, FNP  insulin degludec (TRESIBA FLEXTOUCH) 100 UNIT/ML SOPN FlexTouch Pen Inject 0.05 mLs (5 Units total)  into the skin daily. Patient taking differently: Inject 18 Units into the skin daily. 08/10/19  Yes Hawks, Christy A, FNP  metoprolol succinate (TOPROL-XL) 25 MG 24 hr tablet Take 50 mg by mouth daily.   Yes [provider]  nitroGLYCERIN (NITROSTAT) 0.4 MG SL tablet Place 0.4 mg under the tongue every 5 (five) minutes as needed for chest pain.   Yes [provider]  OLANZapine (ZYPREXA) 10 MG tablet Take 10 mg by mouth at bedtime.   Yes [provider]  potassium chloride SA (KLOR-CON) 20 MEQ tablet Take 1 tablet (20 mEq total) by mouth daily. 12/20/19  Yes Hawks, Christy A, FNP  sulfamethoxazole-trimethoprim (BACTRIM DS) 800-160 MG tablet Take 1 tablet by mouth 2 (two) times daily. Starting 9.1.22 x 7 days. 08/13/21  Yes [provider]  tamsulosin (FLOMAX) 0.4 MG CAPS capsule Take 0.4 mg by mouth daily.   Yes [provider]  atorvastatin (LIPITOR) 40 MG tablet Take 1 tablet by mouth once daily Patient not taking: No sig reported 01/17/20   Evelina Dun A, FNP  blood glucose meter kit and supplies KIT Dispense based on patient and insurance preference. Use up to twice a daily as directed. (FOR ICD-10 - type 2 DM uncontrolled E11.9) 08/19/19   Johnson, Clanford L, MD  blood glucose meter kit and supplies Dispense based on patient and insurance preference. Use up to four times daily as directed. (FOR ICD-10 E10.9, E11.9). 08/23/19   Evelina Dun A, FNP  blood glucose meter kit and supplies Use up to four times daily as directed. dx E11.9 08/23/19   Terald Sleeper, PA-C  Blood Glucose Monitoring Suppl (ONE TOUCH ULTRA 2) w/Device KIT USE TO CHECK BLOOD SUGAR UP TO 4 TIMES DAILY AS DIRECTED 05/15/20   Evelina Dun A, FNP  glucose blood test strip Use as instructed 05/15/20   Evelina Dun A, FNP  Insulin Pen Needle (PEN NEEDLES) 32G X 4 MM MISC 100 each by Does not apply route 4 (four) times daily. E10.9 05/16/20   Sharion Balloon, FNP  Lancets (ONETOUCH DELICA PLUS  [id number]) MISC USE TO CHECK BLOOD SUGAR UP TO 4 TIMES A DAY AS DIRECTED 01/23/20   Evelina Dun A, FNP  losartan (COZAAR) 25 MG tablet Take 1 tablet (25 mg total) by mouth daily. Please schedule an appt for further refills Patient not taking: No sig reported 09/08/19   Minus Breeding, MD  OLANZapine (ZYPREXA) 2.5 MG tablet Take 1 tablet (2.5 mg total) by mouth at bedtime. Patient not taking: No sig reported 12/28/19   Evelina Dun A, FNP  sitaGLIPtin (JANUVIA) 100 MG tablet Take 1 tablet (100 mg total) by mouth daily. Patient not taking: No sig reported 08/26/19   Sharion Balloon, FNP    Physical Exam: BP 129/82   Pulse 67   Temp 98.1 F (36.7 C) (Oral)   Resp (!) 21   Ht _0  (1.702 m)   Wt 70.3 kg   SpO2 99%   BMI 24.28 kg/m   General: 75 y.o. year-old male well developed well nourished in no acute distress.  Alert and oriented x3. HEENT: NCAT, EOMI Neck: Supple, trachea medial Cardiovascular: Bradycardia.  Regular rate and rhythm with no rubs or gallops.  No  thyromegaly or JVD noted.  No lower extremity edema. 2/4 pulses in all 4 extremities. Respiratory: Clear to auscultation with no wheezes or rales.  Abdomen: Soft, nontender nondistended with normal bowel sounds x4 quadrants. Muskuloskeletal: No cyanosis, clubbing or edema noted bilaterally Neuro: CN II-XII intact, strength 5/5 x 4, sensation, reflexes intact Skin: No ulcerative lesions noted or rashes Psychiatry: Mood is appropriate for condition and setting          Labs on Admission:  Basic Metabolic Panel: Recent Labs  Lab 08/15/21 1614  NA 134*  K 4.2  CL 107  CO2 22  GLUCOSE 205*  BUN 24*  CREATININE 1.30*  CALCIUM 8.2*   Liver Function Tests: Recent Labs  Lab 08/15/21 1614  AST 23  ALT 17  ALKPHOS 93  BILITOT 0.4  PROT 7.1  ALBUMIN 3.3*   No results for input(s): LIPASE, AMYLASE in the last 168 hours. No results for input(s): AMMONIA in the last 168 hours. CBC: Recent Labs  Lab  08/15/21 1614  WBC 5.5  NEUTROABS 4.5  HGB 13.2  HCT 41.6  MCV 89.3  PLT 283   Cardiac Enzymes: No results for input(s): CKTOTAL, CKMB, CKMBINDEX, TROPONINI in the last 168 hours.  BNP (last 3 results) Recent Labs    08/15/21 1614  BNP 31.0    ProBNP (last 3 results) No results for input(s): PROBNP in the last 8760 hours.  CBG: No results for input(s): GLUCAP in the last 168 hours.  Radiological Exams on Admission: DG Chest 2 View  Result Date: 08/15/2021 CLINICAL DATA:  Chest pain EXAM: CHEST - 2 VIEW COMPARISON:  08/17/2019 FINDINGS: Normal heart size, mediastinal contours, and pulmonary vascularity. Atherosclerotic calcification aorta. Emphysematous and bronchitic changes consistent with COPD. RIGHT apical scarring. No acute infiltrate, pleural effusion, or pneumothorax. Bones demineralized with chronic superior endplate height loss of a lower thoracic vertebra. IMPRESSION: COPD changes with RIGHT apical scarring. No acute abnormalities. Electronically Signed   By: Lavonia Dana M.D.   On: 08/15/2021 18:50   CT Angio Chest PE W and/or Wo Contrast  Result Date: 08/15/2021 CLINICAL DATA:  Chest pain today, nitro given at urgent care without relief. Patient poor historian. Rule out pulmonary embolus. EXAM: CT ANGIOGRAPHY CHEST WITH CONTRAST TECHNIQUE: Multidetector CT imaging of the chest was performed using the standard protocol during bolus administration of intravenous contrast. Multiplanar CT image reconstructions and MIPs were obtained to evaluate the vascular anatomy. CONTRAST:  40m OMNIPAQUE IOHEXOL 350 MG/ML SOLN COMPARISON:  None. FINDINGS: Cardiovascular: Satisfactory opacification of the pulmonary arteries to the segmental level. Limited evaluation of the subsegmental levels due to motion artifact. No evidence of pulmonary embolism. Normal heart size. No significant pericardial effusion. The thoracic aorta is normal in caliber. Moderate atherosclerotic plaque of the thoracic  aorta. Four-vessel coronary artery calcifications. Mediastinum/Nodes: Borderline enlarged 1 cm right lymph node. No enlarged mediastinal, hilar, or axillary lymph nodes. Slightly patulous distal esophagus. Otherwise thyroid gland, trachea, and esophagus demonstrate no significant findings. Lungs/Pleura: Biapical pleural/pulmonary scarring. Bibasilar linear atelectasis slightly elevated left hemidiaphragm. No focal consolidation. Right upper lobe nodular like subsolid density measuring up to 5 mm (7:63, 6:55). No pulmonary mass. No pleural effusion. No pneumothorax. Upper Abdomen: Partially visualized fluid density lesion within the left kidney. No acute abnormality. Musculoskeletal: Bilateral trace gynecomastia.  No chest wall abnormality. No suspicious lytic or blastic osseous lesions. No acute displaced fracture. Review of the MIP images confirms the above findings. IMPRESSION: 1. No central or segmental pulmonary embolus. Limited  evaluation at the subsegmental level due to motion artifact. 2. A 5 mm right upper lobe subsolid nodular-like density. Non-contrast chest CT at 3-6 months is recommended. If nodules persist and are stable at that time, consider additional non-contrast chest CT examinations at 2 and 4 years. This recommendation follows the consensus statement: Guidelines for Management of Incidental Pulmonary Nodules Detected on CT Images: From the Fleischner Society 2017; Radiology 2017; 284:228-243. 3. Nonspecific borderline enlarged 1 cm right lymph node. 4.  Aortic Atherosclerosis (ICD10-I70.0). Electronically Signed   By: Iven Finn M.D.   On: 08/15/2021 19:22    EKG: I independently viewed the EKG done and my findings are as followed: Sinus Bradycardia at 57bpm   Assessment/Plan Present on Admission:  Chest pain  Hyperlipidemia associated with type 2 diabetes mellitus ([place])  Hypertension associated with diabetes ([place])  Principal Problem:   Chest pain Active Problems:    Hyperlipidemia associated with type 2 diabetes mellitus ([place])   Hypertension associated with diabetes ([place])   Right upper lobe pulmonary nodule   Hypoalbuminemia due to protein-calorie malnutrition ([place])   AKI (acute kidney injury) ([place])   Hyperglycemia due to diabetes mellitus ([place])   BPH (benign prostatic hyperplasia)   Chest pain rule out ACS Patient complained of tender and reproducible chest pain as well as pressure-like chest pain with radiation to left arm Patient states chest pain improves with rest except today Continue telemetry Troponin x 2 - 14 > 28, continue to trend troponin EKG personally reviewed shows sinus bradycardia Cardiology will be consulted to help decide if Stress test is needed in am Versus other diagnostic modalities.   Aspirin 324 mg p.o. x1 will be given Continue aspirin, nitroglycerin as needed  Hyperglycemia secondary to T2DM Continue ISS and hypoglycemic protocol  Acute kidney injury BUNs/creatinine 24/1.30 (baseline creatinine at 1.0) Continue gentle hydration Renally adjust medications, avoid nephrotoxic agents/dehydration/hypotension  Incidental finding of right upper lobe pulmonary nodule CT angiography of chest showed a 5 mm right upper lobe soft solid nodular-like density noted.  Non- Contrast chest CT 3 to 6 months recommended  Hypoalbuminemia secondary to mild protein calorie malnutrition Albumin 3.3, protein supplement to be provided  BPH Continue Flomax  Essential hypertension Continue Toprol and Norvasc  Hyperlipidemia Patient was no longer taking atorvastatin per med rec We shall await updated med rec   DVT prophylaxis: Lovenox, SCDs  Code Status: Full code  Family Communication: Wife at bedside (all questions answered to satisfaction)  Disposition Plan:  Patient is from:                        home Anticipated DC to:                   SNF or family members home Anticipated DC date:               2-3 days Anticipated  DC barriers:          Patient requires inpatient management due to chest pain requiring further work-up and pending cardiology consult  Consults called: Cardiology  Admission status: Observation    Bernadette Hoit MD [facility]  08/15/2021, 10:35 PM

## 2021-08-15 NOTE — ED Provider Notes (Signed)
[facility] EMERGENCY DEPARTMENT Provider Note   CSN: [id number] Arrival date & time: 08/15/21  1533     History Chief Complaint  Patient presents with   Chest Pain    Robert Osborne is a 75 y.o. male who presents to the emergency department for evaluation of chest pain.  Patient states that he had exertional chest pain that is pressure-like, 7 out of 10 with no radiation.  He states that he has been waking up in the middle the night covered in sweat and having to change his clothes.  He endorses associated shortness of breath with exertion but denies abdominal pain, nausea, vomiting, exertional diaphoresis, fever or other systemic symptoms.  Patient seen in urgent care and given aspirin and nitroglycerin.   Chest Pain Associated symptoms: diaphoresis and shortness of breath   Associated symptoms: no abdominal pain, no back pain, no cough, no fever, no palpitations and no vomiting       Past Medical History:  Diagnosis Date   Bladder cancer ([place]) 08/18/2013   Cancer ([place])    papillary urethral carcinoma,low grade, non-invasive   Diabetes mellitus without complication ([place])    Erectile dysfunction 10/24/2016   Hyperlipidemia associated with type 2 diabetes mellitus ([place]) 05/17/2013   Hypertension associated with diabetes ([place])    Hypokalemia 11/27/2017   Hypomagnesemia 11/27/2017   Major frontotemporal neurocognitive disorder, probable, with behavioral disturbance 11/27/2017   Vitamin D deficiency 05/17/2013    Patient Active Problem List   Diagnosis Date Noted   Chest pain 08/15/2021   Right upper lobe pulmonary nodule 08/15/2021   Hypoalbuminemia due to protein-calorie malnutrition ([place]) 08/15/2021   AKI (acute kidney injury) ([place]) 08/15/2021   Hyperglycemia due to diabetes mellitus ([place]) 08/15/2021   BPH (benign prostatic hyperplasia) 08/15/2021   Generalized weakness    Neurological deficit present    Facial droop 08/17/2019   Hx of bladder cancer 07/29/2019   Abnormal  stress test 08/11/2018   PVC's (premature ventricular contractions) 08/11/2018   Hypokalemia 11/27/2017   Hypomagnesemia 11/27/2017   Major frontotemporal neurocognitive disorder, probable, with behavioral disturbance 11/27/2017   Erectile dysfunction 10/24/2016   Bladder cancer ([place]) 08/18/2013   Diabetes mellitus ([place])    Hypertension associated with diabetes ([place])    Vitamin D deficiency 05/17/2013   Hyperlipidemia associated with type 2 diabetes mellitus ([place]) 05/17/2013    Past Surgical History:  Procedure Laterality Date   COLONOSCOPY W/ POLYPECTOMY         Family History  Problem Relation Age of Onset   Heart attack Maternal Grandmother    Stroke Maternal Grandfather    Heart attack Mother 42    Social History   Tobacco Use   Smoking status: Former    Types: Cigarettes    Quit date: 05/17/1973    Years since quitting: 48.2   Smokeless tobacco: Never  Vaping Use   Vaping Use: Never used  Substance Use Topics   Alcohol use: No   Drug use: No    Home Medications Prior to Admission medications   Medication Sig Start Date End Date Taking? Authorizing Provider  amLODipine (NORVASC) 10 MG tablet Take 1 tablet (10 mg total) by mouth daily. (Needs to be seen before next refill) 02/22/20  Yes Hawks, Christy A, FNP  aspirin 325 MG tablet Take 1 tablet (325 mg total) by mouth daily. 08/20/19  Yes Johnson, Clanford L, MD  insulin aspart (NOVOLOG) 100 UNIT/ML FlexPen Inject 9 Units into the skin 2 (two) times daily with  a meal. 01/10/20  Yes Hawks, Christy A, FNP  insulin degludec (TRESIBA FLEXTOUCH) 100 UNIT/ML SOPN FlexTouch Pen Inject 0.05 mLs (5 Units total) into the skin daily. Patient taking differently: Inject 18 Units into the skin daily. 08/10/19  Yes Hawks, Christy A, FNP  metoprolol succinate (TOPROL-XL) 25 MG 24 hr tablet Take 50 mg by mouth daily.   Yes [provider]  nitroGLYCERIN (NITROSTAT) 0.4 MG SL tablet Place 0.4 mg under the tongue every 5 (five)  minutes as needed for chest pain.   Yes [provider]  OLANZapine (ZYPREXA) 10 MG tablet Take 10 mg by mouth at bedtime.   Yes [provider]  potassium chloride SA (KLOR-CON) 20 MEQ tablet Take 1 tablet (20 mEq total) by mouth daily. 12/20/19  Yes Hawks, Christy A, FNP  sulfamethoxazole-trimethoprim (BACTRIM DS) 800-160 MG tablet Take 1 tablet by mouth 2 (two) times daily. Starting 9.1.22 x 7 days. 08/13/21  Yes [provider]  tamsulosin (FLOMAX) 0.4 MG CAPS capsule Take 0.4 mg by mouth daily.   Yes [provider]  atorvastatin (LIPITOR) 40 MG tablet Take 1 tablet by mouth once daily Patient not taking: No sig reported 01/17/20   Evelina Dun A, FNP  blood glucose meter kit and supplies KIT Dispense based on patient and insurance preference. Use up to twice a daily as directed. (FOR ICD-10 - type 2 DM uncontrolled E11.9) 08/19/19   Johnson, Clanford L, MD  blood glucose meter kit and supplies Dispense based on patient and insurance preference. Use up to four times daily as directed. (FOR ICD-10 E10.9, E11.9). 08/23/19   Evelina Dun A, FNP  blood glucose meter kit and supplies Use up to four times daily as directed. dx E11.9 08/23/19   Terald Sleeper, PA-C  Blood Glucose Monitoring Suppl (ONE TOUCH ULTRA 2) w/Device KIT USE TO CHECK BLOOD SUGAR UP TO 4 TIMES DAILY AS DIRECTED 05/15/20   Evelina Dun A, FNP  glucose blood test strip Use as instructed 05/15/20   Evelina Dun A, FNP  Insulin Pen Needle (PEN NEEDLES) 32G X 4 MM MISC 100 each by Does not apply route 4 (four) times daily. E10.9 05/16/20   Sharion Balloon, FNP  Lancets (ONETOUCH DELICA PLUS [id number]) MISC USE TO CHECK BLOOD SUGAR UP TO 4 TIMES A DAY AS DIRECTED 01/23/20   Evelina Dun A, FNP  losartan (COZAAR) 25 MG tablet Take 1 tablet (25 mg total) by mouth daily. Please schedule an appt for further refills Patient not taking: No sig reported 09/08/19   Minus Breeding, MD  OLANZapine (ZYPREXA) 2.5 MG  tablet Take 1 tablet (2.5 mg total) by mouth at bedtime. Patient not taking: No sig reported 12/28/19   Evelina Dun A, FNP  sitaGLIPtin (JANUVIA) 100 MG tablet Take 1 tablet (100 mg total) by mouth daily. Patient not taking: No sig reported 08/26/19   Evelina Dun A, FNP    Allergies    Enalapril  Review of Systems   Review of Systems  Constitutional:  Positive for diaphoresis. Negative for chills and fever.  HENT:  Negative for ear pain and sore throat.   Eyes:  Negative for pain and visual disturbance.  Respiratory:  Positive for shortness of breath. Negative for cough.   Cardiovascular:  Positive for chest pain. Negative for palpitations.  Gastrointestinal:  Negative for abdominal pain and vomiting.  Genitourinary:  Negative for dysuria and hematuria.  Musculoskeletal:  Negative for arthralgias and back pain.  Skin:  Negative  for color change and rash.  Neurological:  Negative for seizures and syncope.  All other systems reviewed and are negative.  Physical Exam Updated Vital Signs BP (!) 151/83   Pulse 63   Temp 98.1 F (36.7 C) (Oral)   Resp (!) 22   Ht $R'5\' 7"'QM$  (1.702 m)   Wt 70.3 kg   SpO2 98%   BMI 24.28 kg/m   Physical Exam Vitals and nursing note reviewed.  Constitutional:      Appearance: He is well-developed.  HENT:     Head: Normocephalic and atraumatic.  Eyes:     Conjunctiva/sclera: Conjunctivae normal.  Cardiovascular:     Rate and Rhythm: Normal rate and regular rhythm.     Heart sounds: No murmur heard. Pulmonary:     Effort: Pulmonary effort is normal. No respiratory distress.     Breath sounds: Normal breath sounds.  Abdominal:     Palpations: Abdomen is soft.     Tenderness: There is no abdominal tenderness.  Musculoskeletal:     Cervical back: Neck supple.  Skin:    General: Skin is warm and dry.  Neurological:     Mental Status: He is alert.    ED Results / Procedures / Treatments   Labs (all labs ordered are listed, but only  abnormal results are displayed) Labs Reviewed  COMPREHENSIVE METABOLIC PANEL - Abnormal; Notable for the following components:      Result Value   Sodium 134 (*)    Glucose, Bld 205 (*)    BUN 24 (*)    Creatinine, Ser 1.30 (*)    Calcium 8.2 (*)    Albumin 3.3 (*)    GFR, Estimated 57 (*)    All other components within normal limits  CBC WITH DIFFERENTIAL/PLATELET - Abnormal; Notable for the following components:   Lymphs Abs 0.6 (*)    All other components within normal limits  CBG MONITORING, ED - Abnormal; Notable for the following components:   Glucose-Capillary 202 (*)    All other components within normal limits  TROPONIN I (HIGH SENSITIVITY) - Abnormal; Notable for the following components:   Troponin I (High Sensitivity) 28 (*)    All other components within normal limits  TROPONIN I (HIGH SENSITIVITY) - Abnormal; Notable for the following components:   Troponin I (High Sensitivity) 108 (*)    All other components within normal limits  BRAIN NATRIURETIC PEPTIDE  HEMOGLOBIN A1C  TROPONIN I (HIGH SENSITIVITY)    EKG None  Radiology DG Chest 2 View  Result Date: 08/15/2021 CLINICAL DATA:  Chest pain EXAM: CHEST - 2 VIEW COMPARISON:  08/17/2019 FINDINGS: Normal heart size, mediastinal contours, and pulmonary vascularity. Atherosclerotic calcification aorta. Emphysematous and bronchitic changes consistent with COPD. RIGHT apical scarring. No acute infiltrate, pleural effusion, or pneumothorax. Bones demineralized with chronic superior endplate height loss of a lower thoracic vertebra. IMPRESSION: COPD changes with RIGHT apical scarring. No acute abnormalities. Electronically Signed   By: Lavonia Dana M.D.   On: 08/15/2021 18:50   CT Angio Chest PE W and/or Wo Contrast  Result Date: 08/15/2021 CLINICAL DATA:  Chest pain today, nitro given at urgent care without relief. Patient poor historian. Rule out pulmonary embolus. EXAM: CT ANGIOGRAPHY CHEST WITH CONTRAST TECHNIQUE:  Multidetector CT imaging of the chest was performed using the standard protocol during bolus administration of intravenous contrast. Multiplanar CT image reconstructions and MIPs were obtained to evaluate the vascular anatomy. CONTRAST:  47mL OMNIPAQUE IOHEXOL 350 MG/ML SOLN COMPARISON:  None.  FINDINGS: Cardiovascular: Satisfactory opacification of the pulmonary arteries to the segmental level. Limited evaluation of the subsegmental levels due to motion artifact. No evidence of pulmonary embolism. Normal heart size. No significant pericardial effusion. The thoracic aorta is normal in caliber. Moderate atherosclerotic plaque of the thoracic aorta. Four-vessel coronary artery calcifications. Mediastinum/Nodes: Borderline enlarged 1 cm right lymph node. No enlarged mediastinal, hilar, or axillary lymph nodes. Slightly patulous distal esophagus. Otherwise thyroid gland, trachea, and esophagus demonstrate no significant findings. Lungs/Pleura: Biapical pleural/pulmonary scarring. Bibasilar linear atelectasis slightly elevated left hemidiaphragm. No focal consolidation. Right upper lobe nodular like subsolid density measuring up to 5 mm (7:63, 6:55). No pulmonary mass. No pleural effusion. No pneumothorax. Upper Abdomen: Partially visualized fluid density lesion within the left kidney. No acute abnormality. Musculoskeletal: Bilateral trace gynecomastia.  No chest wall abnormality. No suspicious lytic or blastic osseous lesions. No acute displaced fracture. Review of the MIP images confirms the above findings. IMPRESSION: 1. No central or segmental pulmonary embolus. Limited evaluation at the subsegmental level due to motion artifact. 2. A 5 mm right upper lobe subsolid nodular-like density. Non-contrast chest CT at 3-6 months is recommended. If nodules persist and are stable at that time, consider additional non-contrast chest CT examinations at 2 and 4 years. This recommendation follows the consensus statement:  Guidelines for Management of Incidental Pulmonary Nodules Detected on CT Images: From the Fleischner Society 2017; Radiology 2017; 284:228-243. 3. Nonspecific borderline enlarged 1 cm right lymph node. 4.  Aortic Atherosclerosis (ICD10-I70.0). Electronically Signed   By: Iven Finn M.D.   On: 08/15/2021 19:22    Procedures Procedures   Medications Ordered in ED Medications  acetaminophen (TYLENOL) tablet 650 mg (has no administration in time range)  aspirin EC tablet 81 mg (has no administration in time range)  nitroGLYCERIN (NITROSTAT) SL tablet 0.4 mg (has no administration in time range)  insulin aspart (novoLOG) injection 0-9 Units (has no administration in time range)  insulin aspart (novoLOG) injection 0-5 Units (2 Units Subcutaneous Given 08/15/21 2331)  feeding supplement (GLUCERNA SHAKE) (GLUCERNA SHAKE) liquid 237 mL (has no administration in time range)  iohexol (OMNIPAQUE) 350 MG/ML injection 75 mL (75 mLs Intravenous Contrast Given 08/15/21 1808)  lactated ringers bolus 500 mL (0 mLs Intravenous Stopped 08/15/21 2202)  aspirin chewable tablet 324 mg (324 mg Oral Given 08/15/21 2320)  0.9 %  sodium chloride infusion ( Intravenous New Bag/Given 08/15/21 2330)    ED Course  I have reviewed the triage vital signs and the nursing notes.  Pertinent labs & imaging results that were available during my care of the patient were reviewed by me and considered in my medical decision making (see chart for details).    MDM Rules/Calculators/A&P                           Patient seen the emergency department for evaluation of chest pain.  Physical exam is unremarkable.  Laboratory evaluation reveals a creatinine elevation of 1.3, CBC unremarkable, initial troponin 28, delta troponin 108.  ECG nonischemic.  Patient admitted for high risk chest pain. Final Clinical Impression(s) / ED Diagnoses Final diagnoses:  None    Rx / DC Orders ED Discharge Orders     None        Nevena Rozenberg,  Debe Coder, MD 08/16/21 [phone]

## 2021-08-15 NOTE — ED Triage Notes (Signed)
Pt has CP, given 324 ASA, '6mg'$  Morphine and 3 nitros at urgent care.  Pt alert and oriented, skin warm and dry, 7/10, pt has neem stressed over family

## 2021-08-16 ENCOUNTER — Other Ambulatory Visit: Payer: Self-pay

## 2021-08-16 ENCOUNTER — Observation Stay (HOSPITAL_BASED_OUTPATIENT_CLINIC_OR_DEPARTMENT_OTHER): Payer: Medicare HMO

## 2021-08-16 ENCOUNTER — Inpatient Hospital Stay (HOSPITAL_COMMUNITY)
Admission: EM | Disposition: A | Discharge status: Home Health | Payer: Self-pay | Source: Home / Self Care | Attending: Thoracic Surgery (Cardiothoracic Vascular Surgery)

## 2021-08-16 DIAGNOSIS — E8770 Fluid overload, unspecified: Secondary | ICD-10-CM | POA: Diagnosis not present

## 2021-08-16 DIAGNOSIS — Z4682 Encounter for fitting and adjustment of non-vascular catheter: Secondary | ICD-10-CM | POA: Diagnosis not present

## 2021-08-16 DIAGNOSIS — I25110 Atherosclerotic heart disease of native coronary artery with unstable angina pectoris: Secondary | ICD-10-CM | POA: Diagnosis not present

## 2021-08-16 DIAGNOSIS — J9811 Atelectasis: Secondary | ICD-10-CM | POA: Diagnosis not present

## 2021-08-16 DIAGNOSIS — R531 Weakness: Secondary | ICD-10-CM | POA: Diagnosis not present

## 2021-08-16 DIAGNOSIS — I080 Rheumatic disorders of both mitral and aortic valves: Secondary | ICD-10-CM | POA: Diagnosis not present

## 2021-08-16 DIAGNOSIS — D62 Acute posthemorrhagic anemia: Secondary | ICD-10-CM | POA: Diagnosis not present

## 2021-08-16 DIAGNOSIS — D72829 Elevated white blood cell count, unspecified: Secondary | ICD-10-CM | POA: Diagnosis not present

## 2021-08-16 DIAGNOSIS — Z452 Encounter for adjustment and management of vascular access device: Secondary | ICD-10-CM | POA: Diagnosis not present

## 2021-08-16 DIAGNOSIS — G4440 Drug-induced headache, not elsewhere classified, not intractable: Secondary | ICD-10-CM | POA: Diagnosis not present

## 2021-08-16 DIAGNOSIS — R079 Chest pain, unspecified: Secondary | ICD-10-CM | POA: Diagnosis not present

## 2021-08-16 DIAGNOSIS — I248 Other forms of acute ischemic heart disease: Secondary | ICD-10-CM | POA: Diagnosis not present

## 2021-08-16 DIAGNOSIS — E785 Hyperlipidemia, unspecified: Secondary | ICD-10-CM | POA: Diagnosis not present

## 2021-08-16 DIAGNOSIS — I2510 Atherosclerotic heart disease of native coronary artery without angina pectoris: Secondary | ICD-10-CM

## 2021-08-16 DIAGNOSIS — N179 Acute kidney failure, unspecified: Secondary | ICD-10-CM | POA: Diagnosis not present

## 2021-08-16 DIAGNOSIS — R0602 Shortness of breath: Secondary | ICD-10-CM | POA: Diagnosis not present

## 2021-08-16 DIAGNOSIS — I214 Non-ST elevation (NSTEMI) myocardial infarction: Secondary | ICD-10-CM | POA: Diagnosis not present

## 2021-08-16 DIAGNOSIS — I2109 ST elevation (STEMI) myocardial infarction involving other coronary artery of anterior wall: Secondary | ICD-10-CM | POA: Diagnosis present

## 2021-08-16 DIAGNOSIS — I493 Ventricular premature depolarization: Secondary | ICD-10-CM | POA: Diagnosis not present

## 2021-08-16 DIAGNOSIS — J449 Chronic obstructive pulmonary disease, unspecified: Secondary | ICD-10-CM | POA: Diagnosis not present

## 2021-08-16 DIAGNOSIS — Z01810 Encounter for preprocedural cardiovascular examination: Secondary | ICD-10-CM | POA: Diagnosis not present

## 2021-08-16 DIAGNOSIS — S2231XA Fracture of one rib, right side, initial encounter for closed fracture: Secondary | ICD-10-CM | POA: Diagnosis not present

## 2021-08-16 DIAGNOSIS — K567 Ileus, unspecified: Secondary | ICD-10-CM | POA: Diagnosis not present

## 2021-08-16 DIAGNOSIS — E7849 Other hyperlipidemia: Secondary | ICD-10-CM | POA: Diagnosis not present

## 2021-08-16 DIAGNOSIS — E441 Mild protein-calorie malnutrition: Secondary | ICD-10-CM | POA: Diagnosis not present

## 2021-08-16 DIAGNOSIS — J90 Pleural effusion, not elsewhere classified: Secondary | ICD-10-CM | POA: Diagnosis not present

## 2021-08-16 DIAGNOSIS — R778 Other specified abnormalities of plasma proteins: Secondary | ICD-10-CM | POA: Diagnosis not present

## 2021-08-16 DIAGNOSIS — I2584 Coronary atherosclerosis due to calcified coronary lesion: Secondary | ICD-10-CM | POA: Diagnosis present

## 2021-08-16 DIAGNOSIS — R1310 Dysphagia, unspecified: Secondary | ICD-10-CM | POA: Diagnosis not present

## 2021-08-16 DIAGNOSIS — I152 Hypertension secondary to endocrine disorders: Secondary | ICD-10-CM | POA: Diagnosis not present

## 2021-08-16 DIAGNOSIS — Z20822 Contact with and (suspected) exposure to covid-19: Secondary | ICD-10-CM | POA: Diagnosis not present

## 2021-08-16 DIAGNOSIS — K429 Umbilical hernia without obstruction or gangrene: Secondary | ICD-10-CM | POA: Diagnosis present

## 2021-08-16 DIAGNOSIS — I517 Cardiomegaly: Secondary | ICD-10-CM | POA: Diagnosis not present

## 2021-08-16 DIAGNOSIS — E11649 Type 2 diabetes mellitus with hypoglycemia without coma: Secondary | ICD-10-CM | POA: Diagnosis not present

## 2021-08-16 DIAGNOSIS — Z01818 Encounter for other preprocedural examination: Secondary | ICD-10-CM | POA: Diagnosis not present

## 2021-08-16 DIAGNOSIS — Z794 Long term (current) use of insulin: Secondary | ICD-10-CM | POA: Diagnosis not present

## 2021-08-16 DIAGNOSIS — I10 Essential (primary) hypertension: Secondary | ICD-10-CM

## 2021-08-16 DIAGNOSIS — E8809 Other disorders of plasma-protein metabolism, not elsewhere classified: Secondary | ICD-10-CM | POA: Diagnosis not present

## 2021-08-16 DIAGNOSIS — J811 Chronic pulmonary edema: Secondary | ICD-10-CM | POA: Diagnosis not present

## 2021-08-16 DIAGNOSIS — I700 Atherosclerosis of aorta: Secondary | ICD-10-CM | POA: Diagnosis not present

## 2021-08-16 DIAGNOSIS — Z951 Presence of aortocoronary bypass graft: Secondary | ICD-10-CM | POA: Diagnosis not present

## 2021-08-16 DIAGNOSIS — E1165 Type 2 diabetes mellitus with hyperglycemia: Secondary | ICD-10-CM | POA: Diagnosis not present

## 2021-08-16 DIAGNOSIS — E1169 Type 2 diabetes mellitus with other specified complication: Secondary | ICD-10-CM | POA: Diagnosis not present

## 2021-08-16 DIAGNOSIS — N400 Enlarged prostate without lower urinary tract symptoms: Secondary | ICD-10-CM | POA: Diagnosis present

## 2021-08-16 DIAGNOSIS — E1159 Type 2 diabetes mellitus with other circulatory complications: Secondary | ICD-10-CM | POA: Diagnosis not present

## 2021-08-16 HISTORY — PX: CORONARY BALLOON ANGIOPLASTY: CATH118233

## 2021-08-16 HISTORY — PX: LEFT HEART CATH AND CORONARY ANGIOGRAPHY: CATH118249

## 2021-08-16 LAB — GLUCOSE, CAPILLARY
Glucose-Capillary: 99 mg/dL (ref 70–99)
Glucose-Capillary: 87 mg/dL (ref 70–99)
Glucose-Capillary: 79 mg/dL (ref 70–99)
Glucose-Capillary: 109 mg/dL — ABNORMAL HIGH (ref 70–99)
Glucose-Capillary: 253 mg/dL — ABNORMAL HIGH (ref 70–99)

## 2021-08-16 LAB — RESP PANEL BY RT-PCR (FLU A&B, COVID) ARPGX2
Influenza A by PCR: NEGATIVE
Influenza B by PCR: NEGATIVE
SARS Coronavirus 2 by RT PCR: NEGATIVE

## 2021-08-16 LAB — ECHOCARDIOGRAM COMPLETE
AR max vel: 3.21 cm2
AV Area VTI: 3.16 cm2
AV Area mean vel: 3.3 cm2
AV Mean grad: 2 mmHg
AV Peak grad: 4.8 mmHg
Ao pk vel: 1.09 m/s
Area-P 1/2: 3.06 cm2
Height: 67 in
MV VTI: 2.97 cm2
S' Lateral: 2.46 cm
Weight: 2384.5 oz

## 2021-08-16 LAB — TROPONIN I (HIGH SENSITIVITY)
Troponin I (High Sensitivity): 108 ng/L (ref <18)
Troponin I (High Sensitivity): 232 ng/L (ref <18)
Troponin I (High Sensitivity): 769 ng/L (ref <18)
Troponin I (High Sensitivity): 4567 ng/L (ref <18)

## 2021-08-16 LAB — CBC
HCT: 40.7 % (ref 39.0–52.0)
Hemoglobin: 13.2 g/dL (ref 13.0–17.0)
MCH: 28.2 pg (ref 26.0–34.0)
MCHC: 32.4 g/dL (ref 30.0–36.0)
MCV: 87 fL (ref 80.0–100.0)
Platelets: 282 10*3/uL (ref 150–400)
RBC: 4.68 MIL/uL (ref 4.22–5.81)
RDW: 14.1 % (ref 11.5–15.5)
WBC: 5.3 10*3/uL (ref 4.0–10.5)
nRBC: 0 % (ref 0.0–0.2)

## 2021-08-16 LAB — COMPREHENSIVE METABOLIC PANEL
ALT: 20 U/L (ref 0–44)
AST: 64 U/L — ABNORMAL HIGH (ref 15–41)
Albumin: 3 g/dL — ABNORMAL LOW (ref 3.5–5.0)
Alkaline Phosphatase: 83 U/L (ref 38–126)
Anion gap: 8 (ref 5–15)
BUN: 20 mg/dL (ref 8–23)
CO2: 24 mmol/L (ref 22–32)
Calcium: 8.4 mg/dL — ABNORMAL LOW (ref 8.9–10.3)
Chloride: 107 mmol/L (ref 98–111)
Creatinine, Ser: 1.16 mg/dL (ref 0.61–1.24)
GFR, Estimated: >60 mL/min (ref >60)
Glucose, Bld: 104 mg/dL — ABNORMAL HIGH (ref 70–99)
Potassium: 3.7 mmol/L (ref 3.5–5.1)
Sodium: 139 mmol/L (ref 135–145)
Total Bilirubin: 0.5 mg/dL (ref 0.3–1.2)
Total Protein: 6.4 g/dL — ABNORMAL LOW (ref 6.5–8.1)

## 2021-08-16 LAB — PROTIME-INR
INR: 1.1 (ref 0.8–1.2)
INR: 1.1 (ref 0.8–1.2)
Prothrombin Time: 14.5 seconds (ref 11.4–15.2)
Prothrombin Time: 13.8 seconds (ref 11.4–15.2)

## 2021-08-16 LAB — APTT
aPTT: 186 seconds (ref 24–36)
aPTT: 46 seconds — ABNORMAL HIGH (ref 24–36)

## 2021-08-16 LAB — HEMOGLOBIN A1C
Hgb A1c MFr Bld: 8 % — ABNORMAL HIGH (ref 4.8–5.6)
Mean Plasma Glucose: 182.9 mg/dL

## 2021-08-16 LAB — PHOSPHORUS: Phosphorus: 3.3 mg/dL (ref 2.5–4.6)

## 2021-08-16 LAB — LIPID PANEL
Cholesterol: 180 mg/dL (ref 0–200)
HDL: 34 mg/dL — ABNORMAL LOW (ref >40)
LDL Cholesterol: 128 mg/dL — ABNORMAL HIGH (ref 0–99)
Total CHOL/HDL Ratio: 5.3 RATIO
Triglycerides: 89 mg/dL (ref <150)
VLDL: 18 mg/dL (ref 0–40)

## 2021-08-16 LAB — POCT ACTIVATED CLOTTING TIME: Activated Clotting Time: 323 seconds

## 2021-08-16 LAB — MAGNESIUM: Magnesium: 2 mg/dL (ref 1.7–2.4)

## 2021-08-16 LAB — MRSA NEXT GEN BY PCR, NASAL: MRSA by PCR Next Gen: NOT DETECTED

## 2021-08-16 LAB — HEPARIN LEVEL (UNFRACTIONATED): Heparin Unfractionated: 0.14 IU/mL — ABNORMAL LOW (ref 0.30–0.70)

## 2021-08-16 SURGERY — LEFT HEART CATH AND CORONARY ANGIOGRAPHY
Anesthesia: LOCAL

## 2021-08-16 MED ORDER — IOHEXOL 350 MG/ML SOLN
INTRAVENOUS | Status: DC | PRN
  Administered 2021-08-16: 110 mL via INTRA_ARTERIAL

## 2021-08-16 MED ORDER — HEPARIN BOLUS VIA INFUSION
2000.0000 [IU] | Freq: Once | INTRAVENOUS | Status: AC
  Administered 2021-08-16: 2000 [IU] via INTRAVENOUS
  Filled 2021-08-16: qty 2000

## 2021-08-16 MED ORDER — SODIUM CHLORIDE 0.9 % IV SOLN: 250.0000 mL | INTRAVENOUS | Status: DC | PRN

## 2021-08-16 MED ORDER — LIDOCAINE HCL (PF) 1 % IJ SOLN
INTRAMUSCULAR | Status: AC
  Filled 2021-08-16: qty 30

## 2021-08-16 MED ORDER — HYDRALAZINE HCL 20 MG/ML IJ SOLN: 10.0000 mg | INTRAMUSCULAR | Status: AC | PRN

## 2021-08-16 MED ORDER — HEPARIN (PORCINE) IN NACL 1000-0.9 UT/500ML-% IV SOLN
INTRAVENOUS | Status: DC | PRN
  Administered 2021-08-16 (×2): 500 mL

## 2021-08-16 MED ORDER — METOPROLOL SUCCINATE ER 50 MG PO TB24
50.0000 mg | ORAL_TABLET | Freq: Every day | ORAL | Status: DC
  Administered 2021-08-16 – 2021-08-21 (×6): 50 mg via ORAL
  Filled 2021-08-16 (×6): qty 1

## 2021-08-16 MED ORDER — NITROGLYCERIN IN D5W 200-5 MCG/ML-% IV SOLN
0.0000 ug/min | INTRAVENOUS | Status: DC
  Administered 2021-08-16 (×2): 5 ug/min via INTRAVENOUS
  Administered 2021-08-17 – 2021-08-20 (×3): 25 ug/min via INTRAVENOUS
  Administered 2021-08-21: 35 ug/min via INTRAVENOUS
  Filled 2021-08-16 (×5): qty 250

## 2021-08-16 MED ORDER — ROSUVASTATIN CALCIUM 20 MG PO TABS
40.0000 mg | ORAL_TABLET | Freq: Every day | ORAL | Status: DC
  Administered 2021-08-16 – 2021-08-30 (×13): 40 mg via ORAL
  Filled 2021-08-16 (×13): qty 2

## 2021-08-16 MED ORDER — SODIUM CHLORIDE 0.9% FLUSH
3.0000 mL | Freq: Two times a day (BID) | INTRAVENOUS | Status: DC
  Administered 2021-08-16 – 2021-08-21 (×9): 3 mL via INTRAVENOUS

## 2021-08-16 MED ORDER — TIROFIBAN (AGGRASTAT) BOLUS VIA INFUSION
INTRAVENOUS | Status: DC | PRN
  Administered 2021-08-16: 1690 ug via INTRAVENOUS

## 2021-08-16 MED ORDER — FENTANYL CITRATE (PF) 100 MCG/2ML IJ SOLN
25.0000 ug | INTRAMUSCULAR | Status: DC | PRN
  Administered 2021-08-20: 25 ug via INTRAVENOUS
  Filled 2021-08-16: qty 2

## 2021-08-16 MED ORDER — VERAPAMIL HCL 2.5 MG/ML IV SOLN
INTRAVENOUS | Status: DC | PRN
  Administered 2021-08-16: 10 mL via INTRA_ARTERIAL

## 2021-08-16 MED ORDER — TIROFIBAN HCL IN NACL 5-0.9 MG/100ML-% IV SOLN
INTRAVENOUS | Status: AC | PRN
  Administered 2021-08-16: 0.075 ug/kg/min via INTRAVENOUS

## 2021-08-16 MED ORDER — LIDOCAINE HCL (PF) 1 % IJ SOLN
INTRAMUSCULAR | Status: DC | PRN
  Administered 2021-08-16: 2 mL

## 2021-08-16 MED ORDER — MIDAZOLAM HCL 2 MG/2ML IJ SOLN
INTRAMUSCULAR | Status: AC
  Filled 2021-08-16: qty 2

## 2021-08-16 MED ORDER — HEPARIN BOLUS VIA INFUSION
3000.0000 [IU] | Freq: Once | INTRAVENOUS | Status: AC
  Administered 2021-08-16: 3000 [IU] via INTRAVENOUS

## 2021-08-16 MED ORDER — FENTANYL CITRATE PF 50 MCG/ML IJ SOSY
50.0000 ug | PREFILLED_SYRINGE | INTRAMUSCULAR | Status: DC | PRN
  Administered 2021-08-16: 50 ug via INTRAVENOUS
  Filled 2021-08-16: qty 1

## 2021-08-16 MED ORDER — LORAZEPAM 2 MG/ML IJ SOLN
0.5000 mg | Freq: Once | INTRAMUSCULAR | Status: AC
  Administered 2021-08-16: 0.5 mg via INTRAVENOUS
  Filled 2021-08-16: qty 1

## 2021-08-16 MED ORDER — ENOXAPARIN SODIUM 40 MG/0.4ML IJ SOSY: 40.0000 mg | PREFILLED_SYRINGE | INTRAMUSCULAR | Status: DC

## 2021-08-16 MED ORDER — MIDAZOLAM HCL 2 MG/2ML IJ SOLN
INTRAMUSCULAR | Status: DC | PRN
  Administered 2021-08-16: 1 mg via INTRAVENOUS

## 2021-08-16 MED ORDER — VERAPAMIL HCL 2.5 MG/ML IV SOLN
INTRAVENOUS | Status: AC
  Filled 2021-08-16: qty 2

## 2021-08-16 MED ORDER — FENTANYL CITRATE PF 50 MCG/ML IJ SOSY: 25.0000 ug | PREFILLED_SYRINGE | INTRAMUSCULAR | Status: DC | PRN

## 2021-08-16 MED ORDER — SODIUM CHLORIDE 0.9% FLUSH: 3.0000 mL | INTRAVENOUS | Status: DC | PRN

## 2021-08-16 MED ORDER — AMLODIPINE BESYLATE 5 MG PO TABS: 10.0000 mg | ORAL_TABLET | Freq: Every day | ORAL | Status: DC

## 2021-08-16 MED ORDER — SODIUM CHLORIDE 0.9 % IV SOLN: INTRAVENOUS | Status: AC

## 2021-08-16 MED ORDER — SODIUM CHLORIDE 0.9 % WEIGHT BASED INFUSION
1.0000 mL/kg/h | INTRAVENOUS | Status: DC
Start: 2021-08-16 — End: 2021-08-22
  Administered 2021-08-16: 1 mL/kg/h via INTRAVENOUS

## 2021-08-16 MED ORDER — TIROFIBAN HCL IN NACL 5-0.9 MG/100ML-% IV SOLN
INTRAVENOUS | Status: AC
  Filled 2021-08-16: qty 100

## 2021-08-16 MED ORDER — HEPARIN (PORCINE) 25000 UT/250ML-% IV SOLN
1400.0000 [IU]/h | INTRAVENOUS | Status: DC
  Administered 2021-08-16: 1000 [IU]/h via INTRAVENOUS
  Administered 2021-08-18 – 2021-08-21 (×5): 1400 [IU]/h via INTRAVENOUS
  Filled 2021-08-16 (×7): qty 250

## 2021-08-16 MED ORDER — SODIUM CHLORIDE 0.9 % WEIGHT BASED INFUSION
3.0000 mL/kg/h | INTRAVENOUS | Status: AC
  Administered 2021-08-16: 3 mL/kg/h via INTRAVENOUS

## 2021-08-16 MED ORDER — HEPARIN SODIUM (PORCINE) 1000 UNIT/ML IJ SOLN
INTRAMUSCULAR | Status: DC | PRN
  Administered 2021-08-16: 5000 [IU] via INTRAVENOUS
  Administered 2021-08-16: 3500 [IU] via INTRAVENOUS

## 2021-08-16 MED ORDER — ASPIRIN 81 MG PO CHEW: 81.0000 mg | CHEWABLE_TABLET | Freq: Every day | ORAL | Status: DC

## 2021-08-16 MED ORDER — CHLORHEXIDINE GLUCONATE CLOTH 2 % EX PADS
6.0000 | MEDICATED_PAD | Freq: Every day | CUTANEOUS | Status: DC
  Administered 2021-08-16 – 2021-08-21 (×5): 6 via TOPICAL

## 2021-08-16 MED ORDER — ONDANSETRON HCL 4 MG/2ML IJ SOLN: 4.0000 mg | Freq: Four times a day (QID) | INTRAMUSCULAR | Status: DC | PRN

## 2021-08-16 MED ORDER — LABETALOL HCL 5 MG/ML IV SOLN: 10.0000 mg | INTRAVENOUS | Status: AC | PRN

## 2021-08-16 MED ORDER — HEPARIN SODIUM (PORCINE) 1000 UNIT/ML IJ SOLN
INTRAMUSCULAR | Status: AC
  Filled 2021-08-16: qty 1

## 2021-08-16 MED ORDER — ACETAMINOPHEN 325 MG PO TABS
650.0000 mg | ORAL_TABLET | ORAL | Status: DC | PRN
  Administered 2021-08-16 – 2021-08-21 (×8): 650 mg via ORAL
  Filled 2021-08-16 (×9): qty 2

## 2021-08-16 MED ORDER — TAMSULOSIN HCL 0.4 MG PO CAPS
0.4000 mg | ORAL_CAPSULE | Freq: Every day | ORAL | Status: DC
  Administered 2021-08-16 – 2021-08-30 (×13): 0.4 mg via ORAL
  Filled 2021-08-16 (×13): qty 1

## 2021-08-16 MED ORDER — FENTANYL CITRATE (PF) 100 MCG/2ML IJ SOLN
INTRAMUSCULAR | Status: DC | PRN
  Administered 2021-08-16: 25 ug via INTRAVENOUS
  Administered 2021-08-16: 50 ug via INTRAVENOUS

## 2021-08-16 MED ORDER — HEPARIN (PORCINE) IN NACL 1000-0.9 UT/500ML-% IV SOLN
INTRAVENOUS | Status: AC
  Filled 2021-08-16: qty 1000

## 2021-08-16 MED ORDER — ONDANSETRON HCL 4 MG/2ML IJ SOLN
4.0000 mg | Freq: Four times a day (QID) | INTRAMUSCULAR | Status: DC | PRN
Start: 2021-08-16 — End: 2021-08-16
  Administered 2021-08-16: 4 mg via INTRAVENOUS
  Filled 2021-08-16: qty 2

## 2021-08-16 MED ORDER — NITROGLYCERIN 1 MG/10 ML FOR IR/CATH LAB
INTRA_ARTERIAL | Status: AC
  Filled 2021-08-16: qty 10

## 2021-08-16 MED ORDER — TIROFIBAN HCL IN NACL 5-0.9 MG/100ML-% IV SOLN: 0.1500 ug/kg/min | INTRAVENOUS | Status: DC

## 2021-08-16 MED ORDER — HEPARIN (PORCINE) 25000 UT/250ML-% IV SOLN
1000.0000 [IU]/h | INTRAVENOUS | Status: DC
  Administered 2021-08-16: 800 [IU]/h via INTRAVENOUS
  Filled 2021-08-16: qty 250

## 2021-08-16 MED ORDER — OLANZAPINE 10 MG PO TABS
10.0000 mg | ORAL_TABLET | Freq: Every day | ORAL | Status: DC
  Administered 2021-08-16 – 2021-08-22 (×7): 10 mg via ORAL
  Filled 2021-08-16 (×8): qty 1

## 2021-08-16 MED ORDER — FENTANYL CITRATE (PF) 100 MCG/2ML IJ SOLN
INTRAMUSCULAR | Status: AC
  Filled 2021-08-16: qty 2

## 2021-08-16 MED ORDER — ORAL CARE MOUTH RINSE
15.0000 mL | Freq: Two times a day (BID) | OROMUCOSAL | Status: DC
  Administered 2021-08-16 – 2021-08-20 (×7): 15 mL via OROMUCOSAL

## 2021-08-16 MED ORDER — TIROFIBAN HCL IV 12.5 MG/250 ML
0.0750 ug/kg/min | INTRAVENOUS | Status: AC
  Administered 2021-08-16: 0.075 ug/kg/min via INTRAVENOUS
  Filled 2021-08-16: qty 250

## 2021-08-16 MED ORDER — SODIUM CHLORIDE 0.9% FLUSH
3.0000 mL | Freq: Two times a day (BID) | INTRAVENOUS | Status: DC
  Administered 2021-08-16 – 2021-08-21 (×5): 3 mL via INTRAVENOUS

## 2021-08-16 SURGICAL SUPPLY — 21 items
BALLN SAPPHIRE 1.0X10 (BALLOONS) ×2
BALLN SAPPHIRE 2.0X15 (BALLOONS) ×2
BALLOON SAPPHIRE 1.0X10 (BALLOONS) ×1 IMPLANT
BALLOON SAPPHIRE 2.0X15 (BALLOONS) ×1 IMPLANT
CATH 5FR JL3.5 JR4 ANG PIG MP (CATHETERS) ×2 IMPLANT
CATH INFINITI 5 FR AR1 MOD (CATHETERS) ×2 IMPLANT
CATH INFINITI 5FR AL1 (CATHETERS) ×2 IMPLANT
CATH LAUNCHER 6FR EBU3.5 (CATHETERS) ×2 IMPLANT
DEVICE RAD TR BAND REGULAR (VASCULAR PRODUCTS) ×2 IMPLANT
ELECT DEFIB PAD ADLT CADENCE (PAD) ×2 IMPLANT
GLIDESHEATH SLEND SS 6F .021 (SHEATH) ×2 IMPLANT
GUIDEWIRE INQWIRE 1.5J.035X260 (WIRE) ×1 IMPLANT
INQWIRE 1.5J .035X260CM (WIRE) ×2
KIT ENCORE 26 ADVANTAGE (KITS) ×2 IMPLANT
KIT HEART LEFT (KITS) ×2 IMPLANT
KIT HEMO VALVE WATCHDOG (MISCELLANEOUS) ×2 IMPLANT
PACK CARDIAC CATHETERIZATION (CUSTOM PROCEDURE TRAY) ×2 IMPLANT
SHEATH 6FR 75 DEST SLENDER (SHEATH) ×2 IMPLANT
TRANSDUCER W/STOPCOCK (MISCELLANEOUS) ×2 IMPLANT
TUBING CIL FLEX 10 FLL-RA (TUBING) ×2 IMPLANT
WIRE ASAHI PROWATER 300CM (WIRE) ×2 IMPLANT

## 2021-08-16 NOTE — Progress Notes (Signed)
[facility] night shift telemetry coverage note.  The patient had a recurrence of chest pain.  The patient was anxious, but his physical exam was otherwise benign.  He had clear lungs.  Heart was S1-S2, RRR, no JVD, no lower extremities pitting edema.  The pain was partially relieved by nitroglycerin sublingually but he necessitated fentanyl 50 mcg IVP to control the pain completely.  Repeat EKG showed anterior leads ST elevation not meeting criteria for STEMI.  However, after reviewing the EKG tracings with cardiology on-call arrangements to transfer to a progressive bed [facility] were made as he will need a cardiac cath at some point later today.  He was transferred to stepdown here at [facility] while waiting for transfer, nitroglycerin infusion was started and lorazepam 0.5 mg IVP x1 dose given.  Tennis Must, MD.

## 2021-08-16 NOTE — Consult Note (Addendum)
Cardiology Consultation:   Patient ID: Robert Osborne MRN: [id number]; DOB: Jun 13, 1946  Admit date: 08/15/2021 Date of Consult: 08/16/2021  PCP:  System, Provider Not In   [place] Providers Cardiologist:  Minus Breeding, MD  (2019); Recently followed by Dr. Orlan Leavens with [facility]  Patient Profile:   Robert Osborne is a 75 y.o. male with a hx of HTN, HLD, Type 2 DM, chest pain (low-risk NST in 04/2021 at [facility]) and history of bladder cancer who is being seen 08/16/2021 for the evaluation of NSTEMI at the request of Dr. Wynetta Emery.  History of Present Illness:   Robert Osborne presented to [facility] ED on 08/15/2021 for evaluation of exertional chest pain which had started earlier in the day. In talking with the patient today, he reports having intermittent chest pain for the past several months which led to him having a NST earlier this year. He reports his pain was more significant yesterday as he developed sternal pressure with walking from room to room in his home and the pressure radiated into his neck and left arm. Reports associated dyspnea and diaphoresis. No nausea or vomiting. Says his activity has been more limited over the past few years due to weakness in his legs but he has been less active over the past few months as activity would bring about his chest pain as well. No recent orthopnea, PND or pitting edema.   Initial labs showed WBC 5.5, Hgb 13.2, platelets 283, +134, K+ 4.2 and creatinine 1.30.  BNP 31.  COVID-negative. FLP and Hgb A1c pending. Initial Hs Tropinin 28 with repeat values of 108, 232 and 789.  CXR showing COPD with right apical scarring but no acute findings. EKG shows sinus bradycardia, HR 57 with no acute ST changes. CTA was negative for a PE but was limited at the subsegmental level due to motion artifact. Was noted to have a 5 mm right upper lobe nodule with repeat noncontrast CT recommended in 3 to 6 months. Was also noted to have aortic  atherosclerosis and coronary artery calcifications.  He has been started on IV Heparin along with IV NTG. Was continued on ASA $Remo'81mg'tYeQr$  daily and PTA Toprol-XL $RemoveBefor'50mg'xHmvePlqBwOz$  daily. Statin therapy not yet ordered (will order high-intensity statin).   Past Medical History:  Diagnosis Date   Bladder cancer ([place]) 08/18/2013   Cancer ([place])    papillary urethral carcinoma,low grade, non-invasive   Diabetes mellitus without complication ([place])    Erectile dysfunction 10/24/2016   Hyperlipidemia associated with type 2 diabetes mellitus ([place]) 05/17/2013   Hypertension associated with diabetes ([place])    Hypokalemia 11/27/2017   Hypomagnesemia 11/27/2017   Major frontotemporal neurocognitive disorder, probable, with behavioral disturbance 11/27/2017   Vitamin D deficiency 05/17/2013    Past Surgical History:  Procedure Laterality Date   COLONOSCOPY W/ POLYPECTOMY       Home Medications:  Prior to Admission medications   Medication Sig Start Date End Date Taking? Authorizing Provider  amLODipine (NORVASC) 10 MG tablet Take 1 tablet (10 mg total) by mouth daily. (Needs to be seen before next refill) 02/22/20  Yes Hawks, Christy A, FNP  aspirin 325 MG tablet Take 1 tablet (325 mg total) by mouth daily. 08/20/19  Yes Johnson, Clanford L, MD  insulin aspart (NOVOLOG) 100 UNIT/ML FlexPen Inject 9 Units into the skin 2 (two) times daily with a meal. 01/10/20  Yes Hawks, Christy A, FNP  insulin degludec (TRESIBA FLEXTOUCH) 100 UNIT/ML SOPN FlexTouch Pen Inject 0.05 mLs (5 Units  total) into the skin daily. Patient taking differently: Inject 18 Units into the skin daily. 08/10/19  Yes Hawks, Christy A, FNP  metoprolol succinate (TOPROL-XL) 25 MG 24 hr tablet Take 50 mg by mouth daily.   Yes [provider]  nitroGLYCERIN (NITROSTAT) 0.4 MG SL tablet Place 0.4 mg under the tongue every 5 (five) minutes as needed for chest pain.   Yes [provider]  OLANZapine (ZYPREXA) 10 MG tablet Take 10 mg by mouth at  bedtime.   Yes [provider]  potassium chloride SA (KLOR-CON) 20 MEQ tablet Take 1 tablet (20 mEq total) by mouth daily. 12/20/19  Yes Hawks, Christy A, FNP  sulfamethoxazole-trimethoprim (BACTRIM DS) 800-160 MG tablet Take 1 tablet by mouth 2 (two) times daily. Starting 9.1.22 x 7 days. 08/13/21  Yes [provider]  tamsulosin (FLOMAX) 0.4 MG CAPS capsule Take 0.4 mg by mouth daily.   Yes [provider]  blood glucose meter kit and supplies KIT Dispense based on patient and insurance preference. Use up to twice a daily as directed. (FOR ICD-10 - type 2 DM uncontrolled E11.9) 08/19/19   Johnson, Clanford L, MD  blood glucose meter kit and supplies Dispense based on patient and insurance preference. Use up to four times daily as directed. (FOR ICD-10 E10.9, E11.9). 08/23/19   Evelina Dun A, FNP  blood glucose meter kit and supplies Use up to four times daily as directed. dx E11.9 08/23/19   Terald Sleeper, PA-C  Blood Glucose Monitoring Suppl (ONE TOUCH ULTRA 2) w/Device KIT USE TO CHECK BLOOD SUGAR UP TO 4 TIMES DAILY AS DIRECTED 05/15/20   Evelina Dun A, FNP  glucose blood test strip Use as instructed 05/15/20   Evelina Dun A, FNP  Insulin Pen Needle (PEN NEEDLES) 32G X 4 MM MISC 100 each by Does not apply route 4 (four) times daily. E10.9 05/16/20   Sharion Balloon, FNP  Lancets (ONETOUCH DELICA PLUS [id number]) MISC USE TO CHECK BLOOD SUGAR UP TO 4 TIMES A DAY AS DIRECTED 01/23/20   Sharion Balloon, FNP    Inpatient Medications: Scheduled Meds:  aspirin EC  81 mg Oral Daily   Chlorhexidine Gluconate Cloth  6 each Topical Daily   feeding supplement (GLUCERNA SHAKE)  237 mL Oral TID BM   insulin aspart  0-5 Units Subcutaneous QHS   insulin aspart  0-9 Units Subcutaneous TID WC   metoprolol succinate  50 mg Oral Daily   OLANZapine  10 mg Oral QHS   rosuvastatin  40 mg Oral Daily   sodium chloride flush  3 mL Intravenous Q12H   tamsulosin  0.4 mg Oral Daily    Continuous Infusions:  sodium chloride     Followed by   sodium chloride     heparin 800 Units/hr (08/16/21 0052)   nitroGLYCERIN 5 mcg/min (08/16/21 0557)   PRN Meds: acetaminophen, fentaNYL (SUBLIMAZE) injection, ondansetron (ZOFRAN) IV  Allergies:    Allergies  Allergen Reactions   Enalapril Cough    Social History:   Social History   Socioeconomic History   Marital status: Married    Spouse name: Not on file   Number of children: 3   Years of education: Not on file   Highest education level: Not on file  Occupational History   Occupation: landscaping    Comment: retired, works part time  Tobacco Use   Smoking status: Former    Types: Cigarettes    Quit date: 05/17/1973  Years since quitting: 48.2   Smokeless tobacco: Never  Vaping Use   Vaping Use: Never used  Substance and Sexual Activity   Alcohol use: No   Drug use: No   Sexual activity: Not on file  Other Topics Concern   Not on file  Social History Narrative   Still works as Development worker, international aid.      Social Determinants of Health   Financial Resource Strain: Not on file  Food Insecurity: Not on file  Transportation Needs: Not on file  Physical Activity: Not on file  Stress: Not on file  Social Connections: Not on file  Intimate Partner Violence: Not on file    Family History:    Family History  Problem Relation Age of Onset   Heart attack Maternal Grandmother    Stroke Maternal Grandfather    Heart attack Mother 61     ROS:  Please see the history of present illness.   All other ROS reviewed and negative.     Physical Exam/Data:   Vitals:   08/16/21 0300 08/16/21 0354 08/16/21 0600 08/16/21 0700  BP: (!) 151/85 (!) 155/84 118/74   Pulse: 64 61 70   Resp: $Remo'16 20 17   'qjJsY$ Temp:  98.2 F (36.8 C)  98 F (36.7 C)  TempSrc:  Oral  Oral  SpO2: 99% 98% 98%   Weight:  67.6 kg    Height:        Intake/Output Summary (Last 24 hours) at 08/16/2021 0845 Last data filed at 08/15/2021 2202 Gross per  24 hour  Intake 500 ml  Output --  Net 500 ml   Last 3 Weights 08/16/2021 08/15/2021 08/29/2019  Weight (lbs) 149 lb 0.5 oz 155 lb 149 lb 12.8 oz  Weight (kg) 67.6 kg 70.308 kg 67.949 kg     Body mass index is 23.34 kg/m.  General:  Well nourished, well developed male appearing in in no acute distress. HEENT: normal Lymph: no adenopathy Neck: no JVD Endocrine:  No thryomegaly Vascular: No carotid bruits; FA pulses 2+ bilaterally without bruits  Cardiac:  normal S1, S2; RRR; no murmur. Lungs:  clear to auscultation bilaterally, no wheezing, rhonchi or rales  Abd: soft, nontender, no hepatomegaly  Ext: no pitting edema Musculoskeletal:  No deformities, BUE and BLE strength normal and equal Skin: warm and dry  Neuro:  CNs 2-12 intact, no focal abnormalities noted Psych:  Normal affect   EKG:  The EKG was personally reviewed and demonstrates: Sinus bradycardia, HR 57 with no acute ST changes.   Telemetry:  Telemetry was personally reviewed and demonstrates: NSR, HR in 60's to 70's with occasional PVC's.   Relevant CV Studies:  Echocardiogram: 08/2019 IMPRESSIONS     1. The left ventricle has normal systolic function, with an ejection  fraction of 55-60%. The cavity size was normal. There is mildly increased  left ventricular wall thickness. Left ventricular diastolic Doppler  parameters are consistent with impaired  relaxation.   2. The right ventricle has normal systolic function. The cavity was  normal. There is no increase in right ventricular wall thickness.   3. The aortic valve is tricuspid. Mild aortic annular calcification  noted.   4. The mitral valve is grossly normal.   5. The tricuspid valve is grossly normal.   6. The aorta is normal unless otherwise noted.   NST: 04/2021 STRESS ECG RESULTS:   The patient's baseline EKG is noted to show sinus rhythm.   The patient's EKG during monitoring period showed  sinus rhythm without ST segment changes or arrhythmias.    Stress EKG without changes suggestive of ischemia.    IMAGING FINDINGS:   This is a technically adequate study.   EDV: 53ml  ESV: 55ml   The stress gated images revealed left ventricular ejection fraction of 57% with normal wall motion.   Comparison of stress and rest images revealed no reversible defects by myocardial perfusion imaging.     IMPRESSION:   1. No evidence of inducible ischemia.  2. Normal left ventricular function.    Laboratory Data:  High Sensitivity Troponin:   Recent Labs  Lab 08/15/21 1614 08/15/21 1904 08/15/21 2253 08/16/21 0102 08/16/21 0614  TROPONINIHS 14 28* 108* 232* 769*     Chemistry Recent Labs  Lab 08/15/21 1614 08/16/21 0614  NA 134* 139  K 4.2 3.7  CL 107 107  CO2 22 24  GLUCOSE 205* 104*  BUN 24* 20  CREATININE 1.30* 1.16  CALCIUM 8.2* 8.4*  GFRNONAA 57* >60  ANIONGAP 5 8    Recent Labs  Lab 08/15/21 1614 08/16/21 0614  PROT 7.1 6.4*  ALBUMIN 3.3* 3.0*  AST 23 64*  ALT 17 20  ALKPHOS 93 83  BILITOT 0.4 0.5   Hematology Recent Labs  Lab 08/15/21 1614 08/16/21 0614  WBC 5.5 5.3  RBC 4.66 4.68  HGB 13.2 13.2  HCT 41.6 40.7  MCV 89.3 87.0  MCH 28.3 28.2  MCHC 31.7 32.4  RDW 14.0 14.1  PLT 283 282   BNP Recent Labs  Lab 08/15/21 1614  BNP 31.0    DDimer No results for input(s): DDIMER in the last 168 hours.   Radiology/Studies:  DG Chest 2 View  Result Date: 08/15/2021 CLINICAL DATA:  Chest pain EXAM: CHEST - 2 VIEW COMPARISON:  08/17/2019 FINDINGS: Normal heart size, mediastinal contours, and pulmonary vascularity. Atherosclerotic calcification aorta. Emphysematous and bronchitic changes consistent with COPD. RIGHT apical scarring. No acute infiltrate, pleural effusion, or pneumothorax. Bones demineralized with chronic superior endplate height loss of a lower thoracic vertebra. IMPRESSION: COPD changes with RIGHT apical scarring. No acute abnormalities. Electronically Signed   By: Lavonia Dana M.D.    On: 08/15/2021 18:50   CT Angio Chest PE W and/or Wo Contrast  Result Date: 08/15/2021 CLINICAL DATA:  Chest pain today, nitro given at urgent care without relief. Patient poor historian. Rule out pulmonary embolus. EXAM: CT ANGIOGRAPHY CHEST WITH CONTRAST TECHNIQUE: Multidetector CT imaging of the chest was performed using the standard protocol during bolus administration of intravenous contrast. Multiplanar CT image reconstructions and MIPs were obtained to evaluate the vascular anatomy. CONTRAST:  4mL OMNIPAQUE IOHEXOL 350 MG/ML SOLN COMPARISON:  None. FINDINGS: Cardiovascular: Satisfactory opacification of the pulmonary arteries to the segmental level. Limited evaluation of the subsegmental levels due to motion artifact. No evidence of pulmonary embolism. Normal heart size. No significant pericardial effusion. The thoracic aorta is normal in caliber. Moderate atherosclerotic plaque of the thoracic aorta. Four-vessel coronary artery calcifications. Mediastinum/Nodes: Borderline enlarged 1 cm right lymph node. No enlarged mediastinal, hilar, or axillary lymph nodes. Slightly patulous distal esophagus. Otherwise thyroid gland, trachea, and esophagus demonstrate no significant findings. Lungs/Pleura: Biapical pleural/pulmonary scarring. Bibasilar linear atelectasis slightly elevated left hemidiaphragm. No focal consolidation. Right upper lobe nodular like subsolid density measuring up to 5 mm (7:63, 6:55). No pulmonary mass. No pleural effusion. No pneumothorax. Upper Abdomen: Partially visualized fluid density lesion within the left kidney. No acute abnormality. Musculoskeletal: Bilateral trace gynecomastia.  No chest wall abnormality. No  suspicious lytic or blastic osseous lesions. No acute displaced fracture. Review of the MIP images confirms the above findings. IMPRESSION: 1. No central or segmental pulmonary embolus. Limited evaluation at the subsegmental level due to motion artifact. 2. A 5 mm right upper  lobe subsolid nodular-like density. Non-contrast chest CT at 3-6 months is recommended. If nodules persist and are stable at that time, consider additional non-contrast chest CT examinations at 2 and 4 years. This recommendation follows the consensus statement: Guidelines for Management of Incidental Pulmonary Nodules Detected on CT Images: From the Fleischner Society 2017; Radiology 2017; 284:228-243. 3. Nonspecific borderline enlarged 1 cm right lymph node. 4.  Aortic Atherosclerosis (ICD10-I70.0). Electronically Signed   By: Iven Finn M.D.   On: 08/15/2021 19:22     Assessment and Plan:   1. NSTEMI - Presented with worsening exertional chest pain which has been occurring for the past few months but intensified the day of admission. Found to have an NSTEMI with most recent Hs Troponin at 789. EKG without acute ST changes. Echocardiogram pending.  - Given his presenting symptoms and elevated enzymes, he would benefit from a cardiac catheterization. The patient understands that risks include but are not limited to stroke (1 in 1000), death (1 in 12), kidney failure [usually temporary] (1 in 500), bleeding (1 in 200), allergic reaction [possibly serious] (1 in 200).  He has been NPO since midnight. Will put the patient on the Cardiology service once at [place] ASA, Toprol-XL, Heparin and IV NTG (currently pain-free). Will add high-intensity statin therapy with Crestor 40mg  daily.   2. HTN - His BP has been well-controlled this admission, at 118/74 on most recent check. Continue Toprol-XL 50mg  daily and IV NTG.   3. HLD - FLP this AM shows LDL at 128. Will start Crestor 40mg  daily. Recheck FLP and LFT's in 6-8 weeks.   4. Type 2 DM - Hgb A1c pending. SSI has been ordered by the admitting team.   5. Abnormal Chest CT - CTA this admission showed a 5 mm right upper lobe nodule with repeat noncontrast CT recommended in 3 to 6 months.    Risk Assessment/Risk Scores:     TIMI  Risk Score for Unstable Angina or Non-ST Elevation MI:   The patient's TIMI risk score is 5, which indicates a 26% risk of all cause mortality, new or recurrent myocardial infarction or need for urgent revascularization in the next 14 days.  For questions or updates, please contact [place] Please consult [email or web address] for contact info under    Signed, Erma Heritage, PA-C  08/16/2021 8:45 AM    Attending note:  Patient seen and examined.  I reviewed his records and discussed the case with Delano Metz, I agree with her above findings.  Mr. Wernick presents to the hospital reporting worsening episodes of recurrent chest pain, significantly worse chest pressure yesterday just with walking from room to room in his house.  He was followed back in 2019 by Dr. Percival Spanish, more recently with [facility] and underwent a low risk stress test in May of this year at [facility].  He does have evidence of aortic and coronary calcifications by CT imaging with additional history including hypertension and type 2 diabetes mellitus.  On examination this morning he appears comfortable, no active chest pain.  He is afebrile, heart rate in the 60s in sinus rhythm on telemetry which I personally reviewed.  He did have a single ventricular couplet.  Blood pressure ranging  115-150.  Lungs are clear without wheezing.  Cardiac exam with RRR and 1/6 systolic murmur, no gallop.  Pertinent lab work includes potassium 3.7, BUN 20, creatinine 1.16, AST 64, ALT 20, peak high-sensitivity troponin I 769, LDL 128, hemoglobin 13.2, platelets 282, hemoglobin A1c 8%.  Chest CTA showed no evidence of pulmonary embolus, 5 mm right upper lobe nodular density, aortic atherosclerosis and multivessel coronary artery calcification.  I personally reviewed his ECG which shows sinus bradycardia.  NSTEMI with worsening angina symptoms over the last few months, particularly last 24 hours as discussed above.  Risks and benefits of a  diagnostic cardiac catheterization discussed with the patient and his wife, they are in agreement and he will be transferred to [facility] for procedure today.  Continue aspirin, Toprol-XL, heparin, and nitroglycerin.  Adding high-dose Crestor.  Consider SGLT2 inhibitor eventually as well.  Satira Sark, M.D., F.A.C.C.

## 2021-08-16 NOTE — Progress Notes (Signed)
ANTICOAGULATION CONSULT NOTE - Initial Consult  Pharmacy Consult for heparin Indication: chest pain/ACS  Allergies  Allergen Reactions   Enalapril Cough    Patient Measurements: Height: '5\' 7"'$  (170.2 cm) Weight: 70.3 kg (155 lb) IBW/kg (Calculated) : 66.1  Vital Signs: Temp: 98.1 F (36.7 C) (09/01 1539) Temp Source: Oral (09/01 1539) BP: 151/83 (09/01 2250) Pulse Rate: 63 (09/01 2250)  Labs: Recent Labs    08/15/21 1614 08/15/21 1904 08/15/21 2253  HGB 13.2  --   --   HCT 41.6  --   --   PLT 283  --   --   CREATININE 1.30*  --   --   TROPONINIHS 14 28* 108*    Estimated Creatinine Clearance: 44.5 mL/min (A) (by C-G formula based on SCr of 1.3 mg/dL (H)).   Medical History: Past Medical History:  Diagnosis Date   Bladder cancer ([place]) 08/18/2013   Cancer ([place])    papillary urethral carcinoma,low grade, non-invasive   Diabetes mellitus without complication ([place])    Erectile dysfunction 10/24/2016   Hyperlipidemia associated with type 2 diabetes mellitus ([place]) 05/17/2013   Hypertension associated with diabetes ([place])    Hypokalemia 11/27/2017   Hypomagnesemia 11/27/2017   Major frontotemporal neurocognitive disorder, probable, with behavioral disturbance 11/27/2017   Vitamin D deficiency 05/17/2013    Assessment: 75yo male c/o CP x several days that worsened today, initial troponin negative but now rising, to begin heparin.  Goal of Therapy:  Heparin level 0.3-0.7 units/ml Monitor platelets by anticoagulation protocol: Yes   Plan:  Heparin 3000 units IV bolus x1 followed by infusion at 800 units/hr and monitor heparin levels and CBC.  Wynona Neat, PharmD, BCPS  08/16/2021,12:31 AM

## 2021-08-16 NOTE — Progress Notes (Signed)
ANTICOAGULATION CONSULT NOTE  Pharmacy Consult for heparin Indication: chest pain/ACS  Allergies  Allergen Reactions   Enalapril Cough    Patient Measurements: Height: '5\' 7"'$  (170.2 cm) Weight: 67.6 kg (149 lb 0.5 oz) IBW/kg (Calculated) : 66.1  Vital Signs: Temp: 98 F (36.7 C) (09/02 1100) Temp Source: Oral (09/02 1100) BP: 181/91 (09/02 1455) Pulse Rate: 81 (09/02 1455)  Labs: Recent Labs    08/15/21 1614 08/15/21 1904 08/15/21 2253 08/16/21 0102 08/16/21 0614 08/16/21 0915  HGB 13.2  --   --   --  13.2  --   HCT 41.6  --   --   --  40.7  --   PLT 283  --   --   --  282  --   APTT  --   --   --  186* 46*  --   LABPROT  --   --   --  14.5 13.8  --   INR  --   --   --  1.1 1.1  --   HEPARINUNFRC  --   --   --   --   --  0.14*  CREATININE 1.30*  --   --   --  1.16  --   TROPONINIHS 14   < > 108* 232* 769*  --    < > = values in this interval not displayed.     Estimated Creatinine Clearance: 49.9 mL/min (by C-G formula based on SCr of 1.16 mg/dL).   Medical History: Past Medical History:  Diagnosis Date   Bladder cancer ([place]) 08/18/2013   Cancer ([place])    papillary urethral carcinoma,low grade, non-invasive   Diabetes mellitus without complication ([place])    Erectile dysfunction 10/24/2016   Hyperlipidemia associated with type 2 diabetes mellitus ([place]) 05/17/2013   Hypertension associated with diabetes ([place])    Hypokalemia 11/27/2017   Hypomagnesemia 11/27/2017   Major frontotemporal neurocognitive disorder, probable, with behavioral disturbance 11/27/2017   Vitamin D deficiency 05/17/2013    Assessment: 75yo male c/o CP x several days that worsened today, initial troponin negative but now rising, to begin heparin.  Cath today finding multivessel CAD - plan for cardiac surgery consult. Plan to restart heparin 8 hours post sheath removal (sheath removed on 9/2'@1456'$ ). Hgb 13.2, plt 282. No s/sx of bleeding. Plan for IV tirofiban for 12 hours post-cath.   Goal  of Therapy:  Heparin level 0.3-0.5 units/ml while on concurrent tirofiban, can increase to 0.3-0.5 once stopped Monitor platelets by anticoagulation protocol: Yes   Plan:  Will restart heparin infusion at 1000 units/hr (was previous subtherapeutic on 800 units/hr) on 9/2'@2300'$  Order heparin level 8 hours after restart  Monitor daily HL, CBC, and for s/sx of bleeding   Antonietta Jewel, PharmD, BCCCP Clinical Pharmacist  Phone: [phone] 08/16/2021 4:12 PM  Please check AMION for all [place] phone numbers After 10:00 PM, call [place] [phone]

## 2021-08-16 NOTE — ED Notes (Signed)
Date and time results received: 08/16/21 0144 (use smartphrase ".now" to insert current time)  Test: Troponin Critical Value: 232  Name of Provider Notified: Dr. Olevia Bowens

## 2021-08-16 NOTE — Progress Notes (Signed)
Spoke with [facility] call center, a truck is en route to transport the patient now. Will continue to monitor and let attending RN know.

## 2021-08-16 NOTE — Progress Notes (Signed)
Lab reported critical of troponin > 4000. MD aware.

## 2021-08-16 NOTE — Progress Notes (Signed)
08/16/2021 0340 PTT: 186  Notified Dr. Olevia Bowens

## 2021-08-16 NOTE — Interval H&P Note (Signed)
Cath Lab Visit (complete for each Cath Lab visit)  Clinical Evaluation Leading to the Procedure:   ACS: Yes.    Non-ACS:    Anginal Classification: CCS IV  Anti-ischemic medical therapy: Minimal Therapy (1 class of medications)  Non-Invasive Test Results: No non-invasive testing performed  Prior CABG: No previous CABG      History and Physical Interval Note:  08/16/2021 1:47 PM  Robert Osborne  has presented today for surgery, with the diagnosis of NSTEMI.  The various methods of treatment have been discussed with the patient and family. After consideration of risks, benefits and other options for treatment, the patient has consented to  Procedure(s): LEFT HEART CATH AND CORONARY ANGIOGRAPHY (N/A) as a surgical intervention.  The patient's history has been reviewed, patient examined, no change in status, stable for surgery.  I have reviewed the patient's chart and labs.  Questions were answered to the patient's satisfaction.     Larae Grooms

## 2021-08-16 NOTE — Progress Notes (Signed)
Nutrition Brief Note   Per Cardiology MD-[name] is a 75 y.o. male with a hx of HTN, HLD, Type 2 DM, chest pain (low-risk NST in 04/2021 at [facility]) and history of bladder cancer who is being seen 08/16/2021 for the evaluation of NSTEMI at the request of Dr. Wynetta Emery.   Patient identified on the Malnutrition Screening Tool (MST) Report. Chart reviewed.  Wt Readings from Last 15 Encounters:  08/16/21 67.6 kg  08/29/19 67.9 kg  08/17/19 68.4 kg  08/09/19 68.2 kg  07/29/19 69 kg  02/15/19 72.1 kg  11/03/18 71.8 kg  08/11/18 71.7 kg  08/11/18 71 kg  06/08/18 72.3 kg  01/28/18 70.5 kg  01/14/18 72.2 kg  12/31/17 71.5 kg  12/16/17 70.3 kg  11/20/17 69.3 kg    Body mass index is 23.34 kg/m. Patient meets criteria for normal based on current BMI.   Current diet order is NPO at this time for procedure. He is transferring to [facility] for cardiac cath procedure.  Labs and medications reviewed.  BMP Latest Ref Rng & Units 08/16/2021 08/15/2021 08/29/2019  Glucose 70 - 99 mg/dL 104(H) 205(H) 228(H)  BUN 8 - 23 mg/dL 20 24(H) 18  Creatinine 0.61 - 1.24 mg/dL 1.16 1.30(H) 1.03  BUN/Creat Ratio 10 - 24 - - 17  Sodium 135 - 145 mmol/L 139 134(L) 136  Potassium 3.5 - 5.1 mmol/L 3.7 4.2 3.9  Chloride 98 - 111 mmol/L 107 107 102  CO2 22 - 32 mmol/L '24 22 21  '$ Calcium 8.9 - 10.3 mg/dL 8.4(L) 8.2(L) 9.2     No nutrition interventions warranted at this time. If nutrition issues arise, please consult RD.   Colman Cater MS,RD,CSG,LDN

## 2021-08-16 NOTE — H&P (View-Only) (Signed)
Cardiology Consultation:   Patient ID: Robert Osborne MRN: [id number]; DOB: 1946/08/13  Admit date: 08/15/2021 Date of Consult: 08/16/2021  PCP:  System, Provider Not In   [facility] Providers Cardiologist:  Minus Breeding, MD  (2019); Recently followed by Dr. Orlan Leavens with [facility]  Patient Profile:   Robert Osborne is a 75 y.o. male with a hx of HTN, HLD, Type 2 DM, chest pain (low-risk NST in 04/2021 at [facility]) and history of bladder cancer who is being seen 08/16/2021 for the evaluation of NSTEMI at the request of Dr. Wynetta Emery.  History of Present Illness:   Robert Osborne presented to [facility] ED on 08/15/2021 for evaluation of exertional chest pain which had started earlier in the day. In talking with the patient today, he reports having intermittent chest pain for the past several months which led to him having a NST earlier this year. He reports his pain was more significant yesterday as he developed sternal pressure with walking from room to room in his home and the pressure radiated into his neck and left arm. Reports associated dyspnea and diaphoresis. No nausea or vomiting. Says his activity has been more limited over the past few years due to weakness in his legs but he has been less active over the past few months as activity would bring about his chest pain as well. No recent orthopnea, PND or pitting edema.   Initial labs showed WBC 5.5, Hgb 13.2, platelets 283, +134, K+ 4.2 and creatinine 1.30.  BNP 31.  COVID-negative. FLP and Hgb A1c pending. Initial Hs Tropinin 28 with repeat values of 108, 232 and 789.  CXR showing COPD with right apical scarring but no acute findings. EKG shows sinus bradycardia, HR 57 with no acute ST changes. CTA was negative for a PE but was limited at the subsegmental level due to motion artifact. Was noted to have a 5 mm right upper lobe nodule with repeat noncontrast CT recommended in 3 to 6 months. Was also noted to have aortic  atherosclerosis and coronary artery calcifications.  He has been started on IV Heparin along with IV NTG. Was continued on ASA $Remo'81mg'BrNjE$  daily and PTA Toprol-XL $RemoveBefor'50mg'dEdrTnJowGCF$  daily. Statin therapy not yet ordered (will order high-intensity statin).   Past Medical History:  Diagnosis Date   Bladder cancer ([place]) 08/18/2013   Cancer ([place])    papillary urethral carcinoma,low grade, non-invasive   Diabetes mellitus without complication ([place])    Erectile dysfunction 10/24/2016   Hyperlipidemia associated with type 2 diabetes mellitus ([place]) 05/17/2013   Hypertension associated with diabetes ([place])    Hypokalemia 11/27/2017   Hypomagnesemia 11/27/2017   Major frontotemporal neurocognitive disorder, probable, with behavioral disturbance 11/27/2017   Vitamin D deficiency 05/17/2013    Past Surgical History:  Procedure Laterality Date   COLONOSCOPY W/ POLYPECTOMY       Home Medications:  Prior to Admission medications   Medication Sig Start Date End Date Taking? Authorizing Provider  amLODipine (NORVASC) 10 MG tablet Take 1 tablet (10 mg total) by mouth daily. (Needs to be seen before next refill) 02/22/20  Yes Hawks, Christy A, FNP  aspirin 325 MG tablet Take 1 tablet (325 mg total) by mouth daily. 08/20/19  Yes Johnson, Clanford L, MD  insulin aspart (NOVOLOG) 100 UNIT/ML FlexPen Inject 9 Units into the skin 2 (two) times daily with a meal. 01/10/20  Yes Hawks, Christy A, FNP  insulin degludec (TRESIBA FLEXTOUCH) 100 UNIT/ML SOPN FlexTouch Pen Inject 0.05 mLs (5 Units  total) into the skin daily. Patient taking differently: Inject 18 Units into the skin daily. 08/10/19  Yes Hawks, Christy A, FNP  metoprolol succinate (TOPROL-XL) 25 MG 24 hr tablet Take 50 mg by mouth daily.   Yes [provider]  nitroGLYCERIN (NITROSTAT) 0.4 MG SL tablet Place 0.4 mg under the tongue every 5 (five) minutes as needed for chest pain.   Yes [provider]  OLANZapine (ZYPREXA) 10 MG tablet Take 10 mg by mouth at  bedtime.   Yes [provider]  potassium chloride SA (KLOR-CON) 20 MEQ tablet Take 1 tablet (20 mEq total) by mouth daily. 12/20/19  Yes Hawks, Christy A, FNP  sulfamethoxazole-trimethoprim (BACTRIM DS) 800-160 MG tablet Take 1 tablet by mouth 2 (two) times daily. Starting 9.1.22 x 7 days. 08/13/21  Yes [provider]  tamsulosin (FLOMAX) 0.4 MG CAPS capsule Take 0.4 mg by mouth daily.   Yes [provider]  blood glucose meter kit and supplies KIT Dispense based on patient and insurance preference. Use up to twice a daily as directed. (FOR ICD-10 - type 2 DM uncontrolled E11.9) 08/19/19   Johnson, Clanford L, MD  blood glucose meter kit and supplies Dispense based on patient and insurance preference. Use up to four times daily as directed. (FOR ICD-10 E10.9, E11.9). 08/23/19   Evelina Dun A, FNP  blood glucose meter kit and supplies Use up to four times daily as directed. dx E11.9 08/23/19   Terald Sleeper, PA-C  Blood Glucose Monitoring Suppl (ONE TOUCH ULTRA 2) w/Device KIT USE TO CHECK BLOOD SUGAR UP TO 4 TIMES DAILY AS DIRECTED 05/15/20   Evelina Dun A, FNP  glucose blood test strip Use as instructed 05/15/20   Evelina Dun A, FNP  Insulin Pen Needle (PEN NEEDLES) 32G X 4 MM MISC 100 each by Does not apply route 4 (four) times daily. E10.9 05/16/20   Sharion Balloon, FNP  Lancets (ONETOUCH DELICA PLUS [id number]) MISC USE TO CHECK BLOOD SUGAR UP TO 4 TIMES A DAY AS DIRECTED 01/23/20   Sharion Balloon, FNP    Inpatient Medications: Scheduled Meds:  aspirin EC  81 mg Oral Daily   Chlorhexidine Gluconate Cloth  6 each Topical Daily   feeding supplement (GLUCERNA SHAKE)  237 mL Oral TID BM   insulin aspart  0-5 Units Subcutaneous QHS   insulin aspart  0-9 Units Subcutaneous TID WC   metoprolol succinate  50 mg Oral Daily   OLANZapine  10 mg Oral QHS   rosuvastatin  40 mg Oral Daily   sodium chloride flush  3 mL Intravenous Q12H   tamsulosin  0.4 mg Oral Daily    Continuous Infusions:  sodium chloride     Followed by   sodium chloride     heparin 800 Units/hr (08/16/21 0052)   nitroGLYCERIN 5 mcg/min (08/16/21 0557)   PRN Meds: acetaminophen, fentaNYL (SUBLIMAZE) injection, ondansetron (ZOFRAN) IV  Allergies:    Allergies  Allergen Reactions   Enalapril Cough    Social History:   Social History   Socioeconomic History   Marital status: Married    Spouse name: Not on file   Number of children: 3   Years of education: Not on file   Highest education level: Not on file  Occupational History   Occupation: landscaping    Comment: retired, works part time  Tobacco Use   Smoking status: Former    Types: Cigarettes    Quit date: 05/17/1973  Years since quitting: 48.2   Smokeless tobacco: Never  Vaping Use   Vaping Use: Never used  Substance and Sexual Activity   Alcohol use: No   Drug use: No   Sexual activity: Not on file  Other Topics Concern   Not on file  Social History Narrative   Still works as Development worker, international aid.      Social Determinants of Health   Financial Resource Strain: Not on file  Food Insecurity: Not on file  Transportation Needs: Not on file  Physical Activity: Not on file  Stress: Not on file  Social Connections: Not on file  Intimate Partner Violence: Not on file    Family History:    Family History  Problem Relation Age of Onset   Heart attack Maternal Grandmother    Stroke Maternal Grandfather    Heart attack Mother 71     ROS:  Please see the history of present illness.   All other ROS reviewed and negative.     Physical Exam/Data:   Vitals:   08/16/21 0300 08/16/21 0354 08/16/21 0600 08/16/21 0700  BP: (!) 151/85 (!) 155/84 118/74   Pulse: 64 61 70   Resp: $Remo'16 20 17   'fHoeD$ Temp:  98.2 F (36.8 C)  98 F (36.7 C)  TempSrc:  Oral  Oral  SpO2: 99% 98% 98%   Weight:  67.6 kg    Height:        Intake/Output Summary (Last 24 hours) at 08/16/2021 0845 Last data filed at 08/15/2021 2202 Gross per  24 hour  Intake 500 ml  Output --  Net 500 ml   Last 3 Weights 08/16/2021 08/15/2021 08/29/2019  Weight (lbs) 149 lb 0.5 oz 155 lb 149 lb 12.8 oz  Weight (kg) 67.6 kg 70.308 kg 67.949 kg     Body mass index is 23.34 kg/m.  General:  Well nourished, well developed male appearing in in no acute distress. HEENT: normal Lymph: no adenopathy Neck: no JVD Endocrine:  No thryomegaly Vascular: No carotid bruits; FA pulses 2+ bilaterally without bruits  Cardiac:  normal S1, S2; RRR; no murmur. Lungs:  clear to auscultation bilaterally, no wheezing, rhonchi or rales  Abd: soft, nontender, no hepatomegaly  Ext: no pitting edema Musculoskeletal:  No deformities, BUE and BLE strength normal and equal Skin: warm and dry  Neuro:  CNs 2-12 intact, no focal abnormalities noted Psych:  Normal affect   EKG:  The EKG was personally reviewed and demonstrates: Sinus bradycardia, HR 57 with no acute ST changes.   Telemetry:  Telemetry was personally reviewed and demonstrates: NSR, HR in 60's to 70's with occasional PVC's.   Relevant CV Studies:  Echocardiogram: 08/2019 IMPRESSIONS     1. The left ventricle has normal systolic function, with an ejection  fraction of 55-60%. The cavity size was normal. There is mildly increased  left ventricular wall thickness. Left ventricular diastolic Doppler  parameters are consistent with impaired  relaxation.   2. The right ventricle has normal systolic function. The cavity was  normal. There is no increase in right ventricular wall thickness.   3. The aortic valve is tricuspid. Mild aortic annular calcification  noted.   4. The mitral valve is grossly normal.   5. The tricuspid valve is grossly normal.   6. The aorta is normal unless otherwise noted.   NST: 04/2021 STRESS ECG RESULTS:   The patient's baseline EKG is noted to show sinus rhythm.   The patient's EKG during monitoring period showed  sinus rhythm without ST segment changes or arrhythmias.    Stress EKG without changes suggestive of ischemia.    IMAGING FINDINGS:   This is a technically adequate study.   EDV: 50ml  ESV: 81ml   The stress gated images revealed left ventricular ejection fraction of 57% with normal wall motion.   Comparison of stress and rest images revealed no reversible defects by myocardial perfusion imaging.     IMPRESSION:   1. No evidence of inducible ischemia.  2. Normal left ventricular function.    Laboratory Data:  High Sensitivity Troponin:   Recent Labs  Lab 08/15/21 1614 08/15/21 1904 08/15/21 2253 08/16/21 0102 08/16/21 0614  TROPONINIHS 14 28* 108* 232* 769*     Chemistry Recent Labs  Lab 08/15/21 1614 08/16/21 0614  NA 134* 139  K 4.2 3.7  CL 107 107  CO2 22 24  GLUCOSE 205* 104*  BUN 24* 20  CREATININE 1.30* 1.16  CALCIUM 8.2* 8.4*  GFRNONAA 57* >60  ANIONGAP 5 8    Recent Labs  Lab 08/15/21 1614 08/16/21 0614  PROT 7.1 6.4*  ALBUMIN 3.3* 3.0*  AST 23 64*  ALT 17 20  ALKPHOS 93 83  BILITOT 0.4 0.5   Hematology Recent Labs  Lab 08/15/21 1614 08/16/21 0614  WBC 5.5 5.3  RBC 4.66 4.68  HGB 13.2 13.2  HCT 41.6 40.7  MCV 89.3 87.0  MCH 28.3 28.2  MCHC 31.7 32.4  RDW 14.0 14.1  PLT 283 282   BNP Recent Labs  Lab 08/15/21 1614  BNP 31.0    DDimer No results for input(s): DDIMER in the last 168 hours.   Radiology/Studies:  DG Chest 2 View  Result Date: 08/15/2021 CLINICAL DATA:  Chest pain EXAM: CHEST - 2 VIEW COMPARISON:  08/17/2019 FINDINGS: Normal heart size, mediastinal contours, and pulmonary vascularity. Atherosclerotic calcification aorta. Emphysematous and bronchitic changes consistent with COPD. RIGHT apical scarring. No acute infiltrate, pleural effusion, or pneumothorax. Bones demineralized with chronic superior endplate height loss of a lower thoracic vertebra. IMPRESSION: COPD changes with RIGHT apical scarring. No acute abnormalities. Electronically Signed   By: Lavonia Dana M.D.    On: 08/15/2021 18:50   CT Angio Chest PE W and/or Wo Contrast  Result Date: 08/15/2021 CLINICAL DATA:  Chest pain today, nitro given at urgent care without relief. Patient poor historian. Rule out pulmonary embolus. EXAM: CT ANGIOGRAPHY CHEST WITH CONTRAST TECHNIQUE: Multidetector CT imaging of the chest was performed using the standard protocol during bolus administration of intravenous contrast. Multiplanar CT image reconstructions and MIPs were obtained to evaluate the vascular anatomy. CONTRAST:  41mL OMNIPAQUE IOHEXOL 350 MG/ML SOLN COMPARISON:  None. FINDINGS: Cardiovascular: Satisfactory opacification of the pulmonary arteries to the segmental level. Limited evaluation of the subsegmental levels due to motion artifact. No evidence of pulmonary embolism. Normal heart size. No significant pericardial effusion. The thoracic aorta is normal in caliber. Moderate atherosclerotic plaque of the thoracic aorta. Four-vessel coronary artery calcifications. Mediastinum/Nodes: Borderline enlarged 1 cm right lymph node. No enlarged mediastinal, hilar, or axillary lymph nodes. Slightly patulous distal esophagus. Otherwise thyroid gland, trachea, and esophagus demonstrate no significant findings. Lungs/Pleura: Biapical pleural/pulmonary scarring. Bibasilar linear atelectasis slightly elevated left hemidiaphragm. No focal consolidation. Right upper lobe nodular like subsolid density measuring up to 5 mm (7:63, 6:55). No pulmonary mass. No pleural effusion. No pneumothorax. Upper Abdomen: Partially visualized fluid density lesion within the left kidney. No acute abnormality. Musculoskeletal: Bilateral trace gynecomastia.  No chest wall abnormality. No  suspicious lytic or blastic osseous lesions. No acute displaced fracture. Review of the MIP images confirms the above findings. IMPRESSION: 1. No central or segmental pulmonary embolus. Limited evaluation at the subsegmental level due to motion artifact. 2. A 5 mm right upper  lobe subsolid nodular-like density. Non-contrast chest CT at 3-6 months is recommended. If nodules persist and are stable at that time, consider additional non-contrast chest CT examinations at 2 and 4 years. This recommendation follows the consensus statement: Guidelines for Management of Incidental Pulmonary Nodules Detected on CT Images: From the Fleischner Society 2017; Radiology 2017; 284:228-243. 3. Nonspecific borderline enlarged 1 cm right lymph node. 4.  Aortic Atherosclerosis (ICD10-I70.0). Electronically Signed   By: Iven Finn M.D.   On: 08/15/2021 19:22     Assessment and Plan:   1. NSTEMI - Presented with worsening exertional chest pain which has been occurring for the past few months but intensified the day of admission. Found to have an NSTEMI with most recent Hs Troponin at 789. EKG without acute ST changes. Echocardiogram pending.  - Given his presenting symptoms and elevated enzymes, he would benefit from a cardiac catheterization. The patient understands that risks include but are not limited to stroke (1 in 1000), death (1 in 11), kidney failure [usually temporary] (1 in 500), bleeding (1 in 200), allergic reaction [possibly serious] (1 in 200).  He has been NPO since midnight. Will put the patient on the Cardiology service once at [facility] ASA, Toprol-XL, Heparin and IV NTG (currently pain-free). Will add high-intensity statin therapy with Crestor 40mg  daily.   2. HTN - His BP has been well-controlled this admission, at 118/74 on most recent check. Continue Toprol-XL 50mg  daily and IV NTG.   3. HLD - FLP this AM shows LDL at 128. Will start Crestor 40mg  daily. Recheck FLP and LFT's in 6-8 weeks.   4. Type 2 DM - Hgb A1c pending. SSI has been ordered by the admitting team.   5. Abnormal Chest CT - CTA this admission showed a 5 mm right upper lobe nodule with repeat noncontrast CT recommended in 3 to 6 months.    Risk Assessment/Risk Scores:     TIMI  Risk Score for Unstable Angina or Non-ST Elevation MI:   The patient's TIMI risk score is 5, which indicates a 26% risk of all cause mortality, new or recurrent myocardial infarction or need for urgent revascularization in the next 14 days.  For questions or updates, please contact [place] Please consult [email or web address] for contact info under    Signed, Erma Heritage, PA-C  08/16/2021 8:45 AM    Attending note:  Patient seen and examined.  I reviewed his records and discussed the case with Delano Metz, I agree with her above findings.  Robert Osborne presents to the hospital reporting worsening episodes of recurrent chest pain, significantly worse chest pressure yesterday just with walking from room to room in his house.  He was followed back in 2019 by Dr. Percival Spanish, more recently with [facility] and underwent a low risk stress test in May of this year at [facility].  He does have evidence of aortic and coronary calcifications by CT imaging with additional history including hypertension and type 2 diabetes mellitus.  On examination this morning he appears comfortable, no active chest pain.  He is afebrile, heart rate in the 60s in sinus rhythm on telemetry which I personally reviewed.  He did have a single ventricular couplet.  Blood pressure ranging  115-150.  Lungs are clear without wheezing.  Cardiac exam with RRR and 1/6 systolic murmur, no gallop.  Pertinent lab work includes potassium 3.7, BUN 20, creatinine 1.16, AST 64, ALT 20, peak high-sensitivity troponin I 769, LDL 128, hemoglobin 13.2, platelets 282, hemoglobin A1c 8%.  Chest CTA showed no evidence of pulmonary embolus, 5 mm right upper lobe nodular density, aortic atherosclerosis and multivessel coronary artery calcification.  I personally reviewed his ECG which shows sinus bradycardia.  NSTEMI with worsening angina symptoms over the last few months, particularly last 24 hours as discussed above.  Risks and benefits of a  diagnostic cardiac catheterization discussed with the patient and his wife, they are in agreement and he will be transferred to [facility] for procedure today.  Continue aspirin, Toprol-XL, heparin, and nitroglycerin.  Adding high-dose Crestor.  Consider SGLT2 inhibitor eventually as well.  Satira Sark, M.D., F.A.C.C.

## 2021-08-16 NOTE — Progress Notes (Signed)
ANTICOAGULATION CONSULT NOTE -   Pharmacy Consult for heparin Indication: chest pain/ACS  Allergies  Allergen Reactions   Enalapril Cough    Patient Measurements: Height: '5\' 7"'$  (170.2 cm) Weight: 67.6 kg (149 lb 0.5 oz) IBW/kg (Calculated) : 66.1  Vital Signs: Temp: 98 F (36.7 C) (09/02 0700) Temp Source: Oral (09/02 0700) BP: 118/74 (09/02 0600) Pulse Rate: 70 (09/02 0600)  Labs: Recent Labs    08/15/21 1614 08/15/21 1904 08/15/21 2253 08/16/21 0102 08/16/21 0614 08/16/21 0915  HGB 13.2  --   --   --  13.2  --   HCT 41.6  --   --   --  40.7  --   PLT 283  --   --   --  282  --   APTT  --   --   --  186* 46*  --   LABPROT  --   --   --  14.5 13.8  --   INR  --   --   --  1.1 1.1  --   HEPARINUNFRC  --   --   --   --   --  0.14*  CREATININE 1.30*  --   --   --  1.16  --   TROPONINIHS 14   < > 108* 232* 769*  --    < > = values in this interval not displayed.     Estimated Creatinine Clearance: 49.9 mL/min (by C-G formula based on SCr of 1.16 mg/dL).   Medical History: Past Medical History:  Diagnosis Date   Bladder cancer ([place]) 08/18/2013   Cancer ([place])    papillary urethral carcinoma,low grade, non-invasive   Diabetes mellitus without complication ([place])    Erectile dysfunction 10/24/2016   Hyperlipidemia associated with type 2 diabetes mellitus ([place]) 05/17/2013   Hypertension associated with diabetes ([place])    Hypokalemia 11/27/2017   Hypomagnesemia 11/27/2017   Major frontotemporal neurocognitive disorder, probable, with behavioral disturbance 11/27/2017   Vitamin D deficiency 05/17/2013    Assessment: 75yo male c/o CP x several days that worsened today, initial troponin negative but now rising, to begin heparin.  Goal of Therapy:  Heparin level 0.3-0.7 units/ml Monitor platelets by anticoagulation protocol: Yes   Plan:  Heparin 2000 units IV bolus, Increase heparin infusion to 1000 units/hr and monitor heparin levels and CBC.  Isac Sarna, BS  Vena Austria, BCPS Clinical Pharmacist Pager [phone] 08/16/2021,10:22 AM

## 2021-08-16 NOTE — Progress Notes (Signed)
[facility] night shift telemetry coverage note.  The nursing staff communicated to [place] that the patient's troponin increased from 108 to 232 ng/L.  He is currently asymptomatic and sleeping.  He is on a continuous heparin infusion.  Cardiology will be consulting in the morning.  I have added a follow-up troponin 6 hours from the last 1, morning EKG and echocardiogram.  Tennis Must, MD.

## 2021-08-17 DIAGNOSIS — R079 Chest pain, unspecified: Secondary | ICD-10-CM | POA: Diagnosis not present

## 2021-08-17 LAB — GLUCOSE, CAPILLARY
Glucose-Capillary: 127 mg/dL — ABNORMAL HIGH (ref 70–99)
Glucose-Capillary: 192 mg/dL — ABNORMAL HIGH (ref 70–99)
Glucose-Capillary: 173 mg/dL — ABNORMAL HIGH (ref 70–99)
Glucose-Capillary: 139 mg/dL — ABNORMAL HIGH (ref 70–99)

## 2021-08-17 LAB — CBC
HCT: 36.7 % — ABNORMAL LOW (ref 39.0–52.0)
Hemoglobin: 12 g/dL — ABNORMAL LOW (ref 13.0–17.0)
MCH: 27.5 pg (ref 26.0–34.0)
MCHC: 32.7 g/dL (ref 30.0–36.0)
MCV: 84.2 fL (ref 80.0–100.0)
Platelets: 299 10*3/uL (ref 150–400)
RBC: 4.36 MIL/uL (ref 4.22–5.81)
RDW: 14.2 % (ref 11.5–15.5)
WBC: 8.6 10*3/uL (ref 4.0–10.5)
nRBC: 0 % (ref 0.0–0.2)

## 2021-08-17 LAB — BASIC METABOLIC PANEL
Anion gap: 7 (ref 5–15)
BUN: 20 mg/dL (ref 8–23)
CO2: 23 mmol/L (ref 22–32)
Calcium: 8.3 mg/dL — ABNORMAL LOW (ref 8.9–10.3)
Chloride: 105 mmol/L (ref 98–111)
Creatinine, Ser: 1.47 mg/dL — ABNORMAL HIGH (ref 0.61–1.24)
GFR, Estimated: 49 mL/min — ABNORMAL LOW (ref >60)
Glucose, Bld: 180 mg/dL — ABNORMAL HIGH (ref 70–99)
Potassium: 4.1 mmol/L (ref 3.5–5.1)
Sodium: 135 mmol/L (ref 135–145)

## 2021-08-17 LAB — HEPARIN LEVEL (UNFRACTIONATED)
Heparin Unfractionated: <0.1 IU/mL — ABNORMAL LOW (ref 0.30–0.70)
Heparin Unfractionated: 0.14 IU/mL — ABNORMAL LOW (ref 0.30–0.70)

## 2021-08-17 MED FILL — Nitroglycerin IV Soln 100 MCG/ML in D5W: INTRA_ARTERIAL | Qty: 10 | Status: AC

## 2021-08-17 NOTE — Progress Notes (Signed)
Progress Note  Patient Name: Robert Osborne Date of Encounter: 08/17/2021  Primary Cardiologist: Minus Breeding, MD   Subjective   Stable today. Seen in 2H02. Feeling well and mentating clearly. No CP overnight. Feels he rested well. Wants to try walking today.   Inpatient Medications    Scheduled Meds:  aspirin EC  81 mg Oral Daily   Chlorhexidine Gluconate Cloth  6 each Topical Daily   feeding supplement (GLUCERNA SHAKE)  237 mL Oral TID BM   insulin aspart  0-5 Units Subcutaneous QHS   insulin aspart  0-9 Units Subcutaneous TID WC   mouth rinse  15 mL Mouth Rinse BID   metoprolol succinate  50 mg Oral Daily   OLANZapine  10 mg Oral QHS   rosuvastatin  40 mg Oral Daily   sodium chloride flush  3 mL Intravenous Q12H   sodium chloride flush  3 mL Intravenous Q12H   tamsulosin  0.4 mg Oral Daily   Continuous Infusions:  sodium chloride     sodium chloride 1 mL/kg/hr (08/16/21 1024)   heparin 1,000 Units/hr (08/17/21 0400)   nitroGLYCERIN 15 mcg/min (08/17/21 0400)   PRN Meds: sodium chloride, acetaminophen, fentaNYL (SUBLIMAZE) injection, ondansetron (ZOFRAN) IV, sodium chloride flush   Vital Signs    Vitals:   08/17/21 0200 08/17/21 0300 08/17/21 0400 08/17/21 0548  BP: (!) 150/81 110/64    Pulse: 80 72    Resp: 20 (!) 22 19   Temp:   98 F (36.7 C)   TempSrc:      SpO2: 97% 93%    Weight:    65.4 kg  Height:        Intake/Output Summary (Last 24 hours) at 08/17/2021 0603 Last data filed at 08/17/2021 0500 Gross per 24 hour  Intake 1052.34 ml  Output 1625 ml  Net -572.66 ml   Filed Weights   08/15/21 1540 08/16/21 0354 08/17/21 0548  Weight: 70.3 kg 67.6 kg 65.4 kg    Telemetry    SR - Personally Reviewed  ECG    NSR 08/17/21- Personally Reviewed  Physical Exam   GEN: No acute distress.   Neck: No JVD Cardiac: regular rhythm, normal rate, no murmurs, rubs, or gallops.  Respiratory: Clear to auscultation bilaterally. GI: Soft, nontender,  non-distended  MS: No edema; No deformity. Neuro:  Nonfocal  Psych: Normal affect   Labs    Chemistry Recent Labs  Lab 08/15/21 1614 08/16/21 0614 08/17/21 0058  NA 134* 139 135  K 4.2 3.7 4.1  CL 107 107 105  CO2 '22 24 23  '$ GLUCOSE 205* 104* 180*  BUN 24* 20 20  CREATININE 1.30* 1.16 1.47*  CALCIUM 8.2* 8.4* 8.3*  PROT 7.1 6.4*  --   ALBUMIN 3.3* 3.0*  --   AST 23 64*  --   ALT 17 20  --   ALKPHOS 93 83  --   BILITOT 0.4 0.5  --   GFRNONAA 57* >60 49*  ANIONGAP '5 8 7     '$ Hematology Recent Labs  Lab 08/15/21 1614 08/16/21 0614 08/17/21 0058  WBC 5.5 5.3 8.6  RBC 4.66 4.68 4.36  HGB 13.2 13.2 12.0*  HCT 41.6 40.7 36.7*  MCV 89.3 87.0 84.2  MCH 28.3 28.2 27.5  MCHC 31.7 32.4 32.7  RDW 14.0 14.1 14.2  PLT 283 282 299    Cardiac EnzymesNo results for input(s): TROPONINI in the last 168 hours. No results for input(s): TROPIPOC in the last 168 hours.  BNP Recent Labs  Lab 08/15/21 1614  BNP 31.0     DDimer No results for input(s): DDIMER in the last 168 hours.   Radiology    DG Chest 2 View  Result Date: 08/15/2021 CLINICAL DATA:  Chest pain EXAM: CHEST - 2 VIEW COMPARISON:  08/17/2019 FINDINGS: Normal heart size, mediastinal contours, and pulmonary vascularity. Atherosclerotic calcification aorta. Emphysematous and bronchitic changes consistent with COPD. RIGHT apical scarring. No acute infiltrate, pleural effusion, or pneumothorax. Bones demineralized with chronic superior endplate height loss of a lower thoracic vertebra. IMPRESSION: COPD changes with RIGHT apical scarring. No acute abnormalities. Electronically Signed   By: Lavonia Dana M.D.   On: 08/15/2021 18:50   CT Angio Chest PE W and/or Wo Contrast  Result Date: 08/15/2021 CLINICAL DATA:  Chest pain today, nitro given at urgent care without relief. Patient poor historian. Rule out pulmonary embolus. EXAM: CT ANGIOGRAPHY CHEST WITH CONTRAST TECHNIQUE: Multidetector CT imaging of the chest was  performed using the standard protocol during bolus administration of intravenous contrast. Multiplanar CT image reconstructions and MIPs were obtained to evaluate the vascular anatomy. CONTRAST:  67m OMNIPAQUE IOHEXOL 350 MG/ML SOLN COMPARISON:  None. FINDINGS: Cardiovascular: Satisfactory opacification of the pulmonary arteries to the segmental level. Limited evaluation of the subsegmental levels due to motion artifact. No evidence of pulmonary embolism. Normal heart size. No significant pericardial effusion. The thoracic aorta is normal in caliber. Moderate atherosclerotic plaque of the thoracic aorta. Four-vessel coronary artery calcifications. Mediastinum/Nodes: Borderline enlarged 1 cm right lymph node. No enlarged mediastinal, hilar, or axillary lymph nodes. Slightly patulous distal esophagus. Otherwise thyroid gland, trachea, and esophagus demonstrate no significant findings. Lungs/Pleura: Biapical pleural/pulmonary scarring. Bibasilar linear atelectasis slightly elevated left hemidiaphragm. No focal consolidation. Right upper lobe nodular like subsolid density measuring up to 5 mm (7:63, 6:55). No pulmonary mass. No pleural effusion. No pneumothorax. Upper Abdomen: Partially visualized fluid density lesion within the left kidney. No acute abnormality. Musculoskeletal: Bilateral trace gynecomastia.  No chest wall abnormality. No suspicious lytic or blastic osseous lesions. No acute displaced fracture. Review of the MIP images confirms the above findings. IMPRESSION: 1. No central or segmental pulmonary embolus. Limited evaluation at the subsegmental level due to motion artifact. 2. A 5 mm right upper lobe subsolid nodular-like density. Non-contrast chest CT at 3-6 months is recommended. If nodules persist and are stable at that time, consider additional non-contrast chest CT examinations at 2 and 4 years. This recommendation follows the consensus statement: Guidelines for Management of Incidental Pulmonary  Nodules Detected on CT Images: From the Fleischner Society 2017; Radiology 2017; 284:228-243. 3. Nonspecific borderline enlarged 1 cm right lymph node. 4.  Aortic Atherosclerosis (ICD10-I70.0). Electronically Signed   By: MIven FinnM.D.   On: 08/15/2021 19:22   CARDIAC CATHETERIZATION  Result Date: 08/16/2021   OColon FlatteryCx to Prox Cx lesion is 75% stenosed.   2nd Mrg lesion is 80% stenosed.   1st Mrg lesion is 90% stenosed.   Ost LAD to Prox LAD lesion is 25% stenosed.   1st Diag lesion is 90% stenosed.   Mid LAD lesion is 90% stenosed.   Dist LAD lesion is 100% stenosed.  Flow restored with a wire.  Unable to advance balloon.   Mid RCA lesion is 25% stenosed.   RPAV lesion is 90% stenosed.   Balloon angioplasty was performed using a BALLOON SAPPHIRE 1.0X10.   Post intervention, there is a 95% residual stenosis.   There is mild to moderate left ventricular systolic  dysfunction.   LV end diastolic pressure is normal.   The left ventricular ejection fraction is 45-50% by visual estimate.   There is no aortic valve stenosis. Severe disease in the proximal to mid LAD, and ostial circumflex.Marland Kitchen  Heavily calcified lesion at the origin of a large first diagonal.  Culprit for today's presentation was an occlusion in the mid LAD, past the areas of heavy calcification.  This was successfully wired and flow restored, but a balloon could not be advanced through the heavily calcific disease more proximally.  Will obtain cardiac surgery consult.  If surgery not an option, will have to see if there is an appropriate high risk PCI strategy. Continue IV heparin after 8 hours post sheath removal.  IV tirofiban started in the Cath Lab which will continue for 12 hours. Severe tortuosity of the right subclavian requiring 75 cm destination sheath.   ECHOCARDIOGRAM COMPLETE  Result Date: 08/16/2021    ECHOCARDIOGRAM REPORT   Patient Name:   ESTEPHAN LAPIETRA Date of Exam: 08/16/2021 Medical Rec #:  [id number]       Height:       67.0 in  Accession #:    [id number]      Weight:       149.0 lb Date of Birth:  09/04/1946       BSA:          1.785 m Patient Age:    33 years        BP:           118/74 mmHg Patient Gender: M               HR:           70 bpm. Exam Location:  [facility] Procedure: 2D Echo, Cardiac Doppler and Color Doppler Indications:    Elevated Troponin  History:        Patient has prior history of Echocardiogram examinations, most                 recent 08/18/2019. Arrythmias:PVC, Signs/Symptoms:Chest Pain; Risk                 Factors:Hypertension, Diabetes and Dyslipidemia.  Sonographer:    Wenda Low Referring Phys: [id number] [place]  1. Left ventricular ejection fraction, by estimation, is 55%. The left ventricle has normal function. The left ventricle demonstrates regional wall motion abnormalities (see scoring diagram/findings for description). There is moderate asymmetric left ventricular hypertrophy of the basal and septal segments. Left ventricular diastolic parameters are consistent with Grade I diastolic dysfunction (impaired relaxation).  2. Right ventricular systolic function is normal. The right ventricular size is normal. Tricuspid regurgitation signal is inadequate for assessing PA pressure.  3. The mitral valve is grossly normal. Trivial mitral valve regurgitation.  4. The aortic valve is tricuspid. Aortic valve regurgitation is not visualized. No aortic stenosis is present. Aortic valve mean gradient measures 2.0 mmHg.  5. The inferior vena cava is normal in size with greater than 50% respiratory variability, suggesting right atrial pressure of 3 mmHg. Comparison(s): Prior images reviewed side by side. LVEF approximately 55% with new periapical wall motion abnormality suggestive of mid ot distal LAD disease versus possible stress-induced cardiomyopathy. FINDINGS  Left Ventricle: Left ventricular ejection fraction, by estimation, is 55%. The left ventricle has normal function. The left  ventricle demonstrates regional wall motion abnormalities. The left ventricular internal cavity size was normal in size. There is  moderate asymmetric left ventricular hypertrophy  of the basal and septal segments. Left ventricular diastolic parameters are consistent with Grade I diastolic dysfunction (impaired relaxation).  LV Wall Scoring: The mid and distal anterior septum and entire apex are akinetic. The anterior wall, antero-lateral wall, inferior wall, posterior wall, basal anteroseptal segment, mid inferoseptal segment, and basal inferoseptal segment are normal. Right Ventricle: The right ventricular size is normal. No increase in right ventricular wall thickness. Right ventricular systolic function is normal. Tricuspid regurgitation signal is inadequate for assessing PA pressure. Left Atrium: Left atrial size was normal in size. Right Atrium: Right atrial size was normal in size. Pericardium: There is no evidence of pericardial effusion. Presence of pericardial fat pad. Mitral Valve: The mitral valve is grossly normal. Trivial mitral valve regurgitation. MV peak gradient, 3.9 mmHg. The mean mitral valve gradient is 1.0 mmHg. Tricuspid Valve: The tricuspid valve is grossly normal. Tricuspid valve regurgitation is trivial. Aortic Valve: The aortic valve is tricuspid. There is mild aortic valve annular calcification. Aortic valve regurgitation is not visualized. No aortic stenosis is present. Aortic valve mean gradient measures 2.0 mmHg. Aortic valve peak gradient measures 4.8 mmHg. Aortic valve area, by VTI measures 3.16 cm. Pulmonic Valve: The pulmonic valve was not well visualized. Pulmonic valve regurgitation is trivial. Aorta: The aortic root is normal in size and structure. Venous: The inferior vena cava is normal in size with greater than 50% respiratory variability, suggesting right atrial pressure of 3 mmHg. IAS/Shunts: No atrial level shunt detected by color flow Doppler.  LEFT VENTRICLE PLAX 2D  LVIDd:         3.49 cm  Diastology LVIDs:         2.46 cm  LV e' medial:    4.68 cm/s LV PW:         1.20 cm  LV E/e' medial:  9.7 LV IVS:        1.39 cm  LV e' lateral:   7.07 cm/s LVOT diam:     2.10 cm  LV E/e' lateral: 6.4 LV SV:         73 LV SV Index:   41 LVOT Area:     3.46 cm  RIGHT VENTRICLE RV Basal diam:  3.34 cm RV Mid diam:    3.07 cm LEFT ATRIUM             Index       RIGHT ATRIUM           Index LA diam:        3.40 cm 1.91 cm/m  RA Area:     12.20 cm LA Vol (A2C):   45.6 ml 25.55 ml/m RA Volume:   24.30 ml  13.62 ml/m LA Vol (A4C):   39.9 ml 22.36 ml/m LA Biplane Vol: 45.7 ml 25.61 ml/m  AORTIC VALVE AV Area (Vmax):    3.21 cm AV Area (Vmean):   3.30 cm AV Area (VTI):     3.16 cm AV Vmax:           109.00 cm/s AV Vmean:          74.400 cm/s AV VTI:            0.231 m AV Peak Grad:      4.8 mmHg AV Mean Grad:      2.0 mmHg LVOT Vmax:         101.00 cm/s LVOT Vmean:        70.800 cm/s LVOT VTI:  0.211 m LVOT/AV VTI ratio: 0.91  AORTA Ao Root diam: 3.20 cm MITRAL VALVE MV Area (PHT): 3.06 cm    SHUNTS MV Area VTI:   2.97 cm    Systemic VTI:  0.21 m MV Peak grad:  3.9 mmHg    Systemic Diam: 2.10 cm MV Mean grad:  1.0 mmHg MV Vmax:       0.98 m/s MV Vmean:      41.5 cm/s MV Decel Time: 248 msec MV E velocity: 45.40 cm/s MV A velocity: 96.40 cm/s MV E/A ratio:  0.47 Rozann Lesches MD Electronically signed by Rozann Lesches MD Signature Date/Time: 08/16/2021/12:35:11 PM    Final     Cardiac Studies   Echo and cath as above.  Patient Profile     75 y.o. male HTN, HLD, Type 2 DM, chest pain and history of bladder cancer with NSTEMI and severe, complex CAD.  Assessment & Plan   Principal Problem:   Chest pain Active Problems:   Hyperlipidemia associated with type 2 diabetes mellitus ([place])   Hypertension associated with diabetes ([place])   Right upper lobe pulmonary nodule   Hypoalbuminemia due to protein-calorie malnutrition (HCC)   AKI (acute kidney injury) ([place])    Hyperglycemia due to diabetes mellitus (HCC)   BPH (benign prostatic hyperplasia)   NSTEMI (non-ST elevated myocardial infarction) ([place])   Acute anterior wall MI ([place])   NSTEMI -awaiting cardiac surgical consult for decision regarding CABG vs high risk PCI - Per Dr. Irish Lack cath report: "Severe disease in the proximal to mid LAD, and ostial circumflex.Marland Kitchen Heavily calcified lesion at the origin of a large first diagonal. Culprit for today's presentation was an occlusion in the mid LAD, past the areas of heavy calcification. This was successfully wired and flow restored, but a balloon could not be advanced through the heavily calcific disease more proximally." - continue ASA 81 mg daily - continue metoprolol succinate 50 mg daily - continue crestor 40 mg daily - continue IV heparin. Completed tirofiban. - continue IV nitro  2. HTN - BP overall well controlled, Continue Toprol-XL '50mg'$  daily and IV NTG.    3. HLD - FLP this AM shows LDL at 128. Crestor '40mg'$  daily. Recheck FLP and LFT's in 6-8 weeks.    4. Type 2 DM - Hgb A1c 8.0%. SSI has been ordered by the admitting team. We may want to add SGLT2I.   5. Abnormal Chest CT - CTA this admission showed a 5 mm right upper lobe nodule with repeat noncontrast CT recommended in 3 to 6 months.   For questions or updates, please contact [place] Please consult [email or web address] for contact info under        Signed, Elouise Munroe, MD  08/17/2021, 6:03 AM

## 2021-08-17 NOTE — Progress Notes (Signed)
ANTICOAGULATION CONSULT NOTE  Pharmacy Consult for heparin Indication: chest pain/ACS  Allergies  Allergen Reactions   Enalapril Cough    Patient Measurements: Height: '5\' 7"'$  (170.2 cm) Weight: 65.4 kg (144 lb 2.9 oz) IBW/kg (Calculated) : 66.1  Vital Signs: Temp: 98.3 F (36.8 C) (09/03 0700) Temp Source: Oral (09/03 0700) BP: 146/72 (09/03 0700) Pulse Rate: 64 (09/03 0700)  Labs: Recent Labs    08/15/21 1614 08/15/21 1904 08/16/21 0102 08/16/21 0614 08/16/21 0915 08/16/21 1650 08/17/21 0058 08/17/21 0720  HGB 13.2  --   --  13.2  --   --  12.0*  --   HCT 41.6  --   --  40.7  --   --  36.7*  --   PLT 283  --   --  282  --   --  299  --   APTT  --   --  186* 46*  --   --   --   --   LABPROT  --   --  14.5 13.8  --   --   --   --   INR  --   --  1.1 1.1  --   --   --   --   HEPARINUNFRC  --   --   --   --  0.14*  --   --  <0.10*  CREATININE 1.30*  --   --  1.16  --   --  1.47*  --   TROPONINIHS 14   < > 232* 769*  --  4,567*  --   --    < > = values in this interval not displayed.     Estimated Creatinine Clearance: 38.9 mL/min (A) (by C-G formula based on SCr of 1.47 mg/dL (H)).   Medical History: Past Medical History:  Diagnosis Date   Bladder cancer ([place]) 08/18/2013   Cancer ([place])    papillary urethral carcinoma,low grade, non-invasive   Diabetes mellitus without complication ([place])    Erectile dysfunction 10/24/2016   Hyperlipidemia associated with type 2 diabetes mellitus ([place]) 05/17/2013   Hypertension associated with diabetes ([place])    Hypokalemia 11/27/2017   Hypomagnesemia 11/27/2017   Major frontotemporal neurocognitive disorder, probable, with behavioral disturbance 11/27/2017   Vitamin D deficiency 05/17/2013    Assessment: 75yo male c/o CP x several days that worsened today, initial troponin negative but now rising, to begin heparin.  Cath yesterday finding multivessel CAD - plan for cardiac surgery consult to determine CABG vs. High risk PCI. IV  tirofiban for 12 hours post-cath completed and now off. Heprin restarted 9/2'@2300'$ ). Hgb 12.0, plt 299. No s/sx of bleeding.   HL subtherapeutic at < 0.1  Goal of Therapy:  Heparin level 0.3-0.7 units/ml updated now that tirofiban is off Monitor platelets by anticoagulation protocol: Yes   Plan:  Will increase heparin infusion at 1200 units/hr Order heparin level 8 hours Monitor daily HL, CBC, and for s/sx of bleeding   Cathrine Muster, PharmD PGY2 Cardiology Pharmacy Resident Phone: [phone] 08/17/2021  8:17 AM  Please check [email or web address] for unit-specific pharmacy phone numbers.   Please check AMION for all [place] phone numbers After 10:00 PM, call [place] [phone]

## 2021-08-17 NOTE — Progress Notes (Signed)
Patient has severe sundown. Patient was appropriate with safety measures earlier on. Patient has gotten  out of the bed 4 times within the last hour. He is easily reoriented and agreeable but repeats  action when left alone. Will implement floor mats and monitor closely.

## 2021-08-17 NOTE — Progress Notes (Signed)
ANTICOAGULATION CONSULT NOTE  Pharmacy Consult for heparin Indication: chest pain/ACS  Allergies  Allergen Reactions   Enalapril Cough    Patient Measurements: Height: '5\' 7"'$  (170.2 cm) Weight: 65.4 kg (144 lb 2.9 oz) IBW/kg (Calculated) : 66.1  Vital Signs: Temp: 98.1 F (36.7 C) (09/03 1551) Temp Source: Oral (09/03 1551) BP: 143/84 (09/03 2000) Pulse Rate: 71 (09/03 2000)  Labs: Recent Labs    08/15/21 1614 08/15/21 1904 08/16/21 0102 08/16/21 [id number] 08/16/21 0915 08/16/21 1650 08/17/21 0058 08/17/21 0720 08/17/21 1624  HGB 13.2  --   --  13.2  --   --  12.0*  --   --   HCT 41.6  --   --  40.7  --   --  36.7*  --   --   PLT 283  --   --  282  --   --  299  --   --   APTT  --   --  186* 46*  --   --   --   --   --   LABPROT  --   --  14.5 13.8  --   --   --   --   --   INR  --   --  1.1 1.1  --   --   --   --   --   HEPARINUNFRC  --   --   --   --  0.14*  --   --  <0.10* 0.14*  CREATININE 1.30*  --   --  1.16  --   --  1.47*  --   --   TROPONINIHS 14   < > 232* 769*  --  4,567*  --   --   --    < > = values in this interval not displayed.     Estimated Creatinine Clearance: 38.9 mL/min (A) (by C-G formula based on SCr of 1.47 mg/dL (H)).   Medical History: Past Medical History:  Diagnosis Date   Bladder cancer ([place]) 08/18/2013   Cancer ([place])    papillary urethral carcinoma,low grade, non-invasive   Diabetes mellitus without complication ([place])    Erectile dysfunction 10/24/2016   Hyperlipidemia associated with type 2 diabetes mellitus ([place]) 05/17/2013   Hypertension associated with diabetes ([place])    Hypokalemia 11/27/2017   Hypomagnesemia 11/27/2017   Major frontotemporal neurocognitive disorder, probable, with behavioral disturbance 11/27/2017   Vitamin D deficiency 05/17/2013    Assessment: 75yo male c/o CP x several days that worsened today, initial troponin negative but now rising, to begin heparin.  Cath yesterday finding multivessel CAD - plan for  cardiac surgery consult to determine CABG vs. High risk PCI. IV tirofiban for 12 hours post-cath completed and now off. Heprin restarted 9/2'@2300'$ ). Hgb 12.0, plt 299. No s/sx of bleeding.   Heparin drip 1200 uts/hr with heparin level 0.14 < goal   Goal of Therapy:  Heparin level 0.3-0.7 units/ml updated now that tirofiban is off Monitor platelets by anticoagulation protocol: Yes   Plan:  Will increase heparin infusion 1400 units/hr Monitor daily HL, CBC, and for s/sx of bleeding    Bonnita Nasuti Pharm.D. CPP, BCPS Clinical Pharmacist [phone] 08/17/2021 8:23 PM   Please check AMION for all [facility] phone numbers After 10:00 PM, call [place] [phone]

## 2021-08-18 ENCOUNTER — Other Ambulatory Visit (HOSPITAL_COMMUNITY): Payer: Medicare HMO

## 2021-08-18 DIAGNOSIS — R079 Chest pain, unspecified: Secondary | ICD-10-CM | POA: Diagnosis not present

## 2021-08-18 DIAGNOSIS — I10 Essential (primary) hypertension: Secondary | ICD-10-CM

## 2021-08-18 DIAGNOSIS — I25110 Atherosclerotic heart disease of native coronary artery with unstable angina pectoris: Secondary | ICD-10-CM

## 2021-08-18 DIAGNOSIS — I214 Non-ST elevation (NSTEMI) myocardial infarction: Secondary | ICD-10-CM

## 2021-08-18 DIAGNOSIS — E7849 Other hyperlipidemia: Secondary | ICD-10-CM

## 2021-08-18 LAB — GLUCOSE, CAPILLARY
Glucose-Capillary: 137 mg/dL — ABNORMAL HIGH (ref 70–99)
Glucose-Capillary: 183 mg/dL — ABNORMAL HIGH (ref 70–99)
Glucose-Capillary: 150 mg/dL — ABNORMAL HIGH (ref 70–99)
Glucose-Capillary: 223 mg/dL — ABNORMAL HIGH (ref 70–99)

## 2021-08-18 LAB — CBC
HCT: 34.3 % — ABNORMAL LOW (ref 39.0–52.0)
Hemoglobin: 11.3 g/dL — ABNORMAL LOW (ref 13.0–17.0)
MCH: 28 pg (ref 26.0–34.0)
MCHC: 32.9 g/dL (ref 30.0–36.0)
MCV: 84.9 fL (ref 80.0–100.0)
Platelets: 266 10*3/uL (ref 150–400)
RBC: 4.04 MIL/uL — ABNORMAL LOW (ref 4.22–5.81)
RDW: 14.1 % (ref 11.5–15.5)
WBC: 6.4 10*3/uL (ref 4.0–10.5)
nRBC: 0 % (ref 0.0–0.2)

## 2021-08-18 LAB — BASIC METABOLIC PANEL
Anion gap: 10 (ref 5–15)
BUN: 18 mg/dL (ref 8–23)
CO2: 23 mmol/L (ref 22–32)
Calcium: 8.6 mg/dL — ABNORMAL LOW (ref 8.9–10.3)
Chloride: 102 mmol/L (ref 98–111)
Creatinine, Ser: 1.45 mg/dL — ABNORMAL HIGH (ref 0.61–1.24)
GFR, Estimated: 50 mL/min — ABNORMAL LOW (ref >60)
Glucose, Bld: 218 mg/dL — ABNORMAL HIGH (ref 70–99)
Potassium: 5.1 mmol/L (ref 3.5–5.1)
Sodium: 135 mmol/L (ref 135–145)

## 2021-08-18 LAB — HEPARIN LEVEL (UNFRACTIONATED): Heparin Unfractionated: 0.38 IU/mL (ref 0.30–0.70)

## 2021-08-18 MED ORDER — AMLODIPINE BESYLATE 10 MG PO TABS
10.0000 mg | ORAL_TABLET | Freq: Every day | ORAL | Status: DC
  Administered 2021-08-18 – 2021-08-21 (×4): 10 mg via ORAL
  Filled 2021-08-18 (×4): qty 1

## 2021-08-18 NOTE — Progress Notes (Signed)
Progress Note  Patient Name: Robert Osborne Date of Encounter: 08/18/2021  Primary Cardiologist: Minus Breeding, MD   Subjective   Seen in 2H02. No CP. Feeling emotional about plan for CABG on Thursday. Mild intermittent headache from nitro gtt.  Inpatient Medications    Scheduled Meds:  aspirin EC  81 mg Oral Daily   Chlorhexidine Gluconate Cloth  6 each Topical Daily   feeding supplement (GLUCERNA SHAKE)  237 mL Oral TID BM   insulin aspart  0-5 Units Subcutaneous QHS   insulin aspart  0-9 Units Subcutaneous TID WC   mouth rinse  15 mL Mouth Rinse BID   metoprolol succinate  50 mg Oral Daily   OLANZapine  10 mg Oral QHS   rosuvastatin  40 mg Oral Daily   sodium chloride flush  3 mL Intravenous Q12H   sodium chloride flush  3 mL Intravenous Q12H   tamsulosin  0.4 mg Oral Daily   Continuous Infusions:  sodium chloride     sodium chloride 1 mL/kg/hr (08/16/21 1024)   heparin 1,400 Units/hr (08/18/21 0700)   nitroGLYCERIN 30 mcg/min (08/18/21 0700)   PRN Meds: sodium chloride, acetaminophen, fentaNYL (SUBLIMAZE) injection, ondansetron (ZOFRAN) IV, sodium chloride flush   Vital Signs    Vitals:   08/18/21 0500 08/18/21 0600 08/18/21 0630 08/18/21 0700  BP: (!) 146/74 (!) 147/72  (!) 149/65  Pulse: (!) 56 64 64 60  Resp: 19 17 (!) 21 19  Temp:    98.3 F (36.8 C)  TempSrc:    Oral  SpO2: 98% 95% 93% 95%  Weight:      Height:        Intake/Output Summary (Last 24 hours) at 08/18/2021 0849 Last data filed at 08/18/2021 0700 Gross per 24 hour  Intake 943.61 ml  Output 2350 ml  Net -1406.39 ml   Filed Weights   08/15/21 1540 08/16/21 0354 08/17/21 0548  Weight: 70.3 kg 67.6 kg 65.4 kg    Telemetry    SR - Personally Reviewed  ECG    NSR 08/17/21- Personally Reviewed  Physical Exam   GEN: No acute distress.   Neck: No JVD Cardiac: RRR, no murmurs, rubs, or gallops.  Respiratory: Clear to auscultation bilaterally. GI: Soft, nontender, non-distended   MS: No edema; No deformity. Neuro:  Nonfocal  Psych: Normal affect   Labs    Chemistry Recent Labs  Lab 08/15/21 1614 08/16/21 0614 08/17/21 0058  NA 134* 139 135  K 4.2 3.7 4.1  CL 107 107 105  CO2 '22 24 23  '$ GLUCOSE 205* 104* 180*  BUN 24* 20 20  CREATININE 1.30* 1.16 1.47*  CALCIUM 8.2* 8.4* 8.3*  PROT 7.1 6.4*  --   ALBUMIN 3.3* 3.0*  --   AST 23 64*  --   ALT 17 20  --   ALKPHOS 93 83  --   BILITOT 0.4 0.5  --   GFRNONAA 57* >60 49*  ANIONGAP '5 8 7     '$ Hematology Recent Labs  Lab 08/16/21 0614 08/17/21 0058 08/18/21 0133  WBC 5.3 8.6 6.4  RBC 4.68 4.36 4.04*  HGB 13.2 12.0* 11.3*  HCT 40.7 36.7* 34.3*  MCV 87.0 84.2 84.9  MCH 28.2 27.5 28.0  MCHC 32.4 32.7 32.9  RDW 14.1 14.2 14.1  PLT 282 299 266    Cardiac EnzymesNo results for input(s): TROPONINI in the last 168 hours. No results for input(s): TROPIPOC in the last 168 hours.   BNP Recent Labs  Lab 08/15/21 1614  BNP 31.0     DDimer No results for input(s): DDIMER in the last 168 hours.   Radiology    CARDIAC CATHETERIZATION  Result Date: 08/16/2021   Colon Flattery Cx to Prox Cx lesion is 75% stenosed.   2nd Mrg lesion is 80% stenosed.   1st Mrg lesion is 90% stenosed.   Ost LAD to Prox LAD lesion is 25% stenosed.   1st Diag lesion is 90% stenosed.   Mid LAD lesion is 90% stenosed.   Dist LAD lesion is 100% stenosed.  Flow restored with a wire.  Unable to advance balloon.   Mid RCA lesion is 25% stenosed.   RPAV lesion is 90% stenosed.   Balloon angioplasty was performed using a BALLOON SAPPHIRE 1.0X10.   Post intervention, there is a 95% residual stenosis.   There is mild to moderate left ventricular systolic dysfunction.   LV end diastolic pressure is normal.   The left ventricular ejection fraction is 45-50% by visual estimate.   There is no aortic valve stenosis. Severe disease in the proximal to mid LAD, and ostial circumflex.Marland Kitchen  Heavily calcified lesion at the origin of a large first diagonal.  Culprit  for today's presentation was an occlusion in the mid LAD, past the areas of heavy calcification.  This was successfully wired and flow restored, but a balloon could not be advanced through the heavily calcific disease more proximally.  Will obtain cardiac surgery consult.  If surgery not an option, will have to see if there is an appropriate high risk PCI strategy. Continue IV heparin after 8 hours post sheath removal.  IV tirofiban started in the Cath Lab which will continue for 12 hours. Severe tortuosity of the right subclavian requiring 75 cm destination sheath.   ECHOCARDIOGRAM COMPLETE  Result Date: 08/16/2021    ECHOCARDIOGRAM REPORT   Patient Name:   Robert Osborne Date of Exam: 08/16/2021 Medical Rec #:  [id number]       Height:       67.0 in Accession #:    [id number]      Weight:       149.0 lb Date of Birth:  1946-04-19       BSA:          1.785 m Patient Age:    75 years        BP:           118/74 mmHg Patient Gender: M               HR:           70 bpm. Exam Location:  [facility] Procedure: 2D Echo, Cardiac Doppler and Color Doppler Indications:    Elevated Troponin  History:        Patient has prior history of Echocardiogram examinations, most                 recent 08/18/2019. Arrythmias:PVC, Signs/Symptoms:Chest Pain; Risk                 Factors:Hypertension, Diabetes and Dyslipidemia.  Sonographer:    Wenda Low Referring Phys: [id number] [place]  1. Left ventricular ejection fraction, by estimation, is 55%. The left ventricle has normal function. The left ventricle demonstrates regional wall motion abnormalities (see scoring diagram/findings for description). There is moderate asymmetric left ventricular hypertrophy of the basal and septal segments. Left ventricular diastolic parameters are consistent with Grade I diastolic dysfunction (impaired relaxation).  2. Right  ventricular systolic function is normal. The right ventricular size is normal. Tricuspid regurgitation  signal is inadequate for assessing PA pressure.  3. The mitral valve is grossly normal. Trivial mitral valve regurgitation.  4. The aortic valve is tricuspid. Aortic valve regurgitation is not visualized. No aortic stenosis is present. Aortic valve mean gradient measures 2.0 mmHg.  5. The inferior vena cava is normal in size with greater than 50% respiratory variability, suggesting right atrial pressure of 3 mmHg. Comparison(s): Prior images reviewed side by side. LVEF approximately 55% with new periapical wall motion abnormality suggestive of mid ot distal LAD disease versus possible stress-induced cardiomyopathy. FINDINGS  Left Ventricle: Left ventricular ejection fraction, by estimation, is 55%. The left ventricle has normal function. The left ventricle demonstrates regional wall motion abnormalities. The left ventricular internal cavity size was normal in size. There is  moderate asymmetric left ventricular hypertrophy of the basal and septal segments. Left ventricular diastolic parameters are consistent with Grade I diastolic dysfunction (impaired relaxation).  LV Wall Scoring: The mid and distal anterior septum and entire apex are akinetic. The anterior wall, antero-lateral wall, inferior wall, posterior wall, basal anteroseptal segment, mid inferoseptal segment, and basal inferoseptal segment are normal. Right Ventricle: The right ventricular size is normal. No increase in right ventricular wall thickness. Right ventricular systolic function is normal. Tricuspid regurgitation signal is inadequate for assessing PA pressure. Left Atrium: Left atrial size was normal in size. Right Atrium: Right atrial size was normal in size. Pericardium: There is no evidence of pericardial effusion. Presence of pericardial fat pad. Mitral Valve: The mitral valve is grossly normal. Trivial mitral valve regurgitation. MV peak gradient, 3.9 mmHg. The mean mitral valve gradient is 1.0 mmHg. Tricuspid Valve: The tricuspid valve is  grossly normal. Tricuspid valve regurgitation is trivial. Aortic Valve: The aortic valve is tricuspid. There is mild aortic valve annular calcification. Aortic valve regurgitation is not visualized. No aortic stenosis is present. Aortic valve mean gradient measures 2.0 mmHg. Aortic valve peak gradient measures 4.8 mmHg. Aortic valve area, by VTI measures 3.16 cm. Pulmonic Valve: The pulmonic valve was not well visualized. Pulmonic valve regurgitation is trivial. Aorta: The aortic root is normal in size and structure. Venous: The inferior vena cava is normal in size with greater than 50% respiratory variability, suggesting right atrial pressure of 3 mmHg. IAS/Shunts: No atrial level shunt detected by color flow Doppler.  LEFT VENTRICLE PLAX 2D LVIDd:         3.49 cm  Diastology LVIDs:         2.46 cm  LV e' medial:    4.68 cm/s LV PW:         1.20 cm  LV E/e' medial:  9.7 LV IVS:        1.39 cm  LV e' lateral:   7.07 cm/s LVOT diam:     2.10 cm  LV E/e' lateral: 6.4 LV SV:         73 LV SV Index:   41 LVOT Area:     3.46 cm  RIGHT VENTRICLE RV Basal diam:  3.34 cm RV Mid diam:    3.07 cm LEFT ATRIUM             Index       RIGHT ATRIUM           Index LA diam:        3.40 cm 1.91 cm/m  RA Area:     12.20 cm LA Vol (A2C):  45.6 ml 25.55 ml/m RA Volume:   24.30 ml  13.62 ml/m LA Vol (A4C):   39.9 ml 22.36 ml/m LA Biplane Vol: 45.7 ml 25.61 ml/m  AORTIC VALVE AV Area (Vmax):    3.21 cm AV Area (Vmean):   3.30 cm AV Area (VTI):     3.16 cm AV Vmax:           109.00 cm/s AV Vmean:          74.400 cm/s AV VTI:            0.231 m AV Peak Grad:      4.8 mmHg AV Mean Grad:      2.0 mmHg LVOT Vmax:         101.00 cm/s LVOT Vmean:        70.800 cm/s LVOT VTI:          0.211 m LVOT/AV VTI ratio: 0.91  AORTA Ao Root diam: 3.20 cm MITRAL VALVE MV Area (PHT): 3.06 cm    SHUNTS MV Area VTI:   2.97 cm    Systemic VTI:  0.21 m MV Peak grad:  3.9 mmHg    Systemic Diam: 2.10 cm MV Mean grad:  1.0 mmHg MV Vmax:       0.98  m/s MV Vmean:      41.5 cm/s MV Decel Time: 248 msec MV E velocity: 45.40 cm/s MV A velocity: 96.40 cm/s MV E/A ratio:  0.47 Rozann Lesches MD Electronically signed by Rozann Lesches MD Signature Date/Time: 08/16/2021/12:35:11 PM    Final     Cardiac Studies   Echo and cath as above.  Patient Profile     75 y.o. male HTN, HLD, Type 2 DM, chest pain and history of bladder cancer with NSTEMI and severe, complex CAD.  Assessment & Plan   Principal Problem:   Chest pain Active Problems:   Hyperlipidemia associated with type 2 diabetes mellitus ([place])   Hypertension associated with diabetes ([place])   Right upper lobe pulmonary nodule   Hypoalbuminemia due to protein-calorie malnutrition (HCC)   AKI (acute kidney injury) ([place])   Hyperglycemia due to diabetes mellitus (HCC)   BPH (benign prostatic hyperplasia)   NSTEMI (non-ST elevated myocardial infarction) ([place])   Acute anterior wall MI ([place])   NSTEMI -CABG Thursday with Dr. Kipp Brood. - Per Dr. Irish Lack cath report: "Severe disease in the proximal to mid LAD, and ostial circumflex.Marland Kitchen Heavily calcified lesion at the origin of a large first diagonal. Culprit for today's presentation was an occlusion in the mid LAD, past the areas of heavy calcification. This was successfully wired and flow restored, but a balloon could not be advanced through the heavily calcific disease more proximally." - continue ASA 81 mg daily - continue metoprolol succinate 50 mg daily - continue crestor 40 mg daily - continue IV heparin. Completed tirofiban. - continue IV nitro  2. HTN - Continue Toprol-XL '50mg'$  daily and IV NTG.  - BP elevated today, will add back home amlodipine 10 mg daily.    3. HLD - FLP this AM shows LDL at 128. Crestor '40mg'$  daily. Recheck FLP and LFT's in 6-8 weeks.    4. Type 2 DM - Hgb A1c 8.0%. SSI has been ordered by the admitting team. We may want to add SGLT2I.   5. Abnormal Chest CT - CTA this admission showed a 5 mm right upper  lobe nodule with repeat noncontrast CT recommended in 3 to 6 months.   For questions or updates,  please contact [place] Please consult [email or web address] for contact info under        Signed, Elouise Munroe, MD  08/18/2021, 8:49 AM

## 2021-08-18 NOTE — Consult Note (Signed)
[address]       [place] [address]             [phone]        Robert Osborne [facility] Medical Record #[id number] Date of Birth: 08-Oct-1946  Referring: No ref. provider found Primary Care: System, Provider Not In Primary Cardiologist:James Hochrein, MD  Chief Complaint:    Chief Complaint  Patient presents with   Chest Pain    History of Present Illness:     Robert Osborne is a 75 year old gentleman that presented to [facility] emergency department on 08/15/2021 with exertional anginal symptoms.  He states that he has had a 2 to 8-month history of worsening chest pain, but on this day the pain was much worse.  He was ruled in for an NSTEMI and was transferred to [facility] for further evaluation.  He underwent a left heart cath which showed three-vessel coronary artery disease along with an occlusion to the LAD that required angioplasty.   Past Medical and Surgical History: Previous Chest Surgery: No Previous Chest Radiation: No Diabetes Mellitus: Yes.  HbA1C 8 Creatinine: 1.47  Past Medical History:  Diagnosis Date   Bladder cancer (HCC) 08/18/2013   Cancer (HCC)    papillary urethral carcinoma,low grade, non-invasive   Diabetes mellitus without complication (HCC)    Erectile dysfunction 10/24/2016   Hyperlipidemia associated with type 2 diabetes mellitus (HCC) 05/17/2013   Hypertension associated with diabetes (HCC)    Hypokalemia 11/27/2017   Hypomagnesemia 11/27/2017   Major frontotemporal neurocognitive disorder, probable, with behavioral disturbance 11/27/2017   Vitamin D deficiency 05/17/2013    Past Surgical History:  Procedure Laterality Date   COLONOSCOPY W/ POLYPECTOMY      Social History: Support: Lives with his wife  Social History   Tobacco Use  Smoking Status Former   Types: Cigarettes   Quit date: 05/17/1973   Years since quitting: 48.2  Smokeless Tobacco Never    Social History   Substance and Sexual Activity  Alcohol  Use No     Allergies  Allergen Reactions   Enalapril Cough      Current Facility-Administered Medications  Medication Dose Route Frequency Provider Last Rate Last Admin   0.9 %  sodium chloride infusion  250 mL Intravenous PRN Lance Muss S, MD       0.9% sodium chloride infusion  1 mL/kg/hr Intravenous Continuous Randall An M, PA-C 67.6 mL/hr at 08/16/21 1024 1 mL/kg/hr at 08/16/21 1024   acetaminophen (TYLENOL) tablet 650 mg  650 mg Oral Q4H PRN Corky Crafts, MD   650 mg at 08/18/21 6972   aspirin EC tablet 81 mg  81 mg Oral Daily Bobette Mo, MD   81 mg at 08/17/21 0930   Chlorhexidine Gluconate Cloth 2 % PADS 6 each  6 each Topical Daily Bobette Mo, MD   6 each at 08/17/21 0550   feeding supplement (GLUCERNA SHAKE) (GLUCERNA SHAKE) liquid 237 mL  237 mL Oral TID BM Bobette Mo, MD   237 mL at 08/17/21 1500   fentaNYL (SUBLIMAZE) injection 25 mcg  25 mcg Intravenous Q1H PRN Hammons, Kimberly B, RPH       heparin ADULT infusion 100 units/mL (25000 units/254mL)  1,400 Units/hr Intravenous Continuous Corky Crafts, MD 14 mL/hr at 08/18/21 0700 1,400 Units/hr at 08/18/21 0700   insulin aspart (novoLOG) injection 0-5 Units  0-5 Units Subcutaneous QHS Bobette Mo, MD   3  Units at 08/16/21 2112   insulin aspart (novoLOG) injection 0-9 Units  0-9 Units Subcutaneous TID WC Reubin Milan, MD   1 Units at 08/18/21 7989   MEDLINE mouth rinse  15 mL Mouth Rinse BID Jettie Booze, MD   15 mL at 08/17/21 2216   metoprolol succinate (TOPROL-XL) 24 hr tablet 50 mg  50 mg Oral Daily Reubin Milan, MD   50 mg at 08/17/21 0932   nitroGLYCERIN 50 mg in dextrose 5 % 250 mL (0.2 mg/mL) infusion  0-200 mcg/min Intravenous Titrated Reubin Milan, MD 9 mL/hr at 08/18/21 0700 30 mcg/min at 08/18/21 0700   OLANZapine (ZYPREXA) tablet 10 mg  10 mg Oral QHS Bernerd Pho M, PA-C   10 mg at 08/17/21 2216   ondansetron (ZOFRAN)  injection 4 mg  4 mg Intravenous Q6H PRN Jettie Booze, MD       rosuvastatin (CRESTOR) tablet 40 mg  40 mg Oral Daily Bernerd Pho M, PA-C   40 mg at 08/17/21 0930   sodium chloride flush (NS) 0.9 % injection 3 mL  3 mL Intravenous Q12H Bernerd Pho M, PA-C   3 mL at 08/17/21 2217   sodium chloride flush (NS) 0.9 % injection 3 mL  3 mL Intravenous Q12H Jettie Booze, MD   3 mL at 08/17/21 0929   sodium chloride flush (NS) 0.9 % injection 3 mL  3 mL Intravenous PRN Jettie Booze, MD       tamsulosin [facility]) capsule 0.4 mg  0.4 mg Oral Daily Reubin Milan, MD   0.4 mg at 08/17/21 0930    Medications Prior to Admission  Medication Sig Dispense Refill Last Dose   amLODipine (NORVASC) 10 MG tablet Take 1 tablet (10 mg total) by mouth daily. (Needs to be seen before next refill) 30 tablet 0 08/15/2021   aspirin 325 MG tablet Take 1 tablet (325 mg total) by mouth daily.   08/15/2021   insulin aspart (NOVOLOG) 100 UNIT/ML FlexPen Inject 9 Units into the skin 2 (two) times daily with a meal. 15 mL 11 08/15/2021   insulin degludec (TRESIBA FLEXTOUCH) 100 UNIT/ML SOPN FlexTouch Pen Inject 0.05 mLs (5 Units total) into the skin daily. (Patient taking differently: Inject 18 Units into the skin daily.) 1 pen 2 08/15/2021   metoprolol succinate (TOPROL-XL) 25 MG 24 hr tablet Take 50 mg by mouth daily.   08/15/2021 at 0830   nitroGLYCERIN (NITROSTAT) 0.4 MG SL tablet Place 0.4 mg under the tongue every 5 (five) minutes as needed for chest pain.   08/15/2021   OLANZapine (ZYPREXA) 10 MG tablet Take 10 mg by mouth at bedtime.   08/14/2021   potassium chloride SA (KLOR-CON) 20 MEQ tablet Take 1 tablet (20 mEq total) by mouth daily. 30 tablet 0 08/15/2021   sulfamethoxazole-trimethoprim (BACTRIM DS) 800-160 MG tablet Take 1 tablet by mouth 2 (two) times daily. Starting 9.1.22 x 7 days.   08/15/2021   tamsulosin (FLOMAX) 0.4 MG CAPS capsule Take 0.4 mg by mouth daily.   08/15/2021   blood glucose  meter kit and supplies KIT Dispense based on patient and insurance preference. Use up to twice a daily as directed. (FOR ICD-10 - type 2 DM uncontrolled E11.9) 1 each 0    blood glucose meter kit and supplies Dispense based on patient and insurance preference. Use up to four times daily as directed. (FOR ICD-10 E10.9, E11.9). 1 each 0    blood glucose meter kit  and supplies Use up to four times daily as directed. dx E11.9 1 each 1    Blood Glucose Monitoring Suppl (ONE TOUCH ULTRA 2) w/Device KIT USE TO CHECK BLOOD SUGAR UP TO 4 TIMES DAILY AS DIRECTED 1 kit 0    glucose blood test strip Use as instructed 100 each 12    Insulin Pen Needle (PEN NEEDLES) 32G X 4 MM MISC 100 each by Does not apply route 4 (four) times daily. E10.9 100 each 5    Lancets (ONETOUCH DELICA PLUS [id number]) MISC USE TO CHECK BLOOD SUGAR UP TO 4 TIMES A DAY AS DIRECTED 100 each 0     Family History  Problem Relation Age of Onset   Heart attack Maternal Grandmother    Stroke Maternal Grandfather    Heart attack Mother 83     Review of Systems:   Review of Systems  Constitutional:  Positive for malaise/fatigue.  Respiratory:  Positive for shortness of breath.   Cardiovascular:  Positive for chest pain. Negative for leg swelling.     Physical Exam: BP (!) 149/65   Pulse 60   Temp 98.3 F (36.8 C) (Oral)   Resp 19   Ht $R'5\' 7"'Wz$  (1.702 m)   Wt 65.4 kg   SpO2 95%   BMI 22.58 kg/m  Physical Exam Constitutional:      General: He is not in acute distress.    Appearance: He is normal weight.  HENT:     Head: Normocephalic and atraumatic.  Eyes:     Extraocular Movements: Extraocular movements intact.  Cardiovascular:     Rate and Rhythm: Normal rate and regular rhythm.  Pulmonary:     Effort: Pulmonary effort is normal.  Abdominal:     Palpations: Abdomen is soft.  Musculoskeletal:        General: Normal range of motion.     Cervical back: Normal range of motion.  Skin:    General: Skin is warm and  dry.  Neurological:     General: No focal deficit present.     Mental Status: He is alert and oriented to person, place, and time.      Diagnostic Studies & Laboratory data:    Left Heart Catherization: Left Anterior Descending  Ost LAD to Prox LAD lesion is 25% stenosed.  Mid LAD lesion is 90% stenosed. The lesion is severely calcified.  Dist LAD lesion is 100% stenosed.  First Diagonal Branch  1st Diag lesion is 90% stenosed. The lesion is severely calcified.  Left Circumflex  There is moderate diffuse disease throughout the vessel.  Ost Cx to Prox Cx lesion is 75% stenosed.  First Obtuse Marginal Branch  1st Mrg lesion is 90% stenosed.  Second Obtuse Marginal Branch  2nd Mrg lesion is 80% stenosed.  Right Coronary Artery  Vessel is large. The vessel exhibits minimal luminal irregularities.  Mid RCA lesion is 25% stenosed.  Right Posterior Atrioventricular Artery  RPAV lesion is 90% stenosed.  Intervention  Dist LAD lesion  Angioplasty  WIRE ASAHI PROWATER 300CM guidewire used to cross lesion. Balloon angioplasty was performed using a BALLOON SAPPHIRE 1.0X10. Unable to advance balloon to the lesion, but flow restored normal.  Post-Intervention Lesion Assessment  The intervention was successful. Pre-interventional TIMI flow is 0. Post-intervention TIMI flow is 2. No complications occurred at this lesion. Unable to advance balloon due to calcification.  There is a 95% residual stenosis post intervention.   Echo: IMPRESSIONS     1. Left ventricular ejection  fraction, by estimation, is 55%. The left  ventricle has normal function. The left ventricle demonstrates regional  wall motion abnormalities (see scoring diagram/findings for description).  There is moderate asymmetric left  ventricular hypertrophy of the basal and septal segments. Left ventricular  diastolic parameters are consistent with Grade I diastolic dysfunction  (impaired relaxation).   2. Right ventricular  systolic function is normal. The right ventricular  size is normal. Tricuspid regurgitation signal is inadequate for assessing  PA pressure.   3. The mitral valve is grossly normal. Trivial mitral valve  regurgitation.   4. The aortic valve is tricuspid. Aortic valve regurgitation is not  visualized. No aortic stenosis is present. Aortic valve mean gradient  measures 2.0 mmHg.   5. The inferior vena cava is normal in size with greater than 50%  respiratory variability, suggesting right atrial pressure of 3 mmHg.   I have independently reviewed the above radiologic studies and discussed with the patient   Recent Lab Findings: Lab Results  Component Value Date   WBC 6.4 08/18/2021   HGB 11.3 (L) 08/18/2021   HCT 34.3 (L) 08/18/2021   PLT 266 08/18/2021   GLUCOSE 180 (H) 08/17/2021   CHOL 180 08/16/2021   TRIG 89 08/16/2021   HDL 34 (L) 08/16/2021   LDLCALC 128 (H) 08/16/2021   ALT 20 08/16/2021   AST 64 (H) 08/16/2021   NA 135 08/17/2021   K 4.1 08/17/2021   CL 105 08/17/2021   CREATININE 1.47 (H) 08/17/2021   BUN 20 08/17/2021   CO2 23 08/17/2021   TSH 0.564 07/29/2019   INR 1.1 08/16/2021   HGBA1C 8.0 (H) 08/15/2021      Assessment / Plan:   75 year old male presents with an NSTEMI.  He underwent left heart catheterization which showed severe three-vessel coronary artery disease.  He underwent PCI with angioplasty to the LAD with a good result.  On review of his echocardiogram he has preserved biventricular function and no significant valvular disease.  He also developed an acute kidney injury but this appears to have plateaued over the last day or so.  We discussed the risks and benefits of surgical revascularization and he is agreeable to proceed.  He is scheduled for 08/22/2021.     I  spent 40 minutes counseling the patient face to face.   Lajuana Matte 08/18/2021 8:09 AM

## 2021-08-18 NOTE — Progress Notes (Signed)
ANTICOAGULATION CONSULT NOTE  Pharmacy Consult for heparin Indication: chest pain/ACS  Allergies  Allergen Reactions   Enalapril Cough    Patient Measurements: Height: '5\' 7"'$  (170.2 cm) Weight: 65.4 kg (144 lb 2.9 oz) IBW/kg (Calculated) : 66.1  Vital Signs: Temp: 98.5 F (36.9 C) (09/03 2300) Temp Source: Oral (09/03 2300) BP: 147/72 (09/04 0600) Pulse Rate: 64 (09/04 0600)  Labs: Recent Labs    08/15/21 1614 08/15/21 1904 08/16/21 0102 08/16/21 [id number] 08/16/21 0915 08/16/21 1650 08/17/21 0058 08/17/21 0720 08/17/21 1624 08/18/21 0133  HGB 13.2  --   --  13.2  --   --  12.0*  --   --  11.3*  HCT 41.6  --   --  40.7  --   --  36.7*  --   --  34.3*  PLT 283  --   --  282  --   --  299  --   --  266  APTT  --   --  186* 46*  --   --   --   --   --   --   LABPROT  --   --  14.5 13.8  --   --   --   --   --   --   INR  --   --  1.1 1.1  --   --   --   --   --   --   HEPARINUNFRC  --   --   --   --    < >  --   --  <0.10* 0.14* 0.38  CREATININE 1.30*  --   --  1.16  --   --  1.47*  --   --   --   TROPONINIHS 14   < > 232* 769*  --  4,567*  --   --   --   --    < > = values in this interval not displayed.     Estimated Creatinine Clearance: 38.9 mL/min (A) (by C-G formula based on SCr of 1.47 mg/dL (H)).   Medical History: Past Medical History:  Diagnosis Date   Bladder cancer ([place]) 08/18/2013   Cancer ([place])    papillary urethral carcinoma,low grade, non-invasive   Diabetes mellitus without complication ([place])    Erectile dysfunction 10/24/2016   Hyperlipidemia associated with type 2 diabetes mellitus ([place]) 05/17/2013   Hypertension associated with diabetes ([place])    Hypokalemia 11/27/2017   Hypomagnesemia 11/27/2017   Major frontotemporal neurocognitive disorder, probable, with behavioral disturbance 11/27/2017   Vitamin D deficiency 05/17/2013    Assessment: 75yo male c/o CP x several days that worsened yesterday, initial troponin negative but now rising, to begin  heparin.  Cath yesterday finding multivessel CAD - plan for cardiac surgery consult to determine CABG vs. High risk PCI. IV tirofiban for 12 hours post-cath completed and now off. Heprin restarted 9/2'@2300'$ ). Hgb 11.3, plt 266. No s/sx of bleeding.   Heparin drip 1400 uts/hr with heparin level therapeutic at 0.38.  Goal of Therapy:  Heparin level 0.3-0.7 units/ml updated now that tirofiban is off Monitor platelets by anticoagulation protocol: Yes   Plan:  Will continue heparin infusion at 1400 units/hr Monitor daily HL, CBC, and for s/sx of bleeding   Cathrine Muster, PharmD PGY2 Cardiology Pharmacy Resident Phone: [phone] 08/18/2021  6:41 AM Please check [email or web address] for unit-specific pharmacy phone numbers.

## 2021-08-19 ENCOUNTER — Inpatient Hospital Stay (HOSPITAL_COMMUNITY): Payer: Medicare HMO

## 2021-08-19 DIAGNOSIS — I214 Non-ST elevation (NSTEMI) myocardial infarction: Secondary | ICD-10-CM | POA: Diagnosis not present

## 2021-08-19 DIAGNOSIS — Z01810 Encounter for preprocedural cardiovascular examination: Secondary | ICD-10-CM

## 2021-08-19 DIAGNOSIS — E785 Hyperlipidemia, unspecified: Secondary | ICD-10-CM | POA: Diagnosis not present

## 2021-08-19 DIAGNOSIS — N179 Acute kidney failure, unspecified: Secondary | ICD-10-CM

## 2021-08-19 DIAGNOSIS — E1169 Type 2 diabetes mellitus with other specified complication: Secondary | ICD-10-CM | POA: Diagnosis not present

## 2021-08-19 LAB — CBC
HCT: 36.6 % — ABNORMAL LOW (ref 39.0–52.0)
Hemoglobin: 11.8 g/dL — ABNORMAL LOW (ref 13.0–17.0)
MCH: 27.7 pg (ref 26.0–34.0)
MCHC: 32.2 g/dL (ref 30.0–36.0)
MCV: 85.9 fL (ref 80.0–100.0)
Platelets: 312 10*3/uL (ref 150–400)
RBC: 4.26 MIL/uL (ref 4.22–5.81)
RDW: 14.2 % (ref 11.5–15.5)
WBC: 5.9 10*3/uL (ref 4.0–10.5)
nRBC: 0 % (ref 0.0–0.2)

## 2021-08-19 LAB — GLUCOSE, CAPILLARY
Glucose-Capillary: 132 mg/dL — ABNORMAL HIGH (ref 70–99)
Glucose-Capillary: 173 mg/dL — ABNORMAL HIGH (ref 70–99)
Glucose-Capillary: 258 mg/dL — ABNORMAL HIGH (ref 70–99)
Glucose-Capillary: 261 mg/dL — ABNORMAL HIGH (ref 70–99)

## 2021-08-19 LAB — BASIC METABOLIC PANEL
Anion gap: 7 (ref 5–15)
BUN: 16 mg/dL (ref 8–23)
CO2: 26 mmol/L (ref 22–32)
Calcium: 9.1 mg/dL (ref 8.9–10.3)
Chloride: 103 mmol/L (ref 98–111)
Creatinine, Ser: 1.6 mg/dL — ABNORMAL HIGH (ref 0.61–1.24)
GFR, Estimated: 44 mL/min — ABNORMAL LOW (ref >60)
Glucose, Bld: 225 mg/dL — ABNORMAL HIGH (ref 70–99)
Potassium: 4.4 mmol/L (ref 3.5–5.1)
Sodium: 136 mmol/L (ref 135–145)

## 2021-08-19 LAB — HEPARIN LEVEL (UNFRACTIONATED): Heparin Unfractionated: 0.46 IU/mL (ref 0.30–0.70)

## 2021-08-19 NOTE — Plan of Care (Signed)
  Problem: Clinical Measurements: Goal: Will remain free from infection Outcome: Progressing Goal: Respiratory complications will improve Outcome: Progressing Goal: Cardiovascular complication will be avoided Outcome: Progressing   Problem: Activity: Goal: Risk for activity intolerance will decrease Outcome: Progressing   Problem: Elimination: Goal: Will not experience complications related to bowel motility Outcome: Progressing   Problem: Pain Managment: Goal: General experience of comfort will improve Outcome: Progressing

## 2021-08-19 NOTE — Progress Notes (Signed)
Pre cabg has been completed.   Preliminary results in CV Proc.   Jinny Blossom Koji Niehoff 08/19/2021 1:13 PM

## 2021-08-19 NOTE — Progress Notes (Signed)
ANTICOAGULATION CONSULT NOTE  Pharmacy Consult for heparin Indication: chest pain/ACS  Allergies  Allergen Reactions   Enalapril Cough    Patient Measurements: Height: '5\' 7"'$  (170.2 cm) Weight: 66.7 kg (147 lb 0.8 oz) IBW/kg (Calculated) : 66.1  Vital Signs: Temp: 97.8 F (36.6 C) (09/05 0625) Temp Source: Oral (09/05 0625) BP: 137/63 (09/05 0500) Pulse Rate: 57 (09/05 0500)  Labs: Recent Labs     0000 08/16/21 1650 08/17/21 0058 08/17/21 0720 08/17/21 1624 08/18/21 0133 08/18/21 0910 08/19/21 0019  HGB   < >  --  12.0*  --   --  11.3*  --  11.8*  HCT  --   --  36.7*  --   --  34.3*  --  36.6*  PLT  --   --  299  --   --  266  --  312  HEPARINUNFRC  --   --   --    < > 0.14* 0.38  --  0.46  CREATININE  --   --  1.47*  --   --   --  1.45* 1.60*  TROPONINIHS  --  4,567*  --   --   --   --   --   --    < > = values in this interval not displayed.     Estimated Creatinine Clearance: 36.1 mL/min (A) (by C-G formula based on SCr of 1.6 mg/dL (H)).   Medical History: Past Medical History:  Diagnosis Date   Bladder cancer ([place]) 08/18/2013   Cancer ([place])    papillary urethral carcinoma,low grade, non-invasive   Diabetes mellitus without complication ([place])    Erectile dysfunction 10/24/2016   Hyperlipidemia associated with type 2 diabetes mellitus ([place]) 05/17/2013   Hypertension associated with diabetes ([place])    Hypokalemia 11/27/2017   Hypomagnesemia 11/27/2017   Major frontotemporal neurocognitive disorder, probable, with behavioral disturbance 11/27/2017   Vitamin D deficiency 05/17/2013    Assessment: 75yo male c/o CP x several days that worsened yesterday, initial troponin negative but now rising, to begin heparin.  Cath yesterday finding multivessel CAD - plan for cardiac surgery consult to determine CABG vs. High risk PCI. IV tirofiban for 12 hours post-cath completed and now off. Heprin restarted 9/2'@2300'$ . No s/sx of bleeding.  -Plan for surgical  revascularization on 08/22/2021  Heparin drip 1400 uts/hr with heparin level therapeutic at 0.46.  Goal of Therapy:  Heparin level 0.3-0.7 units/ml  Monitor platelets by anticoagulation protocol: Yes   Plan:  Will continue heparin infusion at 1400 units/hr Monitor daily HL, CBC, and for s/sx of bleeding   Donnald Garre, PharmD Clinical Pharmacist  Please check AMION for all [place] numbers After 10:00 PM, call [place] [phone]

## 2021-08-19 NOTE — Progress Notes (Signed)
Progress Note  Patient Name: Robert Osborne Date of Encounter: 08/19/2021  [facility] Cardiologist: Minus Breeding, MD   Subjective   No chest pain.  Headache improved.   Inpatient Medications    Scheduled Meds:  amLODipine  10 mg Oral Daily   aspirin EC  81 mg Oral Daily   Chlorhexidine Gluconate Cloth  6 each Topical Daily   feeding supplement (GLUCERNA SHAKE)  237 mL Oral TID BM   insulin aspart  0-5 Units Subcutaneous QHS   insulin aspart  0-9 Units Subcutaneous TID WC   mouth rinse  15 mL Mouth Rinse BID   metoprolol succinate  50 mg Oral Daily   OLANZapine  10 mg Oral QHS   rosuvastatin  40 mg Oral Daily   sodium chloride flush  3 mL Intravenous Q12H   sodium chloride flush  3 mL Intravenous Q12H   tamsulosin  0.4 mg Oral Daily   Continuous Infusions:  sodium chloride     sodium chloride 1 mL/kg/hr (08/16/21 1024)   heparin 1,400 Units/hr (08/19/21 0800)   nitroGLYCERIN 25 mcg/min (08/19/21 0800)   PRN Meds: sodium chloride, acetaminophen, fentaNYL (SUBLIMAZE) injection, ondansetron (ZOFRAN) IV, sodium chloride flush   Vital Signs    Vitals:   08/19/21 0400 08/19/21 0500 08/19/21 0625 08/19/21 0800  BP: 121/61 137/63  (!) 153/63  Pulse: 62 (!) 57  63  Resp: (!) 21 20  (!) 21  Temp:   97.8 F (36.6 C) 98.1 F (36.7 C)  TempSrc:   Oral Oral  SpO2: 90% 94%  94%  Weight:  66.7 kg    Height:        Intake/Output Summary (Last 24 hours) at 08/19/2021 1009 Last data filed at 08/19/2021 0800 Gross per 24 hour  Intake 541.49 ml  Output 2750 ml  Net -2208.51 ml   Last 3 Weights 08/19/2021 08/17/2021 08/16/2021  Weight (lbs) 147 lb 0.8 oz 144 lb 2.9 oz 149 lb 0.5 oz  Weight (kg) 66.7 kg 65.4 kg 67.6 kg      Telemetry    NSR - Personally Reviewed  ECG    9/3, NSR, anterior ST changes - Personally Reviewed  Physical Exam   GEN: No acute distress.   Neck: No JVD Cardiac: RRR, no murmurs, rubs, or gallops.  Respiratory: Clear to auscultation  bilaterally. GI: Soft, nontender, non-distended  MS: No edema; No deformity.  No hematoma at the right radial site, 2+ pulse Neuro:  Nonfocal  Psych: Normal affect   Labs    High Sensitivity Troponin:   Recent Labs  Lab 08/15/21 1904 08/15/21 2253 08/16/21 0102 08/16/21 0614 08/16/21 1650  TROPONINIHS 28* 108* 232* 769* 4,567*      Chemistry Recent Labs  Lab 08/15/21 1614 08/16/21 0614 08/17/21 0058 08/18/21 0910 08/19/21 0019  NA 134* 139 135 135 136  K 4.2 3.7 4.1 5.1 4.4  CL 107 107 105 102 103  CO2 '22 24 23 23 26  '$ GLUCOSE 205* 104* 180* 218* 225*  BUN 24* '20 20 18 16  '$ CREATININE 1.30* 1.16 1.47* 1.45* 1.60*  CALCIUM 8.2* 8.4* 8.3* 8.6* 9.1  PROT 7.1 6.4*  --   --   --   ALBUMIN 3.3* 3.0*  --   --   --   AST 23 64*  --   --   --   ALT 17 20  --   --   --   ALKPHOS 93 83  --   --   --  BILITOT 0.4 0.5  --   --   --   GFRNONAA 57* >60 49* 50* 44*  ANIONGAP '5 8 7 10 7     '$ Hematology Recent Labs  Lab 08/17/21 0058 08/18/21 0133 08/19/21 0019  WBC 8.6 6.4 5.9  RBC 4.36 4.04* 4.26  HGB 12.0* 11.3* 11.8*  HCT 36.7* 34.3* 36.6*  MCV 84.2 84.9 85.9  MCH 27.5 28.0 27.7  MCHC 32.7 32.9 32.2  RDW 14.2 14.1 14.2  PLT 299 266 312    BNP Recent Labs  Lab 08/15/21 1614  BNP 31.0     DDimer No results for input(s): DDIMER in the last 168 hours.   Radiology    No results found.  Cardiac Studies   Echo showed apical hypokinesis, cath showed severe calcific disease.  Patient Profile     75 y.o. male with NSTEMI, anterior distribution  Assessment & Plan    S/p PTCA of LAD to restore flow.  Plan for CABG on 9/8.  COntinue IV heparin.  Patient has been having sx for some time and has decreased his activity to compensate.  ASpirin and high dose rosuvastatin to continue.  IV NTG as well.  Headache resolved.   Hyperlipidemia: LDL above target on 9/2 in the setting of MI.  May need more intensive therapy going forward.   DM: SSI.   AKI: stable.  Should improve with time. Hopefully reaching a plateau. Cr 1.6 today from 1.45 yesterday  For questions or updates, please contact [place] Please consult [email or web address] for contact info under        Signed, Larae Grooms, MD  08/19/2021, 10:09 AM

## 2021-08-20 ENCOUNTER — Encounter (HOSPITAL_COMMUNITY): Payer: Self-pay | Admitting: Interventional Cardiology

## 2021-08-20 DIAGNOSIS — E1169 Type 2 diabetes mellitus with other specified complication: Secondary | ICD-10-CM | POA: Diagnosis not present

## 2021-08-20 DIAGNOSIS — E1159 Type 2 diabetes mellitus with other circulatory complications: Secondary | ICD-10-CM

## 2021-08-20 DIAGNOSIS — E1165 Type 2 diabetes mellitus with hyperglycemia: Secondary | ICD-10-CM

## 2021-08-20 DIAGNOSIS — I152 Hypertension secondary to endocrine disorders: Secondary | ICD-10-CM

## 2021-08-20 DIAGNOSIS — I214 Non-ST elevation (NSTEMI) myocardial infarction: Secondary | ICD-10-CM | POA: Diagnosis not present

## 2021-08-20 DIAGNOSIS — N179 Acute kidney failure, unspecified: Secondary | ICD-10-CM | POA: Diagnosis not present

## 2021-08-20 LAB — CBC
HCT: 34.9 % — ABNORMAL LOW (ref 39.0–52.0)
Hemoglobin: 11.4 g/dL — ABNORMAL LOW (ref 13.0–17.0)
MCH: 27.9 pg (ref 26.0–34.0)
MCHC: 32.7 g/dL (ref 30.0–36.0)
MCV: 85.3 fL (ref 80.0–100.0)
Platelets: 300 10*3/uL (ref 150–400)
RBC: 4.09 MIL/uL — ABNORMAL LOW (ref 4.22–5.81)
RDW: 14.2 % (ref 11.5–15.5)
WBC: 6.1 10*3/uL (ref 4.0–10.5)
nRBC: 0 % (ref 0.0–0.2)

## 2021-08-20 LAB — GLUCOSE, CAPILLARY
Glucose-Capillary: 156 mg/dL — ABNORMAL HIGH (ref 70–99)
Glucose-Capillary: 168 mg/dL — ABNORMAL HIGH (ref 70–99)
Glucose-Capillary: 155 mg/dL — ABNORMAL HIGH (ref 70–99)
Glucose-Capillary: 307 mg/dL — ABNORMAL HIGH (ref 70–99)
Glucose-Capillary: 128 mg/dL — ABNORMAL HIGH (ref 70–99)

## 2021-08-20 LAB — BASIC METABOLIC PANEL
Anion gap: 7 (ref 5–15)
BUN: 17 mg/dL (ref 8–23)
CO2: 24 mmol/L (ref 22–32)
Calcium: 8.8 mg/dL — ABNORMAL LOW (ref 8.9–10.3)
Chloride: 104 mmol/L (ref 98–111)
Creatinine, Ser: 1.26 mg/dL — ABNORMAL HIGH (ref 0.61–1.24)
GFR, Estimated: 59 mL/min — ABNORMAL LOW (ref >60)
Glucose, Bld: 197 mg/dL — ABNORMAL HIGH (ref 70–99)
Potassium: 4.4 mmol/L (ref 3.5–5.1)
Sodium: 135 mmol/L (ref 135–145)

## 2021-08-20 LAB — HEPARIN LEVEL (UNFRACTIONATED): Heparin Unfractionated: 0.48 IU/mL (ref 0.30–0.70)

## 2021-08-20 MED ORDER — INSULIN ASPART 100 UNIT/ML IJ SOLN: 0.0000 [IU] | Freq: Three times a day (TID) | INTRAMUSCULAR | Status: DC

## 2021-08-20 MED ORDER — INSULIN ASPART 100 UNIT/ML IJ SOLN
0.0000 [IU] | Freq: Three times a day (TID) | INTRAMUSCULAR | Status: DC
  Administered 2021-08-20: 3 [IU] via SUBCUTANEOUS
  Administered 2021-08-20: 11 [IU] via SUBCUTANEOUS
  Administered 2021-08-20: 3 [IU] via SUBCUTANEOUS
  Administered 2021-08-21: 2 [IU] via SUBCUTANEOUS
  Administered 2021-08-21: 5 [IU] via SUBCUTANEOUS
  Administered 2021-08-21: 2 [IU] via SUBCUTANEOUS

## 2021-08-20 NOTE — Plan of Care (Signed)
  Problem: Pain Managment: Goal: General experience of comfort will improve Outcome: Progressing   Problem: Clinical Measurements: Goal: Diagnostic test results will improve Outcome: Progressing   Problem: Elimination: Goal: Will not experience complications related to bowel motility Outcome: Progressing   Problem: Safety: Goal: Ability to remain free from injury will improve Outcome: Progressing

## 2021-08-20 NOTE — Progress Notes (Signed)
Progress Note  Patient Name: Robert Osborne Date of Encounter: 08/20/2021  [facility] Cardiologist: Minus Breeding, MD   Subjective   Patient reports chest pain yesterday afternoon. Resolved. None since. No dyspnea. Feels well.   Inpatient Medications    Scheduled Meds:  amLODipine  10 mg Oral Daily   aspirin EC  81 mg Oral Daily   Chlorhexidine Gluconate Cloth  6 each Topical Daily   feeding supplement (GLUCERNA SHAKE)  237 mL Oral TID BM   insulin aspart  0-5 Units Subcutaneous QHS   insulin aspart  0-9 Units Subcutaneous TID WC   mouth rinse  15 mL Mouth Rinse BID   metoprolol succinate  50 mg Oral Daily   OLANZapine  10 mg Oral QHS   rosuvastatin  40 mg Oral Daily   sodium chloride flush  3 mL Intravenous Q12H   sodium chloride flush  3 mL Intravenous Q12H   tamsulosin  0.4 mg Oral Daily   Continuous Infusions:  sodium chloride     sodium chloride 1 mL/kg/hr (08/16/21 1024)   heparin 1,400 Units/hr (08/20/21 0500)   nitroGLYCERIN 30 mcg/min (08/20/21 0500)   PRN Meds: sodium chloride, acetaminophen, fentaNYL (SUBLIMAZE) injection, ondansetron (ZOFRAN) IV, sodium chloride flush   Vital Signs    Vitals:   08/20/21 0333 08/20/21 0400 08/20/21 0500 08/20/21 0600  BP:  118/83 122/73 130/77  Pulse: (!) 56 (!) 58 (!) 57 (!) 57  Resp: 18 (!) 25 18 (!) 23  Temp:      TempSrc:      SpO2: 95% 95% 95% 94%  Weight:    67.1 kg  Height:        Intake/Output Summary (Last 24 hours) at 08/20/2021 0730 Last data filed at 08/20/2021 0500 Gross per 24 hour  Intake 638.99 ml  Output 2300 ml  Net -1661.01 ml    Last 3 Weights 08/20/2021 08/19/2021 08/17/2021  Weight (lbs) 147 lb 14.9 oz 147 lb 0.8 oz 144 lb 2.9 oz  Weight (kg) 67.1 kg 66.7 kg 65.4 kg      Telemetry    NSR, 11 beat  run SVT - Personally Reviewed  ECG    9/3, NSR, anterior ST changes - stable- Personally Reviewed  Physical Exam   GEN: No acute distress.   Neck: No JVD Cardiac: RRR, no murmurs,  rubs, or gallops.  Respiratory: Clear to auscultation bilaterally. GI: Soft, nontender, non-distended  MS: No edema; No deformity.  No hematoma at the right radial site, 2+ pulse Neuro:  Nonfocal  Psych: Normal affect   Labs    High Sensitivity Troponin:   Recent Labs  Lab 08/15/21 1904 08/15/21 2253 08/16/21 0102 08/16/21 0614 08/16/21 1650  TROPONINIHS 28* 108* 232* 769* 4,567*       Chemistry Recent Labs  Lab 08/15/21 1614 08/16/21 0614 08/17/21 0058 08/18/21 0910 08/19/21 0019 08/20/21 0035  NA 134* 139   < > 135 136 135  K 4.2 3.7   < > 5.1 4.4 4.4  CL 107 107   < > 102 103 104  CO2 22 24   < > '23 26 24  '$ GLUCOSE 205* 104*   < > 218* 225* 197*  BUN 24* 20   < > '18 16 17  '$ CREATININE 1.30* 1.16   < > 1.45* 1.60* 1.26*  CALCIUM 8.2* 8.4*   < > 8.6* 9.1 8.8*  PROT 7.1 6.4*  --   --   --   --  ALBUMIN 3.3* 3.0*  --   --   --   --   AST 23 64*  --   --   --   --   ALT 17 20  --   --   --   --   ALKPHOS 93 83  --   --   --   --   BILITOT 0.4 0.5  --   --   --   --   GFRNONAA 57* >60   < > 50* 44* 59*  ANIONGAP 5 8   < > '10 7 7   '$ < > = values in this interval not displayed.      Hematology Recent Labs  Lab 08/18/21 0133 08/19/21 0019 08/20/21 0035  WBC 6.4 5.9 6.1  RBC 4.04* 4.26 4.09*  HGB 11.3* 11.8* 11.4*  HCT 34.3* 36.6* 34.9*  MCV 84.9 85.9 85.3  MCH 28.0 27.7 27.9  MCHC 32.9 32.2 32.7  RDW 14.1 14.2 14.2  PLT 266 312 300     BNP Recent Labs  Lab 08/15/21 1614  BNP 31.0      DDimer No results for input(s): DDIMER in the last 168 hours.   Radiology    VAS US DOPPLER PRE CABG  Result Date: 08/19/2021 PREOPERATIVE VASCULAR EVALUATION Patient Name:  Robert Osborne  Date of Exam:   08/19/2021 Medical Rec #: [id number]        Accession #:    [id number] Date of Birth: 28-Nov-1946        Patient Gender: M Patient Age:   14 years Exam Location:  [facility] Procedure:      VAS US DOPPLER PRE CABG Referring Phys: HARRELL LIGHTFOOT  --------------------------------------------------------------------------------  Indications:      Pre-CABG. Risk Factors:     Hypertension, Diabetes. Comparison Study: no prior Performing Technologist: Archie Patten RVS  Examination Guidelines: A complete evaluation includes B-mode imaging, spectral Doppler, color Doppler, and power Doppler as needed of all accessible portions of each vessel. Bilateral testing is considered an integral part of a complete examination. Limited examinations for reoccurring indications may be performed as noted.  Right Carotid Findings: +----------+--------+--------+--------+------------+--------+           PSV cm/sEDV cm/sStenosisDescribe    Comments +----------+--------+--------+--------+------------+--------+ CCA Prox  94      13              heterogenous         +----------+--------+--------+--------+------------+--------+ CCA Distal85      13              heterogenous         +----------+--------+--------+--------+------------+--------+ ICA Prox  55      18      1-39%   heterogenous         +----------+--------+--------+--------+------------+--------+ ICA Distal50      13                                   +----------+--------+--------+--------+------------+--------+ ECA       114                                          +----------+--------+--------+--------+------------+--------+ +----------+--------+-------+--------+------------+           PSV cm/sEDV cmsDescribeArm Pressure +----------+--------+-------+--------+------------+ Subclavian71                                  +----------+--------+-------+--------+------------+ +---------+--------+--+--------+--+---------+  VertebralPSV cm/s39EDV cm/s16Antegrade +---------+--------+--+--------+--+---------+ Left Carotid Findings: +----------+--------+--------+--------+------------+--------+           PSV cm/sEDV cm/sStenosisDescribe    Comments  +----------+--------+--------+--------+------------+--------+ CCA Prox  84      9               heterogenous         +----------+--------+--------+--------+------------+--------+ CCA Distal83      15              heterogenous         +----------+--------+--------+--------+------------+--------+ ICA Prox  51      15      1-39%   heterogenous         +----------+--------+--------+--------+------------+--------+ ICA Distal63      13                                   +----------+--------+--------+--------+------------+--------+ ECA       99      7                                    +----------+--------+--------+--------+------------+--------+ +----------+--------+--------+--------+------------+ SubclavianPSV cm/sEDV cm/sDescribeArm Pressure +----------+--------+--------+--------+------------+           97                                   +----------+--------+--------+--------+------------+ +---------+--------+--+--------+-+---------+ VertebralPSV cm/s47EDV cm/s8Antegrade +---------+--------+--+--------+-+---------+  ABI Findings: +--------+------------------+-----+---------+--------+ Right   Rt Pressure (mmHg)IndexWaveform Comment  +--------+------------------+-----+---------+--------+ [id number]                    triphasic         +--------+------------------+-----+---------+--------+ PTA     194               1.36 triphasic         +--------+------------------+-----+---------+--------+ DP      156               1.09 triphasic         +--------+------------------+-----+---------+--------+ +--------+------------------+-----+---------+-----------------------+ Left    Lt Pressure (mmHg)IndexWaveform Comment                 +--------+------------------+-----+---------+-----------------------+ Brachial                       triphasicunable to tolerate cuff +--------+------------------+-----+---------+-----------------------+ PTA      204               1.43 triphasic                        +--------+------------------+-----+---------+-----------------------+ DP      154               1.08 triphasic                        +--------+------------------+-----+---------+-----------------------+ +-------+---------------+----------------+ ABI/TBIToday's ABI/TBIPrevious ABI/TBI +-------+---------------+----------------+ Right  1.36                            +-------+---------------+----------------+ Left   1.43                            +-------+---------------+----------------+  Right Doppler Findings: +--------+--------+-----+---------+--------+ Site  PressureIndexDoppler  Comments +--------+--------+-----+---------+--------+ [id number]          triphasic         +--------+--------+-----+---------+--------+ Radial               triphasic         +--------+--------+-----+---------+--------+ Ulnar                triphasic         +--------+--------+-----+---------+--------+  Left Doppler Findings: +--------+--------+-----+---------+-----------------------+ Site    PressureIndexDoppler  Comments                +--------+--------+-----+---------+-----------------------+ Brachial             triphasicunable to tolerate cuff +--------+--------+-----+---------+-----------------------+ Radial               triphasic                        +--------+--------+-----+---------+-----------------------+ Ulnar                triphasic                        +--------+--------+-----+---------+-----------------------+  Summary: Right Carotid: Velocities in the right ICA are consistent with a 1-39% stenosis. Left Carotid: Velocities in the left ICA are consistent with a 1-39% stenosis. Vertebrals: Bilateral vertebral arteries demonstrate antegrade flow. Right ABI: Resting right ankle-brachial index indicates noncompressible right lower extremity arteries. Left ABI: Resting left ankle-brachial  index indicates noncompressible left lower extremity arteries. Right Upper Extremity: Doppler waveforms decrease 50% with right radial compression. Doppler waveforms remain within normal limits with right ulnar compression. Left Upper Extremity: Doppler waveforms remain within normal limits with left radial compression. Doppler waveforms remain within normal limits with left ulnar compression.  Electronically signed by Deitra Mayo MD on 08/19/2021 at 3:17:37 PM.    Final     Cardiac Studies   Procedures  CORONARY BALLOON ANGIOPLASTY  LEFT HEART CATH AND CORONARY ANGIOGRAPHY   Conclusion      Ost Cx to Prox Cx lesion is 75% stenosed.   2nd Mrg lesion is 80% stenosed.   1st Mrg lesion is 90% stenosed.   Ost LAD to Prox LAD lesion is 25% stenosed.   1st Diag lesion is 90% stenosed.   Mid LAD lesion is 90% stenosed.   Dist LAD lesion is 100% stenosed.  Flow restored with a wire.  Unable to advance balloon.   Mid RCA lesion is 25% stenosed.   RPAV lesion is 90% stenosed.   Balloon angioplasty was performed using a BALLOON SAPPHIRE 1.0X10.   Post intervention, there is a 95% residual stenosis.   There is mild to moderate left ventricular systolic dysfunction.   LV end diastolic pressure is normal.   The left ventricular ejection fraction is 45-50% by visual estimate.   There is no aortic valve stenosis.   Severe disease in the proximal to mid LAD, and ostial circumflex.Marland Kitchen  Heavily calcified lesion at the origin of a large first diagonal.  Culprit for today's presentation was an occlusion in the mid LAD, past the areas of heavy calcification.  This was successfully wired and flow restored, but a balloon could not be advanced through the heavily calcific disease more proximally.     Will obtain cardiac surgery consult.  If surgery not an option, will have to see if there is an appropriate high risk PCI strategy.   Continue IV heparin after 8 hours post sheath removal.  IV tirofiban  started in the Cath Lab which will continue for 12 hours.   Severe tortuosity of the right subclavian requiring 75 cm destination sheath. Coronary Diagrams  Diagnostic Dominance: Right Intervention  Implants      Echo: IMPRESSIONS     1. Left ventricular ejection fraction, by estimation, is 55%. The left  ventricle has normal function. The left ventricle demonstrates regional  wall motion abnormalities (see scoring diagram/findings for description).  There is moderate asymmetric left  ventricular hypertrophy of the basal and septal segments. Left ventricular  diastolic parameters are consistent with Grade I diastolic dysfunction  (impaired relaxation).   2. Right ventricular systolic function is normal. The right ventricular  size is normal. Tricuspid regurgitation signal is inadequate for assessing  PA pressure.   3. The mitral valve is grossly normal. Trivial mitral valve  regurgitation.   4. The aortic valve is tricuspid. Aortic valve regurgitation is not  visualized. No aortic stenosis is present. Aortic valve mean gradient  measures 2.0 mmHg.   5. The inferior vena cava is normal in size with greater than 50%  respiratory variability, suggesting right atrial pressure of 3 mmHg.   Comparison(s): Prior images reviewed side by side. LVEF approximately 55%  with new periapical wall motion abnormality suggestive of mid ot distal  LAD disease versus possible stress-induced cardiomyopathy.   Patient Profile     75 y.o. male with NSTEMI, anterior distribution  Assessment & Plan    CAD S/p PTCA of LAD distally to restore flow.  Severe diffuse 3 vessel CAD. Plan for CABG on 9/8.  COntinue IV heparin.  Patient did have recurrent chest pain yesterday -now on IV Ntg. Also on amlodipine and Toprol XL.   ASpirin and high dose rosuvastatin to continue.  IV NTG as well.    2. Hyperlipidemia: LDL above target on 9/2 in the setting of MI.  On high dose Crestor.   3. DM: SSI.   4.  AKI: improved creatinine down to 1.26  For questions or updates, please contact [place] Please consult [email or web address] for contact info under        Signed, Gene Colee [place]ique, MD  08/20/2021, 7:30 AM

## 2021-08-20 NOTE — Progress Notes (Signed)
Will hold ambulation due to occasional CP on NTG. Pt just got up to recliner. Discussed IS (1500 ml), sternal precautions, mobility post op and d/c planning. Pt receptive. Wife will be with him postop. [id number] Yves Dill CES, ACSM 11:10 AM 08/20/2021

## 2021-08-20 NOTE — Progress Notes (Signed)
Inpatient Diabetes Program Recommendations  AACE/ADA: New Consensus Statement on Inpatient Glycemic Control (2015)  Target Ranges:  Prepandial:   less than 140 mg/dL      Peak postprandial:   less than 180 mg/dL (1-2 hours)      Critically ill patients:  140 - 180 mg/dL   Lab Results  Component Value Date   GLUCAP 168 (H) 08/20/2021   HGBA1C 8.0 (H) 08/15/2021    Review of Glycemic Control Results for KEONTE, SALZER (MRN [id number]) as of 08/20/2021 08:47  Ref. Range 08/19/2021 06:25 08/19/2021 10:52 08/19/2021 15:57 08/19/2021 21:13 08/20/2021 06:09 08/20/2021 07:33  Glucose-Capillary Latest Ref Range: 70 - 99 mg/dL 132 (H) 173 (H) 258 (H) 261 (H) 156 (H) 168 (H)   Diabetes history: DM 2 Outpatient Diabetes medications: Tresiba 18 units, Novolog 9 units tid with meals Current orders for Inpatient glycemic control:  Novolog 0-15 units tid + hs  Glucerna tid between meals A1c 8% on 9/1  Inpatient Diabetes Program Recommendations:    Pt on meal coverage insulin at home. Glucose trends increase after meal intake.  -  Consider Novolog 4 units tid meal coverage if pt eating >50% of meals  Thanks,  Tama Headings RN, MSN, BC-ADM Inpatient Diabetes Coordinator Team Pager [phone] (8a-5p)

## 2021-08-20 NOTE — Progress Notes (Signed)
Patient with one episode of chest pain after talking on the phone with his son. Nitroglycerin gtt increased and fentanyl given with resolution of the chest pain.

## 2021-08-20 NOTE — Progress Notes (Signed)
ANTICOAGULATION CONSULT NOTE  Pharmacy Consult for heparin Indication: chest pain/ACS  Allergies  Allergen Reactions   Enalapril Cough    Patient Measurements: Height: '5\' 7"'$  (170.2 cm) Weight: 67.1 kg (147 lb 14.9 oz) IBW/kg (Calculated) : 66.1  Vital Signs: Temp: 98.4 F (36.9 C) (09/05 2000) Temp Source: Oral (09/05 2000) BP: 130/77 (09/06 0600) Pulse Rate: 57 (09/06 0600)  Labs: Recent Labs    08/18/21 0133 08/18/21 0910 08/19/21 0019 08/20/21 0035  HGB 11.3*  --  11.8* 11.4*  HCT 34.3*  --  36.6* 34.9*  PLT 266  --  312 300  HEPARINUNFRC 0.38  --  0.46 0.48  CREATININE  --  1.45* 1.60* 1.26*     Estimated Creatinine Clearance: 45.9 mL/min (A) (by C-G formula based on SCr of 1.26 mg/dL (H)).   Medical History: Past Medical History:  Diagnosis Date   Bladder cancer ([place]) 08/18/2013   Cancer ([place])    papillary urethral carcinoma,low grade, non-invasive   Diabetes mellitus without complication ([place])    Erectile dysfunction 10/24/2016   Hyperlipidemia associated with type 2 diabetes mellitus ([place]) 05/17/2013   Hypertension associated with diabetes ([place])    Hypokalemia 11/27/2017   Hypomagnesemia 11/27/2017   Major frontotemporal neurocognitive disorder, probable, with behavioral disturbance 11/27/2017   Vitamin D deficiency 05/17/2013    Assessment: 75yo male c/o CP x several days that worsened yesterday, initial troponin negative but now rising, to begin heparin.  Cath yesterday finding multivessel CAD - plan for cardiac surgery consult to determine CABG vs. High risk PCI. IV tirofiban for 12 hours post-cath completed and now off. Heprin restarted 9/2'@2300'$ . No s/sx of bleeding. CBC stable. -Plan for surgical revascularization on 08/22/2021  Heparin drip 1400 uts/hr with heparin level therapeutic at 0.48.  Goal of Therapy:  Heparin level 0.3-0.7 units/ml  Monitor platelets by anticoagulation protocol: Yes   Plan:  Will continue heparin infusion at 1400  units/hr Monitor daily HL, CBC, and for s/sx of bleeding   Thank you for allowing pharmacy to participate in this patient's care.  Reatha Harps, PharmD PGY1 Pharmacy Resident 08/20/2021 7:32 AM Check [email or web address] for unit specific pharmacy number

## 2021-08-21 DIAGNOSIS — I2109 ST elevation (STEMI) myocardial infarction involving other coronary artery of anterior wall: Secondary | ICD-10-CM | POA: Diagnosis not present

## 2021-08-21 DIAGNOSIS — E1159 Type 2 diabetes mellitus with other circulatory complications: Secondary | ICD-10-CM | POA: Diagnosis not present

## 2021-08-21 DIAGNOSIS — E785 Hyperlipidemia, unspecified: Secondary | ICD-10-CM | POA: Diagnosis not present

## 2021-08-21 DIAGNOSIS — E1169 Type 2 diabetes mellitus with other specified complication: Secondary | ICD-10-CM | POA: Diagnosis not present

## 2021-08-21 LAB — GLUCOSE, CAPILLARY
Glucose-Capillary: 148 mg/dL — ABNORMAL HIGH (ref 70–99)
Glucose-Capillary: 220 mg/dL — ABNORMAL HIGH (ref 70–99)
Glucose-Capillary: 213 mg/dL — ABNORMAL HIGH (ref 70–99)

## 2021-08-21 LAB — HEPARIN LEVEL (UNFRACTIONATED): Heparin Unfractionated: 0.56 IU/mL (ref 0.30–0.70)

## 2021-08-21 LAB — CBC
HCT: 34.5 % — ABNORMAL LOW (ref 39.0–52.0)
Hemoglobin: 11.3 g/dL — ABNORMAL LOW (ref 13.0–17.0)
MCH: 27.8 pg (ref 26.0–34.0)
MCHC: 32.8 g/dL (ref 30.0–36.0)
MCV: 85 fL (ref 80.0–100.0)
Platelets: 294 10*3/uL (ref 150–400)
RBC: 4.06 MIL/uL — ABNORMAL LOW (ref 4.22–5.81)
RDW: 14.2 % (ref 11.5–15.5)
WBC: 5.7 10*3/uL (ref 4.0–10.5)
nRBC: 0 % (ref 0.0–0.2)

## 2021-08-21 LAB — BASIC METABOLIC PANEL
Anion gap: 6 (ref 5–15)
BUN: 20 mg/dL (ref 8–23)
CO2: 25 mmol/L (ref 22–32)
Calcium: 9 mg/dL (ref 8.9–10.3)
Chloride: 105 mmol/L (ref 98–111)
Creatinine, Ser: 1.29 mg/dL — ABNORMAL HIGH (ref 0.61–1.24)
GFR, Estimated: 57 mL/min — ABNORMAL LOW (ref >60)
Glucose, Bld: 196 mg/dL — ABNORMAL HIGH (ref 70–99)
Potassium: 4 mmol/L (ref 3.5–5.1)
Sodium: 136 mmol/L (ref 135–145)

## 2021-08-21 LAB — ABO/RH: ABO/RH(D): O POS

## 2021-08-21 LAB — PREPARE RBC (CROSSMATCH)

## 2021-08-21 MED ORDER — NITROGLYCERIN IN D5W 200-5 MCG/ML-% IV SOLN
2.0000 ug/min | INTRAVENOUS | Status: AC
  Administered 2021-08-22: 30 ug/min via INTRAVENOUS
  Filled 2021-08-21: qty 250

## 2021-08-21 MED ORDER — NOREPINEPHRINE 4 MG/250ML-% IV SOLN
0.0000 ug/min | INTRAVENOUS | Status: DC
  Filled 2021-08-21: qty 250

## 2021-08-21 MED ORDER — INSULIN REGULAR(HUMAN) IN NACL 100-0.9 UT/100ML-% IV SOLN
INTRAVENOUS | Status: AC
  Administered 2021-08-22: 3.2 [IU]/h via INTRAVENOUS
  Filled 2021-08-21: qty 100

## 2021-08-21 MED ORDER — CHLORHEXIDINE GLUCONATE CLOTH 2 % EX PADS
6.0000 | MEDICATED_PAD | Freq: Once | CUTANEOUS | Status: AC
  Administered 2021-08-21: 6 via TOPICAL

## 2021-08-21 MED ORDER — CHLORHEXIDINE GLUCONATE CLOTH 2 % EX PADS
6.0000 | MEDICATED_PAD | Freq: Once | CUTANEOUS | Status: AC
  Administered 2021-08-22: 6 via TOPICAL

## 2021-08-21 MED ORDER — EPINEPHRINE HCL 5 MG/250ML IV SOLN IN NS
0.0000 ug/min | INTRAVENOUS | Status: DC
  Filled 2021-08-21: qty 250

## 2021-08-21 MED ORDER — PHENYLEPHRINE HCL-NACL 20-0.9 MG/250ML-% IV SOLN
30.0000 ug/min | INTRAVENOUS | Status: AC
  Administered 2021-08-22: 20 ug/min via INTRAVENOUS
  Filled 2021-08-21: qty 250

## 2021-08-21 MED ORDER — INSULIN ASPART 100 UNIT/ML IJ SOLN
4.0000 [IU] | Freq: Three times a day (TID) | INTRAMUSCULAR | Status: DC
  Administered 2021-08-21 (×3): 4 [IU] via SUBCUTANEOUS

## 2021-08-21 MED ORDER — MANNITOL 20 % IV SOLN
INTRAVENOUS | Status: DC
Start: 2021-08-22 — End: 2021-08-22
  Filled 2021-08-21: qty 13

## 2021-08-21 MED ORDER — TRANEXAMIC ACID (OHS) BOLUS VIA INFUSION
15.0000 mg/kg | INTRAVENOUS | Status: AC
  Administered 2021-08-22: 1002 mg via INTRAVENOUS
  Filled 2021-08-21: qty 1002

## 2021-08-21 MED ORDER — CEFAZOLIN SODIUM-DEXTROSE 2-4 GM/100ML-% IV SOLN
2.0000 g | INTRAVENOUS | Status: AC
  Administered 2021-08-22 (×2): 2 g via INTRAVENOUS
  Filled 2021-08-21: qty 100

## 2021-08-21 MED ORDER — TRANEXAMIC ACID (OHS) PUMP PRIME SOLUTION
2.0000 mg/kg | INTRAVENOUS | Status: DC
  Filled 2021-08-21: qty 1.34

## 2021-08-21 MED ORDER — PLASMA-LYTE A IV SOLN
INTRAVENOUS | Status: DC
  Filled 2021-08-21: qty 5

## 2021-08-21 MED ORDER — SODIUM CHLORIDE 0.9 % IV SOLN
INTRAVENOUS | Status: DC
  Filled 2021-08-21: qty 30

## 2021-08-21 MED ORDER — BISACODYL 5 MG PO TBEC
5.0000 mg | DELAYED_RELEASE_TABLET | Freq: Once | ORAL | Status: AC
  Administered 2021-08-21: 5 mg via ORAL
  Filled 2021-08-21: qty 1

## 2021-08-21 MED ORDER — CEFAZOLIN SODIUM-DEXTROSE 2-4 GM/100ML-% IV SOLN
2.0000 g | INTRAVENOUS | Status: DC
  Filled 2021-08-21: qty 100

## 2021-08-21 MED ORDER — CHLORHEXIDINE GLUCONATE 0.12 % MT SOLN
15.0000 mL | Freq: Once | OROMUCOSAL | Status: AC
  Administered 2021-08-22: 15 mL via OROMUCOSAL
  Filled 2021-08-21: qty 15

## 2021-08-21 MED ORDER — DEXMEDETOMIDINE HCL IN NACL 400 MCG/100ML IV SOLN
0.1000 ug/kg/h | INTRAVENOUS | Status: AC
  Administered 2021-08-22: .3 ug/kg/h via INTRAVENOUS
  Filled 2021-08-21: qty 100

## 2021-08-21 MED ORDER — TRANEXAMIC ACID 1000 MG/10ML IV SOLN
1.5000 mg/kg/h | INTRAVENOUS | Status: AC
  Administered 2021-08-22: 1.5 mg/kg/h via INTRAVENOUS
  Filled 2021-08-21: qty 15

## 2021-08-21 MED ORDER — METOPROLOL TARTRATE 12.5 MG HALF TABLET
12.5000 mg | ORAL_TABLET | Freq: Once | ORAL | Status: AC
  Administered 2021-08-22: 12.5 mg via ORAL
  Filled 2021-08-21: qty 1

## 2021-08-21 MED ORDER — POTASSIUM CHLORIDE 2 MEQ/ML IV SOLN
80.0000 meq | INTRAVENOUS | Status: DC
  Filled 2021-08-21: qty 40

## 2021-08-21 MED ORDER — TEMAZEPAM 15 MG PO CAPS
15.0000 mg | ORAL_CAPSULE | Freq: Once | ORAL | Status: AC | PRN
  Administered 2021-08-21: 15 mg via ORAL
  Filled 2021-08-21: qty 1

## 2021-08-21 MED ORDER — MILRINONE LACTATE IN DEXTROSE 20-5 MG/100ML-% IV SOLN
0.3000 ug/kg/min | INTRAVENOUS | Status: DC
  Filled 2021-08-21: qty 100

## 2021-08-21 MED ORDER — VANCOMYCIN HCL 1250 MG/250ML IV SOLN
1250.0000 mg | INTRAVENOUS | Status: AC
  Administered 2021-08-22: 1250 mg via INTRAVENOUS
  Filled 2021-08-21: qty 250

## 2021-08-21 NOTE — Progress Notes (Signed)
Patient with episode of chest pain after talking with daughter on the phone. Patient states that he "got excited" and it made his chest hurt, requesting Tylenol. Tylenol given and nitroglycerin gtt increased. Patient now resting comfortably.

## 2021-08-21 NOTE — Progress Notes (Signed)
Progress Note  Patient Name: Robert Osborne Date of Encounter: 08/21/2021  [facility] Cardiologist: Minus Breeding, MD   Subjective   Patient reports chest pain yesterday when upset talking with his son. Resolved. None since. No dyspnea. Feels well.   Inpatient Medications    Scheduled Meds:  amLODipine  10 mg Oral Daily   aspirin EC  81 mg Oral Daily   Chlorhexidine Gluconate Cloth  6 each Topical Daily   feeding supplement (GLUCERNA SHAKE)  237 mL Oral TID BM   insulin aspart  0-15 Units Subcutaneous TID WC   insulin aspart  0-5 Units Subcutaneous QHS   metoprolol succinate  50 mg Oral Daily   OLANZapine  10 mg Oral QHS   rosuvastatin  40 mg Oral Daily   sodium chloride flush  3 mL Intravenous Q12H   sodium chloride flush  3 mL Intravenous Q12H   tamsulosin  0.4 mg Oral Daily   Continuous Infusions:  sodium chloride     sodium chloride 1 mL/kg/hr (08/16/21 1024)   heparin 1,400 Units/hr (08/21/21 0700)   nitroGLYCERIN 30 mcg/min (08/21/21 0700)   PRN Meds: sodium chloride, acetaminophen, fentaNYL (SUBLIMAZE) injection, ondansetron (ZOFRAN) IV, sodium chloride flush   Vital Signs    Vitals:   08/21/21 0300 08/21/21 0400 08/21/21 0500 08/21/21 0600  BP: 126/68 123/70 113/76 122/85  Pulse: (!) 59 (!) 54 (!) 56 60  Resp: (!) 23 18 (!) 21 (!) 21  Temp:      TempSrc:      SpO2: 97% 97% 95% 95%  Weight:    66.8 kg  Height:        Intake/Output Summary (Last 24 hours) at 08/21/2021 0714 Last data filed at 08/21/2021 0700 Gross per 24 hour  Intake 586.1 ml  Output 3050 ml  Net -2463.9 ml    Last 3 Weights 08/21/2021 08/20/2021 08/19/2021  Weight (lbs) 147 lb 4.3 oz 147 lb 14.9 oz 147 lb 0.8 oz  Weight (kg) 66.8 kg 67.1 kg 66.7 kg      Telemetry    NSR- Personally Reviewed  ECG    9/3, NSR, anterior ST changes - stable- Personally Reviewed  Physical Exam   GEN: No acute distress.   Neck: No JVD Cardiac: RRR, no murmurs, rubs, or gallops.  Respiratory:  Clear to auscultation bilaterally. GI: Soft, nontender, non-distended  MS: No edema; No deformity.  No hematoma at the right radial site, 2+ pulse Neuro:  Nonfocal  Psych: Normal affect   Labs    High Sensitivity Troponin:   Recent Labs  Lab 08/15/21 1904 08/15/21 2253 08/16/21 0102 08/16/21 0614 08/16/21 1650  TROPONINIHS 28* 108* 232* 769* 4,567*       Chemistry Recent Labs  Lab 08/15/21 1614 08/16/21 0614 08/17/21 0058 08/19/21 0019 08/20/21 0035 08/21/21 0008  NA 134* 139   < > 136 135 136  K 4.2 3.7   < > 4.4 4.4 4.0  CL 107 107   < > 103 104 105  CO2 22 24   < > '26 24 25  '$ GLUCOSE 205* 104*   < > 225* 197* 196*  BUN 24* 20   < > '16 17 20  '$ CREATININE 1.30* 1.16   < > 1.60* 1.26* 1.29*  CALCIUM 8.2* 8.4*   < > 9.1 8.8* 9.0  PROT 7.1 6.4*  --   --   --   --   ALBUMIN 3.3* 3.0*  --   --   --   --  AST 23 64*  --   --   --   --   ALT 17 20  --   --   --   --   ALKPHOS 93 83  --   --   --   --   BILITOT 0.4 0.5  --   --   --   --   GFRNONAA 57* >60   < > 44* 59* 57*  ANIONGAP 5 8   < > '7 7 6   '$ < > = values in this interval not displayed.      Hematology Recent Labs  Lab 08/19/21 0019 08/20/21 0035 08/21/21 0008  WBC 5.9 6.1 5.7  RBC 4.26 4.09* 4.06*  HGB 11.8* 11.4* 11.3*  HCT 36.6* 34.9* 34.5*  MCV 85.9 85.3 85.0  MCH 27.7 27.9 27.8  MCHC 32.2 32.7 32.8  RDW 14.2 14.2 14.2  PLT 312 300 294     BNP Recent Labs  Lab 08/15/21 1614  BNP 31.0      DDimer No results for input(s): DDIMER in the last 168 hours.   Radiology    VAS US DOPPLER PRE CABG  Result Date: 08/19/2021 PREOPERATIVE VASCULAR EVALUATION Patient Name:  Robert Osborne  Date of Exam:   08/19/2021 Medical Rec #: [id number]        Accession #:    [id number] Date of Birth: 1946/08/06        Patient Gender: M Patient Age:   75 years Exam Location:  [facility] Procedure:      VAS US DOPPLER PRE CABG Referring Phys: HARRELL LIGHTFOOT  --------------------------------------------------------------------------------  Indications:      Pre-CABG. Risk Factors:     Hypertension, Diabetes. Comparison Study: no prior Performing Technologist: Archie Patten RVS  Examination Guidelines: A complete evaluation includes B-mode imaging, spectral Doppler, color Doppler, and power Doppler as needed of all accessible portions of each vessel. Bilateral testing is considered an integral part of a complete examination. Limited examinations for reoccurring indications may be performed as noted.  Right Carotid Findings: +----------+--------+--------+--------+------------+--------+           PSV cm/sEDV cm/sStenosisDescribe    Comments +----------+--------+--------+--------+------------+--------+ CCA Prox  94      13              heterogenous         +----------+--------+--------+--------+------------+--------+ CCA Distal85      13              heterogenous         +----------+--------+--------+--------+------------+--------+ ICA Prox  55      18      1-39%   heterogenous         +----------+--------+--------+--------+------------+--------+ ICA Distal50      13                                   +----------+--------+--------+--------+------------+--------+ ECA       114                                          +----------+--------+--------+--------+------------+--------+ +----------+--------+-------+--------+------------+           PSV cm/sEDV cmsDescribeArm Pressure +----------+--------+-------+--------+------------+ Subclavian71                                  +----------+--------+-------+--------+------------+ +---------+--------+--+--------+--+---------+  VertebralPSV cm/s39EDV cm/s16Antegrade +---------+--------+--+--------+--+---------+ Left Carotid Findings: +----------+--------+--------+--------+------------+--------+           PSV cm/sEDV cm/sStenosisDescribe    Comments  +----------+--------+--------+--------+------------+--------+ CCA Prox  84      9               heterogenous         +----------+--------+--------+--------+------------+--------+ CCA Distal83      15              heterogenous         +----------+--------+--------+--------+------------+--------+ ICA Prox  51      15      1-39%   heterogenous         +----------+--------+--------+--------+------------+--------+ ICA Distal63      13                                   +----------+--------+--------+--------+------------+--------+ ECA       99      7                                    +----------+--------+--------+--------+------------+--------+ +----------+--------+--------+--------+------------+ SubclavianPSV cm/sEDV cm/sDescribeArm Pressure +----------+--------+--------+--------+------------+           97                                   +----------+--------+--------+--------+------------+ +---------+--------+--+--------+-+---------+ VertebralPSV cm/s47EDV cm/s8Antegrade +---------+--------+--+--------+-+---------+  ABI Findings: +--------+------------------+-----+---------+--------+ Right   Rt Pressure (mmHg)IndexWaveform Comment  +--------+------------------+-----+---------+--------+ [id number]                    triphasic         +--------+------------------+-----+---------+--------+ PTA     194               1.36 triphasic         +--------+------------------+-----+---------+--------+ DP      156               1.09 triphasic         +--------+------------------+-----+---------+--------+ +--------+------------------+-----+---------+-----------------------+ Left    Lt Pressure (mmHg)IndexWaveform Comment                 +--------+------------------+-----+---------+-----------------------+ Brachial                       triphasicunable to tolerate cuff +--------+------------------+-----+---------+-----------------------+ PTA      204               1.43 triphasic                        +--------+------------------+-----+---------+-----------------------+ DP      154               1.08 triphasic                        +--------+------------------+-----+---------+-----------------------+ +-------+---------------+----------------+ ABI/TBIToday's ABI/TBIPrevious ABI/TBI +-------+---------------+----------------+ Right  1.36                            +-------+---------------+----------------+ Left   1.43                            +-------+---------------+----------------+  Right Doppler Findings: +--------+--------+-----+---------+--------+ Site  PressureIndexDoppler  Comments +--------+--------+-----+---------+--------+ [id number]          triphasic         +--------+--------+-----+---------+--------+ Radial               triphasic         +--------+--------+-----+---------+--------+ Ulnar                triphasic         +--------+--------+-----+---------+--------+  Left Doppler Findings: +--------+--------+-----+---------+-----------------------+ Site    PressureIndexDoppler  Comments                +--------+--------+-----+---------+-----------------------+ Brachial             triphasicunable to tolerate cuff +--------+--------+-----+---------+-----------------------+ Radial               triphasic                        +--------+--------+-----+---------+-----------------------+ Ulnar                triphasic                        +--------+--------+-----+---------+-----------------------+  Summary: Right Carotid: Velocities in the right ICA are consistent with a 1-39% stenosis. Left Carotid: Velocities in the left ICA are consistent with a 1-39% stenosis. Vertebrals: Bilateral vertebral arteries demonstrate antegrade flow. Right ABI: Resting right ankle-brachial index indicates noncompressible right lower extremity arteries. Left ABI: Resting left ankle-brachial  index indicates noncompressible left lower extremity arteries. Right Upper Extremity: Doppler waveforms decrease 50% with right radial compression. Doppler waveforms remain within normal limits with right ulnar compression. Left Upper Extremity: Doppler waveforms remain within normal limits with left radial compression. Doppler waveforms remain within normal limits with left ulnar compression.  Electronically signed by Deitra Mayo MD on 08/19/2021 at 3:17:37 PM.    Final     Cardiac Studies   Procedures  CORONARY BALLOON ANGIOPLASTY  LEFT HEART CATH AND CORONARY ANGIOGRAPHY   Conclusion      Ost Cx to Prox Cx lesion is 75% stenosed.   2nd Mrg lesion is 80% stenosed.   1st Mrg lesion is 90% stenosed.   Ost LAD to Prox LAD lesion is 25% stenosed.   1st Diag lesion is 90% stenosed.   Mid LAD lesion is 90% stenosed.   Dist LAD lesion is 100% stenosed.  Flow restored with a wire.  Unable to advance balloon.   Mid RCA lesion is 25% stenosed.   RPAV lesion is 90% stenosed.   Balloon angioplasty was performed using a BALLOON SAPPHIRE 1.0X10.   Post intervention, there is a 95% residual stenosis.   There is mild to moderate left ventricular systolic dysfunction.   LV end diastolic pressure is normal.   The left ventricular ejection fraction is 45-50% by visual estimate.   There is no aortic valve stenosis.   Severe disease in the proximal to mid LAD, and ostial circumflex.Marland Kitchen  Heavily calcified lesion at the origin of a large first diagonal.  Culprit for today's presentation was an occlusion in the mid LAD, past the areas of heavy calcification.  This was successfully wired and flow restored, but a balloon could not be advanced through the heavily calcific disease more proximally.     Will obtain cardiac surgery consult.  If surgery not an option, will have to see if there is an appropriate high risk PCI strategy.   Continue IV heparin after 8 hours post sheath removal.  IV tirofiban  started in the Cath Lab which will continue for 12 hours.   Severe tortuosity of the right subclavian requiring 75 cm destination sheath. Coronary Diagrams  Diagnostic Dominance: Right Intervention  Implants      Echo: IMPRESSIONS     1. Left ventricular ejection fraction, by estimation, is 55%. The left  ventricle has normal function. The left ventricle demonstrates regional  wall motion abnormalities (see scoring diagram/findings for description).  There is moderate asymmetric left  ventricular hypertrophy of the basal and septal segments. Left ventricular  diastolic parameters are consistent with Grade I diastolic dysfunction  (impaired relaxation).   2. Right ventricular systolic function is normal. The right ventricular  size is normal. Tricuspid regurgitation signal is inadequate for assessing  PA pressure.   3. The mitral valve is grossly normal. Trivial mitral valve  regurgitation.   4. The aortic valve is tricuspid. Aortic valve regurgitation is not  visualized. No aortic stenosis is present. Aortic valve mean gradient  measures 2.0 mmHg.   5. The inferior vena cava is normal in size with greater than 50%  respiratory variability, suggesting right atrial pressure of 3 mmHg.   Comparison(s): Prior images reviewed side by side. LVEF approximately 55%  with new periapical wall motion abnormality suggestive of mid ot distal  LAD disease versus possible stress-induced cardiomyopathy.   Patient Profile     75 y.o. male with NSTEMI, anterior distribution  Assessment & Plan    CAD S/p PTCA of LAD distally to restore flow.  Severe diffuse 3 vessel CAD. Plan for CABG on 9/8.  Continue IV heparin.  Patient did have recurrent chest pain yesterday -now on IV Ntg. Also on amlodipine and Toprol XL.   ASpirin and high dose rosuvastatin to continue.  IV NTG as well.    2. Hyperlipidemia: LDL above target on 9/2 in the setting of MI.  On high dose Crestor.   3. DM: SSI. Will add  Novolog 4 units tid.   4. AKI: improved creatinine down to 1.29  For questions or updates, please contact [place] Please consult [email or web address] for contact info under        Signed, Wilma Michaelson [place], MD  08/21/2021, 7:14 AM

## 2021-08-21 NOTE — Progress Notes (Signed)
ANTICOAGULATION CONSULT NOTE  Pharmacy Consult for heparin Indication: chest pain/ACS  Allergies  Allergen Reactions   Enalapril Cough    Patient Measurements: Height: '5\' 7"'$  (170.2 cm) Weight: 67.1 kg (147 lb 14.9 oz) IBW/kg (Calculated) : 66.1  Vital Signs: Temp: 97.8 F (36.6 C) (09/06 2000) Temp Source: Oral (09/06 2000) BP: 126/68 (09/07 0300) Pulse Rate: 59 (09/07 0300)  Labs: Recent Labs    08/19/21 0019 08/20/21 0035 08/21/21 0008  HGB 11.8* 11.4* 11.3*  HCT 36.6* 34.9* 34.5*  PLT 312 300 294  HEPARINUNFRC 0.46 0.48 0.56  CREATININE 1.60* 1.26* 1.29*     Estimated Creatinine Clearance: 44.8 mL/min (A) (by C-G formula based on SCr of 1.29 mg/dL (H)).   Medical History: Past Medical History:  Diagnosis Date   Bladder cancer ([place]) 08/18/2013   Cancer ([place])    papillary urethral carcinoma,low grade, non-invasive   Diabetes mellitus without complication ([place])    Erectile dysfunction 10/24/2016   Hyperlipidemia associated with type 2 diabetes mellitus ([place]) 05/17/2013   Hypertension associated with diabetes ([place])    Hypokalemia 11/27/2017   Hypomagnesemia 11/27/2017   Major frontotemporal neurocognitive disorder, probable, with behavioral disturbance 11/27/2017   Vitamin D deficiency 05/17/2013    Assessment: 75yo male c/o CP x several days that became acutely worse. Cath showed multivessel CAD. Troponin increased to 4,567 and patient started on heparin until CABG planned for 9/8.  Heparin drip 1400 uts/hr with therapeutic heparin level of 0.56. No s/sx of bleeding. CBC stable.  Goal of Therapy:  Heparin level 0.3-0.7 units/ml  Monitor platelets by anticoagulation protocol: Yes   Plan:  Will continue heparin infusion at 1400 units/hr Monitor daily HL, CBC, and for s/sx of bleeding   Thank you for allowing pharmacy to participate in this patient's care.  Reatha Harps, PharmD PGY1 Pharmacy Resident 08/21/2021 5:21 AM Check [email or web address] for unit specific  pharmacy number

## 2021-08-21 NOTE — Progress Notes (Signed)
     [address]       [place] [address]             [phone]      No events  OR tomorrow for [place]

## 2021-08-21 NOTE — Progress Notes (Signed)
Patient just finished breakfast. He denies chest pain or shortness of breath. Per op orders have been done as patient for CABG in am with Dr. Kipp Brood.

## 2021-08-21 NOTE — Plan of Care (Signed)
  Problem: Clinical Measurements: Goal: Cardiovascular complication will be avoided Outcome: Progressing   Problem: Nutrition: Goal: Adequate nutrition will be maintained Outcome: Progressing   Problem: Elimination: Goal: Will not experience complications related to bowel motility Outcome: Progressing   Problem: Pain Managment: Goal: General experience of comfort will improve Outcome: Progressing   Problem: Safety: Goal: Ability to remain free from injury will improve Outcome: Progressing

## 2021-08-22 ENCOUNTER — Inpatient Hospital Stay (HOSPITAL_COMMUNITY): Payer: Medicare HMO

## 2021-08-22 ENCOUNTER — Inpatient Hospital Stay (HOSPITAL_COMMUNITY): Payer: Medicare HMO | Admitting: Anesthesiology

## 2021-08-22 ENCOUNTER — Inpatient Hospital Stay (HOSPITAL_COMMUNITY)
Admission: EM | Disposition: A | Discharge status: Home Health | Payer: Self-pay | Source: Home / Self Care | Attending: Thoracic Surgery (Cardiothoracic Vascular Surgery)

## 2021-08-22 DIAGNOSIS — I214 Non-ST elevation (NSTEMI) myocardial infarction: Secondary | ICD-10-CM

## 2021-08-22 DIAGNOSIS — I25110 Atherosclerotic heart disease of native coronary artery with unstable angina pectoris: Secondary | ICD-10-CM

## 2021-08-22 HISTORY — PX: ENDOVEIN HARVEST OF GREATER SAPHENOUS VEIN: SHX5059

## 2021-08-22 HISTORY — PX: CORONARY ARTERY BYPASS GRAFT: SHX141

## 2021-08-22 HISTORY — PX: TEE WITHOUT CARDIOVERSION: SHX5443

## 2021-08-22 LAB — POCT I-STAT 7, (LYTES, BLD GAS, ICA,H+H)
Acid-Base Excess: 2 mmol/L (ref 0.0–2.0)
Acid-Base Excess: 2 mmol/L (ref 0.0–2.0)
Acid-Base Excess: 3 mmol/L — ABNORMAL HIGH (ref 0.0–2.0)
Acid-Base Excess: 3 mmol/L — ABNORMAL HIGH (ref 0.0–2.0)
Acid-base deficit: 3 mmol/L — ABNORMAL HIGH (ref 0.0–2.0)
Acid-base deficit: 6 mmol/L — ABNORMAL HIGH (ref 0.0–2.0)
Acid-base deficit: 3 mmol/L — ABNORMAL HIGH (ref 0.0–2.0)
Bicarbonate: 27.7 mmol/L (ref 20.0–28.0)
Bicarbonate: 25.9 mmol/L (ref 20.0–28.0)
Bicarbonate: 25.8 mmol/L (ref 20.0–28.0)
Bicarbonate: 27.8 mmol/L (ref 20.0–28.0)
Bicarbonate: 22.7 mmol/L (ref 20.0–28.0)
Bicarbonate: 19.2 mmol/L — ABNORMAL LOW (ref 20.0–28.0)
Bicarbonate: 21.3 mmol/L (ref 20.0–28.0)
Calcium, Ion: 1.29 mmol/L (ref 1.15–1.40)
Calcium, Ion: 0.98 mmol/L — ABNORMAL LOW (ref 1.15–1.40)
Calcium, Ion: 1.06 mmol/L — ABNORMAL LOW (ref 1.15–1.40)
Calcium, Ion: 1.06 mmol/L — ABNORMAL LOW (ref 1.15–1.40)
Calcium, Ion: 1.27 mmol/L (ref 1.15–1.40)
Calcium, Ion: 1.17 mmol/L (ref 1.15–1.40)
Calcium, Ion: 1.15 mmol/L (ref 1.15–1.40)
HCT: 35 % — ABNORMAL LOW (ref 39.0–52.0)
HCT: 25 % — ABNORMAL LOW (ref 39.0–52.0)
HCT: 25 % — ABNORMAL LOW (ref 39.0–52.0)
HCT: 23 % — ABNORMAL LOW (ref 39.0–52.0)
HCT: 27 % — ABNORMAL LOW (ref 39.0–52.0)
HCT: 29 % — ABNORMAL LOW (ref 39.0–52.0)
HCT: 29 % — ABNORMAL LOW (ref 39.0–52.0)
Hemoglobin: 11.9 g/dL — ABNORMAL LOW (ref 13.0–17.0)
Hemoglobin: 8.5 g/dL — ABNORMAL LOW (ref 13.0–17.0)
Hemoglobin: 8.5 g/dL — ABNORMAL LOW (ref 13.0–17.0)
Hemoglobin: 7.8 g/dL — ABNORMAL LOW (ref 13.0–17.0)
Hemoglobin: 9.2 g/dL — ABNORMAL LOW (ref 13.0–17.0)
Hemoglobin: 9.9 g/dL — ABNORMAL LOW (ref 13.0–17.0)
Hemoglobin: 9.9 g/dL — ABNORMAL LOW (ref 13.0–17.0)
O2 Saturation: 100 %
O2 Saturation: 100 %
O2 Saturation: 100 %
O2 Saturation: 100 %
O2 Saturation: 94 %
O2 Saturation: 91 %
O2 Saturation: 89 %
Patient temperature: 36.7
Patient temperature: 37
Patient temperature: 37.4
Potassium: 4.2 mmol/L (ref 3.5–5.1)
Potassium: 4.1 mmol/L (ref 3.5–5.1)
Potassium: 4.5 mmol/L (ref 3.5–5.1)
Potassium: 4.5 mmol/L (ref 3.5–5.1)
Potassium: 4.1 mmol/L (ref 3.5–5.1)
Potassium: 4.1 mmol/L (ref 3.5–5.1)
Potassium: 4 mmol/L (ref 3.5–5.1)
Sodium: 139 mmol/L (ref 135–145)
Sodium: 137 mmol/L (ref 135–145)
Sodium: 137 mmol/L (ref 135–145)
Sodium: 138 mmol/L (ref 135–145)
Sodium: 140 mmol/L (ref 135–145)
Sodium: 140 mmol/L (ref 135–145)
Sodium: 140 mmol/L (ref 135–145)
TCO2: 29 mmol/L (ref 22–32)
TCO2: 27 mmol/L (ref 22–32)
TCO2: 27 mmol/L (ref 22–32)
TCO2: 29 mmol/L (ref 22–32)
TCO2: 24 mmol/L (ref 22–32)
TCO2: 20 mmol/L — ABNORMAL LOW (ref 22–32)
TCO2: 22 mmol/L (ref 22–32)
pCO2 arterial: 49.1 mmHg — ABNORMAL HIGH (ref 32.0–48.0)
pCO2 arterial: 35.6 mmHg (ref 32.0–48.0)
pCO2 arterial: 31.9 mmHg — ABNORMAL LOW (ref 32.0–48.0)
pCO2 arterial: 41.2 mmHg (ref 32.0–48.0)
pCO2 arterial: 40.7 mmHg (ref 32.0–48.0)
pCO2 arterial: 33.8 mmHg (ref 32.0–48.0)
pCO2 arterial: 35.4 mmHg (ref 32.0–48.0)
pH, Arterial: 7.359 (ref 7.350–7.450)
pH, Arterial: 7.471 — ABNORMAL HIGH (ref 7.350–7.450)
pH, Arterial: 7.516 — ABNORMAL HIGH (ref 7.350–7.450)
pH, Arterial: 7.437 (ref 7.350–7.450)
pH, Arterial: 7.352 (ref 7.350–7.450)
pH, Arterial: 7.362 (ref 7.350–7.450)
pH, Arterial: 7.389 (ref 7.350–7.450)
pO2, Arterial: 387 mmHg — ABNORMAL HIGH (ref 83.0–108.0)
pO2, Arterial: 335 mmHg — ABNORMAL HIGH (ref 83.0–108.0)
pO2, Arterial: 283 mmHg — ABNORMAL HIGH (ref 83.0–108.0)
pO2, Arterial: 335 mmHg — ABNORMAL HIGH (ref 83.0–108.0)
pO2, Arterial: 74 mmHg — ABNORMAL LOW (ref 83.0–108.0)
pO2, Arterial: 63 mmHg — ABNORMAL LOW (ref 83.0–108.0)
pO2, Arterial: 57 mmHg — ABNORMAL LOW (ref 83.0–108.0)

## 2021-08-22 LAB — CBC
HCT: 38.7 % — ABNORMAL LOW (ref 39.0–52.0)
HCT: 27.7 % — ABNORMAL LOW (ref 39.0–52.0)
HCT: 31 % — ABNORMAL LOW (ref 39.0–52.0)
Hemoglobin: 12.4 g/dL — ABNORMAL LOW (ref 13.0–17.0)
Hemoglobin: 8.8 g/dL — ABNORMAL LOW (ref 13.0–17.0)
Hemoglobin: 10.1 g/dL — ABNORMAL LOW (ref 13.0–17.0)
MCH: 27.4 pg (ref 26.0–34.0)
MCH: 27.8 pg (ref 26.0–34.0)
MCH: 28.2 pg (ref 26.0–34.0)
MCHC: 32 g/dL (ref 30.0–36.0)
MCHC: 31.8 g/dL (ref 30.0–36.0)
MCHC: 32.6 g/dL (ref 30.0–36.0)
MCV: 85.6 fL (ref 80.0–100.0)
MCV: 87.4 fL (ref 80.0–100.0)
MCV: 86.6 fL (ref 80.0–100.0)
Platelets: 282 10*3/uL (ref 150–400)
Platelets: 169 10*3/uL (ref 150–400)
Platelets: 216 10*3/uL (ref 150–400)
RBC: 4.52 MIL/uL (ref 4.22–5.81)
RBC: 3.17 MIL/uL — ABNORMAL LOW (ref 4.22–5.81)
RBC: 3.58 MIL/uL — ABNORMAL LOW (ref 4.22–5.81)
RDW: 14.4 % (ref 11.5–15.5)
RDW: 14.5 % (ref 11.5–15.5)
RDW: 14.5 % (ref 11.5–15.5)
WBC: 5.3 10*3/uL (ref 4.0–10.5)
WBC: 8.4 10*3/uL (ref 4.0–10.5)
WBC: 15.1 10*3/uL — ABNORMAL HIGH (ref 4.0–10.5)
nRBC: 0 % (ref 0.0–0.2)
nRBC: 0 % (ref 0.0–0.2)
nRBC: 0 % (ref 0.0–0.2)

## 2021-08-22 LAB — APTT: aPTT: 34 seconds (ref 24–36)

## 2021-08-22 LAB — GLUCOSE, CAPILLARY
Glucose-Capillary: 173 mg/dL — ABNORMAL HIGH (ref 70–99)
Glucose-Capillary: 167 mg/dL — ABNORMAL HIGH (ref 70–99)
Glucose-Capillary: 113 mg/dL — ABNORMAL HIGH (ref 70–99)
Glucose-Capillary: 95 mg/dL (ref 70–99)
Glucose-Capillary: 137 mg/dL — ABNORMAL HIGH (ref 70–99)
Glucose-Capillary: 139 mg/dL — ABNORMAL HIGH (ref 70–99)
Glucose-Capillary: 161 mg/dL — ABNORMAL HIGH (ref 70–99)
Glucose-Capillary: 183 mg/dL — ABNORMAL HIGH (ref 70–99)
Glucose-Capillary: 183 mg/dL — ABNORMAL HIGH (ref 70–99)
Glucose-Capillary: 177 mg/dL — ABNORMAL HIGH (ref 70–99)
Glucose-Capillary: 161 mg/dL — ABNORMAL HIGH (ref 70–99)

## 2021-08-22 LAB — POCT I-STAT, CHEM 8
BUN: 22 mg/dL (ref 8–23)
BUN: 20 mg/dL (ref 8–23)
BUN: 20 mg/dL (ref 8–23)
BUN: 19 mg/dL (ref 8–23)
BUN: 17 mg/dL (ref 8–23)
Calcium, Ion: 1.29 mmol/L (ref 1.15–1.40)
Calcium, Ion: 1.29 mmol/L (ref 1.15–1.40)
Calcium, Ion: 1.06 mmol/L — ABNORMAL LOW (ref 1.15–1.40)
Calcium, Ion: 1.08 mmol/L — ABNORMAL LOW (ref 1.15–1.40)
Calcium, Ion: 1.34 mmol/L (ref 1.15–1.40)
Chloride: 103 mmol/L (ref 98–111)
Chloride: 103 mmol/L (ref 98–111)
Chloride: 102 mmol/L (ref 98–111)
Chloride: 101 mmol/L (ref 98–111)
Chloride: 104 mmol/L (ref 98–111)
Creatinine, Ser: 1.2 mg/dL (ref 0.61–1.24)
Creatinine, Ser: 1.2 mg/dL (ref 0.61–1.24)
Creatinine, Ser: 1 mg/dL (ref 0.61–1.24)
Creatinine, Ser: 1 mg/dL (ref 0.61–1.24)
Creatinine, Ser: 1 mg/dL (ref 0.61–1.24)
Glucose, Bld: 175 mg/dL — ABNORMAL HIGH (ref 70–99)
Glucose, Bld: 143 mg/dL — ABNORMAL HIGH (ref 70–99)
Glucose, Bld: 109 mg/dL — ABNORMAL HIGH (ref 70–99)
Glucose, Bld: 127 mg/dL — ABNORMAL HIGH (ref 70–99)
Glucose, Bld: 133 mg/dL — ABNORMAL HIGH (ref 70–99)
HCT: 39 % (ref 39.0–52.0)
HCT: 32 % — ABNORMAL LOW (ref 39.0–52.0)
HCT: 24 % — ABNORMAL LOW (ref 39.0–52.0)
HCT: 25 % — ABNORMAL LOW (ref 39.0–52.0)
HCT: 24 % — ABNORMAL LOW (ref 39.0–52.0)
Hemoglobin: 13.3 g/dL (ref 13.0–17.0)
Hemoglobin: 10.9 g/dL — ABNORMAL LOW (ref 13.0–17.0)
Hemoglobin: 8.2 g/dL — ABNORMAL LOW (ref 13.0–17.0)
Hemoglobin: 8.5 g/dL — ABNORMAL LOW (ref 13.0–17.0)
Hemoglobin: 8.2 g/dL — ABNORMAL LOW (ref 13.0–17.0)
Potassium: 4.2 mmol/L (ref 3.5–5.1)
Potassium: 4.4 mmol/L (ref 3.5–5.1)
Potassium: 4.7 mmol/L (ref 3.5–5.1)
Potassium: 4.7 mmol/L (ref 3.5–5.1)
Potassium: 3.9 mmol/L (ref 3.5–5.1)
Sodium: 139 mmol/L (ref 135–145)
Sodium: 138 mmol/L (ref 135–145)
Sodium: 136 mmol/L (ref 135–145)
Sodium: 137 mmol/L (ref 135–145)
Sodium: 139 mmol/L (ref 135–145)
TCO2: 26 mmol/L (ref 22–32)
TCO2: 25 mmol/L (ref 22–32)
TCO2: 27 mmol/L (ref 22–32)
TCO2: 26 mmol/L (ref 22–32)
TCO2: 23 mmol/L (ref 22–32)

## 2021-08-22 LAB — BASIC METABOLIC PANEL
Anion gap: 7 (ref 5–15)
Anion gap: 10 (ref 5–15)
BUN: 23 mg/dL (ref 8–23)
BUN: 17 mg/dL (ref 8–23)
CO2: 26 mmol/L (ref 22–32)
CO2: 22 mmol/L (ref 22–32)
Calcium: 9.1 mg/dL (ref 8.9–10.3)
Calcium: 8.3 mg/dL — ABNORMAL LOW (ref 8.9–10.3)
Chloride: 104 mmol/L (ref 98–111)
Chloride: 104 mmol/L (ref 98–111)
Creatinine, Ser: 1.39 mg/dL — ABNORMAL HIGH (ref 0.61–1.24)
Creatinine, Ser: 1.31 mg/dL — ABNORMAL HIGH (ref 0.61–1.24)
GFR, Estimated: 52 mL/min — ABNORMAL LOW (ref >60)
GFR, Estimated: 56 mL/min — ABNORMAL LOW (ref >60)
Glucose, Bld: 172 mg/dL — ABNORMAL HIGH (ref 70–99)
Glucose, Bld: 171 mg/dL — ABNORMAL HIGH (ref 70–99)
Potassium: 4.1 mmol/L (ref 3.5–5.1)
Potassium: 4.1 mmol/L (ref 3.5–5.1)
Sodium: 137 mmol/L (ref 135–145)
Sodium: 136 mmol/L (ref 135–145)

## 2021-08-22 LAB — POCT I-STAT EG7
Acid-Base Excess: 1 mmol/L (ref 0.0–2.0)
Bicarbonate: 25.5 mmol/L (ref 20.0–28.0)
Calcium, Ion: 1.07 mmol/L — ABNORMAL LOW (ref 1.15–1.40)
HCT: 26 % — ABNORMAL LOW (ref 39.0–52.0)
Hemoglobin: 8.8 g/dL — ABNORMAL LOW (ref 13.0–17.0)
O2 Saturation: 70 %
Potassium: 3.7 mmol/L (ref 3.5–5.1)
Sodium: 140 mmol/L (ref 135–145)
TCO2: 27 mmol/L (ref 22–32)
pCO2, Ven: 38.9 mmHg — ABNORMAL LOW (ref 44.0–60.0)
pH, Ven: 7.424 (ref 7.250–7.430)
pO2, Ven: 36 mmHg (ref 32.0–45.0)

## 2021-08-22 LAB — PROTIME-INR
INR: 1.4 — ABNORMAL HIGH (ref 0.8–1.2)
Prothrombin Time: 16.8 seconds — ABNORMAL HIGH (ref 11.4–15.2)

## 2021-08-22 LAB — HEMOGLOBIN AND HEMATOCRIT, BLOOD
HCT: 23.9 % — ABNORMAL LOW (ref 39.0–52.0)
Hemoglobin: 7.9 g/dL — ABNORMAL LOW (ref 13.0–17.0)

## 2021-08-22 LAB — HEPARIN LEVEL (UNFRACTIONATED): Heparin Unfractionated: 0.63 IU/mL (ref 0.30–0.70)

## 2021-08-22 LAB — PLATELET COUNT: Platelets: 183 10*3/uL (ref 150–400)

## 2021-08-22 LAB — MAGNESIUM: Magnesium: 3.4 mg/dL — ABNORMAL HIGH (ref 1.7–2.4)

## 2021-08-22 IMAGING — DX DG CHEST 1V PORT
1 series · 1 of 1 positions shown · non-contrast
Comparison: Preoperative assessment [DATE].

CLINICAL DATA: Post CABG.  75-year-old male.

EXAM:
PORTABLE CHEST 1 VIEW

[chest]
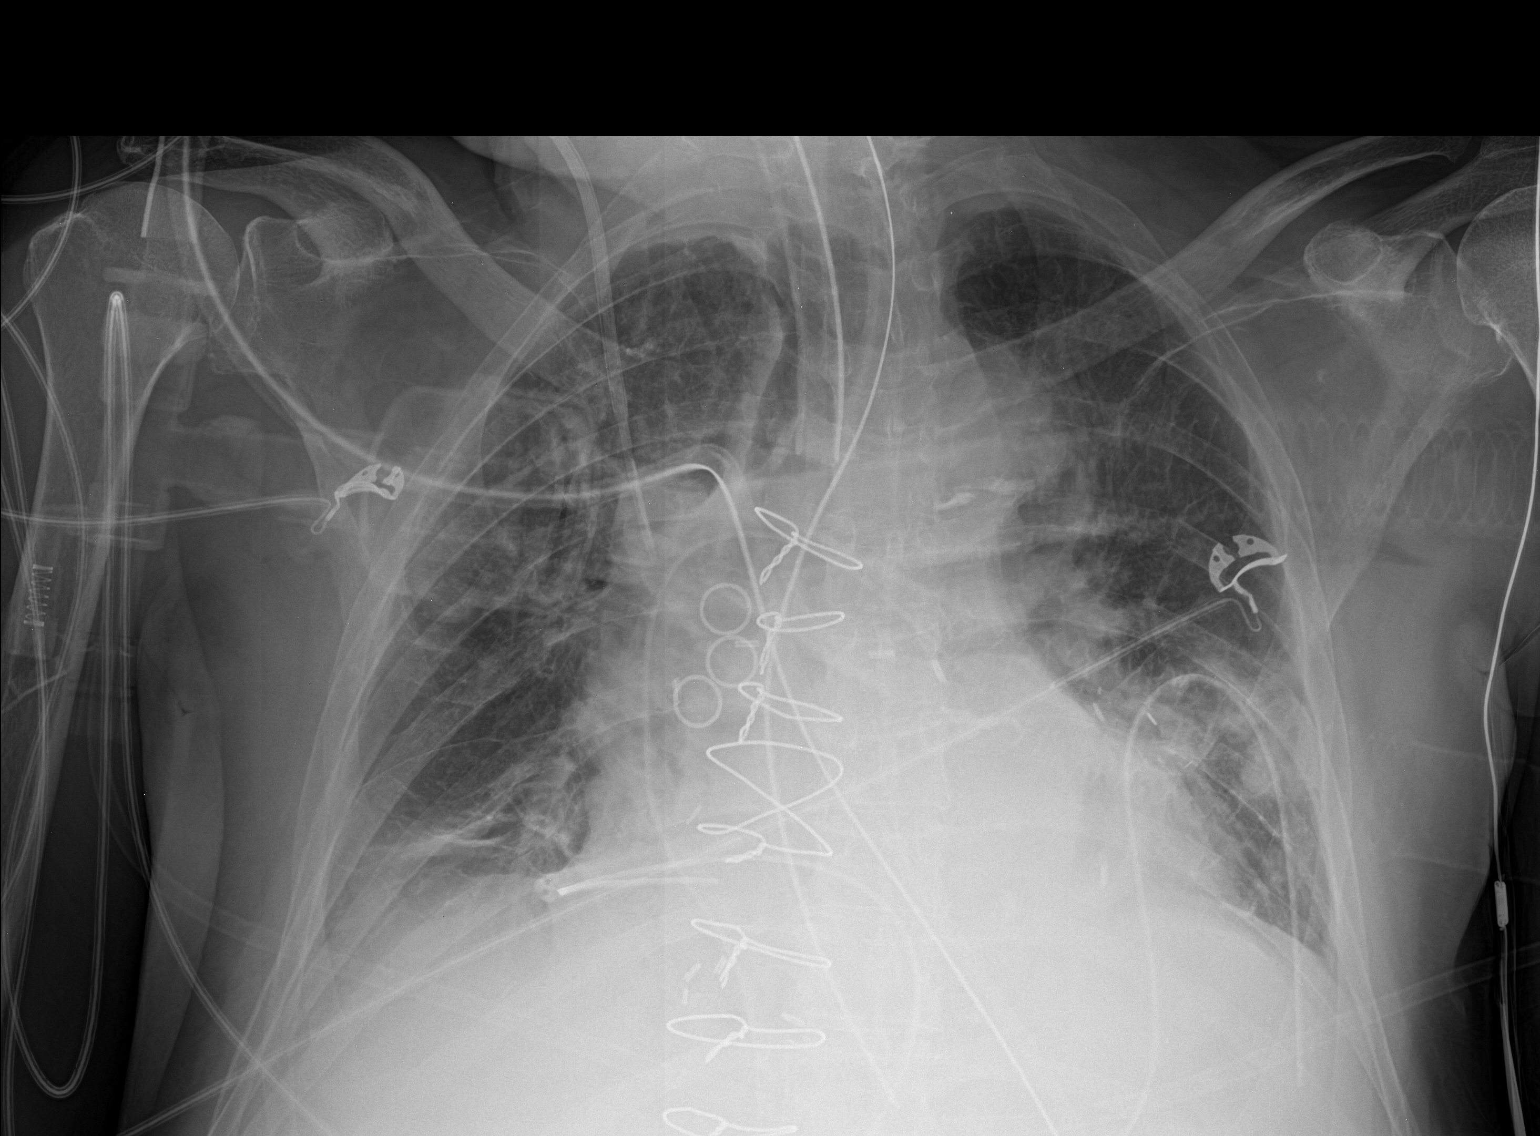

[1 of 1 positions shown; findings below may reference images not displayed]

FINDINGS: Image rotated to the RIGHT with low lung volumes.

RIGHT IJ vascular sheath with Swan-Ganz catheter retracted into the
proximal to mid superior vena cava.

Endotracheal tube approximately 2-2.5 cm above the carina on this
projection. Rotation limiting assessment.

Changes of median sternotomy and CABG.

Chest support tubes, LEFT-sided chest tube and tubes projecting over
the RIGHT heart border and along the inferior heart, 3 total chest
support tubes in place

Gastric tube courses through in off the field of the radiograph tip
may be imaged or just off the field of the radiograph.

No visible pneumothorax. EKG leads also project over the patient's
chest. Patchy airspace opacities throughout the chest some with
linear features, some with less well-defined appearance.
IMPRESSION: Lines and tubes as above. Rotation limits assessment. Exact position
of gastric tube is difficult to assess. Consider dedicated abdominal
films for better localization. Tip may be at the GE junction though
the side port can not be visualized.

Patchy airspace opacities throughout the chest some with linear
features, some with less well-defined appearance. Many of these
areas likely reflecting atelectasis. More dense airspace process in
the retrocardiac region may represent developing consolidative
changes.

No visible pneumothorax.

Features of CABG.

These results will be called to the ordering clinician or
representative by the Radiologist Assistant, and communication
documented in the PACS or [REDACTED].

## 2021-08-22 SURGERY — CORONARY ARTERY BYPASS GRAFTING (CABG)
Anesthesia: General | Site: Chest

## 2021-08-22 MED ORDER — SODIUM CHLORIDE 0.9 % IV SOLN: INTRAVENOUS | Status: DC

## 2021-08-22 MED ORDER — METOCLOPRAMIDE HCL 5 MG/ML IJ SOLN
10.0000 mg | Freq: Four times a day (QID) | INTRAMUSCULAR | Status: AC
  Administered 2021-08-22 – 2021-08-23 (×3): 10 mg via INTRAVENOUS
  Filled 2021-08-22 (×3): qty 2

## 2021-08-22 MED ORDER — ASPIRIN 81 MG PO CHEW: 324.0000 mg | CHEWABLE_TABLET | Freq: Every day | ORAL | Status: DC

## 2021-08-22 MED ORDER — MIDAZOLAM HCL (PF) 10 MG/2ML IJ SOLN
INTRAMUSCULAR | Status: AC
  Filled 2021-08-22: qty 2

## 2021-08-22 MED ORDER — POTASSIUM CHLORIDE 10 MEQ/50ML IV SOLN
10.0000 meq | INTRAVENOUS | Status: AC
Start: 2021-08-22 — End: 2021-08-22

## 2021-08-22 MED ORDER — LACTATED RINGERS IV SOLN: INTRAVENOUS | Status: DC | PRN

## 2021-08-22 MED ORDER — 0.9 % SODIUM CHLORIDE (POUR BTL) OPTIME
TOPICAL | Status: DC | PRN
  Administered 2021-08-22: 5000 mL

## 2021-08-22 MED ORDER — BISACODYL 5 MG PO TBEC
10.0000 mg | DELAYED_RELEASE_TABLET | Freq: Every day | ORAL | Status: DC
  Administered 2021-08-24 – 2021-08-28 (×3): 10 mg via ORAL
  Filled 2021-08-22 (×4): qty 2

## 2021-08-22 MED ORDER — NICARDIPINE HCL IN NACL 20-0.86 MG/200ML-% IV SOLN
5.0000 mg/h | INTRAVENOUS | Status: DC
  Administered 2021-08-22: 5 mg/h via INTRAVENOUS
  Filled 2021-08-22 (×2): qty 200

## 2021-08-22 MED ORDER — CHLORHEXIDINE GLUCONATE 0.12% ORAL RINSE (MEDLINE KIT)
15.0000 mL | Freq: Two times a day (BID) | OROMUCOSAL | Status: DC
  Administered 2021-08-22 – 2021-08-27 (×10): 15 mL via OROMUCOSAL

## 2021-08-22 MED ORDER — CHLORHEXIDINE GLUCONATE 0.12 % MT SOLN: 15.0000 mL | OROMUCOSAL | Status: AC

## 2021-08-22 MED ORDER — FENTANYL CITRATE (PF) 250 MCG/5ML IJ SOLN
INTRAMUSCULAR | Status: DC | PRN
  Administered 2021-08-22: 75 ug via INTRAVENOUS
  Administered 2021-08-22: 150 ug via INTRAVENOUS
  Administered 2021-08-22 (×2): 100 ug via INTRAVENOUS
  Administered 2021-08-22: 50 ug via INTRAVENOUS
  Administered 2021-08-22 (×2): 100 ug via INTRAVENOUS
  Administered 2021-08-22: 75 ug via INTRAVENOUS
  Administered 2021-08-22: 100 ug via INTRAVENOUS
  Administered 2021-08-22 (×3): 50 ug via INTRAVENOUS
  Administered 2021-08-22: 100 ug via INTRAVENOUS
  Administered 2021-08-22 (×3): 50 ug via INTRAVENOUS

## 2021-08-22 MED ORDER — BISACODYL 10 MG RE SUPP: 10.0000 mg | Freq: Every day | RECTAL | Status: DC

## 2021-08-22 MED ORDER — FAMOTIDINE 20 MG IN NS 100 ML IVPB
20.0000 mg | Freq: Two times a day (BID) | INTRAVENOUS | Status: AC
  Administered 2021-08-22: 20 mg via INTRAVENOUS
  Filled 2021-08-22 (×2): qty 100

## 2021-08-22 MED ORDER — PROPOFOL 10 MG/ML IV BOLUS
INTRAVENOUS | Status: AC
  Filled 2021-08-22: qty 20

## 2021-08-22 MED ORDER — PLASMA-LYTE A IV SOLN
INTRAVENOUS | Status: DC | PRN
  Administered 2021-08-22: 1000 mL via INTRAVASCULAR

## 2021-08-22 MED ORDER — SODIUM CHLORIDE (PF) 0.9 % IJ SOLN
OROMUCOSAL | Status: DC | PRN
  Administered 2021-08-22 (×3): 4 mL via TOPICAL

## 2021-08-22 MED ORDER — MAGNESIUM SULFATE 4 GM/100ML IV SOLN
4.0000 g | Freq: Once | INTRAVENOUS | Status: AC
  Administered 2021-08-22: 4 g via INTRAVENOUS
  Filled 2021-08-22: qty 100

## 2021-08-22 MED ORDER — MORPHINE SULFATE (PF) 2 MG/ML IV SOLN
1.0000 mg | INTRAVENOUS | Status: DC | PRN
  Administered 2021-08-22: 1 mg via INTRAVENOUS
  Administered 2021-08-22: 4 mg via INTRAVENOUS
  Administered 2021-08-22: 2 mg via INTRAVENOUS
  Administered 2021-08-22 – 2021-08-23 (×2): 4 mg via INTRAVENOUS
  Administered 2021-08-23 – 2021-08-25 (×6): 2 mg via INTRAVENOUS
  Filled 2021-08-22 (×3): qty 1
  Filled 2021-08-22: qty 2
  Filled 2021-08-22 (×4): qty 1
  Filled 2021-08-22 (×2): qty 2
  Filled 2021-08-22 (×2): qty 1

## 2021-08-22 MED ORDER — ASPIRIN EC 325 MG PO TBEC: 325.0000 mg | DELAYED_RELEASE_TABLET | Freq: Every day | ORAL | Status: DC

## 2021-08-22 MED ORDER — EPINEPHRINE HCL 5 MG/250ML IV SOLN IN NS: 0.0000 ug/min | INTRAVENOUS | Status: DC

## 2021-08-22 MED ORDER — ORAL CARE MOUTH RINSE
15.0000 mL | OROMUCOSAL | Status: DC
  Administered 2021-08-22 (×2): 15 mL via OROMUCOSAL

## 2021-08-22 MED ORDER — ONDANSETRON HCL 4 MG/2ML IJ SOLN: 4.0000 mg | Freq: Four times a day (QID) | INTRAMUSCULAR | Status: DC | PRN

## 2021-08-22 MED ORDER — DEXMEDETOMIDINE HCL IN NACL 400 MCG/100ML IV SOLN: 0.0000 ug/kg/h | INTRAVENOUS | Status: DC

## 2021-08-22 MED ORDER — LACTATED RINGERS IV SOLN: INTRAVENOUS | Status: DC

## 2021-08-22 MED ORDER — DOCUSATE SODIUM 100 MG PO CAPS
200.0000 mg | ORAL_CAPSULE | Freq: Every day | ORAL | Status: DC
  Administered 2021-08-25 – 2021-08-30 (×4): 200 mg via ORAL
  Filled 2021-08-22 (×5): qty 2

## 2021-08-22 MED ORDER — SODIUM CHLORIDE 0.45 % IV SOLN: INTRAVENOUS | Status: DC | PRN

## 2021-08-22 MED ORDER — CHLORHEXIDINE GLUCONATE CLOTH 2 % EX PADS
6.0000 | MEDICATED_PAD | Freq: Every day | CUTANEOUS | Status: DC
  Administered 2021-08-22 – 2021-08-28 (×7): 6 via TOPICAL

## 2021-08-22 MED ORDER — METOPROLOL TARTRATE 25 MG/10 ML ORAL SUSPENSION: 12.5000 mg | Freq: Two times a day (BID) | ORAL | Status: DC

## 2021-08-22 MED ORDER — ACETAMINOPHEN 160 MG/5ML PO SOLN: 650.0000 mg | Freq: Once | ORAL | Status: AC

## 2021-08-22 MED ORDER — DEXTROSE 50 % IV SOLN: 0.0000 mL | INTRAVENOUS | Status: DC | PRN

## 2021-08-22 MED ORDER — HEPARIN SODIUM (PORCINE) 1000 UNIT/ML IJ SOLN
INTRAMUSCULAR | Status: AC
  Filled 2021-08-22: qty 1

## 2021-08-22 MED ORDER — HEPARIN SODIUM (PORCINE) 1000 UNIT/ML IJ SOLN
INTRAMUSCULAR | Status: DC | PRN
  Administered 2021-08-22: 5000 [IU] via INTRAVENOUS
  Administered 2021-08-22: 22000 [IU] via INTRAVENOUS

## 2021-08-22 MED ORDER — NICARDIPINE HCL IN NACL 20-0.86 MG/200ML-% IV SOLN
3.0000 mg/h | INTRAVENOUS | Status: DC
  Administered 2021-08-22: 5 mg/h via INTRAVENOUS
  Administered 2021-08-22: 7.5 mg/h via INTRAVENOUS
  Filled 2021-08-22 (×3): qty 200

## 2021-08-22 MED ORDER — NITROGLYCERIN IN D5W 200-5 MCG/ML-% IV SOLN
0.0000 ug/min | INTRAVENOUS | Status: DC
  Administered 2021-08-22: 20 ug/min via INTRAVENOUS

## 2021-08-22 MED ORDER — ROCURONIUM BROMIDE 10 MG/ML (PF) SYRINGE
PREFILLED_SYRINGE | INTRAVENOUS | Status: AC
  Filled 2021-08-22: qty 10

## 2021-08-22 MED ORDER — MILRINONE LACTATE IN DEXTROSE 20-5 MG/100ML-% IV SOLN: 0.3000 ug/kg/min | INTRAVENOUS | Status: DC

## 2021-08-22 MED ORDER — SODIUM CHLORIDE 0.9% FLUSH: 3.0000 mL | INTRAVENOUS | Status: DC | PRN

## 2021-08-22 MED ORDER — ORAL CARE MOUTH RINSE
15.0000 mL | Freq: Two times a day (BID) | OROMUCOSAL | Status: DC
  Administered 2021-08-23 – 2021-08-27 (×9): 15 mL via OROMUCOSAL

## 2021-08-22 MED ORDER — MIDAZOLAM HCL 5 MG/5ML IJ SOLN
INTRAMUSCULAR | Status: DC | PRN
Start: 2021-08-22 — End: 2021-08-22
  Administered 2021-08-22: 2 mg via INTRAVENOUS
  Administered 2021-08-22: 3 mg via INTRAVENOUS
  Administered 2021-08-22: 1 mg via INTRAVENOUS

## 2021-08-22 MED ORDER — ROCURONIUM BROMIDE 10 MG/ML (PF) SYRINGE
PREFILLED_SYRINGE | INTRAVENOUS | Status: DC | PRN
  Administered 2021-08-22: 50 mg via INTRAVENOUS
  Administered 2021-08-22: 70 mg via INTRAVENOUS
  Administered 2021-08-22: 30 mg via INTRAVENOUS
  Administered 2021-08-22: 50 mg via INTRAVENOUS

## 2021-08-22 MED ORDER — ACETAMINOPHEN 650 MG RE SUPP
650.0000 mg | Freq: Once | RECTAL | Status: AC
  Administered 2021-08-22: 650 mg via RECTAL

## 2021-08-22 MED ORDER — PROPOFOL 10 MG/ML IV BOLUS
INTRAVENOUS | Status: DC | PRN
  Administered 2021-08-22: 30 mg via INTRAVENOUS
  Administered 2021-08-22: 70 mg via INTRAVENOUS

## 2021-08-22 MED ORDER — SODIUM BICARBONATE 8.4 % IV SOLN
50.0000 meq | Freq: Once | INTRAVENOUS | Status: AC
  Administered 2021-08-22: 50 meq via INTRAVENOUS

## 2021-08-22 MED ORDER — PHENYLEPHRINE HCL-NACL 20-0.9 MG/250ML-% IV SOLN: 0.0000 ug/min | INTRAVENOUS | Status: DC

## 2021-08-22 MED ORDER — SODIUM CHLORIDE 0.9 % IV SOLN: INTRAVENOUS | Status: DC | PRN

## 2021-08-22 MED ORDER — PROTAMINE SULFATE 10 MG/ML IV SOLN
INTRAVENOUS | Status: DC | PRN
  Administered 2021-08-22: 20 mg via INTRAVENOUS
  Administered 2021-08-22: 200 mg via INTRAVENOUS

## 2021-08-22 MED ORDER — VANCOMYCIN HCL IN DEXTROSE 1-5 GM/200ML-% IV SOLN
1000.0000 mg | Freq: Once | INTRAVENOUS | Status: AC
  Administered 2021-08-22: 1000 mg via INTRAVENOUS
  Filled 2021-08-22: qty 200

## 2021-08-22 MED ORDER — PANTOPRAZOLE SODIUM 40 MG PO TBEC
40.0000 mg | DELAYED_RELEASE_TABLET | Freq: Every day | ORAL | Status: DC
  Administered 2021-08-24 – 2021-08-30 (×7): 40 mg via ORAL
  Filled 2021-08-22 (×7): qty 1

## 2021-08-22 MED ORDER — METOPROLOL TARTRATE 12.5 MG HALF TABLET: 12.5000 mg | ORAL_TABLET | Freq: Two times a day (BID) | ORAL | Status: DC

## 2021-08-22 MED ORDER — ACETAMINOPHEN 500 MG PO TABS
1000.0000 mg | ORAL_TABLET | Freq: Four times a day (QID) | ORAL | Status: AC
  Administered 2021-08-23 – 2021-08-27 (×7): 1000 mg via ORAL
  Filled 2021-08-22 (×7): qty 2

## 2021-08-22 MED ORDER — ALBUMIN HUMAN 5 % IV SOLN: INTRAVENOUS | Status: DC | PRN

## 2021-08-22 MED ORDER — SODIUM CHLORIDE 0.9% FLUSH
3.0000 mL | Freq: Two times a day (BID) | INTRAVENOUS | Status: DC
Start: 2021-08-23 — End: 2021-08-28
  Administered 2021-08-23 – 2021-08-26 (×8): 3 mL via INTRAVENOUS

## 2021-08-22 MED ORDER — MIDAZOLAM HCL 2 MG/2ML IJ SOLN: 2.0000 mg | INTRAMUSCULAR | Status: DC | PRN

## 2021-08-22 MED ORDER — ALBUMIN HUMAN 5 % IV SOLN
250.0000 mL | INTRAVENOUS | Status: AC | PRN
Start: 2021-08-22 — End: 2021-08-23
  Administered 2021-08-22: 12.5 g via INTRAVENOUS

## 2021-08-22 MED ORDER — CHLORHEXIDINE GLUCONATE CLOTH 2 % EX PADS: 6.0000 | MEDICATED_PAD | Freq: Every day | CUTANEOUS | Status: DC

## 2021-08-22 MED ORDER — FENTANYL CITRATE (PF) 250 MCG/5ML IJ SOLN
INTRAMUSCULAR | Status: AC
  Filled 2021-08-22: qty 25

## 2021-08-22 MED ORDER — INSULIN REGULAR(HUMAN) IN NACL 100-0.9 UT/100ML-% IV SOLN
INTRAVENOUS | Status: DC
  Administered 2021-08-22: 1.6 [IU]/h via INTRAVENOUS

## 2021-08-22 MED ORDER — TRAMADOL HCL 50 MG PO TABS
50.0000 mg | ORAL_TABLET | ORAL | Status: DC | PRN
  Administered 2021-08-27: 50 mg via ORAL
  Administered 2021-08-30: 100 mg via ORAL
  Filled 2021-08-22: qty 1
  Filled 2021-08-22: qty 2

## 2021-08-22 MED ORDER — OXYCODONE HCL 5 MG PO TABS
5.0000 mg | ORAL_TABLET | ORAL | Status: DC | PRN
  Administered 2021-08-23 – 2021-08-24 (×3): 5 mg via ORAL
  Administered 2021-08-24 (×2): 10 mg via ORAL
  Administered 2021-08-24 – 2021-08-28 (×7): 5 mg via ORAL
  Administered 2021-08-29 – 2021-08-30 (×2): 10 mg via ORAL
  Filled 2021-08-22: qty 2
  Filled 2021-08-22 (×5): qty 1
  Filled 2021-08-22 (×2): qty 2
  Filled 2021-08-22 (×3): qty 1
  Filled 2021-08-22: qty 2
  Filled 2021-08-22 (×3): qty 1

## 2021-08-22 MED ORDER — CEFAZOLIN SODIUM-DEXTROSE 2-4 GM/100ML-% IV SOLN
2.0000 g | Freq: Three times a day (TID) | INTRAVENOUS | Status: AC
  Administered 2021-08-22 – 2021-08-24 (×6): 2 g via INTRAVENOUS
  Filled 2021-08-22 (×6): qty 100

## 2021-08-22 MED ORDER — ACETAMINOPHEN 160 MG/5ML PO SOLN
1000.0000 mg | Freq: Four times a day (QID) | ORAL | Status: AC
  Administered 2021-08-23 – 2021-08-26 (×9): 1000 mg
  Filled 2021-08-22 (×9): qty 40.6

## 2021-08-22 MED ORDER — SODIUM CHLORIDE 0.9 % IV SOLN: 250.0000 mL | INTRAVENOUS | Status: DC

## 2021-08-22 MED ORDER — METOPROLOL TARTRATE 5 MG/5ML IV SOLN: 2.5000 mg | INTRAVENOUS | Status: DC | PRN

## 2021-08-22 MED ORDER — NOREPINEPHRINE 4 MG/250ML-% IV SOLN: 0.0000 ug/min | INTRAVENOUS | Status: DC

## 2021-08-22 MED ORDER — LACTATED RINGERS IV SOLN: 500.0000 mL | Freq: Once | INTRAVENOUS | Status: DC | PRN

## 2021-08-22 SURGICAL SUPPLY — 94 items
BAG DECANTER FOR FLEXI CONT (MISCELLANEOUS) ×4 IMPLANT
BLADE CLIPPER SURG (BLADE) ×4 IMPLANT
BLADE STERNUM SYSTEM 6 (BLADE) ×4 IMPLANT
BLADE SURG 11 STRL SS (BLADE) ×4 IMPLANT
BNDG ELASTIC 4X5.8 VLCR STR LF (GAUZE/BANDAGES/DRESSINGS) ×4 IMPLANT
BNDG ELASTIC 6X5.8 VLCR STR LF (GAUZE/BANDAGES/DRESSINGS) ×4 IMPLANT
BNDG GAUZE ELAST 4 BULKY (GAUZE/BANDAGES/DRESSINGS) ×4 IMPLANT
CABLE SURGICAL S-101-97-12 (CABLE) ×4 IMPLANT
CANISTER SUCT 3000ML PPV (MISCELLANEOUS) ×4 IMPLANT
CANNULA MC2 2 STG 29/37 NON-V (CANNULA) ×3 IMPLANT
CANNULA MC2 TWO STAGE (CANNULA) ×1
CANNULA NON VENT 20FR 12 (CANNULA) ×4 IMPLANT
CATH ROBINSON RED A/P 18FR (CATHETERS) ×12 IMPLANT
CLIP LIGATING EXTRA SM BLUE (MISCELLANEOUS) IMPLANT
CLIP RETRACTION 3.0MM CORONARY (MISCELLANEOUS) IMPLANT
CLIP VESOCCLUDE MED 24/CT (CLIP) ×4 IMPLANT
CLIP VESOCCLUDE SM WIDE 24/CT (CLIP) IMPLANT
CONN ST 1/2X1/2  BEN (MISCELLANEOUS) ×1
CONN ST 1/2X1/2 BEN (MISCELLANEOUS) ×3 IMPLANT
CONNECTOR BLAKE 2:1 CARIO BLK (MISCELLANEOUS) ×4 IMPLANT
CONTAINER PROTECT SURGISLUSH (MISCELLANEOUS) ×8 IMPLANT
COVER SURGICAL LIGHT HANDLE (MISCELLANEOUS) ×4 IMPLANT
DEFOGGER ANTIFOG KIT (MISCELLANEOUS) ×4 IMPLANT
DERMABOND ADVANCED (GAUZE/BANDAGES/DRESSINGS) ×2
DERMABOND ADVANCED .7 DNX12 (GAUZE/BANDAGES/DRESSINGS) ×6 IMPLANT
DRAIN CHANNEL 19F RND (DRAIN) ×12 IMPLANT
DRAIN CONNECTOR BLAKE 1:1 (MISCELLANEOUS) ×4 IMPLANT
DRAPE CARDIOVASCULAR INCISE (DRAPES) ×1
DRAPE INCISE IOBAN 66X45 STRL (DRAPES) IMPLANT
DRAPE SRG 135X102X78XABS (DRAPES) ×3 IMPLANT
DRAPE WARM FLUID 44X44 (DRAPES) ×4 IMPLANT
DRESSING AQUACEL AG SP 3.5X10 (GAUZE/BANDAGES/DRESSINGS) ×3 IMPLANT
DRSG AQUACEL AG ADV 3.5X10 (GAUZE/BANDAGES/DRESSINGS) ×4 IMPLANT
DRSG AQUACEL AG SP 3.5X10 (GAUZE/BANDAGES/DRESSINGS) ×4
DRSG COVADERM 4X14 (GAUZE/BANDAGES/DRESSINGS) IMPLANT
ELECT BLADE 4.0 EZ CLEAN MEGAD (MISCELLANEOUS) ×4
ELECT REM PT RETURN 9FT ADLT (ELECTROSURGICAL) ×8
ELECTRODE BLDE 4.0 EZ CLN MEGD (MISCELLANEOUS) ×3 IMPLANT
ELECTRODE REM PT RTRN 9FT ADLT (ELECTROSURGICAL) ×6 IMPLANT
FELT TEFLON 1X6 (MISCELLANEOUS) ×4 IMPLANT
GAUZE SPONGE 4X4 12PLY STRL (GAUZE/BANDAGES/DRESSINGS) ×8 IMPLANT
GAUZE SPONGE 4X4 12PLY STRL LF (GAUZE/BANDAGES/DRESSINGS) ×8 IMPLANT
GLOVE SURG ENC MOIS LTX SZ7 (GLOVE) ×8 IMPLANT
GLOVE SURG ENC TEXT LTX SZ7.5 (GLOVE) ×8 IMPLANT
GLOVE SURG MICRO LTX SZ6 (GLOVE) ×40 IMPLANT
GLOVE SURG POLYISO LF SZ6 (GLOVE) ×8 IMPLANT
GLOVE SURG UNDER POLY LF SZ6 (GLOVE) ×4 IMPLANT
GLOVE SURG UNDER POLY LF SZ6.5 (GLOVE) ×16 IMPLANT
GOWN STRL REUS W/ TWL LRG LVL3 (GOWN DISPOSABLE) ×24 IMPLANT
GOWN STRL REUS W/ TWL XL LVL3 (GOWN DISPOSABLE) ×9 IMPLANT
GOWN STRL REUS W/TWL LRG LVL3 (GOWN DISPOSABLE) ×8
GOWN STRL REUS W/TWL XL LVL3 (GOWN DISPOSABLE) ×3
HEMOSTAT POWDER SURGIFOAM 1G (HEMOSTASIS) ×12 IMPLANT
INSERT SUTURE HOLDER (MISCELLANEOUS) ×4 IMPLANT
KIT BASIN OR (CUSTOM PROCEDURE TRAY) ×4 IMPLANT
KIT SUCTION CATH 14FR (SUCTIONS) ×4 IMPLANT
KIT TURNOVER KIT B (KITS) ×4 IMPLANT
KIT VASOVIEW HEMOPRO 2 VH 4000 (KITS) ×4 IMPLANT
LEAD PACING MYOCARDI (MISCELLANEOUS) IMPLANT
MARKER GRAFT CORONARY BYPASS (MISCELLANEOUS) ×16 IMPLANT
NS IRRIG 1000ML POUR BTL (IV SOLUTION) ×20 IMPLANT
PACK ACCESSORY CANNULA KIT (KITS) ×4 IMPLANT
PACK E OPEN HEART (SUTURE) ×4 IMPLANT
PACK OPEN HEART (CUSTOM PROCEDURE TRAY) ×4 IMPLANT
PAD ARMBOARD 7.5X6 YLW CONV (MISCELLANEOUS) ×8 IMPLANT
PAD ELECT DEFIB RADIOL ZOLL (MISCELLANEOUS) ×4 IMPLANT
PENCIL BUTTON HOLSTER BLD 10FT (ELECTRODE) ×4 IMPLANT
POSITIONER HEAD DONUT 9IN (MISCELLANEOUS) ×4 IMPLANT
PUNCH AORTIC ROTATE 4.0MM (MISCELLANEOUS) ×4 IMPLANT
SET MPS 3-ND DEL (MISCELLANEOUS) ×4 IMPLANT
SUPPORT HEART JANKE-BARRON (MISCELLANEOUS) ×4 IMPLANT
SUT BONE WAX W31G (SUTURE) ×4 IMPLANT
SUT ETHIBOND X763 2 0 SH 1 (SUTURE) ×8 IMPLANT
SUT MNCRL AB 3-0 PS2 18 (SUTURE) ×8 IMPLANT
SUT MNCRL AB 4-0 PS2 18 (SUTURE) ×8 IMPLANT
SUT PDS AB 1 CTX 36 (SUTURE) ×8 IMPLANT
SUT PROLENE 4 0 RB 1 (SUTURE) ×1
SUT PROLENE 4 0 SH DA (SUTURE) ×8 IMPLANT
SUT PROLENE 4-0 RB1 .5 CRCL 36 (SUTURE) ×3 IMPLANT
SUT PROLENE 5 0 C 1 36 (SUTURE) ×24 IMPLANT
SUT PROLENE 7 0 BV 1 (SUTURE) ×12 IMPLANT
SUT PROLENE 7 0 BV1 MDA (SUTURE) ×8 IMPLANT
SUT STEEL 6MS V (SUTURE) ×8 IMPLANT
SUT VIC AB 2-0 CT1 27 (SUTURE) ×2
SUT VIC AB 2-0 CT1 TAPERPNT 27 (SUTURE) ×6 IMPLANT
SYSTEM SAHARA CHEST DRAIN ATS (WOUND CARE) ×4 IMPLANT
TAPE CLOTH SURG 4X10 WHT LF (GAUZE/BANDAGES/DRESSINGS) ×8 IMPLANT
TOWEL GREEN STERILE (TOWEL DISPOSABLE) ×4 IMPLANT
TOWEL GREEN STERILE FF (TOWEL DISPOSABLE) ×4 IMPLANT
TRAY FOLEY SLVR 14FR TEMP STAT (SET/KITS/TRAYS/PACK) ×4 IMPLANT
TRAY FOLEY SLVR 16FR TEMP STAT (SET/KITS/TRAYS/PACK) IMPLANT
TUBING LAP HI FLOW INSUFFLATIO (TUBING) ×4 IMPLANT
UNDERPAD 30X36 HEAVY ABSORB (UNDERPADS AND DIAPERS) ×4 IMPLANT
WATER STERILE IRR 1000ML POUR (IV SOLUTION) ×8 IMPLANT

## 2021-08-22 NOTE — Progress Notes (Signed)
Wean protocol was started and patient passed wean criteria. ABG results marginal.  Dr. Roxan Hockey was at beside and RN was given a verbal order to give an amp of bicarb and to go forward with extubation.

## 2021-08-22 NOTE — Plan of Care (Signed)
  Problem: Cardiac: Goal: Will achieve and/or maintain hemodynamic stability Outcome: Progressing   Problem: Clinical Measurements: Goal: Postoperative complications will be avoided or minimized Outcome: Progressing   Problem: Respiratory: Goal: Respiratory status will improve Outcome: Progressing   Problem: Urinary Elimination: Goal: Ability to achieve and maintain adequate renal perfusion and functioning will improve Outcome: Progressing   

## 2021-08-22 NOTE — Anesthesia Procedure Notes (Signed)
Procedure Name: Intubation Date/Time: 08/22/2021 7:46 AM Performed by: Glynda Jaeger, CRNA Pre-anesthesia Checklist: Patient identified, Emergency Drugs available, Suction available and Patient being monitored Patient Re-evaluated:Patient Re-evaluated prior to induction Oxygen Delivery Method: Circle System Utilized Preoxygenation: Pre-oxygenation with 100% oxygen Induction Type: IV induction Ventilation: Mask ventilation without difficulty Laryngoscope Size: Mac and 4 Grade View: Grade I Tube type: Oral Tube size: 8.0 mm Number of attempts: 1 Airway Equipment and Method: Stylet Placement Confirmation: ETT inserted through vocal cords under direct vision, positive ETCO2 and breath sounds checked- equal and bilateral Secured at: 24 cm Tube secured with: Tape Dental Injury: Teeth and Oropharynx as per pre-operative assessment  Comments: Intubated by Everlene Other SRNA

## 2021-08-22 NOTE — Progress Notes (Signed)
     [address]       [place] [address]             [phone]       No events  Vitals:   08/22/21 0600 08/22/21 0613  BP: (!) 144/72 134/79  Pulse: 61 65  Resp: 17 19  Temp:    SpO2: 95% 95%   Alert NAD Sinus EWOB  OR today for CABG 4-5  Robert Osborne O Robert Osborne

## 2021-08-22 NOTE — Anesthesia Preprocedure Evaluation (Addendum)
Anesthesia Evaluation  Patient identified by MRN, date of birth, ID band Patient awake    Reviewed: Allergy & Precautions, NPO status , Patient's Chart, lab work & pertinent test results  Airway Mallampati: II  TM Distance: >3 FB Neck ROM: Full    Dental no notable dental hx.    Pulmonary neg pulmonary ROS, former smoker,    Pulmonary exam normal breath sounds clear to auscultation       Cardiovascular hypertension, + CAD and + Past MI  Normal cardiovascular exam Rhythm:Regular Rate:Normal  Left Ventricle: Left ventricular ejection fraction, by estimation, is  55%. The left ventricle has normal function. The left ventricle  demonstrates regional wall motion abnormalities. The left ventricular  internal cavity size was normal in size. There is  moderate asymmetric left ventricular hypertrophy of the basal and septal  segments. Left ventricular diastolic parameters are consistent with Grade  I diastolic dysfunction (impaired relaxation).     LV Wall Scoring:  The mid and distal anterior septum and entire apex are akinetic. The  anterior  wall, antero-lateral wall, inferior wall, posterior wall, basal  anteroseptal  segment, mid inferoseptal segment, and basal inferoseptal segment are  normal.   Right Ventricle: The right ventricular size is normal. No increase in  right ventricular wall thickness. Right ventricular systolic function is  normal. Tricuspid regurgitation signal is inadequate for assessing PA  pressure.   Left Atrium: Left atrial size was normal in size.   Right Atrium: Right atrial size was normal in size.   Pericardium: There is no evidence of pericardial effusion. Presence of  pericardial fat pad.   Mitral Valve: The mitral valve is grossly normal. Trivial mitral valve  regurgitation. MV peak gradient, 3.9 mmHg. The mean mitral valve gradient  is 1.0 mmHg.   Tricuspid Valve: The tricuspid valve is  grossly normal. Tricuspid valve  regurgitation is trivial.   Aortic Valve: The aortic valve is tricuspid. There is mild aortic valve  annular calcification. Aortic valve regurgitation is not visualized. No  aortic stenosis is present. Aortic valve mean gradient measures 2.0 mmHg.  Aortic valve peak gradient measures  4.8 mmHg. Aortic valve area, by VTI measures 3.16 cm.   Pulmonic Valve: The pulmonic valve was not well visualized. Pulmonic valve  regurgitation is trivial.   Aorta: The aortic root is normal in size and structure.    Neuro/Psych negative neurological ROS  negative psych ROS   GI/Hepatic negative GI ROS, Neg liver ROS,   Endo/Other  diabetes, Insulin Dependent  Renal/GU Renal InsufficiencyRenal disease  negative genitourinary   Musculoskeletal negative musculoskeletal ROS (+)   Abdominal   Peds negative pediatric ROS (+)  Hematology  (+) anemia ,   Anesthesia Other Findings   Reproductive/Obstetrics negative OB ROS                            Anesthesia Physical Anesthesia Plan  ASA: 3  Anesthesia Plan: General   Post-op Pain Management:    Induction: Intravenous  PONV Risk Score and Plan: 2 and Ondansetron, Dexamethasone and Treatment may vary due to age or medical condition  Airway Management Planned: Oral ETT  Additional Equipment: Arterial line, CVP, PA Cath, TEE and Ultrasound Guidance Line Placement  Intra-op Plan:   Post-operative Plan: Extubation in OR and Post-operative intubation/ventilation  Informed Consent: I have reviewed the patients History and Physical, chart, labs and discussed the procedure including the risks, benefits and alternatives for the  proposed anesthesia with the patient or authorized representative who has indicated his/her understanding and acceptance.     Dental advisory given  Plan Discussed with: CRNA and Surgeon  Anesthesia Plan Comments:         Anesthesia Quick  Evaluation

## 2021-08-22 NOTE — Hospital Course (Addendum)
History of Present Illness:  Mr. Robert Osborne is a 75 year old gentleman that presented to [facility] emergency department on 08/15/2021 with exertional anginal symptoms.  He states that he has had a 2 to 28-monthhistory of worsening chest pain, but on this day the pain was much worse.  He was ruled in for an NSTEMI and [facility]    H[facility] Course:   He underwent a left heart cath which showed three-vessel coronary artery disease along with an occlusion to the LAD that required angioplasty.  It was felt the patient would benefit from coronary artery bypass grafting and Cardiothoracic surgery consultation was requested.  He was evaluated by Dr. LKipp Broodwho was in agreement the patient would benefit from coronary bypass grafting.  The patient developed and acute kidney injury post cath and it was felt he should allow this to recover prior to proceeding with coronary bypass grafting procedure.  The risks and benefits of the procedure were explained to the patient and he was agreeable to proceed.  The patient was taken to the operating room on 08/22/2021.  He was taken to the operating room and underwent CABG x 4 utilizing LIMA to Diagonal, SVG to LAD with Endarterectomy, SVG to OM, and SVG to PDA.  He also underwent Endoscopic harvest from his greater saphenous vein from his right leg.  He tolerated the procedure without difficulty and was taken to the SICU in stable condition.  The patient was extubated the evening of surgery.  His post operative EKG was normal, however repeat EKG POD #1 showed some ST changes.  This could have possibly been due to pericarditis, however Echocardiogram was obtained.  This showed preserved LV function of 50-55%.  There was hypokinesis in the Apex.  The patient required endarterectomy in the operating room of his LAD and was started on Plavix.  He was hypertensive and was treated with Cardene which was weaned as hemodynamics allowed.  Once off  his drips his arterial line was removed without difficulty.  He was volume overloaded and started on IV diuretics.  The patient developed worsening chest pain, with concerning ST changes from morning EKG his Cardene drip was discontinued and he was started on NTG.  A troponin level was obtained and was 317 with repeat level being 341.  Cardiology was consulted and evaluated the patient.  They felt the patient would not be a candidate for further intervention and ischemia would need to be managed medically .  Patients oral intake was poor.  SLP evaluation was obtained and patient was felt to be at increased risk of aspiration and was kept NPO.  He underwent placement of NG tube and tube feedings were initiated per dietary recommendations.  The patient's chest pain resolved and he was transitioned off NTG drip to Imdur and Lopressor.    He developed a fever and was started on empiric Maxipime for empiric HCAP coverage.  Per Dr. LKipp Brood this was stopped on 09/12. Patient pulled NGT out on 09/11. Nurse replaced NGT. KUB done showed possible ileus. Patient accidentally removed NGT on 09/12. Repeat SLP evaluation was performed and patient is able to be given clear liquids. He had a bowel movement. Right IJ line was also removed on 09/13.  He continued to participate with SLP.  They have advanced patient to a regular diet.  He remains tachycardic and hypertensive and his Lopressor dose was titrated. He is weakened and deconditioned and PT/OT consults were requested.  Both of  which recommended home health which has been arranged. He was felt surgically stable for transfer from the ICU to 4E for further convalescence on 09/13.  The patient remained clinically stable.  His blood pressure and heart rate improved with titration of his Lopressor.  He is ambulating with assistance.  His surgical incisions are healing without evidence of infection.  He is medically stable for discharge home today.

## 2021-08-22 NOTE — Transfer of Care (Signed)
Immediate Anesthesia Transfer of Care Note  Patient: Robert Osborne  Procedure(s) Performed: CORONARY ARTERY BYPASS GRAFTING (CABG), ON PUMP, TIMES FOUR , USING LEFT INTERNAL MAMMARY ARTERY AND ENDOSCOPICALLY HARVESTED RIGHT GREATER SAPHENOUS VEIN (Chest) TRANSESOPHAGEAL ECHOCARDIOGRAM (TEE) APPLICATION OF CELL SAVER ENDOVEIN HARVEST OF GREATER SAPHENOUS VEIN  Patient Location: ICU  Anesthesia Type:General  Level of Consciousness: Patient remains intubated per anesthesia plan  Airway & Oxygen Therapy: Patient remains intubated per anesthesia plan and Patient placed on Ventilator (see vital sign flow sheet for setting)  Post-op Assessment: Report given to RN and Post -op Vital signs reviewed and stable  Post vital signs: Reviewed and stable  Last Vitals:  Vitals Value Taken Time  BP    Temp    Pulse 64 08/22/21 1332  Resp 14 08/22/21 1332  SpO2 100 % 08/22/21 1332  Vitals shown include unvalidated device data.  Last Pain:  Vitals:   08/22/21 0430  TempSrc:   PainSc: 0-No pain      Patients Stated Pain Goal: 2 ([id number])  Complications: No notable events documented.

## 2021-08-22 NOTE — Procedures (Signed)
Extubation Procedure Note  Patient Details:   Name: Robert Osborne DOB: 04/27/1946 MRN: [id number]   Airway Documentation:    Vent end date: 08/22/21 Vent end time: 1811   Evaluation  O2 sats: stable throughout Complications: No apparent complications Patient did tolerate procedure well. Bilateral Breath Sounds: Clear, Diminished   Yes  Patient was extubated to a 4L [place] without any complications, dyspnea or stridor noted. NIF: -22, VC: 791m, positive cuff leak prior to extubation.   CClaretta Fraise9/07/2021, 6:11 PM

## 2021-08-22 NOTE — Progress Notes (Signed)
      [address]       [place], [address]             [phone]      S/p CABG x 4  BP 130/65   Pulse 76   Temp 98.6 F (37 C)   Resp (!) 21   Ht '5\' 7"'$  (1.702 m)   Wt 65.7 kg   SpO2 100%   BMI 22.69 kg/m     Intake/Output Summary (Last 24 hours) at 08/22/2021 1810 Last data filed at 08/22/2021 1800 Gross per 24 hour  Intake 4394.18 ml  Output 4315 ml  Net 79.18 ml   Minimal CT output  Meets parameters, mild metabolic acidosis- extubate  Doing well early postop  Remo Lipps C. Roxan Hockey, MD [facility] [phone]

## 2021-08-22 NOTE — Anesthesia Procedure Notes (Signed)
Central Venous Catheter Insertion Performed by: Myrtie Soman, MD, anesthesiologist Start/End9/07/2021 6:50 AM, 08/22/2021 7:10 AM Patient location: Pre-op. Preanesthetic checklist: patient identified, IV checked, site marked, risks and benefits discussed, surgical consent, monitors and equipment checked, pre-op evaluation, timeout performed and anesthesia consent Position: Trendelenburg Lidocaine 1% used for infiltration and patient sedated Hand hygiene performed , maximum sterile barriers used  and Seldinger technique used Catheter size: 8.5 Fr Total catheter length 10. Central line was placed.Sheath introducer Procedure performed using ultrasound guided technique. Ultrasound Notes:anatomy identified, needle tip was noted to be adjacent to the nerve/plexus identified, no ultrasound evidence of intravascular and/or intraneural injection and image(s) printed for medical record Attempts: 1 Following insertion, line sutured, dressing applied and Biopatch. Post procedure assessment: blood return through all ports, free fluid flow and no air  Patient tolerated the procedure well with no immediate complications. Additional procedure comments: Flo-Trac used.

## 2021-08-22 NOTE — Anesthesia Procedure Notes (Signed)
Arterial Line Insertion Start/End9/07/2021 6:50 AM, 08/22/2021 6:55 AM Performed by: Myrtie Soman, MD, Tine Mabee, Dahlia Bailiff, CRNA, CRNA  Patient location: Pre-op. Preanesthetic checklist: patient identified, IV checked, site marked, risks and benefits discussed, surgical consent, monitors and equipment checked, pre-op evaluation, timeout performed and anesthesia consent Lidocaine 1% used for infiltration Left, radial was placed Catheter size: 20 G Hand hygiene performed  and maximum sterile barriers used   Attempts: 1 Procedure performed without using ultrasound guided technique. Following insertion, Biopatch and dressing applied. Patient tolerated the procedure well with no immediate complications. Additional procedure comments: Inserted by Everlene Other SRNA.

## 2021-08-22 NOTE — Progress Notes (Signed)
EVENING ROUNDS NOTE :     [address]       [place] [address]             [phone]                 Day of Surgery Procedure(s) (LRB): CORONARY ARTERY BYPASS GRAFTING (CABG), ON PUMP, TIMES FOUR , USING LEFT INTERNAL MAMMARY ARTERY AND ENDOSCOPICALLY HARVESTED RIGHT GREATER SAPHENOUS VEIN (N/A) TRANSESOPHAGEAL ECHOCARDIOGRAM (TEE) (N/A) APPLICATION OF CELL SAVER ENDOVEIN HARVEST OF GREATER SAPHENOUS VEIN  Total Length of Stay:  LOS: 6 days  BP 123/76   Pulse 63   Temp 98.1 F (36.7 C)   Resp 17   Ht '5\' 7"'$  (1.702 m)   Wt 65.7 kg   SpO2 100%   BMI 22.69 kg/m   .Intake/Output      09/07 0701 09/08 0700 09/08 0701 09/09 0700   P.O. 240    I.V. (mL/kg) 549.1 (8.4) 1894 (28.8)   Blood  350   IV Piggyback  1220.4   Total Intake(mL/kg) 789.1 (12) 3464.4 (52.7)   Urine (mL/kg/hr) 3050 (1.9) 1750 (3)   Blood  600   Chest Tube  190   Total Output 3050 2540   Net -2260.9 +924.4        Urine Occurrence 1 x       sodium chloride 20 mL/hr at 08/22/21 1500   [START ON 08/23/2021] sodium chloride     sodium chloride     albumin human 12.5 g (08/22/21 1354)    ceFAZolin (ANCEF) IV 2 g (08/22/21 1555)   dexmedetomidine (PRECEDEX) IV infusion 0.5 mcg/kg/hr (08/22/21 1500)   epinephrine     insulin 0.8 Units/hr (08/22/21 1500)   lactated ringers     lactated ringers     lactated ringers 20 mL/hr at 08/22/21 1500   magnesium sulfate 20 mL/hr at 08/22/21 1500   milrinone     niCARDipine     nitroGLYCERIN 40 mcg/min (08/22/21 1500)   norepinephrine (LEVOPHED) Adult infusion     phenylephrine (NEO-SYNEPHRINE) Adult infusion 20 mcg/min (08/22/21 1325)   potassium chloride     vancomycin       Lab Results  Component Value Date   WBC 8.4 08/22/2021   HGB 8.8 (L) 08/22/2021   HCT 27.7 (L) 08/22/2021   PLT 169 08/22/2021   GLUCOSE 133 (H) 08/22/2021   CHOL 180 08/16/2021   TRIG 89 08/16/2021   HDL 34 (L) 08/16/2021   LDLCALC 128 (H) 08/16/2021   ALT  20 08/16/2021   AST 64 (H) 08/16/2021   NA 139 08/22/2021   K 3.9 08/22/2021   CL 104 08/22/2021   CREATININE 1.00 08/22/2021   BUN 17 08/22/2021   CO2 26 08/22/2021   TSH 0.564 07/29/2019   PSA 2.25 05/17/2013   INR 1.4 (H) 08/22/2021   HGBA1C 8.0 (H) 08/15/2021   MICROALBUR 20 08/10/2015   1 adequate hemodynamics on nitro, will change to cardene, EKG NSSTW changes 2 weaning precidex for extubation 3 70-80 cc ct drainage/hr- monitor , H/H ok, expected ABLA 4 doing well early post- op        John Giovanni, PA-C

## 2021-08-22 NOTE — Op Note (Signed)
[address]       [place] [address]             [phone]                                          08/22/2021 Patient:  Robert Osborne Pre-Op Dx: NSTEMI   Three-vessel coronary artery disease   Hypertension   Diabetes mellitus Post-op Dx: Same Procedure: CABG X 4.  LIMA to diagonal branch.  Reverse saphenous vein graft to LAD.  Reverse saphenous vein graft to PDA, reverse saphenous vein graft to obtuse marginal. LAD endarterectomy. Endoscopic greater saphenous vein harvest on the right   Surgeon and Role:      * Henreitta Spittler, Lucile Crater, MD - Primary    *E. Barrett, PA-C- assisting  Anesthesia  general EBL: 500 ml Blood Administration: None Xclamp Time: 85 min Pump Time: 135 min  Drains: 19 F blake drain: R, L, mediastinal  Wires: None Counts: correct   Indications: 75 year old male presents with an NSTEMI.  He underwent left heart catheterization which showed severe three-vessel coronary artery disease.  He underwent PCI with angioplasty to the LAD with a good result.  On review of his echocardiogram he has preserved biventricular function and no significant valvular disease.  He also developed an acute kidney injury but this appears to have plateaued over the last day or so.  We discussed the risks and benefits of surgical revascularization and he is agreeable to proceed.   Findings: Very small vein.  Good sized LIMA.  Heavily calcified LAD.  An endarterectomy had to be performed.  Due to the quality of this arteriotomy I elected to place a vein graft over it since the diagonal was a better target.  Good quality obtuse marginal.  Heavily calcified PDA.  Operative Technique: All invasive lines were placed in pre-op holding.  After the risks, benefits and alternatives were thoroughly discussed, the patient was brought to the operative theatre.  Anesthesia was induced, and the patient was prepped and draped in normal sterile fashion.  An appropriate  surgical pause was performed, and pre-operative antibiotics were dosed accordingly.  We began with simultaneous incisions along the right leg for harvesting of the greater saphenous vein and the chest for the sternotomy.  In regards to the sternotomy, this was carried down with bovie cautery, and the sternum was divided with a reciprocating saw.  Meticulous hemostasis was obtained.  The left internal thoracic artery was exposed and harvested in in pedicled fashion.  The patient was systemically heparinized, and the artery was divided distally, and placed in a papaverine sponge.    The sternal elevator was removed, and a retractor was placed.  The pericardium was divided in the midline and fashioned into a cradle with pericardial stitches.   After we confirmed an appropriate ACT, the ascending aorta was cannulated in standard fashion.  The right atrial appendage was used for venous cannulation site.  Cardiopulmonary bypass was initiated, and the heart retractor was placed. The cross clamp was applied, and a dose of anterograde cardioplegia was given with good arrest of the heart.  We moved to the posterior wall of the heart, and found a good target on the PDA.  An arteriotomy was made, and the vein graft was anastomosed to it in an end to side fashion.  Next we exposed  the lateral wall, and found a good target on the obtuse marginal.  An end to side anastomosis with the vein graft was then created.  Next, we exposed the anterior wall of the heart and identified a good target on diagonal branch.   An arteriotomy was created.  The LIMA was anastomosed in an end to side fashion.  The LAD was heavily calcified.  We attempted to find a soft spot but upon entry it did not probe distally.  An endarterectomy was performed.  The vein graft was then anastomosed to the LAD at this location.  We began to re-warm, and a re-animation dose of cardioplegia was given.  The heart was de-aired, and the cross clamp was removed.   Meticulous hemostasis was obtained.    A partial occludding clamp was then placed on the ascending aorta, and we created an end to side anastomosis between it and the proximal vein grafts.  The proximal sites were marked with rings.  Hemostasis was obtained, and we separated from cardiopulmonary bypass without event.  The heparin was reversed with protamine.  Chest tubes and wires were placed, and the sternum was re-approximated with sternal wires.  The soft tissue and skin were re-approximated wth absorbable suture.    The patient tolerated the procedure without any immediate complications, and was transferred to the ICU in guarded condition.  Robert Osborne Robert Osborne

## 2021-08-22 NOTE — Brief Op Note (Signed)
08/15/2021 - 08/22/2021  1:17 PM  PATIENT:  Robert Osborne  75 y.o. male  PRE-OPERATIVE DIAGNOSIS:  CAD  POST-OPERATIVE DIAGNOSIS:  CAD  PROCEDURE:  Procedure(s):  CORONARY ARTERY BYPASS GRAFTING x 4 -LIMA to DIAGONAL -SVG to LAD with Endarterectomy -SVG to OM -SVG to PDA  ENDOVEIN HARVEST OF GREATER SAPHENOUS VEIN -Right Leg ( vein was of small size and poor quality) ( 60 min harvest/15 min prep) -Left Leg Explored  TRANSESOPHAGEAL ECHOCARDIOGRAM (TEE) (N/A)  APPLICATION OF CELL SAVER  SURGEON:  Surgeon(s) and Role:    * Lightfoot, Lucile Crater, MD - Primary  PHYSICIAN ASSISTANT: Ellwood Handler PA-C  ASSISTANTS: Ara Kussmaul RNFA   ANESTHESIA:   general  EBL:  600 mL   BLOOD ADMINISTERED:   CC CELLSAVER  DRAINS:  19 Blake Mediastinal Drains    LOCAL MEDICATIONS USED:  NONE  SPECIMEN:  No Specimen  DISPOSITION OF SPECIMEN:  N/A  COUNTS:  YES  TOURNIQUET:  * No tourniquets in log *  DICTATION: .Dragon Dictation  PLAN OF CARE: Admit to inpatient   PATIENT DISPOSITION:  ICU - intubated and hemodynamically stable.   Delay start of Pharmacological VTE agent (>24hrs) due to surgical blood loss or risk of bleeding: yes

## 2021-08-23 ENCOUNTER — Inpatient Hospital Stay (HOSPITAL_COMMUNITY): Payer: Medicare HMO

## 2021-08-23 ENCOUNTER — Encounter (HOSPITAL_COMMUNITY): Payer: Self-pay | Admitting: Thoracic Surgery (Cardiothoracic Vascular Surgery)

## 2021-08-23 DIAGNOSIS — E1169 Type 2 diabetes mellitus with other specified complication: Secondary | ICD-10-CM | POA: Diagnosis not present

## 2021-08-23 DIAGNOSIS — I248 Other forms of acute ischemic heart disease: Secondary | ICD-10-CM | POA: Diagnosis not present

## 2021-08-23 DIAGNOSIS — I2109 ST elevation (STEMI) myocardial infarction involving other coronary artery of anterior wall: Secondary | ICD-10-CM | POA: Diagnosis not present

## 2021-08-23 DIAGNOSIS — E1159 Type 2 diabetes mellitus with other circulatory complications: Secondary | ICD-10-CM | POA: Diagnosis not present

## 2021-08-23 DIAGNOSIS — N179 Acute kidney failure, unspecified: Secondary | ICD-10-CM | POA: Diagnosis not present

## 2021-08-23 DIAGNOSIS — Z951 Presence of aortocoronary bypass graft: Secondary | ICD-10-CM

## 2021-08-23 LAB — BASIC METABOLIC PANEL WITH GFR
Anion gap: 10 (ref 5–15)
BUN: 17 mg/dL (ref 8–23)
CO2: 21 mmol/L — ABNORMAL LOW (ref 22–32)
Calcium: 8.5 mg/dL — ABNORMAL LOW (ref 8.9–10.3)
Chloride: 105 mmol/L (ref 98–111)
Creatinine, Ser: 1.38 mg/dL — ABNORMAL HIGH (ref 0.61–1.24)
GFR, Estimated: 53 mL/min — ABNORMAL LOW
Glucose, Bld: 158 mg/dL — ABNORMAL HIGH (ref 70–99)
Potassium: 4.2 mmol/L (ref 3.5–5.1)
Sodium: 136 mmol/L (ref 135–145)

## 2021-08-23 LAB — GLUCOSE, CAPILLARY
Glucose-Capillary: 122 mg/dL — ABNORMAL HIGH (ref 70–99)
Glucose-Capillary: 124 mg/dL — ABNORMAL HIGH (ref 70–99)
Glucose-Capillary: 124 mg/dL — ABNORMAL HIGH (ref 70–99)
Glucose-Capillary: 125 mg/dL — ABNORMAL HIGH (ref 70–99)
Glucose-Capillary: 125 mg/dL — ABNORMAL HIGH (ref 70–99)
Glucose-Capillary: 126 mg/dL — ABNORMAL HIGH (ref 70–99)
Glucose-Capillary: 126 mg/dL — ABNORMAL HIGH (ref 70–99)
Glucose-Capillary: 128 mg/dL — ABNORMAL HIGH (ref 70–99)
Glucose-Capillary: 144 mg/dL — ABNORMAL HIGH (ref 70–99)
Glucose-Capillary: 146 mg/dL — ABNORMAL HIGH (ref 70–99)
Glucose-Capillary: 184 mg/dL — ABNORMAL HIGH (ref 70–99)
Glucose-Capillary: 191 mg/dL — ABNORMAL HIGH (ref 70–99)
Glucose-Capillary: 68 mg/dL — ABNORMAL LOW (ref 70–99)

## 2021-08-23 LAB — CBC
HCT: 32.5 % — ABNORMAL LOW (ref 39.0–52.0)
HCT: 33.6 % — ABNORMAL LOW (ref 39.0–52.0)
Hemoglobin: 10.4 g/dL — ABNORMAL LOW (ref 13.0–17.0)
Hemoglobin: 10.8 g/dL — ABNORMAL LOW (ref 13.0–17.0)
MCH: 28.2 pg (ref 26.0–34.0)
MCH: 28.7 pg (ref 26.0–34.0)
MCHC: 32 g/dL (ref 30.0–36.0)
MCHC: 32.1 g/dL (ref 30.0–36.0)
MCV: 87.7 fL (ref 80.0–100.0)
MCV: 89.5 fL (ref 80.0–100.0)
Platelets: 209 K/uL (ref 150–400)
Platelets: 237 10*3/uL (ref 150–400)
RBC: 3.63 MIL/uL — ABNORMAL LOW (ref 4.22–5.81)
RBC: 3.83 MIL/uL — ABNORMAL LOW (ref 4.22–5.81)
RDW: 14.8 % (ref 11.5–15.5)
RDW: 15 % (ref 11.5–15.5)
WBC: 15.1 K/uL — ABNORMAL HIGH (ref 4.0–10.5)
WBC: 15.4 10*3/uL — ABNORMAL HIGH (ref 4.0–10.5)
nRBC: 0 % (ref 0.0–0.2)
nRBC: 0 % (ref 0.0–0.2)

## 2021-08-23 LAB — TROPONIN I (HIGH SENSITIVITY)
Troponin I (High Sensitivity): 317 ng/L (ref <18)
Troponin I (High Sensitivity): 341 ng/L (ref <18)
Troponin I (High Sensitivity): 277 ng/L (ref <18)
Troponin I (High Sensitivity): 343 ng/L (ref <18)

## 2021-08-23 LAB — MAGNESIUM
Magnesium: 2.8 mg/dL — ABNORMAL HIGH (ref 1.7–2.4)
Magnesium: 2.3 mg/dL (ref 1.7–2.4)

## 2021-08-23 LAB — BASIC METABOLIC PANEL
Anion gap: 10 (ref 5–15)
BUN: 20 mg/dL (ref 8–23)
CO2: 22 mmol/L (ref 22–32)
Calcium: 8.1 mg/dL — ABNORMAL LOW (ref 8.9–10.3)
Chloride: 101 mmol/L (ref 98–111)
Creatinine, Ser: 1.5 mg/dL — ABNORMAL HIGH (ref 0.61–1.24)
GFR, Estimated: 48 mL/min — ABNORMAL LOW (ref >60)
Glucose, Bld: 181 mg/dL — ABNORMAL HIGH (ref 70–99)
Potassium: 4.2 mmol/L (ref 3.5–5.1)
Sodium: 133 mmol/L — ABNORMAL LOW (ref 135–145)

## 2021-08-23 LAB — ECHOCARDIOGRAM LIMITED
Height: 67 in
Weight: 2409.19 oz

## 2021-08-23 IMAGING — DX DG ABD PORTABLE 1V
1 series · 1 of 1 positions shown · non-contrast
Comparison: Chest x-ray [DATE]

CLINICAL DATA: NG tube placement

EXAM:
PORTABLE ABDOMEN - 1 VIEW

[abdomen supine]
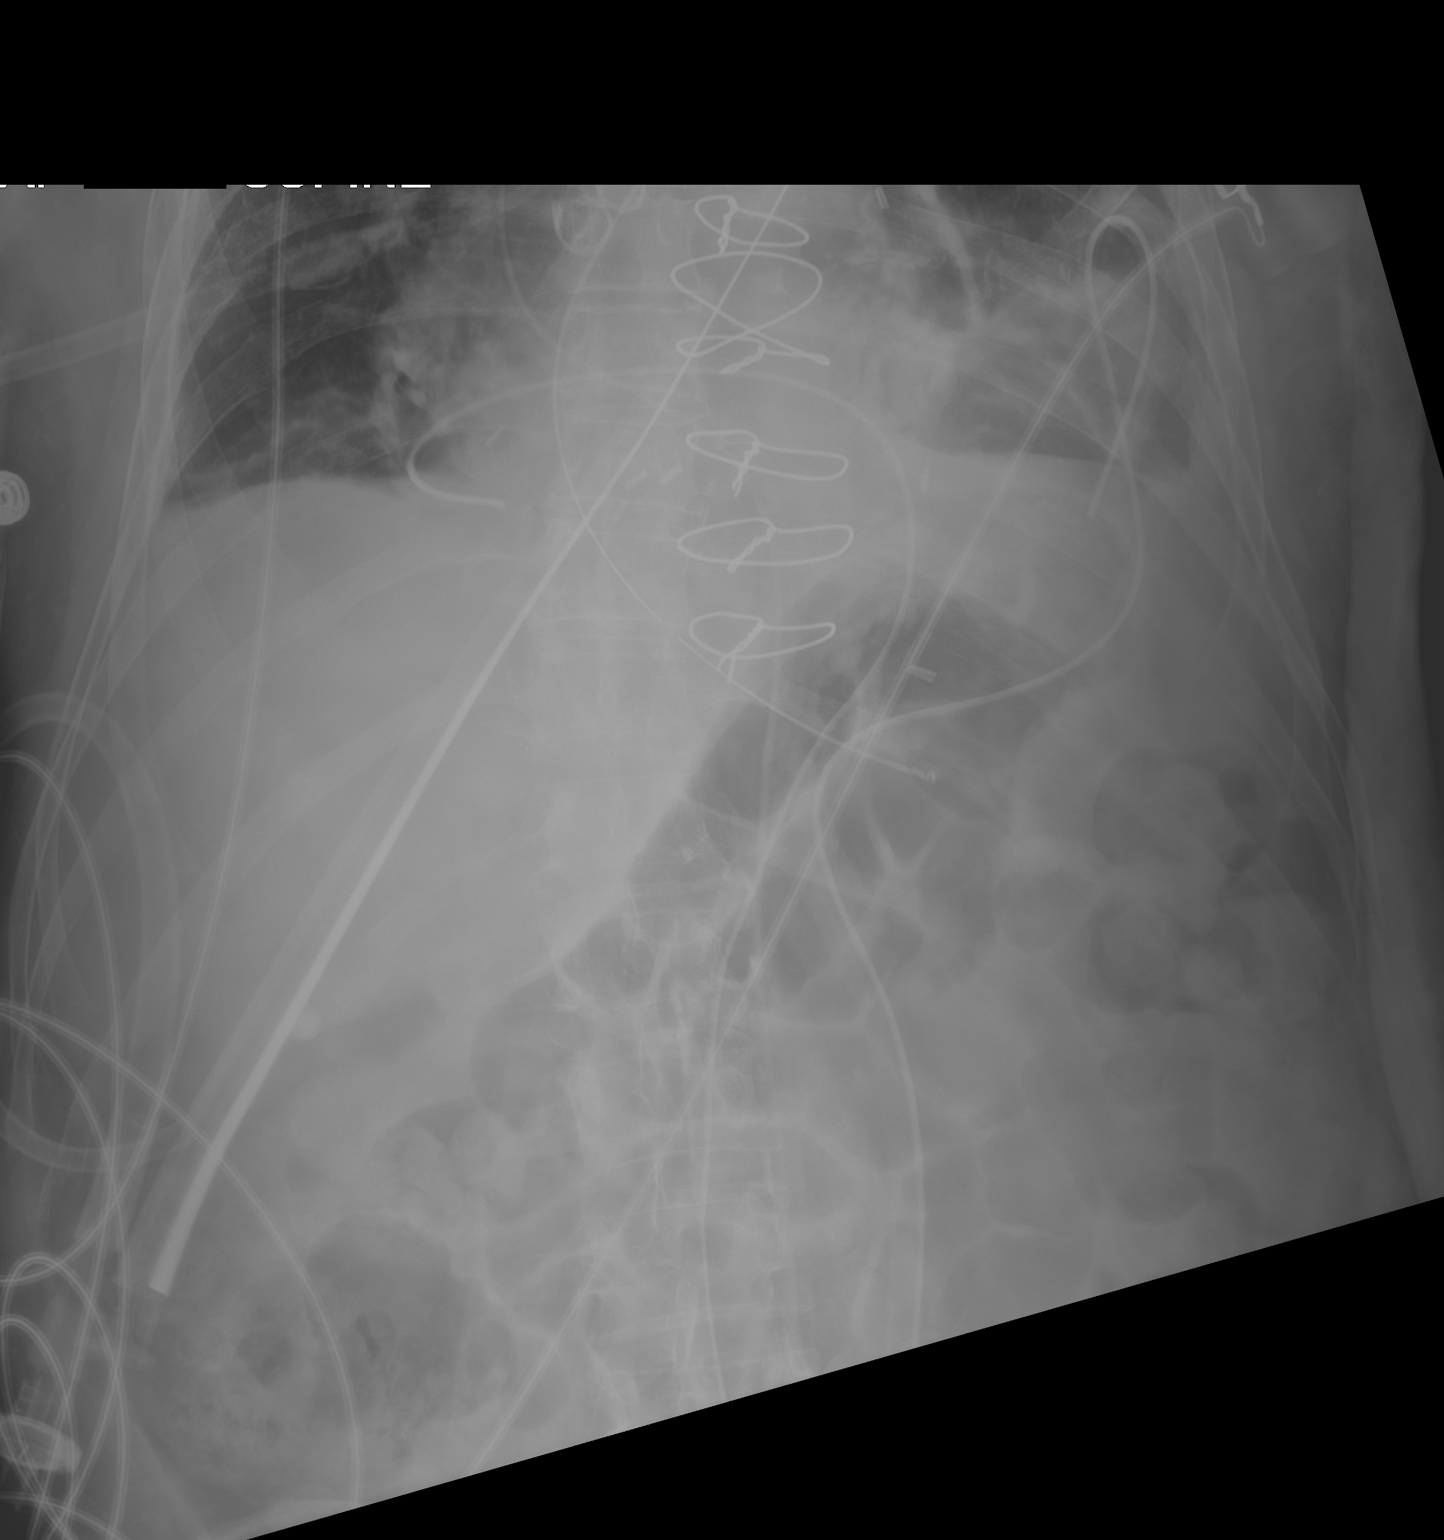

[1 of 1 positions shown; findings below may reference images not displayed]

FINDINGS: Partially visualized sternotomy. Right-sided vascular catheter tip
over the distal SVC. Esophageal tube tip overlies the proximal
stomach, side-port slightly distal to GE junction. Drainage
catheters over the mediastinum and chest. Pleural effusion on the
left with airspace disease.
IMPRESSION: 1. Esophageal tube tip overlies the proximal stomach, side-port just
beyond GE junction region.

## 2021-08-23 IMAGING — DX DG CHEST 1V PORT
1 series · 1 of 1 positions shown · non-contrast
Comparison: [DATE]

CLINICAL DATA: Just 2

EXAM:
PORTABLE CHEST 1 VIEW

[chest ap]
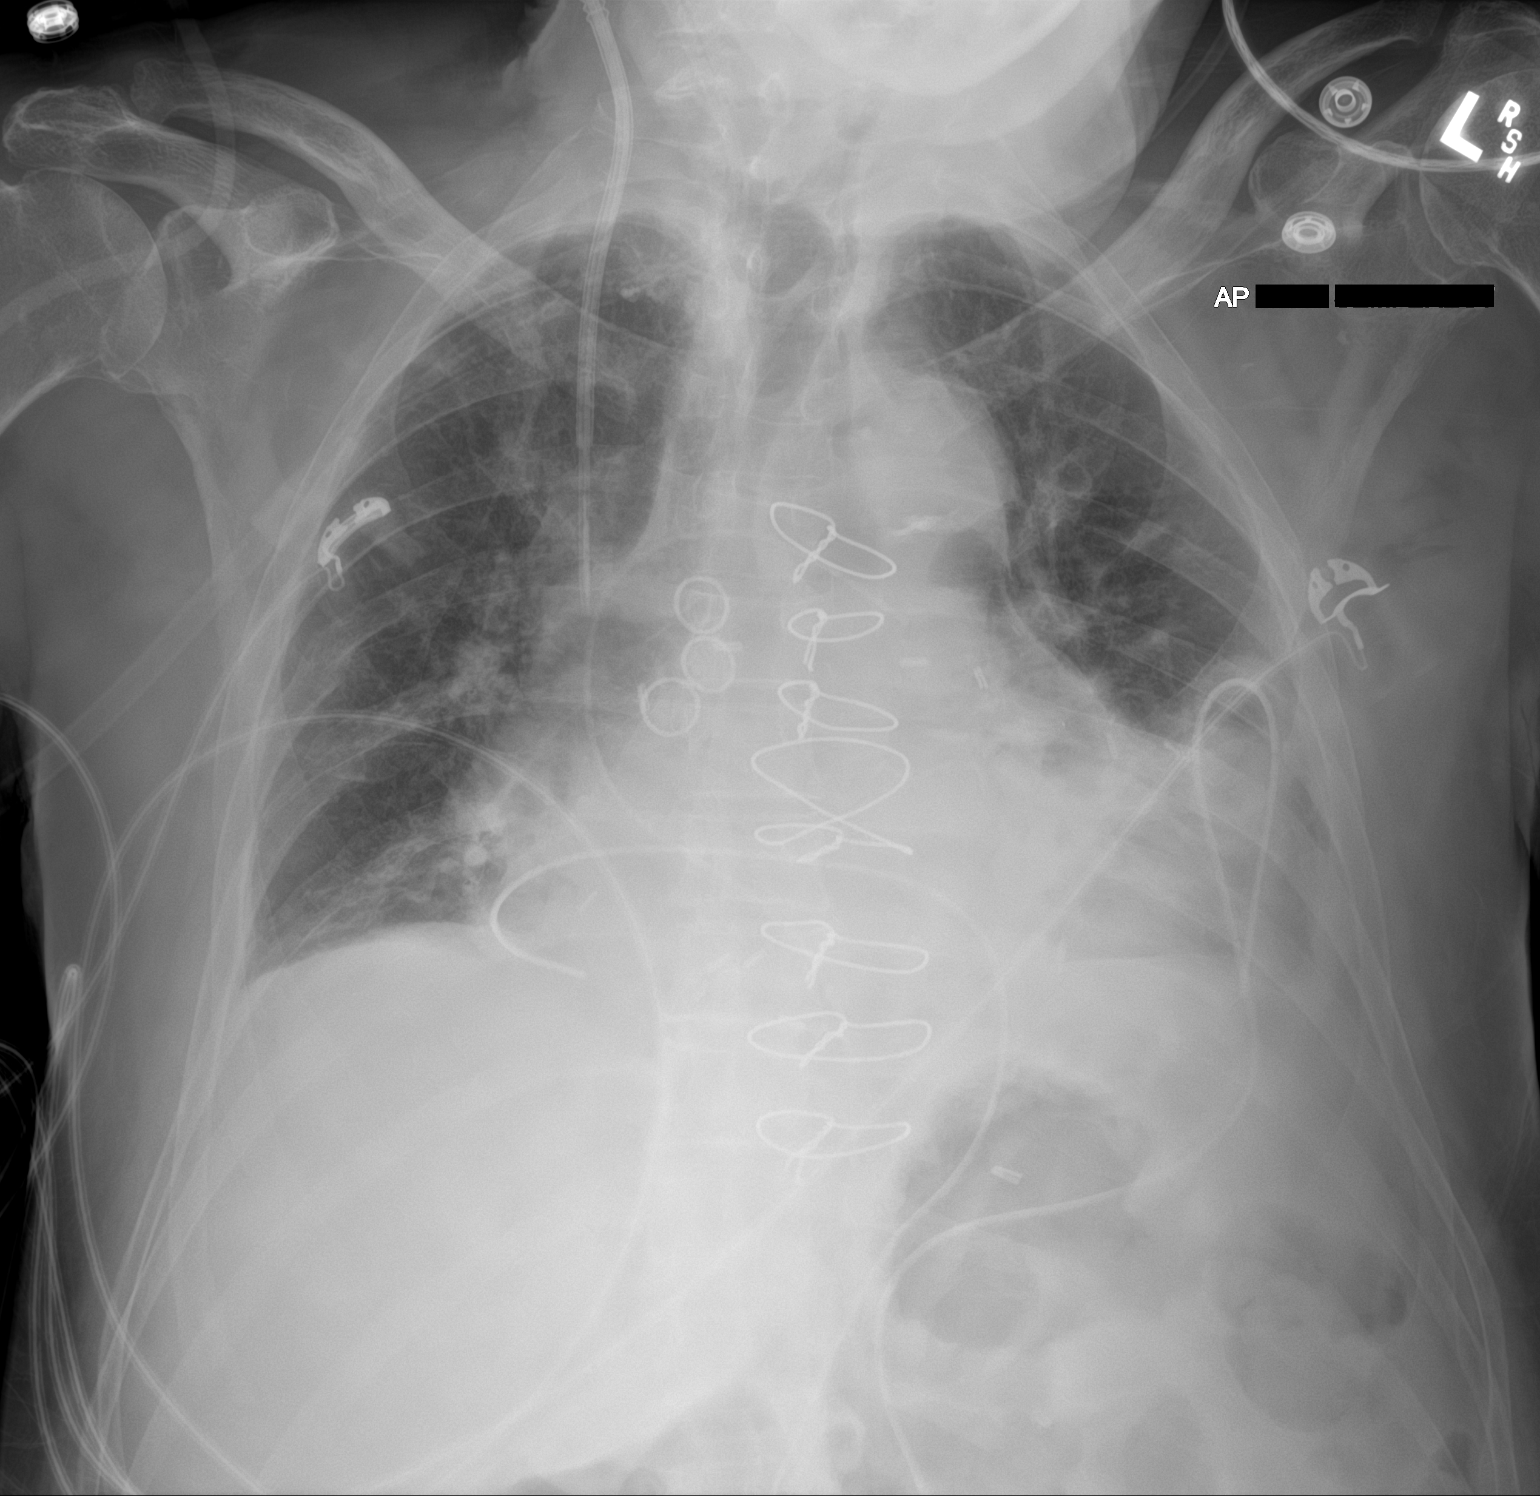

[1 of 1 positions shown; findings below may reference images not displayed]

FINDINGS: Interval extubation and removal of enteric tube. Unchanged position
of right IJ line mediastinal drains and left-sided chest tube.

Cardiac and mediastinal contours are unchanged post median
sternotomy and CABG. Unchanged bilateral heterogeneous opacities,
likely combination of pulmonary edema and atelectasis. Slightly
increased size of trace left pleural effusion. No evidence of
pneumothorax.
IMPRESSION: Interval extubation and removal of enteric tube.

Slightly increased size of trace left pleural effusion.

## 2021-08-23 MED ORDER — METOPROLOL TARTRATE 25 MG PO TABS
25.0000 mg | ORAL_TABLET | Freq: Two times a day (BID) | ORAL | Status: DC
  Administered 2021-08-23 – 2021-08-26 (×7): 25 mg via ORAL
  Filled 2021-08-23 (×7): qty 1

## 2021-08-23 MED ORDER — NITROGLYCERIN IN D5W 200-5 MCG/ML-% IV SOLN
0.0000 ug/min | INTRAVENOUS | Status: DC
  Filled 2021-08-23: qty 250

## 2021-08-23 MED ORDER — CLOPIDOGREL BISULFATE 75 MG PO TABS
75.0000 mg | ORAL_TABLET | Freq: Every day | ORAL | Status: DC
  Administered 2021-08-23 – 2021-08-30 (×8): 75 mg via ORAL
  Filled 2021-08-23 (×8): qty 1

## 2021-08-23 MED ORDER — INSULIN ASPART 100 UNIT/ML IJ SOLN
0.0000 [IU] | Freq: Three times a day (TID) | INTRAMUSCULAR | Status: DC
Start: 2021-08-23 — End: 2021-08-24
  Administered 2021-08-23: 3 [IU] via SUBCUTANEOUS
  Administered 2021-08-24: 8 [IU] via SUBCUTANEOUS
  Administered 2021-08-24: 11 [IU] via SUBCUTANEOUS

## 2021-08-23 MED ORDER — NITROGLYCERIN 0.4 MG SL SUBL: 0.4000 mg | SUBLINGUAL_TABLET | SUBLINGUAL | Status: DC | PRN

## 2021-08-23 MED ORDER — NITROGLYCERIN IN D5W 200-5 MCG/ML-% IV SOLN
INTRAVENOUS | Status: AC
  Filled 2021-08-23: qty 250

## 2021-08-23 MED ORDER — OLANZAPINE 5 MG PO TBDP
10.0000 mg | ORAL_TABLET | Freq: Every day | ORAL | Status: DC
  Administered 2021-08-23 – 2021-08-29 (×7): 10 mg via ORAL
  Filled 2021-08-23 (×8): qty 2

## 2021-08-23 MED ORDER — INSULIN ASPART 100 UNIT/ML IJ SOLN: 0.0000 [IU] | INTRAMUSCULAR | Status: DC

## 2021-08-23 MED ORDER — NITROGLYCERIN IN D5W 200-5 MCG/ML-% IV SOLN
5.0000 ug/min | INTRAVENOUS | Status: AC
  Administered 2021-08-23: 5 ug/min via INTRAVENOUS

## 2021-08-23 MED ORDER — FUROSEMIDE 10 MG/ML IJ SOLN
40.0000 mg | Freq: Once | INTRAMUSCULAR | Status: AC
  Administered 2021-08-23: 40 mg via INTRAVENOUS
  Filled 2021-08-23: qty 4

## 2021-08-23 MED ORDER — OSMOLITE 1.5 CAL PO LIQD
1000.0000 mL | ORAL | Status: DC
  Administered 2021-08-24: 1000 mL

## 2021-08-23 MED ORDER — INSULIN ASPART 100 UNIT/ML IJ SOLN
0.0000 [IU] | Freq: Every day | INTRAMUSCULAR | Status: DC
Start: 2021-08-23 — End: 2021-08-24

## 2021-08-23 MED ORDER — PROSOURCE TF PO LIQD
45.0000 mL | Freq: Every day | ORAL | Status: DC
  Administered 2021-08-24 – 2021-08-26 (×2): 45 mL
  Filled 2021-08-23 (×2): qty 45

## 2021-08-23 MED ORDER — ASPIRIN EC 81 MG PO TBEC
81.0000 mg | DELAYED_RELEASE_TABLET | Freq: Every day | ORAL | Status: DC
  Administered 2021-08-24 – 2021-08-30 (×7): 81 mg via ORAL
  Filled 2021-08-23 (×7): qty 1

## 2021-08-23 MED ORDER — NICARDIPINE HCL IN NACL 40-0.83 MG/200ML-% IV SOLN
3.0000 mg/h | INTRAVENOUS | Status: DC
  Administered 2021-08-23: 3 mg/h via INTRAVENOUS
  Filled 2021-08-23 (×2): qty 200

## 2021-08-23 MED ORDER — INSULIN DETEMIR 100 UNIT/ML ~~LOC~~ SOLN
10.0000 [IU] | Freq: Two times a day (BID) | SUBCUTANEOUS | Status: DC
  Filled 2021-08-23 (×2): qty 0.1

## 2021-08-23 MED FILL — Heparin Sodium (Porcine) Inj 1000 Unit/ML: INTRAMUSCULAR | Qty: 20 | Status: AC

## 2021-08-23 MED FILL — Lidocaine HCl Local Preservative Free (PF) Inj 1%: INTRAMUSCULAR | Qty: 5 | Status: AC

## 2021-08-23 MED FILL — Potassium Chloride Inj 2 mEq/ML: INTRAVENOUS | Qty: 40 | Status: AC

## 2021-08-23 MED FILL — Calcium Chloride Inj 10%: INTRAVENOUS | Qty: 10 | Status: AC

## 2021-08-23 MED FILL — Sodium Bicarbonate IV Soln 8.4%: INTRAVENOUS | Qty: 50 | Status: AC

## 2021-08-23 MED FILL — Albumin, Human Inj 5%: INTRAVENOUS | Qty: 250 | Status: AC

## 2021-08-23 MED FILL — Sodium Chloride IV Soln 0.9%: INTRAVENOUS | Qty: 2000 | Status: AC

## 2021-08-23 MED FILL — Mannitol IV Soln 20%: INTRAVENOUS | Qty: 500 | Status: AC

## 2021-08-23 MED FILL — Heparin Sodium (Porcine) Inj 1000 Unit/ML: INTRAMUSCULAR | Qty: 30 | Status: AC

## 2021-08-23 MED FILL — Lidocaine HCl Local Preservative Free (PF) Inj 2%: INTRAMUSCULAR | Qty: 15 | Status: AC

## 2021-08-23 MED FILL — Electrolyte-R (PH 7.4) Solution: INTRAVENOUS | Qty: 5000 | Status: AC

## 2021-08-23 NOTE — Discharge Summary (Addendum)
[address]       [place] [address]             [phone]    Physician Discharge Summary  Patient ID: Robert Osborne MRN: [id number] DOB/AGE: January 29, 1946 75 y.o.  Admit date: 08/15/2021 Discharge date: 08/30/2021  Admission Diagnoses:  Patient Active Problem List   Diagnosis Date Noted   Acute anterior wall MI (HCC) 08/16/2021   NSTEMI (non-ST elevated myocardial infarction) (HCC)    Chest pain 08/15/2021   Right upper lobe pulmonary nodule 08/15/2021   Hypoalbuminemia due to protein-calorie malnutrition (HCC) 08/15/2021   AKI (acute kidney injury) (HCC) 08/15/2021   Hyperglycemia due to diabetes mellitus (HCC) 08/15/2021   BPH (benign prostatic hyperplasia) 08/15/2021   Generalized weakness    Neurological deficit present    Facial droop 08/17/2019   Hx of bladder cancer 07/29/2019   Abnormal stress test 08/11/2018   PVC's (premature ventricular contractions) 08/11/2018   Hypokalemia 11/27/2017   Hypomagnesemia 11/27/2017   Major frontotemporal neurocognitive disorder, probable, with behavioral disturbance 11/27/2017   Erectile dysfunction 10/24/2016   Bladder cancer (HCC) 08/18/2013   Diabetes mellitus (HCC)    Hypertension associated with diabetes (HCC)    Vitamin D deficiency 05/17/2013   Hyperlipidemia associated with type 2 diabetes mellitus (HCC) 05/17/2013   Discharge Diagnoses:  Patient Active Problem List   Diagnosis Date Noted   S/P CABG x 4 08/23/2021   Acute anterior wall MI (HCC) 08/16/2021   NSTEMI (non-ST elevated myocardial infarction) (HCC)    Chest pain 08/15/2021   Right upper lobe pulmonary nodule 08/15/2021   Hypoalbuminemia due to protein-calorie malnutrition (HCC) 08/15/2021   AKI (acute kidney injury) (HCC) 08/15/2021   Hyperglycemia due to diabetes mellitus (HCC) 08/15/2021   BPH (benign prostatic hyperplasia) 08/15/2021   Generalized weakness    Neurological deficit present    Facial droop 08/17/2019   Hx of  bladder cancer 07/29/2019   Abnormal stress test 08/11/2018   PVC's (premature ventricular contractions) 08/11/2018   Hypokalemia 11/27/2017   Hypomagnesemia 11/27/2017   Major frontotemporal neurocognitive disorder, probable, with behavioral disturbance 11/27/2017   Erectile dysfunction 10/24/2016   Bladder cancer (HCC) 08/18/2013   Diabetes mellitus (HCC)    Hypertension associated with diabetes (HCC)    Vitamin D deficiency 05/17/2013   Hyperlipidemia associated with type 2 diabetes mellitus (HCC) 05/17/2013   Discharged Condition: good  History of Present Illness:  Robert Osborne is a 75 year old gentleman that presented to [facility] emergency department on 08/15/2021 with exertional anginal symptoms.  He states that he has had a 2 to 63-month history of worsening chest pain, but on this day the pain was much worse.  He was ruled in for an NSTEMI and was transferred to [facility] for further evaluation.    Hospital Course:   He underwent a left heart cath which showed three-vessel coronary artery disease along with an occlusion to the LAD that required angioplasty.  It was felt the patient would benefit from coronary artery bypass grafting and Cardiothoracic surgery consultation was requested.  He was evaluated by Dr. Cliffton Asters who was in agreement the patient would benefit from coronary bypass grafting.  The patient developed and acute kidney injury post cath and it was felt he should allow this to recover prior to proceeding with coronary bypass grafting procedure.  The risks and benefits of the procedure were explained to the patient and he was agreeable to proceed.  The patient was  taken to the operating room on 08/22/2021.  He was taken to the operating room and underwent CABG x 4 utilizing LIMA to Diagonal, SVG to LAD with Endarterectomy, SVG to OM, and SVG to PDA.  He also underwent Endoscopic harvest from his greater saphenous vein from his right leg.  He tolerated the procedure without  difficulty and was taken to the SICU in stable condition.  The patient was extubated the evening of surgery.  His post operative EKG was normal, however repeat EKG POD #1 showed some ST changes.  This could have possibly been due to pericarditis, however Echocardiogram was obtained.  This showed preserved LV function of 50-55%.  There was hypokinesis in the [place].  The patient required endarterectomy in the operating room of his LAD and was started on Plavix.  He was hypertensive and was treated with Cardene which was weaned as hemodynamics allowed.  Once off his drips his arterial line was removed without difficulty.  He was volume overloaded and started on IV diuretics.  The patient developed worsening chest pain, with concerning ST changes from morning EKG his Cardene drip was discontinued and he was started on NTG.  A troponin level was obtained and was 317 with repeat level being 341.  Cardiology was consulted and evaluated the patient.  They felt the patient would not be a candidate for further intervention and ischemia would need to be managed medically .  Patients oral intake was poor.  SLP evaluation was obtained and patient was felt to be at increased risk of aspiration and was kept NPO.  He underwent placement of NG tube and tube feedings were initiated per dietary recommendations.  The patient's chest pain resolved and he was transitioned off NTG drip to Imdur and Lopressor.    He developed a fever and was started on empiric Maxipime for empiric HCAP coverage.  Per Dr. Kipp Brood, this was stopped on 09/12. Patient pulled NGT out on 09/11. Nurse replaced NGT. KUB done showed possible ileus. Patient accidentally removed NGT on 09/12. Repeat SLP evaluation was performed and patient is able to be given clear liquids. He had a bowel movement. Right IJ line was also removed on 09/13.  He continued to participate with SLP.  They have advanced patient to a regular diet.  He remains tachycardic and hypertensive  and his Lopressor dose was titrated. He is weakened and deconditioned and PT/OT consults were requested.  Both of which recommended home health which has been arranged. He was felt surgically stable for transfer from the ICU to 4E for further convalescence on 09/13.  The patient remained clinically stable.  His blood pressure and heart rate improved with titration of his Lopressor.  He is ambulating with assistance.  His surgical incisions are healing without evidence of infection.  He is medically stable for discharge home today.  Consults: cardiology  Significant Diagnostic Studies: angiography:     Ost Cx to Prox Cx lesion is 75% stenosed.   2nd Mrg lesion is 80% stenosed.   1st Mrg lesion is 90% stenosed.   Ost LAD to Prox LAD lesion is 25% stenosed.   1st Diag lesion is 90% stenosed.   Mid LAD lesion is 90% stenosed.   Dist LAD lesion is 100% stenosed.  Flow restored with a wire.  Unable to advance balloon.   Mid RCA lesion is 25% stenosed.   RPAV lesion is 90% stenosed.   Balloon angioplasty was performed using a BALLOON SAPPHIRE 1.0X10.   Post intervention, there  is a 95% residual stenosis.   There is mild to moderate left ventricular systolic dysfunction.   LV end diastolic pressure is normal.   The left ventricular ejection fraction is 45-50% by visual estimate.   There is no aortic valve stenosis.   Severe disease in the proximal to mid LAD, and ostial circumflex.Marland Kitchen  Heavily calcified lesion at the origin of a large first diagonal.  Culprit for today's presentation was an occlusion in the mid LAD, past the areas of heavy calcification.  This was successfully wired and flow restored, but a balloon could not be advanced through the heavily calcific disease more proximally.     Will obtain cardiac surgery consult.  If surgery not an option, will have to see if there is an appropriate high risk PCI strategy.   Treatments: surgery:   08/22/2021 Patient:  [name] Pre-Op Dx:  NSTEMI                         Three-vessel coronary artery disease                         Hypertension                         Diabetes mellitus Post-op Dx: Same Procedure: CABG X 4.  LIMA to diagonal branch.  Reverse saphenous vein graft to LAD.  Reverse saphenous vein graft to PDA, reverse saphenous vein graft to obtuse marginal. LAD endarterectomy. Endoscopic greater saphenous vein harvest on the right     Surgeon and Role:      * Lightfoot, Lucile Crater, MD - Primary    *E. Graciemae Delisle, PA-C- assisting   Discharge Exam: Blood pressure 134/83, pulse 82, temperature (!) 97.5 F (36.4 C), temperature source Oral, resp. rate 20, height $RemoveBe'5\' 7"'eVNzYgNjK$  (1.702 m), weight 66.9 kg, SpO2 97 %.  General appearance: alert, cooperative, and no distress Heart: regular rate and rhythm Lungs: clear to auscultation bilaterally Abdomen: soft, non-tender; bowel sounds normal; no masses,  no organomegaly Extremities: extremities normal, atraumatic, no cyanosis or edema Wound: clean and dry   Discharge Medications:  The patient has been discharged on:   1.Beta Blocker:  Yes [  x ]                              No   [   ]                              If No, reason:  2.Ace Inhibitor/ARB: Yes [   ]                                     No  [  x  ]                                     If No, reason:AKI  3.Statin:   Yes [  x ]                  No  [   ]  If No, reason:  4.Ecasa:  Yes  [  x ]                  No   [   ]                  If No, reason:  Patient had ACS upon admission:YES  Plavix/P2Y12 inhibitor: Yes [ x  ]                                      No  [   ]     Discharge Instructions     AMB Referral to Cardiac Rehabilitation - Phase II   Complete by: As directed    Diagnosis: NSTEMI   After initial evaluation and assessments completed: Virtual Based Care may be provided alone or in conjunction with Phase 2 Cardiac Rehab based on patient barriers.: Yes       Allergies as of 08/30/2021       Reactions   Enalapril Cough        Medication List     STOP taking these medications    amLODipine 10 MG tablet Commonly known as: NORVASC   aspirin 325 MG tablet Replaced by: aspirin 81 MG EC tablet   metoprolol succinate 25 MG 24 hr tablet Commonly known as: TOPROL-XL   sulfamethoxazole-trimethoprim 800-160 MG tablet Commonly known as: BACTRIM DS       TAKE these medications    aspirin 81 MG EC tablet Take 1 tablet (81 mg total) by mouth daily. Swallow whole. Replaces: aspirin 325 MG tablet   blood glucose meter kit and supplies Dispense based on patient and insurance preference. Use up to four times daily as directed. (FOR ICD-10 E10.9, E11.9).   blood glucose meter kit and supplies Use up to four times daily as directed. dx E11.9   blood glucose meter kit and supplies Kit Dispense based on patient and insurance preference. Use up to twice a daily as directed. (FOR ICD-10 - type 2 DM uncontrolled E11.9)   clopidogrel 75 MG tablet Commonly known as: PLAVIX Take 1 tablet (75 mg total) by mouth daily.   dapagliflozin propanediol 10 MG Tabs tablet Commonly known as: FARXIGA Take 1 tablet (10 mg total) by mouth daily.   furosemide 40 MG tablet Commonly known as: LASIX Take 1 tablet (40 mg total) by mouth daily.   glucose blood test strip Use as instructed   insulin aspart 100 UNIT/ML FlexPen Commonly known as: NOVOLOG Inject 9 Units into the skin 2 (two) times daily with a meal.   isosorbide mononitrate 30 MG 24 hr tablet Commonly known as: IMDUR Take 1 tablet (30 mg total) by mouth daily.   metoprolol tartrate 50 MG tablet Commonly known as: LOPRESSOR Take 1 tablet (50 mg total) by mouth 2 (two) times daily.   nitroGLYCERIN 0.4 MG SL tablet Commonly known as: NITROSTAT Place 0.4 mg under the tongue every 5 (five) minutes as needed for chest pain.   OLANZapine 10 MG tablet Commonly known as: ZYPREXA Take 10  mg by mouth at bedtime.   ONE TOUCH ULTRA 2 w/Device Kit USE TO CHECK BLOOD SUGAR UP TO 4 TIMES DAILY AS DIRECTED   OneTouch Delica Plus [id number] Misc USE TO CHECK BLOOD SUGAR UP TO 4 TIMES A DAY AS DIRECTED   oxyCODONE 5 MG immediate release tablet Commonly known as: Oxy IR/ROXICODONE  Take 1-2 tablets (5-10 mg total) by mouth every 4 (four) hours as needed for severe pain.   Pen Needles 32G X 4 MM Misc 100 each by Does not apply route 4 (four) times daily. E10.9   potassium chloride SA 20 MEQ tablet Commonly known as: KLOR-CON Take 1 tablet (20 mEq total) by mouth daily.   rosuvastatin 40 MG tablet Commonly known as: CRESTOR Take 1 tablet (40 mg total) by mouth daily.   tamsulosin 0.4 MG Caps capsule Commonly known as: FLOMAX Take 0.4 mg by mouth daily.   Tyler Aas FlexTouch 100 UNIT/ML FlexTouch Pen Generic drug: insulin degludec Inject 0.05 mLs (5 Units total) into the skin daily. What changed: how much to take               Durable Medical Equipment  (From admission, onward)           Start     Ordered   08/28/21 0800  For home use only DME Walker rolling  Once       Question Answer Comment  Walker: With [place]   Patient needs a walker to treat with the following condition Physical deconditioning   Patient needs a walker to treat with the following condition S/P CABG x 4      08/28/21 0800   08/28/21 0800  For home use only DME 3 n 1  Once        08/28/21 0800            Follow-up Information     Almyra Deforest, PA Follow up on 09/10/2021.   Specialties: Cardiology, Radiology Why: Appointment is at 10:15 Contact information: [place] [address] [phone]         Lajuana Matte, MD Follow up on 09/06/2021.   Specialty: Cardiothoracic Surgery Why: Dr. Kipp Brood will call you at 2:50 pm. Please do NOT go to the office. Contact information: [address] [place]  [address] [facility] [place] Follow up.   Specialty: Home Health Services Why: Physical/Occupational Therapy, Aide-office to call with visit times. Contact information: [address] [place] [address] [phone]         [facility] Follow up.   Why: rolling walker and 3n1 arranged- to be delivered to room prior to discharge Contact information: [place] [address] [phone]                 Signed: Ellwood Handler, PA-C 08/30/2021, 7:30 AM

## 2021-08-23 NOTE — Progress Notes (Addendum)
[facility] DAILY ICU PROGRESS NOTE                   [address]            [place] [address]          [phone]   1 Day Post-Op Procedure(s) (LRB): CORONARY ARTERY BYPASS GRAFTING (CABG), ON PUMP, TIMES FOUR , USING LEFT INTERNAL MAMMARY ARTERY AND ENDOSCOPICALLY HARVESTED RIGHT GREATER SAPHENOUS VEIN (N/A) TRANSESOPHAGEAL ECHOCARDIOGRAM (TEE) (N/A) APPLICATION OF CELL SAVER ENDOVEIN HARVEST OF GREATER SAPHENOUS VEIN  Total Length of Stay:  LOS: 7 days   Subjective: Denies pain, no specific c/o  Objective: Vital signs in last 24 hours: Temp:  [97.9 F (36.6 C)-100.9 F (38.3 C)] 100.6 F (38.1 C) (09/09 0700) Pulse Rate:  [63-104] 80 (09/09 0700) Cardiac Rhythm: Normal sinus rhythm (09/09 0400) Resp:  [13-32] 14 (09/09 0700) BP: (89-150)/(47-94) 123/63 (09/09 0700) SpO2:  [94 %-100 %] 95 % (09/09 0700) Arterial Line BP: (67-156)/(43-115) 113/49 (09/09 0700) FiO2 (%):  [40 %-50 %] 40 % (09/08 1732) Weight:  [68.3 kg] 68.3 kg (09/09 0500)  Filed Weights   08/21/21 0600 08/22/21 0500 08/23/21 0500  Weight: 66.8 kg 65.7 kg 68.3 kg    Weight change: 2.6 kg   Hemodynamic parameters for last 24 hours: CVP:  [0 mmHg-13 mmHg] 6 mmHg  Intake/Output from previous day: 09/08 0701 - 09/09 0700 In: 5406.9 [I.V.:3258.2; Blood:350; IV Piggyback:1798.8] Out: 4988 [Urine:3908; Blood:600; Chest Tube:480]  Intake/Output this shift: No intake/output data recorded.  Current Meds: Scheduled Meds:  acetaminophen  1,000 mg Oral Q6H   Or   acetaminophen (TYLENOL) oral liquid 160 mg/5 mL  1,000 mg Per Tube Q6H   aspirin EC  81 mg Oral Daily   bisacodyl  10 mg Oral Daily   Or   bisacodyl  10 mg Rectal Daily   chlorhexidine  15 mL Mouth/Throat NOW   chlorhexidine gluconate (MEDLINE KIT)  15 mL Mouth Rinse BID   Chlorhexidine Gluconate Cloth  6 each Topical Daily   clopidogrel  75 mg Oral Daily   docusate sodium  200 mg Oral Daily   famotidine (PEPCID) IV  20 mg  Intravenous Q12H   furosemide  40 mg Intravenous Once   insulin aspart  0-24 Units Subcutaneous Q4H   mouth rinse  15 mL Mouth Rinse BID   metoCLOPramide (REGLAN) injection  10 mg Intravenous Q6H   metoprolol tartrate  25 mg Oral BID   OLANZapine  10 mg Oral QHS   [START ON 08/24/2021] pantoprazole  40 mg Oral Daily   rosuvastatin  40 mg Oral Daily   sodium chloride flush  3 mL Intravenous Q12H   tamsulosin  0.4 mg Oral Daily   Continuous Infusions:  sodium chloride 20 mL/hr at 08/23/21 0700   sodium chloride     sodium chloride     albumin human Stopped (08/22/21 1900)    ceFAZolin (ANCEF) IV Stopped (08/23/21 0541)   insulin 1.4 Units/hr (08/23/21 0700)   lactated ringers     lactated ringers     lactated ringers 20 mL/hr at 08/23/21 0700   niCARDipine 7.5 mg/hr (08/23/21 0700)   PRN Meds:.sodium chloride, albumin human, dextrose, lactated ringers, metoprolol tartrate, midazolam, morphine injection, ondansetron (ZOFRAN) IV, oxyCODONE, sodium chloride flush, traMADol  General appearance: alert, cooperative, fatigued, no distress, and tremors Neurologic: tremors Heart: regular rate and rhythm and + rub with CT's in place Lungs: fairly clear anteriorly Abdomen: somewhat  firm, non tender Extremities: no edema Wound: dressings intact/clean  Lab Results: CBC: Recent Labs    08/22/21 1907 08/22/21 1908 08/22/21 2353  WBC 15.1*  --  15.1*  HGB 10.1* 9.9* 10.8*  HCT 31.0* 29.0* 33.6*  PLT 216  --  209   BMET:  Recent Labs    08/22/21 1907 08/22/21 1908 08/22/21 2353  NA 136 140 136  K 4.1 4.0 4.2  CL 104  --  105  CO2 22  --  21*  GLUCOSE 171*  --  158*  BUN 17  --  17  CREATININE 1.31*  --  1.38*  CALCIUM 8.3*  --  8.5*    CMET: Lab Results  Component Value Date   WBC 15.1 (H) 08/22/2021   HGB 10.8 (L) 08/22/2021   HCT 33.6 (L) 08/22/2021   PLT 209 08/22/2021   GLUCOSE 158 (H) 08/22/2021   CHOL 180 08/16/2021   TRIG 89 08/16/2021   HDL 34 (L) 08/16/2021    LDLCALC 128 (H) 08/16/2021   ALT 20 08/16/2021   AST 64 (H) 08/16/2021   NA 136 08/22/2021   K 4.2 08/22/2021   CL 105 08/22/2021   CREATININE 1.38 (H) 08/22/2021   BUN 17 08/22/2021   CO2 21 (L) 08/22/2021   TSH 0.564 07/29/2019   PSA 2.25 05/17/2013   INR 1.4 (H) 08/22/2021   HGBA1C 8.0 (H) 08/15/2021   MICROALBUR 20 08/10/2015      PT/INR:  Recent Labs    08/22/21 1323  LABPROT 16.8*  INR 1.4*   Radiology: [facility] Chest Port 1 View  Result Date: 08/22/2021 CLINICAL DATA:  Post CABG.  75 year old male. EXAM: PORTABLE CHEST 1 VIEW COMPARISON:  Preoperative assessment of August 15, 2021. FINDINGS: Image rotated to the RIGHT with low lung volumes. RIGHT IJ vascular sheath with Swan-Ganz catheter retracted into the proximal to mid superior vena cava. Endotracheal tube approximately 2-2.5 cm above the carina on this projection. Rotation limiting assessment. Changes of median sternotomy and CABG. Chest support tubes, LEFT-sided chest tube and tubes projecting over the RIGHT heart border and along the inferior heart, 3 total chest support tubes in place Gastric tube courses through in off the field of the radiograph tip may be imaged or just off the field of the radiograph. No visible pneumothorax. EKG leads also project over the patient's chest. Patchy airspace opacities throughout the chest some with linear features, some with less well-defined appearance. IMPRESSION: Lines and tubes as above. Rotation limits assessment. Exact position of gastric tube is difficult to assess. Consider dedicated abdominal films for better localization. Tip may be at the GE junction though the side port can not be visualized. Patchy airspace opacities throughout the chest some with linear features, some with less well-defined appearance. Many of these areas likely reflecting atelectasis. More dense airspace process in the retrocardiac region may represent developing consolidative changes. No visible pneumothorax.  Features of CABG. These results will be called to the ordering clinician or representative by the Radiologist Assistant, and communication documented in the PACS or [employer]. Electronically Signed   By: Zetta Bills M.D.   On: 08/22/2021 14:30     Assessment/Plan: S/P Procedure(s) (LRB): CORONARY ARTERY BYPASS GRAFTING (CABG), ON PUMP, TIMES FOUR , USING LEFT INTERNAL MAMMARY ARTERY AND ENDOSCOPICALLY HARVESTED RIGHT GREATER SAPHENOUS VEIN (N/A) TRANSESOPHAGEAL ECHOCARDIOGRAM (TEE) (N/A) APPLICATION OF CELL SAVER ENDOVEIN HARVEST OF GREATER SAPHENOUS VEIN  POD#1 1 Tmax 100.9 2 hemodyn stable , wean cardene off as able,  sinus, EKG with some STchanges -no angina sx- cardiology has also seen patient 3 sats good on 4 liters 4 excellent spont diuresis, weight up about 2 kg- monitor output, creat 1.38- monitor, should equalibrate quickly 5 CT 480 cc post op , keep tubes for now 6 leukocytosis- reactive, monitor clinically with low grade fever 7 expected ABLA ,mild, monitor 8 blood sugars ok on insulin gtt, transition to home meds over time per usual protocols Hg A1c 8.0 preop so will need close outpatient monitoring 9 CXR some vascular fullness, basilar atx 10 routine progression as able   John Giovanni PA-C Pager [phone] 08/23/2021 7:47 AM   Agree with above. ST changes noted this morning.  Will order echocardiogram to evaluate for wall motion abnormalities. Hemodynamically stable, increasing beta-blocker and will wean Cardene Arterial line and Foley. Starting Plavix today for LAD endarterectomy.  Ayrianna Mcginniss Bary Leriche

## 2021-08-23 NOTE — Progress Notes (Signed)
Initial Nutrition Assessment  DOCUMENTATION CODES:   Not applicable  INTERVENTION:   -Liberalize diet to regular to increase intake  -Recommend placing Cortrak to meet nutrition and hydration needs  TF recommendations Osmolite 1.5 @ 55 mL/hr (1320 mL/day) 45 mL ProSource TF - Daily   -Provides 2020 kcal, 91 gm PRO, and 1003 mL of free water    NUTRITION DIAGNOSIS:   Inadequate oral intake related to decreased appetite as evidenced by meal completion < 50%.   GOAL:   Patient will meet greater than or equal to 90% of their needs   MONITOR:   PO intake, Skin  REASON FOR ASSESSMENT:   Malnutrition Screening Tool - 2    ASSESSMENT:    Pt admitted with chest pain. PMH of bladder cancer, Vitamin D deficiency, and T2DM.  09/02: Coronary Balloon Angioplasty and L Heart Cath & Coronary Angiography 09/08: Coronary Artery Bypass Graft x 4  Pt reports that he has had a pretty good appetite until his recent illness. Pt reports that he would eat 3 meals/day; Breakfast: [place] sausage, eggs, and toast, Lunch: his wife and him would go out to eat, sometimes would get fish and baked potato or a vegetable plate, Dinner: would typically be left overs from lunch. Reports that he does not use any ONS at home.  Recorded intake with 75-100% on 9/3 and 0% on 9/9.   Pt reports that the has intentionally lost 20# in the last 3-4 months. States that his doctor had asked him to loose weight. Note, pt does have a constant tremor and is slightly worse now compared to baseline.    Per nurse, pt has had difficulty swallowing today. Waiting for SLP evaluation to determine if Cortrak is appropriate. Nurse expressed concern with passing meds due to not being able to eat applesauce or drink liquids well.  Medications reviewed: NovoLog SSI 0-15 units - TID and 0-5 units at bedtime Labs reviewed: Hgb A1c: 8%, Troponin 341 ng/L, Magnesium 2.8 mg/dL, 24 hr BG trends 109-175 mg/dL  NUTRITION - FOCUSED  PHYSICAL EXAM:  Flowsheet Row Most Recent Value  Orbital Region No depletion  Upper Arm Region Mild depletion  Thoracic and Lumbar Region No depletion  Buccal Region No depletion  Temple Region No depletion  Clavicle Bone Region Moderate depletion  Clavicle and Acromion Bone Region Moderate depletion  Scapular Bone Region Moderate depletion  Dorsal Hand No depletion  Patellar Region Mild depletion  Anterior Thigh Region Mild depletion  Posterior Calf Region Mild depletion  Edema (RD Assessment) None  Hair Reviewed  Eyes Reviewed  Mouth Reviewed  Skin Reviewed  Nails Reviewed  [Pale]       Diet Order:   Diet Order             Diet Carb Modified Fluid consistency: Thin; Room service appropriate? Yes  Diet effective now                   EDUCATION NEEDS:   No education needs have been identified at this time  Skin:  Skin Assessment: Skin Integrity Issues: Skin Integrity Issues:: Incisions Incisions: Midline  Last BM:  9/03  Height:   Ht Readings from Last 1 Encounters:  08/22/21 '5\' 7"'$  (1.702 m)    Weight:   Wt Readings from Last 1 Encounters:  08/23/21 68.3 kg    Ideal Body Weight:  67.3 kg  BMI:  Body mass index is 23.58 kg/m.  Estimated Nutritional Needs:   Kcal:  1900-2100  Protein:  85-100 gm  Fluid:  > 1.9 L    Kaden Daughdrill BS, PLDN Clinical Dietitian See [facility] for contact information.

## 2021-08-23 NOTE — Progress Notes (Signed)
Returned to see patient. Complains of mid sternal chest pain. Some is worse with deep breath but also pressure  Repeat Ecg shows stable ST elevation V2-4  I personally reviewed Echo. Anteroapical wall motion abnormality is unchanged from prior Echo on 08/16/21. In reviewing cardiac cath films LAD was diffusely diseased and calcified. I don't think there is anything we could offer percutaneously even if vessel/graft went down. I would manage now with IV Ntg and cycle Ecg and enzymes. Would favor aggressive medical management for ischemia.   Herbert Aguinaldo Martinique MD, [facility]

## 2021-08-23 NOTE — Progress Notes (Signed)
Inpatient Diabetes Program Recommendations  AACE/ADA: New Consensus Statement on Inpatient Glycemic Control (2015)  Target Ranges:  Prepandial:   less than 140 mg/dL      Peak postprandial:   less than 180 mg/dL (1-2 hours)      Critically ill patients:  140 - 180 mg/dL   Lab Results  Component Value Date   GLUCAP 124 (H) 08/23/2021   HGBA1C 8.0 (H) 08/15/2021    Review of Glycemic Control Results for Robert Osborne, Robert Osborne (MRN [id number]) as of 08/23/2021 09:31  Ref. Range 08/23/2021 05:03 08/23/2021 07:02 08/23/2021 08:00 08/23/2021 08:07 08/23/2021 09:24  Glucose-Capillary Latest Ref Range: 70 - 99 mg/dL 126 (H) 144 (H) 68 (L) 125 (H) 124 (H)   Diabetes history: DM 2 Outpatient Diabetes medications:  Novolog 9 units bid, Tresiba 18 units daily Current orders for Inpatient glycemic control:  Transitioning off insulin drip to Levemir 10 units bid, Novolog moderate tid with meals.   Inpatient Diabetes Program Recommendations:    Agree with current orders.  Will follow.   Thanks,  Adah Perl, RN, BC-ADM Inpatient Diabetes Coordinator Pager [phone]  (8a-5p)

## 2021-08-23 NOTE — Progress Notes (Signed)
Echocardiogram 2D Echocardiogram has been performed.  Oneal Deputy Loyde Orth RDCS 08/23/2021, 10:33 AM  Dr. Kipp Brood at bedside, notified Dr. Gardiner Rhyme for stat read

## 2021-08-23 NOTE — Anesthesia Postprocedure Evaluation (Signed)
Anesthesia Post Note  Patient: Robert Osborne  Procedure(Osborne) Performed: CORONARY ARTERY BYPASS GRAFTING (CABG), ON PUMP, TIMES FOUR , USING LEFT INTERNAL MAMMARY ARTERY AND ENDOSCOPICALLY HARVESTED RIGHT GREATER SAPHENOUS VEIN (Chest) TRANSESOPHAGEAL ECHOCARDIOGRAM (TEE) APPLICATION OF CELL SAVER ENDOVEIN HARVEST OF GREATER SAPHENOUS VEIN     Patient location during evaluation: SICU Anesthesia Type: General Level of consciousness: sedated Pain management: pain level controlled Vital Signs Assessment: post-procedure vital signs reviewed and stable Respiratory status: patient remains intubated per anesthesia plan Cardiovascular status: stable Postop Assessment: no apparent nausea or vomiting Anesthetic complications: no   No notable events documented.  Last Vitals:  Vitals:   08/23/21 1245 08/23/21 1300  BP:  (!) 149/75  Pulse: 91 91  Resp: (!) 23 (!) 22  Temp: 37.7 C 37.8 C  SpO2: 94% 97%    Last Pain:  Vitals:   08/23/21 1300  TempSrc:   PainSc: 9                  Robert Osborne

## 2021-08-23 NOTE — Discharge Instructions (Signed)

## 2021-08-23 NOTE — Progress Notes (Signed)
EKG CRITICAL VALUE     12 lead EKG performed.  Critical value noted.  Annice Pih, RN notified.   Breezy Hertenstein, CCT 08/23/2021 8:28 AM

## 2021-08-23 NOTE — Progress Notes (Signed)
      [address]       [place] [address]             [phone]        Contacted via nursing patient is having increased chest pain.  They treated him with Morphine which actually made the pain worse.  Telemetry shows continued ST elevation which is worse than initial post operative EKG.  Patient complains of pain in his chest.  He also has tremors and is shaking quite a bit but this is evidently present a baseline.  A/P:  Contacted Echocardiogram team to perform Echocardiogram stat Obtain Troponin levels, will cycle as we expect these to be elevated from the surgery, but these should not be rising... will follow Dr. Kipp Brood at bedside, he has contacted Cardiology   Ellwood Handler, PA-C

## 2021-08-23 NOTE — Evaluation (Signed)
Clinical/Bedside Swallow Evaluation Patient Details  Name: Robert Osborne MRN: [id number] Date of Birth: 1945/12/23  Today's Date: 08/23/2021 Time: SLP Start Time (ACUTE ONLY): 59 SLP Stop Time (ACUTE ONLY): [id number] SLP Time Calculation (min) (ACUTE ONLY): 25 min  Past Medical History:  Past Medical History:  Diagnosis Date   Bladder cancer ([place]) 08/18/2013   Cancer ([place])    papillary urethral carcinoma,low grade, non-invasive   Diabetes mellitus without complication ([place])    Erectile dysfunction 10/24/2016   Hyperlipidemia associated with type 2 diabetes mellitus ([place]) 05/17/2013   Hypertension associated with diabetes ([place])    Hypokalemia 11/27/2017   Hypomagnesemia 11/27/2017   Major frontotemporal neurocognitive disorder, probable, with behavioral disturbance 11/27/2017   Vitamin D deficiency 05/17/2013   Past Surgical History:  Past Surgical History:  Procedure Laterality Date   COLONOSCOPY W/ POLYPECTOMY     CORONARY ARTERY BYPASS GRAFT N/A 08/22/2021   Procedure: CORONARY ARTERY BYPASS GRAFTING (CABG), ON PUMP, TIMES FOUR , USING LEFT INTERNAL MAMMARY ARTERY AND ENDOSCOPICALLY HARVESTED RIGHT GREATER SAPHENOUS VEIN;  Surgeon: Lajuana Matte, MD;  Location: [facility];  Service: Open Heart Surgery;  Laterality: N/A;   CORONARY BALLOON ANGIOPLASTY N/A 08/16/2021   Procedure: CORONARY BALLOON ANGIOPLASTY;  Surgeon: Jettie Booze, MD;  Location: [place] [facility];  Service: Cardiovascular;  Laterality: N/A;   ENDOVEIN HARVEST OF GREATER SAPHENOUS VEIN  08/22/2021   Procedure: ENDOVEIN HARVEST OF GREATER SAPHENOUS VEIN;  Surgeon: Lajuana Matte, MD;  Location: [facility];  Service: Open Heart Surgery;;   LEFT HEART CATH AND CORONARY ANGIOGRAPHY N/A 08/16/2021   Procedure: LEFT HEART CATH AND CORONARY ANGIOGRAPHY;  Surgeon: Jettie Booze, MD;  Location: [facility];  Service: Cardiovascular;  Laterality: N/A;   TEE WITHOUT CARDIOVERSION N/A 08/22/2021   Procedure:  TRANSESOPHAGEAL ECHOCARDIOGRAM (TEE);  Surgeon: Lajuana Matte, MD;  Location: [facility];  Service: Open Heart Surgery;  Laterality: N/A;   HPI:  Patient is a 75 y.o. male with PMH: HTN, HLD, BPH, DM-2 who presented to ED  on 9/1 due to several day onset of chest pain which suddenly got worse day of admission. In ED, patient was intermittently tachypneic and bradycardic, Chest x-ray showed no acute abnormalities. On 9/2 he underwent surgery for diagnosis of NSTEMI and had Left heart Cath and Coronary Angiography and CABG. on 9/9, RN observed patient with difficulty swallowing. SLP ordered to complete BSE to determine if patient would require temporary non-oral nutrition.   Assessment / Plan / Recommendation Clinical Impression  Patient presents with a mild-moderate oropharyngeal dysphagia. He appeared to be tolerating PO's without overt s/s aspiration or penetration, however he then started to exhibit delayed coughing and throat clearing. As he has been recuperating from CABG on 9/2, he is not able to cough adequately and reports that it hurts when he does. SLP had informed patient of possible need for temporary non-oral nutrition via tube in nose prior to giving PO's. After coughing and throat clearing, he told SLP, "they can put that tube in now". Patient appears aware and understanding of reasoning for recommendation of NPO with Cortrak feeding tube. Patient will be followed by ST services and suspect objective swallow evaluation will be necessary (MBS). SLP Visit Diagnosis: Dysphagia, unspecified (R13.10)    Aspiration Risk  Moderate aspiration risk;Risk for inadequate nutrition/hydration    Diet Recommendation NPO;Alternative means - temporary   Medication Administration: Via alternative means    Other  Recommendations Oral Care Recommendations: Oral care QID  Follow up Recommendations Other (comment) (TBD)      Frequency and Duration min 2x/week  1 week       Prognosis Prognosis  for Safe Diet Advancement: Good      Swallow Study   General Date of Onset: 08/23/21 HPI: Patient is a 75 y.o. male with PMH: HTN, HLD, BPH, DM-2 who presented to ED  on 9/1 due to several day onset of chest pain which suddenly got worse day of admission. In ED, patient was intermittently tachypneic and bradycardic, Chest x-ray showed no acute abnormalities. On 9/2 he underwent surgery for diagnosis of NSTEMI and had Left heart Cath and Coronary Angiography and CABG. on 9/9, RN observed patient with difficulty swallowing. SLP ordered to complete BSE to determine if patient would require temporary non-oral nutrition. Type of Study: Bedside Swallow Evaluation Previous Swallow Assessment: None found Diet Prior to this Study: Regular;Thin liquids Temperature Spikes Noted: Yes Respiratory Status: Nasal cannula History of Recent Intubation: Yes Length of Intubations (days):  (for surgery only) Date extubated: 08/16/21 Behavior/Cognition: Alert;Cooperative;Pleasant mood Oral Cavity Assessment: Within Functional Limits Oral Care Completed by SLP: Yes Oral Cavity - Dentition: Dentures, top;Dentures, bottom Vision: Functional for self-feeding Self-Feeding Abilities: Needs assist;Needs set up;Other (Comment) (hands shaking making self feeding very difficult) Patient Positioning: Upright in bed Baseline Vocal Quality: Hoarse Volitional Cough: Weak Volitional Swallow: Able to elicit    Oral/Motor/Sensory Function Overall Oral Motor/Sensory Function: Within functional limits   Ice Chips     Thin Liquid Thin Liquid: Impaired Presentation: Straw Pharyngeal  Phase Impairments: Cough - Delayed    Nectar Thick     Honey Thick     Puree Puree: Impaired Presentation: Spoon Pharyngeal Phase Impairments: Cough - Delayed   Solid     Solid: Impaired Oral Phase Functional Implications: Impaired mastication Pharyngeal Phase Impairments: Cough - Delayed      Sonia Baller, MA, CCC-SLP Speech  Therapy

## 2021-08-23 NOTE — Progress Notes (Signed)
Progress Note  Patient Name: Robert Osborne Date of Encounter: 08/23/2021  [facility] Cardiologist: Minus Breeding, MD   Subjective   Doing well post op day #1. Denies any chest pain or dyspnea.  Inpatient Medications    Scheduled Meds:  acetaminophen  1,000 mg Oral Q6H   Or   acetaminophen (TYLENOL) oral liquid 160 mg/5 mL  1,000 mg Per Tube Q6H   aspirin EC  81 mg Oral Daily   bisacodyl  10 mg Oral Daily   Or   bisacodyl  10 mg Rectal Daily   chlorhexidine  15 mL Mouth/Throat NOW   chlorhexidine gluconate (MEDLINE KIT)  15 mL Mouth Rinse BID   Chlorhexidine Gluconate Cloth  6 each Topical Daily   clopidogrel  75 mg Oral Daily   docusate sodium  200 mg Oral Daily   famotidine (PEPCID) IV  20 mg Intravenous Q12H   furosemide  40 mg Intravenous Once   insulin aspart  0-24 Units Subcutaneous Q4H   mouth rinse  15 mL Mouth Rinse BID   metoCLOPramide (REGLAN) injection  10 mg Intravenous Q6H   metoprolol tartrate  25 mg Oral BID   OLANZapine  10 mg Oral QHS   [START ON 08/24/2021] pantoprazole  40 mg Oral Daily   rosuvastatin  40 mg Oral Daily   sodium chloride flush  3 mL Intravenous Q12H   tamsulosin  0.4 mg Oral Daily   Continuous Infusions:  sodium chloride 20 mL/hr at 08/23/21 0700   sodium chloride     sodium chloride     albumin human Stopped (08/22/21 1900)    ceFAZolin (ANCEF) IV Stopped (08/23/21 0541)   insulin 1.4 Units/hr (08/23/21 0700)   lactated ringers     lactated ringers     lactated ringers 20 mL/hr at 08/23/21 0700   niCARDipine 7.5 mg/hr (08/23/21 0700)   PRN Meds: sodium chloride, albumin human, dextrose, lactated ringers, metoprolol tartrate, midazolam, morphine injection, ondansetron (ZOFRAN) IV, oxyCODONE, sodium chloride flush, traMADol   Vital Signs    Vitals:   08/23/21 0615 08/23/21 0630 08/23/21 0645 08/23/21 0700  BP:    123/63  Pulse: 85 (!) 104 86 80  Resp: $Remo'20 18 16 14  'frCWm$ Temp: (!) 100.8 F (38.2 C)  (!) 100.8 F (38.2 C)  (!) 100.6 F (38.1 C)  TempSrc:      SpO2: 100% 94% 94% 95%  Weight:      Height:        Intake/Output Summary (Last 24 hours) at 08/23/2021 0735 Last data filed at 08/23/2021 0700 Gross per 24 hour  Intake 5406.91 ml  Output 4988 ml  Net 418.91 ml    Last 3 Weights 08/23/2021 08/22/2021 08/21/2021  Weight (lbs) 150 lb 9.2 oz 144 lb 13.5 oz 147 lb 4.3 oz  Weight (kg) 68.3 kg 65.7 kg 66.8 kg      Telemetry    NSR, few PVCsPersonally Reviewed  ECG    Today- NSR some increased ST elevation in leads V2-4. - stable- Personally Reviewed  Physical Exam   GEN: No acute distress.   Neck: No JVD, lines in place Cardiac: RRR, no murmurs, rubs, or gallops.  Respiratory: Clear to auscultation bilaterally. Chest tubes in place.  GI: Soft, nontender, non-distended  MS: No edema; No deformity.  No hematoma at the right radial site, 2+ pulse Neuro:  Nonfocal  Psych: Normal affect   Labs    High Sensitivity Troponin:   Recent Labs  Lab 08/15/21  1904 08/15/21 2253 08/16/21 0102 08/16/21 0614 08/16/21 1650  TROPONINIHS 28* 108* 232* [place]  Lab 08/22/21 0528 08/22/21 0754 08/22/21 1237 08/22/21 1347 08/22/21 1907 08/22/21 1908 08/22/21 2353  NA 137   < > 139   < > 136 140 136  K 4.1   < > 3.9   < > 4.1 4.0 4.2  CL 104   < > 104  --  104  --  105  CO2 26  --   --   --  22  --  21*  GLUCOSE 172*   < > 133*  --  171*  --  158*  BUN 23   < > 17  --  17  --  17  CREATININE 1.39*   < > 1.00  --  1.31*  --  1.38*  CALCIUM 9.1  --   --   --  8.3*  --  8.5*  GFRNONAA 52*  --   --   --  56*  --  53*  ANIONGAP 7  --   --   --  10  --  10   < > = values in this interval not displayed.      Hematology Recent Labs  Lab 08/22/21 1323 08/22/21 1347 08/22/21 1907 08/22/21 1908 08/22/21 2353  WBC 8.4  --  15.1*  --  15.1*  RBC 3.17*  --  3.58*  --  3.83*  HGB 8.8*   < > 10.1* 9.9* 10.8*  HCT 27.7*   < > 31.0* 29.0* 33.6*  MCV 87.4  --  86.6   --  87.7  MCH 27.8  --  28.2  --  28.2  MCHC 31.8  --  32.6  --  32.1  RDW 14.5  --  14.5  --  14.8  PLT 169  --  216  --  209   < > = values in this interval not displayed.     BNP No results for input(s): BNP, PROBNP in the last 168 hours.    DDimer No results for input(s): DDIMER in the last 168 hours.   Radiology    DG Chest Port 1 View  Result Date: 08/22/2021 CLINICAL DATA:  Post CABG.  75 year old male. EXAM: PORTABLE CHEST 1 VIEW COMPARISON:  Preoperative assessment of August 15, 2021. FINDINGS: Image rotated to the RIGHT with low lung volumes. RIGHT IJ vascular sheath with Swan-Ganz catheter retracted into the proximal to mid superior vena cava. Endotracheal tube approximately 2-2.5 cm above the carina on this projection. Rotation limiting assessment. Changes of median sternotomy and CABG. Chest support tubes, LEFT-sided chest tube and tubes projecting over the RIGHT heart border and along the inferior heart, 3 total chest support tubes in place Gastric tube courses through in off the field of the radiograph tip may be imaged or just off the field of the radiograph. No visible pneumothorax. EKG leads also project over the patient's chest. Patchy airspace opacities throughout the chest some with linear features, some with less well-defined appearance. IMPRESSION: Lines and tubes as above. Rotation limits assessment. Exact position of gastric tube is difficult to assess. Consider dedicated abdominal films for better localization. Tip may be at the GE junction though the side port can not be visualized. Patchy airspace opacities throughout the chest some with linear features, some with less well-defined appearance. Many of these areas likely reflecting atelectasis. More dense airspace process in the retrocardiac region  may represent developing consolidative changes. No visible pneumothorax. Features of CABG. These results will be called to the ordering clinician or representative by the  Radiologist Assistant, and communication documented in the PACS or [employer]. Electronically Signed   By: Zetta Bills M.D.   On: 08/22/2021 14:30    Cardiac Studies   Procedures  CORONARY BALLOON ANGIOPLASTY  LEFT HEART CATH AND CORONARY ANGIOGRAPHY   Conclusion      Ost Cx to Prox Cx lesion is 75% stenosed.   2nd Mrg lesion is 80% stenosed.   1st Mrg lesion is 90% stenosed.   Ost LAD to Prox LAD lesion is 25% stenosed.   1st Diag lesion is 90% stenosed.   Mid LAD lesion is 90% stenosed.   Dist LAD lesion is 100% stenosed.  Flow restored with a wire.  Unable to advance balloon.   Mid RCA lesion is 25% stenosed.   RPAV lesion is 90% stenosed.   Balloon angioplasty was performed using a BALLOON SAPPHIRE 1.0X10.   Post intervention, there is a 95% residual stenosis.   There is mild to moderate left ventricular systolic dysfunction.   LV end diastolic pressure is normal.   The left ventricular ejection fraction is 45-50% by visual estimate.   There is no aortic valve stenosis.   Severe disease in the proximal to mid LAD, and ostial circumflex.Marland Kitchen  Heavily calcified lesion at the origin of a large first diagonal.  Culprit for today's presentation was an occlusion in the mid LAD, past the areas of heavy calcification.  This was successfully wired and flow restored, but a balloon could not be advanced through the heavily calcific disease more proximally.     Will obtain cardiac surgery consult.  If surgery not an option, will have to see if there is an appropriate high risk PCI strategy.   Continue IV heparin after 8 hours post sheath removal.  IV tirofiban started in the Cath Lab which will continue for 12 hours.   Severe tortuosity of the right subclavian requiring 75 cm destination sheath. Coronary Diagrams  Diagnostic Dominance: Right Intervention  Implants      Echo: IMPRESSIONS     1. Left ventricular ejection fraction, by estimation, is 55%. The left  ventricle  has normal function. The left ventricle demonstrates regional  wall motion abnormalities (see scoring diagram/findings for description).  There is moderate asymmetric left  ventricular hypertrophy of the basal and septal segments. Left ventricular  diastolic parameters are consistent with Grade I diastolic dysfunction  (impaired relaxation).   2. Right ventricular systolic function is normal. The right ventricular  size is normal. Tricuspid regurgitation signal is inadequate for assessing  PA pressure.   3. The mitral valve is grossly normal. Trivial mitral valve  regurgitation.   4. The aortic valve is tricuspid. Aortic valve regurgitation is not  visualized. No aortic stenosis is present. Aortic valve mean gradient  measures 2.0 mmHg.   5. The inferior vena cava is normal in size with greater than 50%  respiratory variability, suggesting right atrial pressure of 3 mmHg.   Comparison(s): Prior images reviewed side by side. LVEF approximately 55%  with new periapical wall motion abnormality suggestive of mid ot distal  LAD disease versus possible stress-induced cardiomyopathy.   Patient Profile     75 y.o. male with NSTEMI, anterior distribution  Assessment & Plan    CAD S/p PTCA of LAD distally to restore flow.  Severe diffuse 3 vessel CAD. Now s/p CABG on  9/8. Severe LAD disease required atherectomy and LIMA placed to diagonal and SVG to LAD.   Continue IV heparin.  On IV cardene now. Plan to resume beta blocker. Will need DAPT prior to DC.   On  high dose rosuvastatin to continue.  Would avoid ARB for now until we see how his renal function recovers post CABG. ST changes noted but hemodynamically stable and no angina.   2. Hyperlipidemia: LDL above target on 9/2 in the setting of MI.  On high dose Crestor.   3. DM: SSI.   4. AKI. Renal function is stable.   For questions or updates, please contact [place] Please consult [email or web address] for contact info under         Signed, Orah Sonnen Martinique, MD  08/23/2021, 7:35 AM

## 2021-08-23 NOTE — Progress Notes (Addendum)
[address]       [place] [address]             [phone]      1 Day Post-Op  Procedure(s) (LRB): CORONARY ARTERY BYPASS GRAFTING (CABG), ON PUMP, TIMES FOUR , USING LEFT INTERNAL MAMMARY ARTERY AND ENDOSCOPICALLY HARVESTED RIGHT GREATER SAPHENOUS VEIN (N/A) TRANSESOPHAGEAL ECHOCARDIOGRAM (TEE) (N/A) APPLICATION OF CELL SAVER ENDOVEIN HARVEST OF GREATER SAPHENOUS VEIN   Total Length of Stay:  LOS: 7 days    SUBJECTIVE:  Vitals:   08/23/21 1400 08/23/21 1430  BP: 132/70 127/70  Pulse: 85 84  Resp: 17 18  Temp: 100.2 F (37.9 C) 100.2 F (37.9 C)  SpO2: 100% 100%    Intake/Output      09/08 0701 09/09 0700 09/09 0701 09/10 0700   P.O.     I.V. (mL/kg) 3258.2 (47.7) 302.6 (4.4)   Blood 350    IV Piggyback 1798.8    Total Intake(mL/kg) 5406.9 (79.2) 302.6 (4.4)   Urine (mL/kg/hr) 3908 (2.4) 873 (1.4)   Blood 600    Chest Tube 480 30   Total Output 4988 903   Net +418.9 -600.4            sodium chloride 20 mL/hr at 08/23/21 1300   sodium chloride     sodium chloride      ceFAZolin (ANCEF) IV Stopped (08/23/21 0541)   insulin Stopped (08/23/21 1119)   lactated ringers     lactated ringers     lactated ringers 20 mL/hr at 08/23/21 1300   nitroGLYCERIN 25 mcg/min (08/23/21 1300)    CBC    Component Value Date/Time   WBC 15.1 (H) 08/22/2021 2353   RBC 3.83 (L) 08/22/2021 2353   HGB 10.8 (L) 08/22/2021 2353   HGB 15.1 08/29/2019 1236   HCT 33.6 (L) 08/22/2021 2353   HCT 44.5 08/29/2019 1236   PLT 209 08/22/2021 2353   PLT 222 08/29/2019 1236   MCV 87.7 08/22/2021 2353   MCV 87 08/29/2019 1236   MCH 28.2 08/22/2021 2353   MCHC 32.1 08/22/2021 2353   RDW 14.8 08/22/2021 2353   RDW 12.8 08/29/2019 1236   LYMPHSABS 0.6 (L) 08/15/2021 1614   LYMPHSABS 0.9 08/29/2019 1236   MONOABS 0.3 08/15/2021 1614   EOSABS 0.0 08/15/2021 1614   EOSABS 0.0 08/29/2019 1236   BASOSABS 0.0 08/15/2021 1614   BASOSABS 0.0 08/29/2019 1236    CMP     Component Value Date/Time   NA 136 08/22/2021 2353   NA 136 08/29/2019 1236   K 4.2 08/22/2021 2353   CL 105 08/22/2021 2353   CO2 21 (L) 08/22/2021 2353   GLUCOSE 158 (H) 08/22/2021 2353   BUN 17 08/22/2021 2353   BUN 18 08/29/2019 1236   CREATININE 1.38 (H) 08/22/2021 2353   CREATININE 1.28 05/17/2013 1513   CALCIUM 8.5 (L) 08/22/2021 2353   PROT 6.4 (L) 08/16/2021 0614   PROT 6.7 08/29/2019 1236   ALBUMIN 3.0 (L) 08/16/2021 0614   ALBUMIN 4.1 08/29/2019 1236   AST 64 (H) 08/16/2021 0614   ALT 20 08/16/2021 0614   ALKPHOS 83 08/16/2021 0614   BILITOT 0.5 08/16/2021 0614   BILITOT 1.0 08/29/2019 1236   GFRNONAA 53 (L) 08/22/2021 2353   GFRNONAA 57 (L) 05/17/2013 1513   GFRAA 82 08/29/2019 1236   GFRAA 66 05/17/2013 1513   ABG    Component Value Date/Time   PHART 7.389 08/22/2021 1908  PCO2ART 35.4 08/22/2021 1908   PO2ART 57 (L) 08/22/2021 1908   HCO3 21.3 08/22/2021 1908   TCO2 22 08/22/2021 1908   ACIDBASEDEF 3.0 (H) 08/22/2021 1908   O2SAT 89.0 08/22/2021 1908   CBG (last 3)  Recent Labs    08/23/21 0924 08/23/21 1041 08/23/21 1235  GLUCAP 124* 122* 125*     ASSESSMENT:  Pain improved.. on NTG drip at 5, Troponin levels relatively stable at 317, 341 and third level remains pending  Echocardiogram with EF 50%, global motion wall abnormalities, apical akinesis.. Cards has evaluated and would need to treat ischemia medically  Poor oral intake, he failed Swallow evaluation that was just completed.  Will place order for NG Tubes and initiate tube feedings  Monitor closely, patient is frail, but overall stable throughout the day  Ellwood Handler, PA-C 4:09 PM 08/23/21   Agree with above Third Troponin is down to 277.  Hemodynamics remain stable in sinus rhythm.  BMET    Component Value Date/Time   NA 133 (L) 08/23/2021 1424   NA 136 08/29/2019 1236   K 4.2 08/23/2021 1424   CL 101 08/23/2021 1424   CO2 22 08/23/2021 1424   GLUCOSE  181 (H) 08/23/2021 1424   BUN 20 08/23/2021 1424   BUN 18 08/29/2019 1236   CREATININE 1.50 (H) 08/23/2021 1424   CREATININE 1.28 05/17/2013 1513   CALCIUM 8.1 (L) 08/23/2021 1424   GFRNONAA 48 (L) 08/23/2021 1424   GFRNONAA 57 (L) 05/17/2013 1513   GFRAA 82 08/29/2019 1236   GFRAA 66 05/17/2013 1513   CBC    Component Value Date/Time   WBC 15.4 (H) 08/23/2021 1424   RBC 3.63 (L) 08/23/2021 1424   HGB 10.4 (L) 08/23/2021 1424   HGB 15.1 08/29/2019 1236   HCT 32.5 (L) 08/23/2021 1424   HCT 44.5 08/29/2019 1236   PLT 237 08/23/2021 1424   PLT 222 08/29/2019 1236   MCV 89.5 08/23/2021 1424   MCV 87 08/29/2019 1236   MCH 28.7 08/23/2021 1424   MCHC 32.0 08/23/2021 1424   RDW 15.0 08/23/2021 1424   RDW 12.8 08/29/2019 1236   LYMPHSABS 0.6 (L) 08/15/2021 1614   LYMPHSABS 0.9 08/29/2019 1236   MONOABS 0.3 08/15/2021 1614   EOSABS 0.0 08/15/2021 1614   EOSABS 0.0 08/29/2019 1236   BASOSABS 0.0 08/15/2021 1614   BASOSABS 0.0 08/29/2019 1236

## 2021-08-24 ENCOUNTER — Inpatient Hospital Stay (HOSPITAL_COMMUNITY): Payer: Medicare HMO

## 2021-08-24 DIAGNOSIS — R079 Chest pain, unspecified: Secondary | ICD-10-CM | POA: Diagnosis not present

## 2021-08-24 LAB — BASIC METABOLIC PANEL
Anion gap: 8 (ref 5–15)
BUN: 22 mg/dL (ref 8–23)
CO2: 23 mmol/L (ref 22–32)
Calcium: 8.4 mg/dL — ABNORMAL LOW (ref 8.9–10.3)
Chloride: 101 mmol/L (ref 98–111)
Creatinine, Ser: 1.57 mg/dL — ABNORMAL HIGH (ref 0.61–1.24)
GFR, Estimated: 45 mL/min — ABNORMAL LOW (ref >60)
Glucose, Bld: 309 mg/dL — ABNORMAL HIGH (ref 70–99)
Potassium: 4.2 mmol/L (ref 3.5–5.1)
Sodium: 132 mmol/L — ABNORMAL LOW (ref 135–145)

## 2021-08-24 LAB — GLUCOSE, CAPILLARY
Glucose-Capillary: 170 mg/dL — ABNORMAL HIGH (ref 70–99)
Glucose-Capillary: 179 mg/dL — ABNORMAL HIGH (ref 70–99)
Glucose-Capillary: 267 mg/dL — ABNORMAL HIGH (ref 70–99)
Glucose-Capillary: 269 mg/dL — ABNORMAL HIGH (ref 70–99)
Glucose-Capillary: 286 mg/dL — ABNORMAL HIGH (ref 70–99)
Glucose-Capillary: 342 mg/dL — ABNORMAL HIGH (ref 70–99)

## 2021-08-24 LAB — CBC
HCT: 26.4 % — ABNORMAL LOW (ref 39.0–52.0)
Hemoglobin: 8.8 g/dL — ABNORMAL LOW (ref 13.0–17.0)
MCH: 29 pg (ref 26.0–34.0)
MCHC: 33.3 g/dL (ref 30.0–36.0)
MCV: 87.1 fL (ref 80.0–100.0)
Platelets: 192 10*3/uL (ref 150–400)
RBC: 3.03 MIL/uL — ABNORMAL LOW (ref 4.22–5.81)
RDW: 14.9 % (ref 11.5–15.5)
WBC: 10 10*3/uL (ref 4.0–10.5)
nRBC: 0 % (ref 0.0–0.2)

## 2021-08-24 IMAGING — DX DG CHEST 1V PORT
1 series · 1 of 1 positions shown · non-contrast
Comparison: 1 day prior

CLINICAL DATA: Diabetes.  Hypertension.  Ex-smoker.  Pneumothorax.

EXAM:
PORTABLE CHEST 1 VIEW

[chest ap]
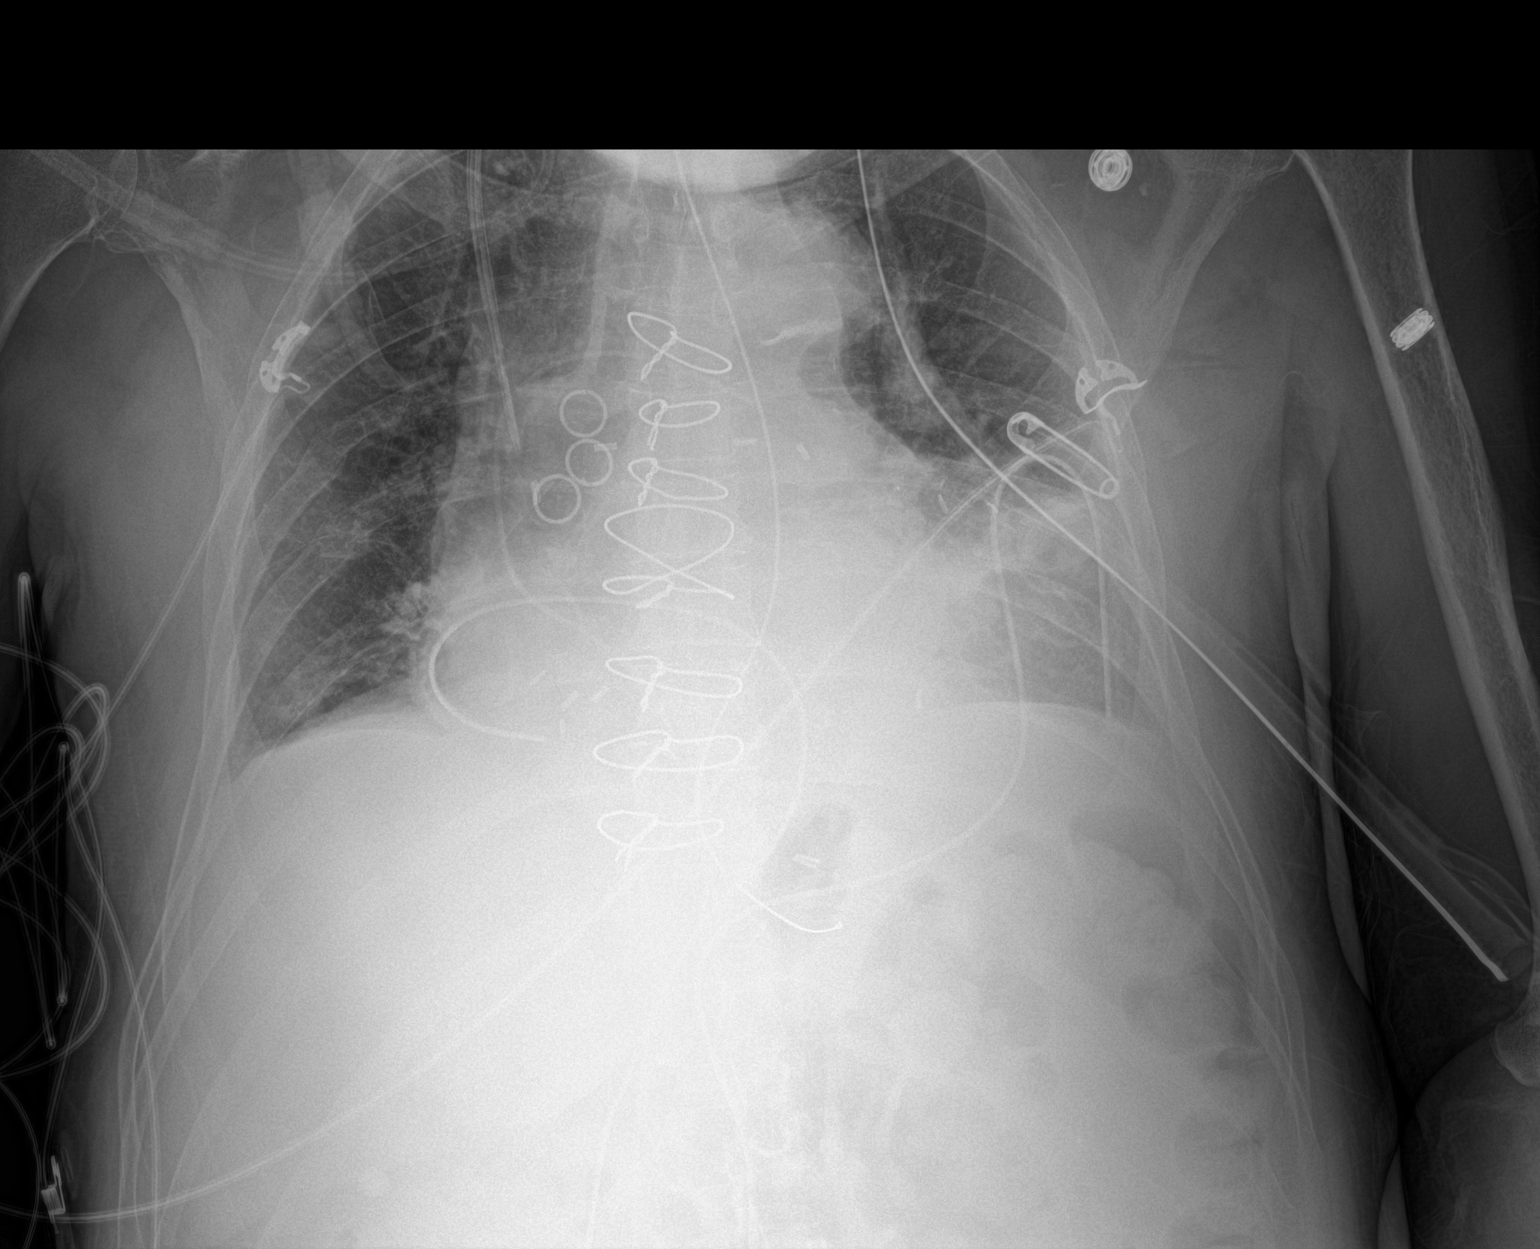

[1 of 1 positions shown; findings below may reference images not displayed]

FINDINGS: Nasogastric tube terminates in the proximal stomach. Right internal
jugular line tip at low SVC, similar. Patient rotated to the right.
Median sternotomy for CABG. Left-sided chest tube and mediastinal
drain again identified.

Remote right rib fractures.

Cardiomegaly accentuated by AP portable technique. Small left
pleural effusion, similar. The Chin overlies the apices. No
pneumothorax. Mild pulmonary venous congestion. Left greater than
right base airspace disease.
IMPRESSION: No significant change in aeration since 1 day prior.

Cardiomegaly with pulmonary venous congestion, small left pleural
effusion, and bibasilar atelectasis.

No pneumothorax with left chest tube in place.

## 2021-08-24 MED ORDER — SODIUM CHLORIDE 0.9 % IV SOLN
2.0000 g | Freq: Two times a day (BID) | INTRAVENOUS | Status: DC
  Administered 2021-08-25 (×3): 2 g via INTRAVENOUS
  Filled 2021-08-24 (×3): qty 2

## 2021-08-24 MED ORDER — INSULIN DETEMIR 100 UNIT/ML ~~LOC~~ SOLN
20.0000 [IU] | Freq: Two times a day (BID) | SUBCUTANEOUS | Status: DC
  Administered 2021-08-24 – 2021-08-27 (×6): 20 [IU] via SUBCUTANEOUS
  Filled 2021-08-24 (×8): qty 0.2

## 2021-08-24 MED ORDER — ISOSORBIDE MONONITRATE ER 30 MG PO TB24
30.0000 mg | ORAL_TABLET | Freq: Every day | ORAL | Status: DC
  Administered 2021-08-24 – 2021-08-30 (×7): 30 mg via ORAL
  Filled 2021-08-24 (×7): qty 1

## 2021-08-24 MED ORDER — INSULIN ASPART 100 UNIT/ML IJ SOLN
0.0000 [IU] | INTRAMUSCULAR | Status: DC
  Administered 2021-08-24: 3 [IU] via SUBCUTANEOUS
  Administered 2021-08-24: 8 [IU] via SUBCUTANEOUS
  Administered 2021-08-25: 2 [IU] via SUBCUTANEOUS
  Administered 2021-08-25: 3 [IU] via SUBCUTANEOUS
  Administered 2021-08-25: 2 [IU] via SUBCUTANEOUS
  Administered 2021-08-26: 3 [IU] via SUBCUTANEOUS
  Administered 2021-08-26: 2 [IU] via SUBCUTANEOUS
  Administered 2021-08-26: 3 [IU] via SUBCUTANEOUS
  Administered 2021-08-27: 2 [IU] via SUBCUTANEOUS
  Administered 2021-08-27: 3 [IU] via SUBCUTANEOUS

## 2021-08-24 MED ORDER — SODIUM CHLORIDE 0.9 % IV SOLN
1.0000 g | Freq: Two times a day (BID) | INTRAVENOUS | Status: DC
  Administered 2021-08-24: 1 g via INTRAVENOUS
  Filled 2021-08-24 (×3): qty 1

## 2021-08-24 NOTE — Progress Notes (Signed)
2 Days Post-Op Procedure(s) (LRB): CORONARY ARTERY BYPASS GRAFTING (CABG), ON PUMP, TIMES FOUR , USING LEFT INTERNAL MAMMARY ARTERY AND ENDOSCOPICALLY HARVESTED RIGHT GREATER SAPHENOUS VEIN (N/A) TRANSESOPHAGEAL ECHOCARDIOGRAM (TEE) (N/A) APPLICATION OF CELL SAVER ENDOVEIN HARVEST OF GREATER SAPHENOUS VEIN Subjective: No complaints.  Objective: Vital signs in last 24 hours: Temp:  [99.5 F (37.5 C)-100.9 F (38.3 C)] 100.4 F (38 C) (09/10 1000) Pulse Rate:  [71-95] 91 (09/10 1109) Cardiac Rhythm: Normal sinus rhythm (09/10 0400) Resp:  [17-28] 21 (09/10 1000) BP: (107-159)/(56-117) 135/69 (09/10 1109) SpO2:  [94 %-100 %] 100 % (09/10 1000) Arterial Line BP: (113-163)/(48-70) 123/50 (09/10 0700) Weight:  [69.8 kg] 69.8 kg (09/10 0500)  Hemodynamic parameters for last 24 hours: CVP:  [0 mmHg-15 mmHg] 5 mmHg  Intake/Output from previous day: 09/09 0701 - 09/10 0700 In: 2087.1 [I.V.:1122.1; NG/GT:665; IV Piggyback:300] Out: [id number]; Chest Tube:90] Intake/Output this shift: Total I/O In: 370.2 [I.V.:260.2; NG/GT:110] Out: 325 [Urine:325]  General appearance: alert and cooperative Neurologic: intact Heart: regular rate and rhythm, S1, S2 normal, no murmur Lungs: diminished breath sounds bibasilar Abdomen: mildly distended, firm, non-tender; bowel sounds normal Extremities: extremities normal, atraumatic, no cyanosis or edema Wound: dressing dry  Lab Results: Recent Labs    08/23/21 1424 08/24/21 0322  WBC 15.4* 10.0  HGB 10.4* 8.8*  HCT 32.5* 26.4*  PLT 237 192   BMET:  Recent Labs    08/23/21 1424 08/24/21 0322  NA 133* 132*  K 4.2 4.2  CL 101 101  CO2 22 23  GLUCOSE 181* 309*  BUN 20 22  CREATININE 1.50* 1.57*  CALCIUM 8.1* 8.4*    PT/INR:  Recent Labs    08/22/21 1323  LABPROT 16.8*  INR 1.4*   ABG    Component Value Date/Time   PHART 7.389 08/22/2021 1908   HCO3 21.3 08/22/2021 1908   TCO2 22 08/22/2021 1908   ACIDBASEDEF 3.0 (H)  08/22/2021 1908   O2SAT 89.0 08/22/2021 1908   CBG (last 3)  Recent Labs    08/24/21 0329 08/24/21 0656 08/24/21 1119  GLUCAP 286* 342* 269*    Assessment/Plan: S/P Procedure(s) (LRB): CORONARY ARTERY BYPASS GRAFTING (CABG), ON PUMP, TIMES FOUR , USING LEFT INTERNAL MAMMARY ARTERY AND ENDOSCOPICALLY HARVESTED RIGHT GREATER SAPHENOUS VEIN (N/A) TRANSESOPHAGEAL ECHOCARDIOGRAM (TEE) (N/A) APPLICATION OF CELL SAVER ENDOVEIN HARVEST OF GREATER SAPHENOUS VEIN  POD 2 Hemodynamically stable in sinus rhythm.  Low grade fevers past couple days, Tmax 100.9 this am. WBC normal. Sources could be lungs, urine. CXR shows bibasilar atelectasis or air space disease. He is working on IS. Will culture urine. Start empiric Maxepime for lungs. Central line is from surgery and only 1 days old.   Tolerating tube feeds at goal.  DM: hyperglycemic on SSI. Will start Levemir and change SSI to Every 4 hr.   DC chest tubes, arterial line.   LOS: 8 days    Robert Osborne 08/24/2021

## 2021-08-24 NOTE — Progress Notes (Signed)
EKG CRITICAL VALUE     12 lead EKG performed.  Critical value noted.  Rolland Bimler, RN notified.   Willaim Rayas, CCT 08/24/2021 9:53 AM

## 2021-08-24 NOTE — Progress Notes (Signed)
  Speech Language Pathology Treatment: Dysphagia  Patient Details Name: Robert Osborne MRN: [id number] DOB: May 18, 1946 Today's Date: 08/24/2021 Time: [id number] SLP Time Calculation (min) (ACUTE ONLY): 22 min  Assessment / Plan / Recommendation Clinical Impression  Pt seen for PO trial with min verbal cues provided for small sips during intake with pt exhibiting a delayed cough after intake of various consistencies including: ice chips, thin via tsp/cup/straw, puree and soft.  One incident of delayed, weak cough noted (weakness primarily d/t pain with coughing).  Improvement made with strength as pt is able to expectorate phlegm min with independent use of oral suctioning post-cough.  Continue NPO status, but allow ice chips and water via tsp intermittently after oral care provided with plan for objective swallow assessment prn and/or progression to conservative diet.  ST will continue to f/u for PO readiness/need for objective assessment while in acute setting.  HPI HPI: Patient is a 75 y.o. male with PMH: HTN, HLD, BPH, DM-2 who presented to ED  on 9/1 due to several day onset of chest pain which suddenly got worse day of admission. In ED, patient was intermittently tachypneic and bradycardic, Chest x-ray showed no acute abnormalities. On 9/2 he underwent surgery for diagnosis of NSTEMI and had Left heart Cath and Coronary Angiography and CABG. on 9/9, RN observed patient with difficulty swallowing. SLP ordered to complete BSE to determine if patient would require temporary non-oral nutrition.      SLP Plan  Continue with current plan of care       Recommendations  Diet recommendations: NPO Medication Administration: Via alternative means                Oral Care Recommendations: Oral care QID;Oral care prior to ice chip/H20 Follow up Recommendations: Other (comment) (TBD) SLP Visit Diagnosis: Dysphagia, unspecified (R13.10) Plan: Continue with current plan of care                        Elvina Sidle, M.S., CCC-SLP 08/24/2021, 10:01 AM

## 2021-08-24 NOTE — Progress Notes (Signed)
Patient ID: Robert Osborne, male   DOB: 23-Nov-1946, 75 y.o.   MRN: [id number] TCTS Evening Rounds:  T 99.3  Hemodynamics stable Sats 100% on 2L.  Urine output ok  Awake and alert. Tolerating tube feeds.

## 2021-08-24 NOTE — Progress Notes (Signed)
Progress Note  Patient Name: Robert Osborne Date of Encounter: 08/24/2021  [facility] Cardiologist: Rollene Rotunda, MD    Patient Profile     75 y.o. male with NSTEMI, anterior distribution with PCI of LAD and subsequent CABG Post CABG chest pain   Subjective   Feels better.  Inpatient Medications    Scheduled Meds:  acetaminophen  1,000 mg Oral Q6H   Or   acetaminophen (TYLENOL) oral liquid 160 mg/5 mL  1,000 mg Per Tube Q6H   aspirin EC  81 mg Oral Daily   bisacodyl  10 mg Oral Daily   Or   bisacodyl  10 mg Rectal Daily   chlorhexidine gluconate (MEDLINE KIT)  15 mL Mouth Rinse BID   Chlorhexidine Gluconate Cloth  6 each Topical Daily   clopidogrel  75 mg Oral Daily   docusate sodium  200 mg Oral Daily   feeding supplement (PROSource TF)  45 mL Per Tube Daily   insulin aspart  0-15 Units Subcutaneous TID WC   insulin aspart  0-5 Units Subcutaneous QHS   mouth rinse  15 mL Mouth Rinse BID   metoprolol tartrate  25 mg Oral BID   OLANZapine zydis  10 mg Oral QHS   pantoprazole  40 mg Oral Daily   rosuvastatin  40 mg Oral Daily   sodium chloride flush  3 mL Intravenous Q12H   tamsulosin  0.4 mg Oral Daily   Continuous Infusions:  sodium chloride 20 mL/hr at 08/24/21 0900   sodium chloride     sodium chloride     feeding supplement (OSMOLITE 1.5 CAL)     insulin Stopped (08/23/21 1119)   lactated ringers     lactated ringers     lactated ringers 20 mL/hr at 08/24/21 0900   nitroGLYCERIN 40 mcg/min (08/24/21 0900)   PRN Meds: sodium chloride, dextrose, lactated ringers, metoprolol tartrate, midazolam, morphine injection, nitroGLYCERIN, ondansetron (ZOFRAN) IV, oxyCODONE, sodium chloride flush, traMADol   Vital Signs    Vitals:   08/24/21 0400 08/24/21 0500 08/24/21 0600 08/24/21 0700  BP: 114/62 125/62 (!) 132/117 108/68  Pulse: 77 78 90 89  Resp: 20 20 (!) 27 19  Temp: 100 F (37.8 C) 100.2 F (37.9 C) (!) 100.8 F (38.2 C) (!) 100.9 F (38.3 C)   TempSrc: Bladder     SpO2: 100% 100% 100% 100%  Weight:  69.8 kg    Height:        Intake/Output Summary (Last 24 hours) at 08/24/2021 0927 Last data filed at 08/24/2021 0900 Gross per 24 hour  Intake 2239.7 ml  Output 1518 ml  Net 721.7 ml    Last 3 Weights 08/24/2021 08/23/2021 08/22/2021  Weight (lbs) 153 lb 14.1 oz 150 lb 9.2 oz 144 lb 13.5 oz  Weight (kg) 69.8 kg 68.3 kg 65.7 kg      Telemetry    Nonsustained ventricular tachycardia  ECG       Physical Exam   Well developed and nourished in no acute distress HENT normal Neck supple lines in plaee Clear Regular rate and rhythm, no murmurs or gallops Abd-soft with active BS No Clubbing cyanosis edema Skin-warm and dry A & Oriented  Grossly normal sensory and motor function     Labs    High Sensitivity Troponin:   Recent Labs  Lab 08/16/21 1650 08/23/21 1010 08/23/21 1216 08/23/21 1424 08/23/21 1819  TROPONINIHS 4,567* 317* 341* 277* 343*       Chemistry Recent Labs  Lab 08/22/21 2353 08/23/21 1424 08/24/21 0322  NA 136 133* 132*  K 4.2 4.2 4.2  CL 105 101 101  CO2 21* 22 23  GLUCOSE 158* 181* 309*  BUN $Re'17 20 22  'YtX$ CREATININE 1.38* 1.50* 1.57*  CALCIUM 8.5* 8.1* 8.4*  GFRNONAA 53* 48* 45*  ANIONGAP $RemoveB'10 10 8      'LUwhpZrk$ Hematology Recent Labs  Lab 08/22/21 2353 08/23/21 1424 08/24/21 0322  WBC 15.1* 15.4* 10.0  RBC 3.83* 3.63* 3.03*  HGB 10.8* 10.4* 8.8*  HCT 33.6* 32.5* 26.4*  MCV 87.7 89.5 87.1  MCH 28.2 28.7 29.0  MCHC 32.1 32.0 33.3  RDW 14.8 15.0 14.9  PLT 209 237 192     BNP No results for input(s): BNP, PROBNP in the last 168 hours.    DDimer No results for input(s): DDIMER in the last 168 hours.   Radiology    DG Chest Port 1 View  Result Date: 08/24/2021 CLINICAL DATA:  Diabetes.  Hypertension.  Ex-smoker.  Pneumothorax. EXAM: PORTABLE CHEST 1 VIEW COMPARISON:  1 day prior FINDINGS: Nasogastric tube terminates in the proximal stomach. Right internal jugular line tip at  low SVC, similar. Patient rotated to the right. Median sternotomy for CABG. Left-sided chest tube and mediastinal drain again identified. Remote right rib fractures. Cardiomegaly accentuated by AP portable technique. Small left pleural effusion, similar. The Chin overlies the apices. No pneumothorax. Mild pulmonary venous congestion. Left greater than right base airspace disease. IMPRESSION: No significant change in aeration since 1 day prior. Cardiomegaly with pulmonary venous congestion, small left pleural effusion, and bibasilar atelectasis. No pneumothorax with left chest tube in place. Electronically Signed   By: Abigail Miyamoto M.D.   On: 08/24/2021 08:27   DG Chest Port 1 View  Result Date: 08/23/2021 CLINICAL DATA:  Just 2 EXAM: PORTABLE CHEST 1 VIEW COMPARISON:  August 22, 2021 FINDINGS: Interval extubation and removal of enteric tube. Unchanged position of right IJ line mediastinal drains and left-sided chest tube. Cardiac and mediastinal contours are unchanged post median sternotomy and CABG. Unchanged bilateral heterogeneous opacities, likely combination of pulmonary edema and atelectasis. Slightly increased size of trace left pleural effusion. No evidence of pneumothorax. IMPRESSION: Interval extubation and removal of enteric tube. Slightly increased size of trace left pleural effusion. Electronically Signed   By: Yetta Glassman M.D.   On: 08/23/2021 08:48   DG Chest Port 1 View  Result Date: 08/22/2021 CLINICAL DATA:  Post CABG.  75 year old male. EXAM: PORTABLE CHEST 1 VIEW COMPARISON:  Preoperative assessment of August 15, 2021. FINDINGS: Image rotated to the RIGHT with low lung volumes. RIGHT IJ vascular sheath with Swan-Ganz catheter retracted into the proximal to mid superior vena cava. Endotracheal tube approximately 2-2.5 cm above the carina on this projection. Rotation limiting assessment. Changes of median sternotomy and CABG. Chest support tubes, LEFT-sided chest tube and tubes  projecting over the RIGHT heart border and along the inferior heart, 3 total chest support tubes in place Gastric tube courses through in off the field of the radiograph tip may be imaged or just off the field of the radiograph. No visible pneumothorax. EKG leads also project over the patient's chest. Patchy airspace opacities throughout the chest some with linear features, some with less well-defined appearance. IMPRESSION: Lines and tubes as above. Rotation limits assessment. Exact position of gastric tube is difficult to assess. Consider dedicated abdominal films for better localization. Tip may be at the GE junction though the side port can not be visualized. Patchy  airspace opacities throughout the chest some with linear features, some with less well-defined appearance. Many of these areas likely reflecting atelectasis. More dense airspace process in the retrocardiac region may represent developing consolidative changes. No visible pneumothorax. Features of CABG. These results will be called to the ordering clinician or representative by the Radiologist Assistant, and communication documented in the PACS or [employer]. Electronically Signed   By: Zetta Bills M.D.   On: 08/22/2021 14:30   DG Abd Portable 1 View  Result Date: 08/23/2021 CLINICAL DATA:  NG tube placement EXAM: PORTABLE ABDOMEN - 1 VIEW COMPARISON:  Chest x-ray 08/23/2021 FINDINGS: Partially visualized sternotomy. Right-sided vascular catheter tip over the distal SVC. Esophageal tube tip overlies the proximal stomach, side-port slightly distal to GE junction. Drainage catheters over the mediastinum and chest. Pleural effusion on the left with airspace disease. IMPRESSION: 1. Esophageal tube tip overlies the proximal stomach, side-port just beyond GE junction region. Electronically Signed   By: Donavan Foil M.D.   On: 08/23/2021 18:47   ECHOCARDIOGRAM LIMITED  Result Date: 08/23/2021    ECHOCARDIOGRAM REPORT   Patient Name:   Robert Osborne Date of Exam: 08/23/2021 Medical Rec #:  [id number]       Height:       67.0 in Accession #:    [id number]      Weight:       150.6 lb Date of Birth:  January 09, 1946       BSA:          1.792 m Patient Age:    44 years        BP:           138/70 mmHg Patient Gender: M               HR:           70 bpm. Exam Location:  Inpatient Procedure: Limited Echo, Color Doppler and Cardiac Doppler STAT ECHO  Results communicated to Dr Kipp Brood and Dr Martinique at 10:55. Indications:    Acute Ischemic Heart Disease  History:        Patient has prior history of Echocardiogram examinations.  Sonographer:    Raquel Sarna Senior RDCS Referring Phys: [id number] Lucile Crater LIGHTFOOT  Sonographer Comments: Dr. Kipp Brood at bedside IMPRESSIONS  1. Left ventricular ejection fraction, by estimation, is 50 to 55%. The left ventricle has low normal function. The left ventricle demonstrates regional wall motion abnormalities (see scoring diagram/findings for description). Apical akinesis. There is moderate left ventricular hypertrophy. Left ventricular diastolic function could not be evaluated.  2. Right ventricular systolic function is moderately reduced. Free wall hypokinesis. The right ventricular size is mildly enlarged.  3. The inferior vena cava is normal in size with greater than 50% respiratory variability, suggesting right atrial pressure of 3 mmHg. Conclusion(s)/Recommendation(s): Compared to prior echo, there is new RV systolic dysfunction, with free wall hypokinesis. Consider evaluation for PE. FINDINGS  Left Ventricle: Left ventricular ejection fraction, by estimation, is 50 to 55%. The left ventricle has low normal function. The left ventricle demonstrates regional wall motion abnormalities. The left ventricular internal cavity size was normal in size. There is moderate left ventricular hypertrophy. Left ventricular diastolic function could not be evaluated.  LV Wall Scoring: The apex is akinetic. The entire anterior wall, entire lateral  wall, entire septum, and entire inferior wall are normal. Right Ventricle: The right ventricular size is mildly enlarged. Right vetricular wall thickness was not well visualized. Right ventricular systolic function  is moderately reduced. Left Atrium: Left atrial size was not assessed. Right Atrium: Right atrial size was not assessed. Pericardium: Trivial pericardial effusion is present. Mitral Valve: The mitral valve was not assessed. Not assessed mitral valve regurgitation. Tricuspid Valve: The tricuspid valve is grossly normal. Tricuspid valve regurgitation is trivial. Aortic Valve: The aortic valve was not assessed. Aortic valve regurgitation not assessed. Pulmonic Valve: The pulmonic valve was not assessed. Pulmonic valve regurgitation not assessed. Aorta: Aortic root could not be assessed. Venous: The inferior vena cava is normal in size with greater than 50% respiratory variability, suggesting right atrial pressure of 3 mmHg. IAS/Shunts: The interatrial septum was not assessed.  RIGHT VENTRICLE RV S prime:     7.40 cm/s TAPSE (M-mode): 0.8 cm Oswaldo Milian MD Electronically signed by Oswaldo Milian MD Signature Date/Time: 08/23/2021/10:55:28 AM    Final     Cardiac Studies   Procedures  CORONARY BALLOON ANGIOPLASTY  LEFT HEART CATH AND CORONARY ANGIOGRAPHY   Conclusion      Ost Cx to Prox Cx lesion is 75% stenosed.   2nd Mrg lesion is 80% stenosed.   1st Mrg lesion is 90% stenosed.   Ost LAD to Prox LAD lesion is 25% stenosed.   1st Diag lesion is 90% stenosed.   Mid LAD lesion is 90% stenosed.   Dist LAD lesion is 100% stenosed.  Flow restored with a wire.  Unable to advance balloon.   Mid RCA lesion is 25% stenosed.   RPAV lesion is 90% stenosed.   Balloon angioplasty was performed using a BALLOON SAPPHIRE 1.0X10.   Post intervention, there is a 95% residual stenosis.   There is mild to moderate left ventricular systolic dysfunction.   LV end diastolic pressure is normal.    The left ventricular ejection fraction is 45-50% by visual estimate.   There is no aortic valve stenosis.   Severe disease in the proximal to mid LAD, and ostial circumflex.Marland Kitchen  Heavily calcified lesion at the origin of a large first diagonal.  Culprit for today's presentation was an occlusion in the mid LAD, past the areas of heavy calcification.  This was successfully wired and flow restored, but a balloon could not be advanced through the heavily calcific disease more proximally.     Will obtain cardiac surgery consult.  If surgery not an option, will have to see if there is an appropriate high risk PCI strategy.   Continue IV heparin after 8 hours post sheath removal.  IV tirofiban started in the Cath Lab which will continue for 12 hours.   Severe tortuosity of the right subclavian requiring 75 cm destination sheath. Coronary Diagrams  Diagnostic Dominance: Right Intervention  Implants      Echo: IMPRESSIONS     1. Left ventricular ejection fraction, by estimation, is 55%. The left  ventricle has normal function. The left ventricle demonstrates regional  wall motion abnormalities (see scoring diagram/findings for description).  There is moderate asymmetric left  ventricular hypertrophy of the basal and septal segments. Left ventricular  diastolic parameters are consistent with Grade I diastolic dysfunction  (impaired relaxation).   2. Right ventricular systolic function is normal. The right ventricular  size is normal. Tricuspid regurgitation signal is inadequate for assessing  PA pressure.   3. The mitral valve is grossly normal. Trivial mitral valve  regurgitation.   4. The aortic valve is tricuspid. Aortic valve regurgitation is not  visualized. No aortic stenosis is present. Aortic valve mean gradient  measures 2.0 mmHg.  5. The inferior vena cava is normal in size with greater than 50%  respiratory variability, suggesting right atrial pressure of 3 mmHg.    Comparison(s): Prior images reviewed side by side. LVEF approximately 55%  with new periapical wall motion abnormality suggestive of mid ot distal  LAD disease versus possible stress-induced cardiomyopathy.    Assessment & Plan    CAD S/p PTCA of LAD distally to restore flow.  Severe diffuse 3 vessel CAD. Now s/p CABG on 9/8. Severe LAD disease required atherectomy and LIMA placed to diagonal and SVG to LAD.   Continue IV heparin.  On IV cardene now. Plan to resume beta blocker. Will need DAPT prior to DC.   On  high dose rosuvastatin to continue.  Would avoid ARB for now until we see how his renal function recovers post CABG. ST changes noted but hemodynamically stable and no angina.       . AKI. Renal function is stable creatinine of 1.57  With creatinine still elevated, will hold off on ARB.  We will transition nitroglycerin from IV--p.o. Signed, Virl Axe, MD  08/24/2021, 9:27 AM

## 2021-08-25 ENCOUNTER — Inpatient Hospital Stay (HOSPITAL_COMMUNITY): Payer: Medicare HMO

## 2021-08-25 LAB — BPAM RBC
Blood Product Expiration Date: 202210092359
Blood Product Expiration Date: 202210092359
Unit Type and Rh: 5100
Unit Type and Rh: 5100

## 2021-08-25 LAB — BASIC METABOLIC PANEL
Anion gap: 5 (ref 5–15)
BUN: 24 mg/dL — ABNORMAL HIGH (ref 8–23)
CO2: 26 mmol/L (ref 22–32)
Calcium: 8.4 mg/dL — ABNORMAL LOW (ref 8.9–10.3)
Chloride: 104 mmol/L (ref 98–111)
Creatinine, Ser: 1.25 mg/dL — ABNORMAL HIGH (ref 0.61–1.24)
GFR, Estimated: 59 mL/min — ABNORMAL LOW (ref >60)
Glucose, Bld: 254 mg/dL — ABNORMAL HIGH (ref 70–99)
Potassium: 4.4 mmol/L (ref 3.5–5.1)
Sodium: 135 mmol/L (ref 135–145)

## 2021-08-25 LAB — TYPE AND SCREEN
ABO/RH(D): O POS
Antibody Screen: NEGATIVE
Unit division: 0
Unit division: 0

## 2021-08-25 LAB — CBC
HCT: 28.3 % — ABNORMAL LOW (ref 39.0–52.0)
Hemoglobin: 9 g/dL — ABNORMAL LOW (ref 13.0–17.0)
MCH: 28 pg (ref 26.0–34.0)
MCHC: 31.8 g/dL (ref 30.0–36.0)
MCV: 88.2 fL (ref 80.0–100.0)
Platelets: 209 10*3/uL (ref 150–400)
RBC: 3.21 MIL/uL — ABNORMAL LOW (ref 4.22–5.81)
RDW: 14.9 % (ref 11.5–15.5)
WBC: 8.8 10*3/uL (ref 4.0–10.5)
nRBC: 0 % (ref 0.0–0.2)

## 2021-08-25 LAB — URINE CULTURE
Culture: <10000 — AB
Special Requests: NORMAL

## 2021-08-25 LAB — GLUCOSE, CAPILLARY
Glucose-Capillary: 105 mg/dL — ABNORMAL HIGH (ref 70–99)
Glucose-Capillary: 147 mg/dL — ABNORMAL HIGH (ref 70–99)
Glucose-Capillary: 169 mg/dL — ABNORMAL HIGH (ref 70–99)
Glucose-Capillary: 234 mg/dL — ABNORMAL HIGH (ref 70–99)
Glucose-Capillary: 67 mg/dL — ABNORMAL LOW (ref 70–99)
Glucose-Capillary: 72 mg/dL (ref 70–99)
Glucose-Capillary: 86 mg/dL (ref 70–99)
Glucose-Capillary: 96 mg/dL (ref 70–99)

## 2021-08-25 IMAGING — DX DG ABDOMEN 1V
1 series · 1 of 1 positions shown · non-contrast
Comparison: [DATE].

CLINICAL DATA: NG tube placement

EXAM:
ABDOMEN - 1 VIEW

[abdomen]
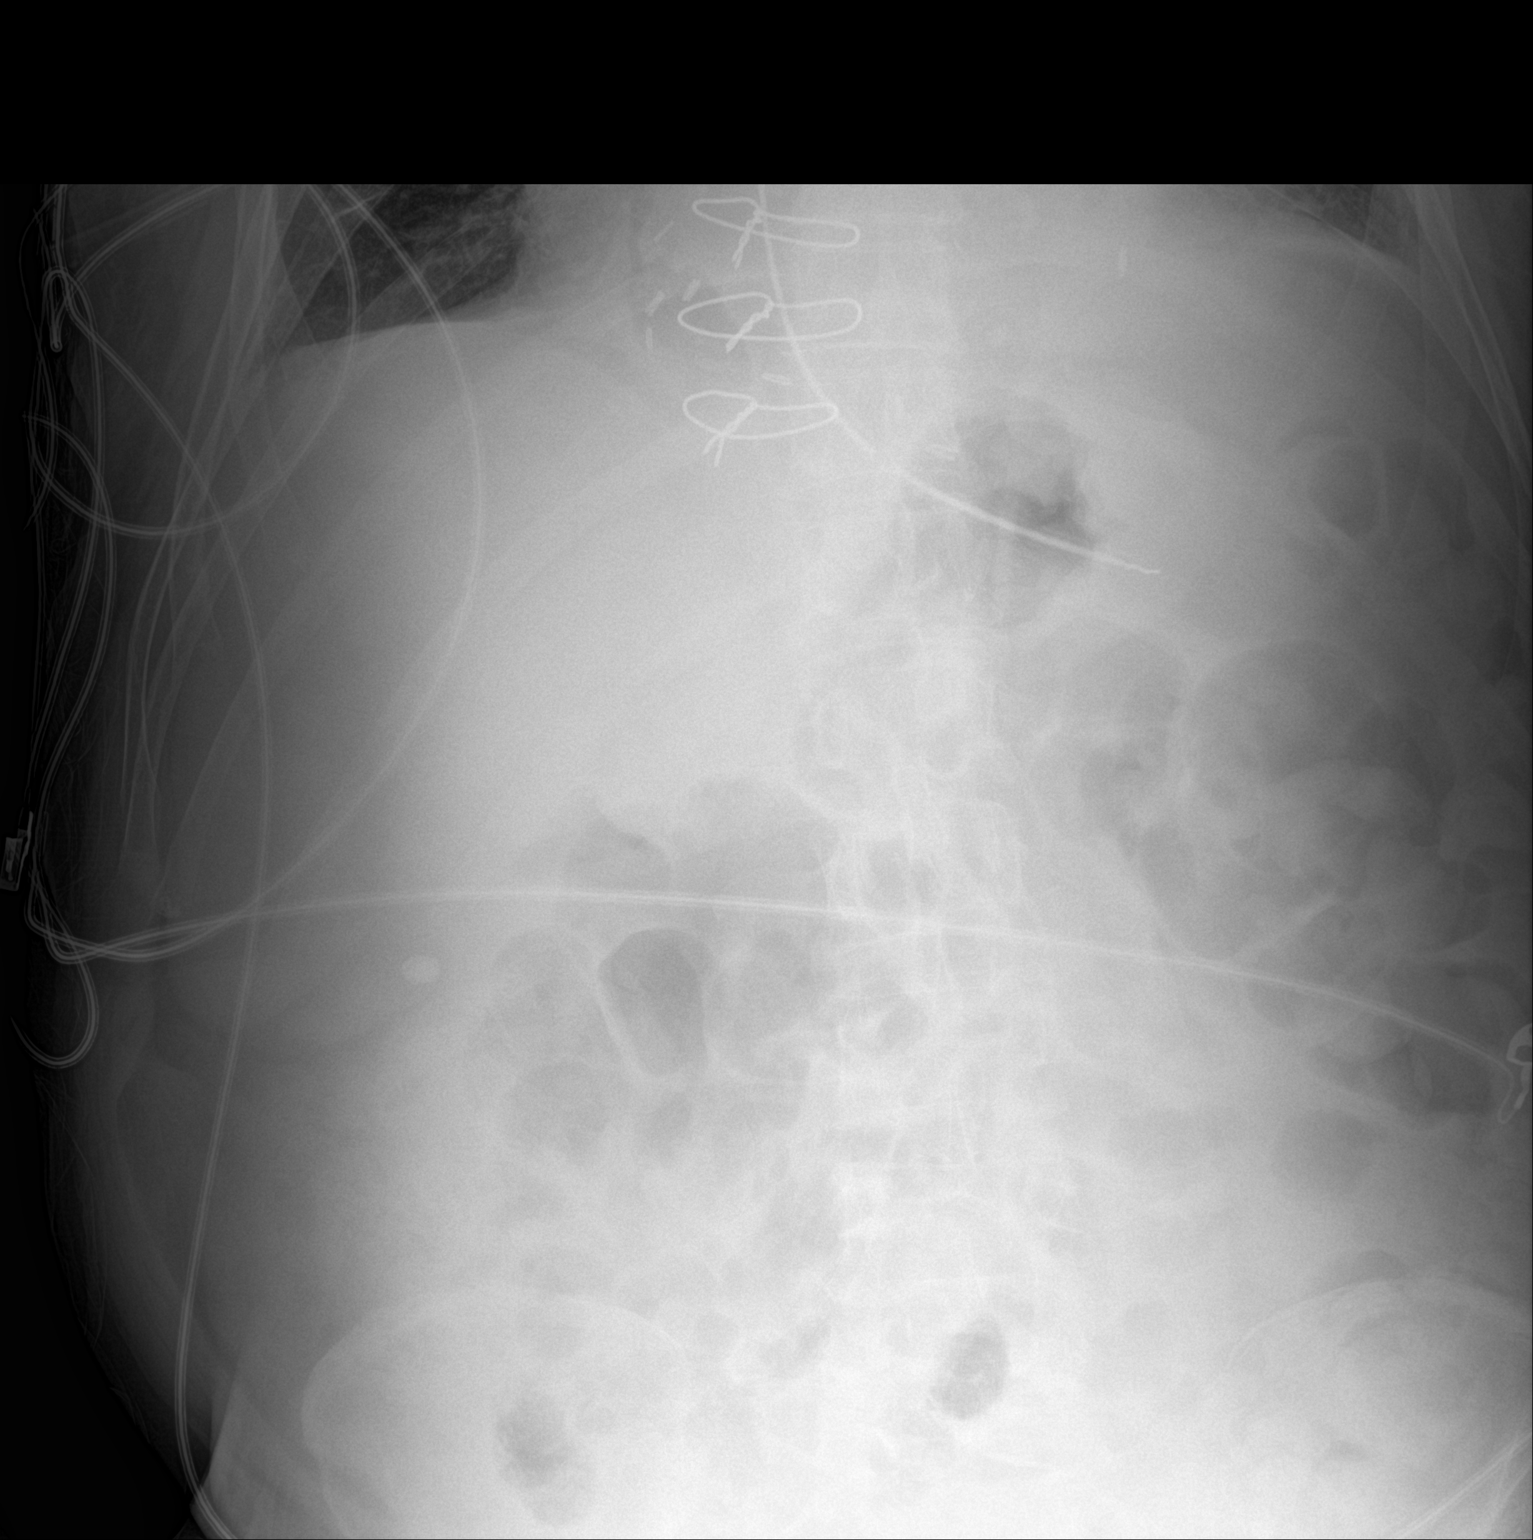

[1 of 1 positions shown; findings below may reference images not displayed]

FINDINGS: Airspace disease at the LEFT lung base similar to recent radiograph.

Gastric tube, tip in the proximal stomach side port at the EG
junction.

Bowel gas pattern with scattered gas-filled loops of bowel
throughout the abdomen. No overt signs of obstruction. Calcific
density over the RIGHT upper quadrant likely a gallstone.

On limited assessment there is no acute skeletal process.
IMPRESSION: Gastric tube in the proximal stomach side port at the EG junction.
Recommend advancing a few centimeters to ensure intragastric
placement.

Airspace disease at the LEFT lung base.

Potential gallstone.

Scattered loops of bowel in the abdomen, gas-filled may reflect
ileus.

## 2021-08-25 IMAGING — DX DG ABD PORTABLE 1V
1 series · 1 of 1 positions shown · non-contrast
Comparison: Abdominal x-ray dated [DATE].

CLINICAL DATA: Enteric tube placement.

EXAM:
PORTABLE ABDOMEN - 1 VIEW

[abdomen kub]
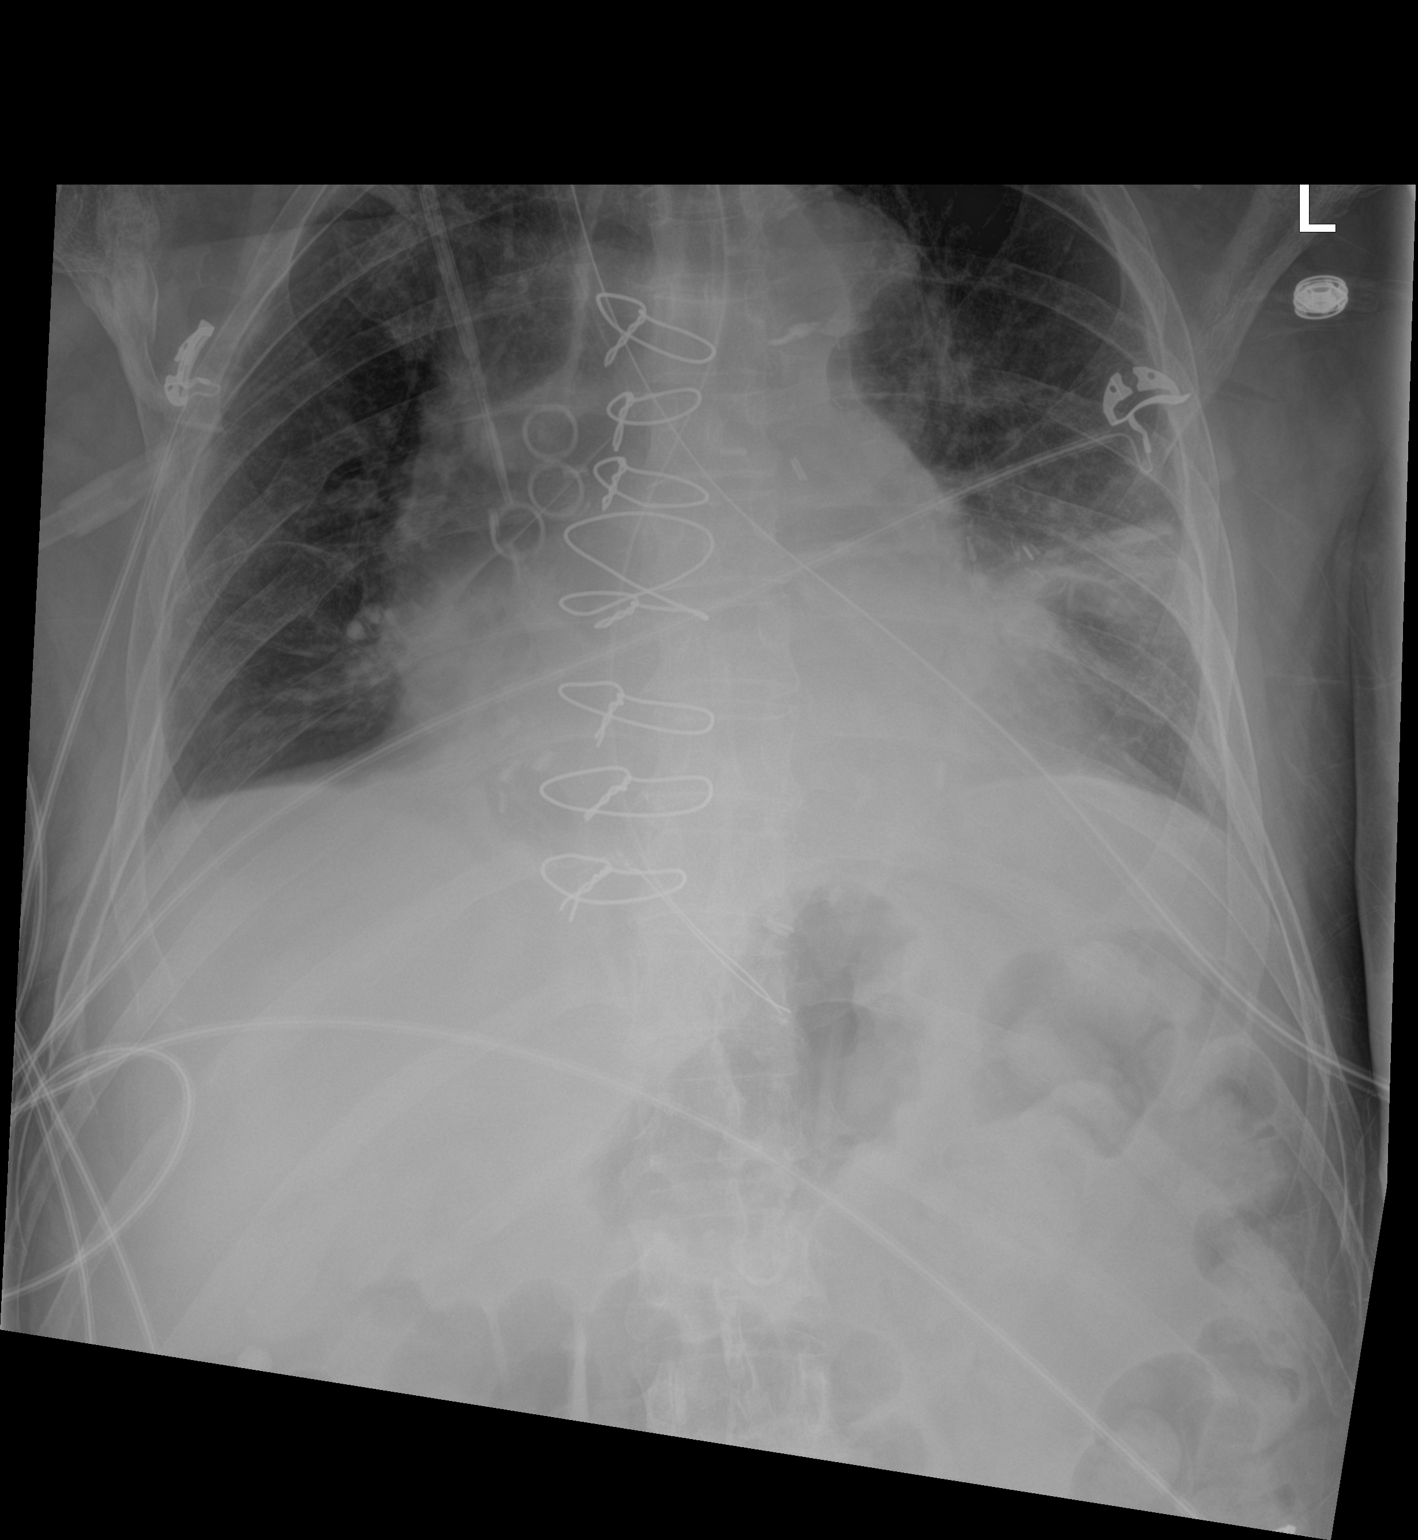

[1 of 1 positions shown; findings below may reference images not displayed]

FINDINGS: Enteric tube looped back on itself at the GE junction with the tip
in the distal esophagus. Right-sided central venous catheter with
tip in the distal SVC. Stable cardiomediastinal silhouette status
post CABG. Similar bibasilar atelectasis with trace pleural
effusions.
IMPRESSION: 1. Enteric tube looped back on itself at the GE junction with the
tip in the distal esophagus. Recommend repositioning.

## 2021-08-25 IMAGING — DX DG CHEST 1V PORT
1 series · 1 of 1 positions shown · non-contrast
Comparison: [DATE] [DATE], [DATE] [DATE] a.m.

CLINICAL DATA: Chest tube removal

EXAM:
PORTABLE CHEST 1 VIEW

[chest]
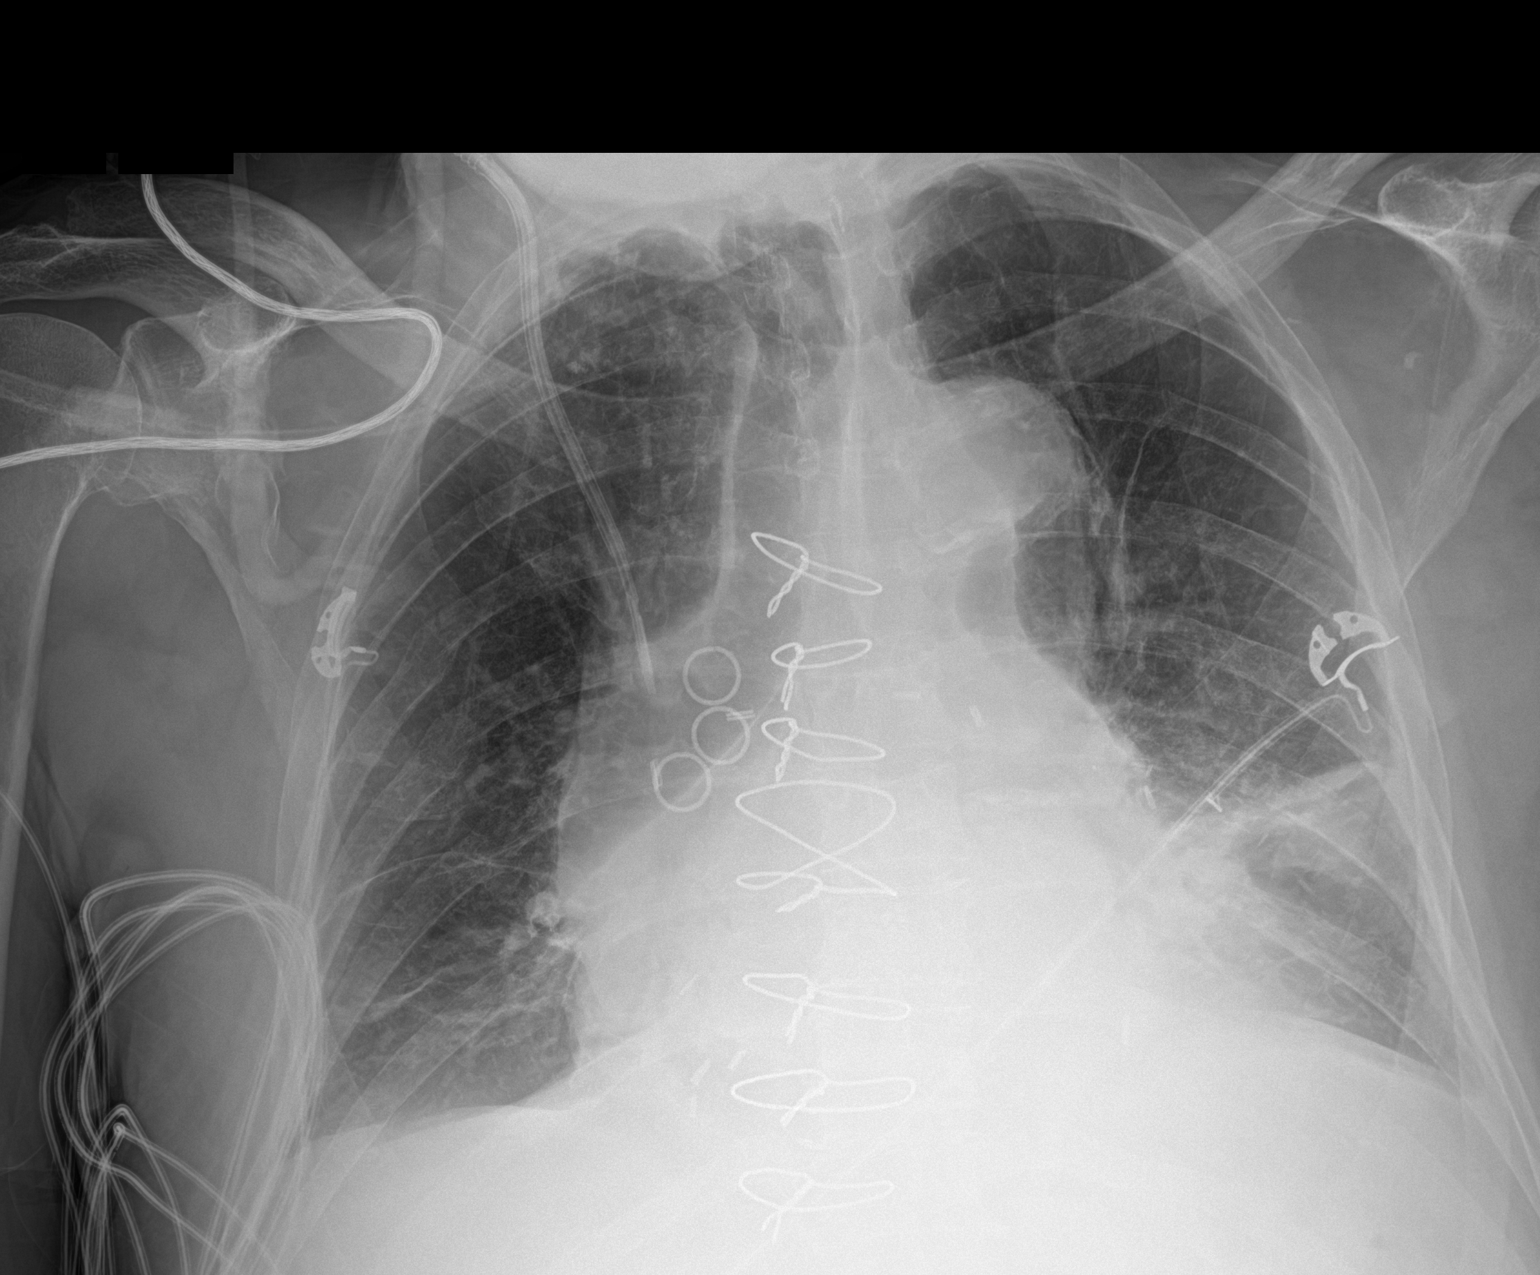

[1 of 1 positions shown; findings below may reference images not displayed]

FINDINGS: The previously noted nasogastric tube, chest tube and mediastinal
drain been removed. A right central venous line is persistent with
distal tip in the superior vena cava. Patchy opacity of the left
lung base is noted unchanged. Mild linear atelectasis of the right
lung base is identified. The heart size is mildly enlarged. Bony
structures are stable. There is no pneumothorax.
IMPRESSION: The previously noted nasogastric tube, chest tube and mediastinal
drain been removed. No pneumothorax.

Lungs are not changed.

## 2021-08-25 NOTE — Progress Notes (Addendum)
[address]       [place] [address]             [phone]      t3 Days Post-Op Procedure(s) (LRB): CORONARY ARTERY BYPASS GRAFTING (CABG), ON PUMP, TIMES FOUR , USING LEFT INTERNAL MAMMARY ARTERY AND ENDOSCOPICALLY HARVESTED RIGHT GREATER SAPHENOUS VEIN (N/A) TRANSESOPHAGEAL ECHOCARDIOGRAM (TEE) (N/A) APPLICATION OF CELL SAVER ENDOVEIN HARVEST OF GREATER SAPHENOUS VEIN  Subjective:  Patient feeling a little better.  He continues to incisional pain.  He pulled his NG tube out early this morning.  He evidently tolerated water and meds with applesauce this morning without difficulty.  + BM  Objective: Vital signs in last 24 hours: Temp:  [98.1 F (36.7 C)-100.9 F (38.3 C)] 99.8 F (37.7 C) (09/11 0806) Pulse Rate:  [72-91] 91 (09/11 0800) Cardiac Rhythm: Normal sinus rhythm (09/11 0800) Resp:  [18-38] 21 (09/11 0800) BP: (99-150)/(59-81) 141/81 (09/11 1027) SpO2:  [97 %-100 %] 100 % (09/11 0800) Weight:  [69.9 kg] 69.9 kg (09/11 0500)  Intake/Output from previous day: 09/10 0701 - 09/11 0700 In: 1551.1 [I.V.:516; NG/GT:935; IV Piggyback:100.1] Out: 1075 [Urine:1075]  General appearance: alert, cooperative, and no distress Heart: regular rate and rhythm Lungs: diminished breath sounds bibasilar Abdomen: soft, non-tender; bowel sounds normal; no masses,  no organomegaly Extremities: extremities normal, atraumatic, no cyanosis or edema Wound: clean and dry  Lab Results: Recent Labs    08/24/21 0322 08/25/21 0420  WBC 10.0 8.8  HGB 8.8* 9.0*  HCT 26.4* 28.3*  PLT 192 209   BMET:  Recent Labs    08/24/21 0322 08/25/21 0420  NA 132* 135  K 4.2 4.4  CL 101 104  CO2 23 26  GLUCOSE 309* 254*  BUN 22 24*  CREATININE 1.57* 1.25*  CALCIUM 8.4* 8.4*    PT/INR:  Recent Labs    08/22/21 1323  LABPROT 16.8*  INR 1.4*   ABG    Component Value Date/Time   PHART 7.389 08/22/2021 1908   HCO3 21.3 08/22/2021 1908   TCO2 22 08/22/2021  1908   ACIDBASEDEF 3.0 (H) 08/22/2021 1908   O2SAT 89.0 08/22/2021 1908   CBG (last 3)  Recent Labs    08/25/21 0005 08/25/21 0426 08/25/21 0735  GLUCAP 169* 234* 147*    Assessment/Plan: S/P Procedure(s) (LRB): CORONARY ARTERY BYPASS GRAFTING (CABG), ON PUMP, TIMES FOUR , USING LEFT INTERNAL MAMMARY ARTERY AND ENDOSCOPICALLY HARVESTED RIGHT GREATER SAPHENOUS VEIN (N/A) TRANSESOPHAGEAL ECHOCARDIOGRAM (TEE) (N/A) APPLICATION OF CELL SAVER ENDOVEIN HARVEST OF GREATER SAPHENOUS VEIN  CV- NSR, BP stable- continue Lopressor, Imdur, Plavix Pulm- weaning oxygen as tolerated, continue aggressive pulmonary toilet, on ABX for HCAP Renal- creatinine improved down to 1.25,  K is WNL at 4.4 Dysphagia- patient pulled out NG tube this morning around 4am, nursing crushed his meds in applesauce and he has had some sips of water w/o difficulty, will contact SLP to see if they can evaluate patient today, if not he is agreeable to replace NG tubes with resumption of tube feeds Expected post operative blood loss anemia Hgb stable at 9.0 DM- sugars increased likely due to tube feedings, continue insulin as ordered for now can adjusted as needed and oral intake improves Deconditioning- severe, PT/OT consult has been ordered, I suspect he will need SNF at discharge   LOS: 9 days    Ellwood Handler, PA-C 08/25/2021   Chart reviewed, patient examined, agree with above. He pulled out NG. Will leave it  out for now and have ST reevaluate in am. He had some sips of water and meds in apple sauce this am and did ok with that so I suspect his swallowing is better.

## 2021-08-25 NOTE — Progress Notes (Signed)
Patient pulled out NGT over night. RN gave patient ice chips and water with spoon, no signs/ symptoms of aspiration noted. RN gave patient morning meds with applesauce and patient tolerated it well, no signs and symptoms of aspiration. SLP to follow up.

## 2021-08-25 NOTE — Progress Notes (Signed)
EKG CRITICAL VALUE     12 lead EKG performed.  Critical value noted.  Rolland Bimler, RN notified.   Willaim Rayas, CCT 08/25/2021 9:04 AM

## 2021-08-26 DIAGNOSIS — Z951 Presence of aortocoronary bypass graft: Secondary | ICD-10-CM

## 2021-08-26 LAB — GLUCOSE, CAPILLARY
Glucose-Capillary: 125 mg/dL — ABNORMAL HIGH (ref 70–99)
Glucose-Capillary: 164 mg/dL — ABNORMAL HIGH (ref 70–99)
Glucose-Capillary: 169 mg/dL — ABNORMAL HIGH (ref 70–99)
Glucose-Capillary: 178 mg/dL — ABNORMAL HIGH (ref 70–99)
Glucose-Capillary: 82 mg/dL (ref 70–99)
Glucose-Capillary: 96 mg/dL (ref 70–99)

## 2021-08-26 MED ORDER — BISACODYL 10 MG RE SUPP
10.0000 mg | Freq: Once | RECTAL | Status: AC
  Administered 2021-08-26: 10 mg via RECTAL
  Filled 2021-08-26: qty 1

## 2021-08-26 MED ORDER — POTASSIUM CHLORIDE CRYS ER 20 MEQ PO TBCR
40.0000 meq | EXTENDED_RELEASE_TABLET | Freq: Every day | ORAL | Status: DC
  Administered 2021-08-26 – 2021-08-30 (×5): 40 meq via ORAL
  Filled 2021-08-26 (×5): qty 2

## 2021-08-26 MED ORDER — FUROSEMIDE 40 MG PO TABS
40.0000 mg | ORAL_TABLET | Freq: Every day | ORAL | Status: DC
  Administered 2021-08-26 – 2021-08-30 (×5): 40 mg via ORAL
  Filled 2021-08-26 (×5): qty 1

## 2021-08-26 MED ORDER — METOCLOPRAMIDE HCL 5 MG/ML IJ SOLN
10.0000 mg | Freq: Three times a day (TID) | INTRAMUSCULAR | Status: DC
  Administered 2021-08-26 – 2021-08-27 (×4): 10 mg via INTRAVENOUS
  Filled 2021-08-26 (×3): qty 2

## 2021-08-26 NOTE — Evaluation (Signed)
Occupational Therapy Evaluation Patient Details Name: Robert Osborne MRN: [id number] DOB: 09-Dec-1946 Today's Date: 08/26/2021   History of Present Illness Pt is a 75 year old admitted on 08/15/21 with chest pain, tachypneic and bradycardia. + NSTEMI. Underwent CABG on 08/23/21. PMH: HTN, HD, BPH, DM2.   Clinical Impression   Pt was independent prior to admission, but fairly sedentary. He presents with generalized weakness, impaired standing balance and decreased activity tolerance. VSS with HR 108-111, Sp02 93-96% on RA. Pt requires set up to min assist for ADL and min assist for all mobility. Pt likely to progress well and return home with his wife who is in good health.      Recommendations for follow up therapy are one component of a multi-disciplinary discharge planning process, led by the attending physician.  Recommendations may be updated based on patient status, additional functional criteria and insurance authorization.   Follow Up Recommendations  Home health OT;Supervision/Assistance - 24 hour    Equipment Recommendations  3 in 1 bedside commode    Recommendations for Other Services       Precautions / Restrictions Precautions Precautions: Sternal;Fall Precaution Comments: verbally educated in sternal precautions      Mobility Bed Mobility Overal bed mobility: Needs Assistance Bed Mobility: Supine to Sit     Supine to sit: Min assist     General bed mobility comments: assist to raise trunk    Transfers Overall transfer level: Needs assistance Equipment used: Rolling walker (2 wheeled) Transfers: Sit to/from Stand Sit to Stand: Min assist         General transfer comment: cues for hips to edge of sitting surface and hands on knees, steadying assist with mild posterior bias    Balance Overall balance assessment: Needs assistance   Sitting balance-Leahy Scale: Good     Standing balance support: Bilateral upper extremity supported Standing balance-Leahy  Scale: Poor                             ADL either performed or assessed with clinical judgement   ADL Overall ADL's : Needs assistance/impaired Eating/Feeding: Independent;Sitting   Grooming: Minimal assistance;Standing   Upper Body Bathing: Minimal assistance;Sitting   Lower Body Bathing: Minimal assistance;Sit to/from stand   Upper Body Dressing : Minimal assistance;Sitting   Lower Body Dressing: Minimal assistance;Sit to/from stand Lower Body Dressing Details (indicate cue type and reason): set up for socks Toilet Transfer: Minimal assistance;Ambulation;BSC;RW   Toileting- Clothing Manipulation and Hygiene: Minimal assistance;Sit to/from stand       Functional mobility during ADLs: Minimal assistance;Rolling walker       Vision Baseline Vision/History: 1 Wears glasses Patient Visual Report: No change from baseline       Perception     Praxis      Pertinent Vitals/Pain Pain Assessment: No/denies pain     Hand Dominance Right   Extremity/Trunk Assessment Upper Extremity Assessment Upper Extremity Assessment: Overall WFL for tasks assessed   Lower Extremity Assessment Lower Extremity Assessment: Defer to PT evaluation   Cervical / Trunk Assessment Cervical / Trunk Assessment: Kyphotic (with forward head)   Communication Communication Communication: No difficulties   Cognition Arousal/Alertness: Awake/alert Behavior During Therapy: WFL for tasks assessed/performed Overall Cognitive Status: Within Functional Limits for tasks assessed  General Comments       Exercises     Shoulder Instructions      Home Living Family/patient expects to be discharged to:: Private residence Living Arrangements: Spouse/significant other Available Help at Discharge: Family;Available 24 hours/day Type of Home: House Home Access: Stairs to enter [employer] of Steps: 1   Home Layout: One  level     Bathroom Shower/Tub: Occupational psychologist: Standard     Home Equipment: None          Prior Functioning/Environment Level of Independence: Independent        Comments: drives        OT Problem List: Decreased strength;Impaired balance (sitting and/or standing);Decreased activity tolerance;Decreased knowledge of use of DME or AE;Decreased knowledge of precautions      OT Treatment/Interventions: Self-care/ADL training;Balance training;Patient/family education;Therapeutic activities;DME and/or AE instruction;Energy conservation    OT Goals(Current goals can be found in the care plan section) Acute Rehab OT Goals Patient Stated Goal: return home OT Goal Formulation: With patient Time For Goal Achievement: 09/09/21 Potential to Achieve Goals: Good ADL Goals Pt Will Perform Grooming: with supervision;standing Pt Will Perform Lower Body Bathing: with supervision;sit to/from stand Pt Will Perform Lower Body Dressing: with supervision;sit to/from stand Pt Will Transfer to Toilet: with supervision;ambulating;bedside commode Pt Will Perform Toileting - Clothing Manipulation and hygiene: with supervision;sit to/from stand Additional ADL Goal #1: Pt will state at least 3 energy conservation strategies as instructed. Additional ADL Goal #2: Pt will adhere to sternal precautions independently. Additional ADL Goal #3: Pt will perform bed mobility with supervision in preparation for ADL.  OT Frequency: Min 2X/week   Barriers to D/C:            Co-evaluation              AM-PAC OT "6 Clicks" Daily Activity     Outcome Measure Help from another person eating meals?: None Help from another person taking care of personal grooming?: A Little Help from another person toileting, which includes using toliet, bedpan, or urinal?: A Little Help from another person bathing (including washing, rinsing, drying)?: A Little Help from another person to put on and  taking off regular upper body clothing?: A Little Help from another person to put on and taking off regular lower body clothing?: A Little 6 Click Score: 19   End of Session Equipment Utilized During Treatment: Rolling walker Nurse Communication: Mobility status  Activity Tolerance: Patient tolerated treatment well Patient left: in chair;with call bell/phone within reach  OT Visit Diagnosis: Unsteadiness on feet (R26.81);Other abnormalities of gait and mobility (R26.89);Muscle weakness (generalized) (M62.81)                Time: [id number] OT Time Calculation (min): 22 min Charges:  OT General Charges $OT Visit: 1 Visit OT Evaluation $OT Eval Moderate Complexity: 1 Mod  Nestor Lewandowsky, OTR/L Acute Rehabilitation Services Pager: [phone] Office: [phone]  Malka So 08/26/2021, 4:19 PM

## 2021-08-26 NOTE — Evaluation (Signed)
Physical Therapy Evaluation Patient Details Name: Robert Osborne MRN: [id number] DOB: 09/25/1946 Today's Date: 08/26/2021  History of Present Illness  Pt is a 75 year old admitted on 08/15/21 with chest pain, tachypneic and bradycardia. + NSTEMI. Underwent CABG on 08/23/21. PMH: HTN, HD, BPH, DM2.   Clinical Impression  Pt in bed upon arrival of PT, agreeable to evaluation at this time. Prior to admission the pt was completely independent with mobility without need for AD, but reports recent progressive weakness and deficits in endurance. The pt now presents with limitations in functional mobility, power, endurance, and dynamic stability due to above dx, and will continue to benefit from skilled PT to address these deficits. The pt was able to complete sit-stand transfers with minA but requires use of momentum and minA to steady due to posterior bias with initial stand. He was able to complete good bout of hallway ambulation, but required minA to steady even with BUE support on RW. Recommend continued skilled PT acutely and through [place] following d/c to facilitate return to prior level of independence and mobility.         Recommendations for follow up therapy are one component of a multi-disciplinary discharge planning process, led by the attending physician.  Recommendations may be updated based on patient status, additional functional criteria and insurance authorization.  Follow Up Recommendations Home health PT;Supervision for mobility/OOB    Equipment Recommendations  Rolling walker with 5" wheels;3in1 (PT)    Recommendations for Other Services       Precautions / Restrictions Precautions Precautions: Sternal;Fall Precaution Comments: verbally educated in sternal precautions Restrictions Weight Bearing Restrictions: Yes RUE Weight Bearing: Partial weight bearing LUE Weight Bearing: Partial weight bearing Other Position/Activity Restrictions: sternal precautions      Mobility   Bed Mobility Overal bed mobility: Needs Assistance Bed Mobility: Supine to Sit     Supine to sit: Min assist     General bed mobility comments: pt OOB in recliner at start and end of session    Transfers Overall transfer level: Needs assistance Equipment used: Rolling walker (2 wheeled) Transfers: Sit to/from Stand Sit to Stand: Min assist         General transfer comment: cues for hips to edge of sitting surface and hands on knees, steadying assist with mild posterior bias  Ambulation/Gait Ambulation/Gait assistance: Min assist Gait Distance (Feet): 75 Feet Assistive device: Rolling walker (2 wheeled) Gait Pattern/deviations: Step-through pattern;Decreased stride length;Wide base of support Gait velocity: decreased   General Gait Details: pt with slow gait with significant lateral movements, no overt LOB with BUE support on RW      Balance Overall balance assessment: Needs assistance   Sitting balance-Leahy Scale: Good     Standing balance support: Bilateral upper extremity supported Standing balance-Leahy Scale: Poor                               Pertinent Vitals/Pain Pain Assessment: No/denies pain    Home Living Family/patient expects to be discharged to:: Private residence Living Arrangements: Spouse/significant other Available Help at Discharge: Family;Available 24 hours/day Type of Home: House Home Access: Stairs to enter   [employer] of Steps: 1 Home Layout: One level Home Equipment: None      Prior Function Level of Independence: Independent         Comments: drives     Hand Dominance   Dominant Hand: Right    Extremity/Trunk Assessment  Upper Extremity Assessment Upper Extremity Assessment: Overall WFL for tasks assessed    Lower Extremity Assessment Lower Extremity Assessment: Overall WFL for tasks assessed    Cervical / Trunk Assessment Cervical / Trunk Assessment: Kyphotic (with forward head)   Communication   Communication: No difficulties  Cognition Arousal/Alertness: Awake/alert Behavior During Therapy: WFL for tasks assessed/performed Overall Cognitive Status: Within Functional Limits for tasks assessed                                        General Comments General comments (skin integrity, edema, etc.): VSS on RA< HR to 111, SpO2 93% on RA with ambulation    Exercises     Assessment/Plan    PT Assessment Patient needs continued PT services  PT Problem List Decreased strength;Decreased range of motion;Decreased activity tolerance;Decreased balance;Decreased mobility;Decreased coordination       PT Treatment Interventions DME instruction;Gait training;Stair training;Functional mobility training;Therapeutic activities;Therapeutic exercise;Balance training;Patient/family education    PT Goals (Current goals can be found in the Care Plan section)  Acute Rehab PT Goals Patient Stated Goal: return home PT Goal Formulation: With patient Time For Goal Achievement: 09/09/21 Potential to Achieve Goals: Good    Frequency Min 3X/week    AM-PAC PT "6 Clicks" Mobility  Outcome Measure Help needed turning from your back to your side while in a flat bed without using bedrails?: A Little Help needed moving from lying on your back to sitting on the side of a flat bed without using bedrails?: A Little Help needed moving to and from a bed to a chair (including a wheelchair)?: A Little Help needed standing up from a chair using your arms (e.g., wheelchair or bedside chair)?: A Little Help needed to walk in hospital room?: A Little Help needed climbing 3-5 steps with a railing? : A Little 6 Click Score: 18    End of Session Equipment Utilized During Treatment: Gait belt Activity Tolerance: Patient tolerated treatment well Patient left: in chair;with call bell/phone within reach;with chair alarm set Nurse Communication: Mobility status PT Visit Diagnosis:  Other abnormalities of gait and mobility (R26.89)    Time: [id number] PT Time Calculation (min) (ACUTE ONLY): 18 min   Charges:   PT Evaluation $PT Eval Low Complexity: 1 Low          West Carbo, PT, DPT   Acute Rehabilitation Department Pager #: [phone]  Sandra Cockayne 08/26/2021, 4:54 PM

## 2021-08-26 NOTE — Progress Notes (Signed)
  Speech Language Pathology Treatment: Dysphagia  Patient Details Name: Robert Osborne MRN: [id number] DOB: 07/24/1946 Today's Date: 08/26/2021 Time: 5747-3403 SLP Time Calculation (min) (ACUTE ONLY): 15 min  Assessment / Plan / Recommendation Clinical Impression  Pt demonstrates general improvement in ability to protect airway for swallowing. His vocal quality is clear, he is able to cough effectively on command with minimal discomfort. He is alert and well positioned. Pt consumed 3 oz of water consecutively x2 and then followed with small single sips, with no signs of aspiration or dysphagia. Also consumed full puree cup without impairment. Pt has a small ileus per RN and so full solids were not trialed, but see no concerns for oral impairment that would prevent adequate mastication. Pt has dentures and oral motor strength is adequate. Recommend pt initiate diet per MD, could start with full liquids and advance to regular solids if needed. Would suggest removal of NG as soon as possible for more comfortable safer oral intake. No SLP f/u needed at this time given improvement to normal function.   HPI HPI: Patient is a 75 y.o. male with PMH: HTN, HLD, BPH, DM-2 who presented to ED  on 9/1 due to several day onset of chest pain which suddenly got worse day of admission. In ED, patient was intermittently tachypneic and bradycardic, Chest x-ray showed no acute abnormalities. On 9/2 he underwent surgery for diagnosis of NSTEMI and had Left heart Cath and Coronary Angiography and CABG. on 9/9, RN observed patient with difficulty swallowing. SLP ordered to complete BSE to determine if patient would require temporary non-oral nutrition.      SLP Plan  All goals met       Recommendations  Diet recommendations: Other(comment) (regular/thin - ok to advance as tolerated) Liquids provided via: Straw Medication Administration: Whole meds with liquid                Oral Care Recommendations: Oral care  QID;Oral care prior to ice chip/H20 Follow up Recommendations: 24 hour supervision/assistance SLP Visit Diagnosis: Dysphagia, unspecified (R13.10) Plan: All goals met       GO               Robert Baltimore, MA CCC-SLP  Acute Rehabilitation Services Pager [phone] Office [phone]  Lynann Beaver 08/26/2021, 1:27 PM

## 2021-08-26 NOTE — Progress Notes (Addendum)
EVENING ROUNDS NOTE :     [address]       [place] [address]             [phone]                 4 Days Post-Op Procedure(s) (LRB): CORONARY ARTERY BYPASS GRAFTING (CABG), ON PUMP, TIMES FOUR , USING LEFT INTERNAL MAMMARY ARTERY AND ENDOSCOPICALLY HARVESTED RIGHT GREATER SAPHENOUS VEIN (N/A) TRANSESOPHAGEAL ECHOCARDIOGRAM (TEE) (N/A) APPLICATION OF CELL SAVER ENDOVEIN HARVEST OF GREATER SAPHENOUS VEIN  Total Length of Stay:  LOS: 10 days  BP 130/84   Pulse 87   Temp 98.4 F (36.9 C) (Oral)   Resp 18   Ht '5\' 7"'$  (1.702 m)   Wt 70.7 kg   SpO2 94%   BMI 24.41 kg/m   .Intake/Output      09/11 0701 09/12 0700 09/12 0701 09/13 0700   I.V. (mL/kg) 460 (6.5)    NG/GT     IV Piggyback 100    Total Intake(mL/kg) 560 (7.9)    Urine (mL/kg/hr) 950 (0.6) 800 (1.4)   Stool  0   Total Output 950 800   Net -390 -800        Stool Occurrence  2 x      sodium chloride 20 mL/hr at 08/26/21 0600   sodium chloride     sodium chloride 20 mL/hr at 08/24/21 1335   feeding supplement (OSMOLITE 1.5 CAL) Stopped (08/25/21 2000)   lactated ringers     lactated ringers Stopped (08/24/21 1333)     Lab Results  Component Value Date   WBC 8.8 08/25/2021   HGB 9.0 (L) 08/25/2021   HCT 28.3 (L) 08/25/2021   PLT 209 08/25/2021   GLUCOSE 254 (H) 08/25/2021   CHOL 180 08/16/2021   TRIG 89 08/16/2021   HDL 34 (L) 08/16/2021   LDLCALC 128 (H) 08/16/2021   ALT 20 08/16/2021   AST 64 (H) 08/16/2021   NA 135 08/25/2021   K 4.4 08/25/2021   CL 104 08/25/2021   CREATININE 1.25 (H) 08/25/2021   BUN 24 (H) 08/25/2021   CO2 26 08/25/2021   TSH 0.564 07/29/2019   PSA 2.25 05/17/2013   INR 1.4 (H) 08/22/2021   HGBA1C 8.0 (H) 08/15/2021   MICROALBUR 20 08/10/2015  Patient partially removed NGT again [facility] for liquids per speech pathology recommendations;will discuss with Dr. Kipp Brood. Patient with bowel movement x 2 Patient slowly improving;hope to transfer to 4E in  am  Lars Pinks PA-C 08/26/2021 3:20 PM   Patient seen and examined, agree with above  Remo Lipps C. Roxan Hockey, MD [facility] [phone]

## 2021-08-26 NOTE — Progress Notes (Addendum)
TCTS DAILY ICU PROGRESS NOTE                   [address]            [place] [address]          [phone]   4 Days Post-Op Procedure(s) (LRB): CORONARY ARTERY BYPASS GRAFTING (CABG), ON PUMP, TIMES FOUR , USING LEFT INTERNAL MAMMARY ARTERY AND ENDOSCOPICALLY HARVESTED RIGHT GREATER SAPHENOUS VEIN (N/A) TRANSESOPHAGEAL ECHOCARDIOGRAM (TEE) (N/A) APPLICATION OF CELL SAVER ENDOVEIN HARVEST OF GREATER SAPHENOUS VEIN  Total Length of Stay:  LOS: 10 days   Subjective: Patient sitting in chair. He is alert, oriented this am. He pulled out NGT yesterday and it was re inserted last night.   Objective: Vital signs in last 24 hours: Temp:  [98.1 F (36.7 C)-100.2 F (37.9 C)] 98.3 F (36.8 C) (09/12 0724) Pulse Rate:  [76-99] 98 (09/12 0500) Cardiac Rhythm: Normal sinus rhythm (09/12 0400) Resp:  [17-28] 24 (09/12 0500) BP: (110-143)/(62-108) 143/108 (09/12 0500) SpO2:  [99 %-100 %] 100 % (09/12 0500) Weight:  [70.7 kg] 70.7 kg (09/12 0445)  Filed Weights   08/24/21 0500 08/25/21 0500 08/26/21 0445  Weight: 69.8 kg 69.9 kg 70.7 kg    Weight change: 0.8 kg   Hemodynamic parameters for last 24 hours:    Intake/Output from previous day: 09/11 0701 - 09/12 0700 In: 560 [I.V.:460; IV Piggyback:100] Out: 950 [Urine:950]  Intake/Output this shift: No intake/output data recorded.  Current Meds: Scheduled Meds:  acetaminophen  1,000 mg Oral Q6H   Or   acetaminophen (TYLENOL) oral liquid 160 mg/5 mL  1,000 mg Per Tube Q6H   aspirin EC  81 mg Oral Daily   bisacodyl  10 mg Oral Daily   Or   bisacodyl  10 mg Rectal Daily   bisacodyl  10 mg Rectal Once   chlorhexidine gluconate (MEDLINE KIT)  15 mL Mouth Rinse BID   Chlorhexidine Gluconate Cloth  6 each Topical Daily   clopidogrel  75 mg Oral Daily   docusate sodium  200 mg Oral Daily   feeding supplement (PROSource TF)  45 mL Per Tube Daily   furosemide  40 mg Oral Daily   insulin aspart  0-15 Units  Subcutaneous Q4H   insulin detemir  20 Units Subcutaneous BID   isosorbide mononitrate  30 mg Oral Daily   mouth rinse  15 mL Mouth Rinse BID   metoCLOPramide (REGLAN) injection  10 mg Intravenous Q8H   metoprolol tartrate  25 mg Oral BID   OLANZapine zydis  10 mg Oral QHS   pantoprazole  40 mg Oral Daily   potassium chloride  40 mEq Oral Daily   rosuvastatin  40 mg Oral Daily   sodium chloride flush  3 mL Intravenous Q12H   tamsulosin  0.4 mg Oral Daily   Continuous Infusions:  sodium chloride 20 mL/hr at 08/26/21 0600   sodium chloride     sodium chloride 20 mL/hr at 08/24/21 1335   feeding supplement (OSMOLITE 1.5 CAL) Stopped (08/25/21 2000)   lactated ringers     lactated ringers Stopped (08/24/21 1333)   PRN Meds:.sodium chloride, dextrose, metoprolol tartrate, morphine injection, nitroGLYCERIN, ondansetron (ZOFRAN) IV, oxyCODONE, sodium chloride flush, traMADol  General appearance: alert, cooperative, and no distress Heart: RRR Lungs: Coarse breath sounds L>R Abdomen: Semi soft, protuberant, high pitched sporadic bowel sounds Extremities: Mild LE edema Wound: Sternal wound is clean and dry.  Right EVH wound clean  and dry.  Lab Results: CBC: Recent Labs    08/24/21 0322 08/25/21 0420  WBC 10.0 8.8  HGB 8.8* 9.0*  HCT 26.4* 28.3*  PLT 192 209   BMET:  Recent Labs    08/24/21 0322 08/25/21 0420  NA 132* 135  K 4.2 4.4  CL 101 104  CO2 23 26  GLUCOSE 309* 254*  BUN 22 24*  CREATININE 1.57* 1.25*  CALCIUM 8.4* 8.4*    CMET: Lab Results  Component Value Date   WBC 8.8 08/25/2021   HGB 9.0 (L) 08/25/2021   HCT 28.3 (L) 08/25/2021   PLT 209 08/25/2021   GLUCOSE 254 (H) 08/25/2021   CHOL 180 08/16/2021   TRIG 89 08/16/2021   HDL 34 (L) 08/16/2021   LDLCALC 128 (H) 08/16/2021   ALT 20 08/16/2021   AST 64 (H) 08/16/2021   NA 135 08/25/2021   K 4.4 08/25/2021   CL 104 08/25/2021   CREATININE 1.25 (H) 08/25/2021   BUN 24 (H) 08/25/2021   CO2 26  08/25/2021   TSH 0.564 07/29/2019   PSA 2.25 05/17/2013   INR 1.4 (H) 08/22/2021   HGBA1C 8.0 (H) 08/15/2021   MICROALBUR 20 08/10/2015      PT/INR: No results for input(s): LABPROT, INR in the last 72 hours. Radiology: DG Abd 1 View  Result Date: 08/25/2021 CLINICAL DATA:  NG tube placement EXAM: ABDOMEN - 1 VIEW COMPARISON:  August 25, 2021. FINDINGS: Airspace disease at the LEFT lung base similar to recent radiograph. Gastric tube, tip in the proximal stomach side port at the EG junction. Bowel gas pattern with scattered gas-filled loops of bowel throughout the abdomen. No overt signs of obstruction. Calcific density over the RIGHT upper quadrant likely a gallstone. On limited assessment there is no acute skeletal process. IMPRESSION: Gastric tube in the proximal stomach side port at the EG junction. Recommend advancing a few centimeters to ensure intragastric placement. Airspace disease at the LEFT lung base. Potential gallstone. Scattered loops of bowel in the abdomen, gas-filled may reflect ileus. Electronically Signed   By: Zetta Bills M.D.   On: 08/25/2021 21:20   DG Abd Portable 1V  Result Date: 08/25/2021 CLINICAL DATA:  Enteric tube placement. EXAM: PORTABLE ABDOMEN - 1 VIEW COMPARISON:  Abdominal x-ray dated August 23, 2021. FINDINGS: Enteric tube looped back on itself at the GE junction with the tip in the distal esophagus. Right-sided central venous catheter with tip in the distal SVC. Stable cardiomediastinal silhouette status post CABG. Similar bibasilar atelectasis with trace pleural effusions. IMPRESSION: 1. Enteric tube looped back on itself at the GE junction with the tip in the distal esophagus. Recommend repositioning. Electronically Signed   By: Titus Dubin M.D.   On: 08/25/2021 15:09     Assessment/Plan: S/P Procedure(s) (LRB): CORONARY ARTERY BYPASS GRAFTING (CABG), ON PUMP, TIMES FOUR , USING LEFT INTERNAL MAMMARY ARTERY AND ENDOSCOPICALLY HARVESTED RIGHT  GREATER SAPHENOUS VEIN (N/A) TRANSESOPHAGEAL ECHOCARDIOGRAM (TEE) (N/A) APPLICATION OF CELL SAVER ENDOVEIN HARVEST OF GREATER SAPHENOUS VEIN  CV-S/p NSTEMI. SR, hypertensive. On Imdur 30 mg daily, Lopressor 25 mg bid, and Plavix. Pulmonary-On 2 liters of oxygen via [place]. Wean as able. On Cefepime for possible HCAP. Per Dr. Kipp Brood. Will stop. Encourage incentive spirometer Volume overload-per Dr. Kipp Brood, Lasix 40 mg today Expected ABL anemia-Last H and H stable at 9 and 28.3 GI-possible ileus on KUB done yesterday. Had bowel movement over the weekend. On Reglan. NGT in place;to remain for now. NPO, TFs. Speech pathology  to evaluate for now. DM-CBGs 72/82/169. Pre op HGA1C 8. He will need close medical follow up after discharge. 7. Last creatinine down from 1.57 to 1.25. Re check in am 8. Deconditioned-PT evaluation ordered 9. Patient to remain in ICU for close monitoring for now    Nani Skillern PA-C 08/26/2021 8:04 AM   Agree with above Continue PT Speech cleared for regular diet Floor tomorrow  Lajuana Matte

## 2021-08-27 ENCOUNTER — Inpatient Hospital Stay (HOSPITAL_COMMUNITY): Payer: Medicare HMO

## 2021-08-27 LAB — BASIC METABOLIC PANEL
Anion gap: 10 (ref 5–15)
BUN: 22 mg/dL (ref 8–23)
CO2: 24 mmol/L (ref 22–32)
Calcium: 8.7 mg/dL — ABNORMAL LOW (ref 8.9–10.3)
Chloride: 102 mmol/L (ref 98–111)
Creatinine, Ser: 1.28 mg/dL — ABNORMAL HIGH (ref 0.61–1.24)
GFR, Estimated: 58 mL/min — ABNORMAL LOW (ref >60)
Glucose, Bld: 130 mg/dL — ABNORMAL HIGH (ref 70–99)
Potassium: 4.6 mmol/L (ref 3.5–5.1)
Sodium: 136 mmol/L (ref 135–145)

## 2021-08-27 LAB — ECHO INTRAOPERATIVE TEE
Height: 67 in
Single Plane A2C EF: 53.3 %
Weight: 2317.48 oz

## 2021-08-27 LAB — GLUCOSE, CAPILLARY
Glucose-Capillary: 69 mg/dL — ABNORMAL LOW (ref 70–99)
Glucose-Capillary: 141 mg/dL — ABNORMAL HIGH (ref 70–99)
Glucose-Capillary: 129 mg/dL — ABNORMAL HIGH (ref 70–99)
Glucose-Capillary: 166 mg/dL — ABNORMAL HIGH (ref 70–99)
Glucose-Capillary: 79 mg/dL (ref 70–99)

## 2021-08-27 IMAGING — DX DG CHEST 1V PORT
1 series · 1 of 1 positions shown · non-contrast
Comparison: [DATE].

CLINICAL DATA: Shortness of breath.

EXAM:
PORTABLE CHEST 1 VIEW

[chest]
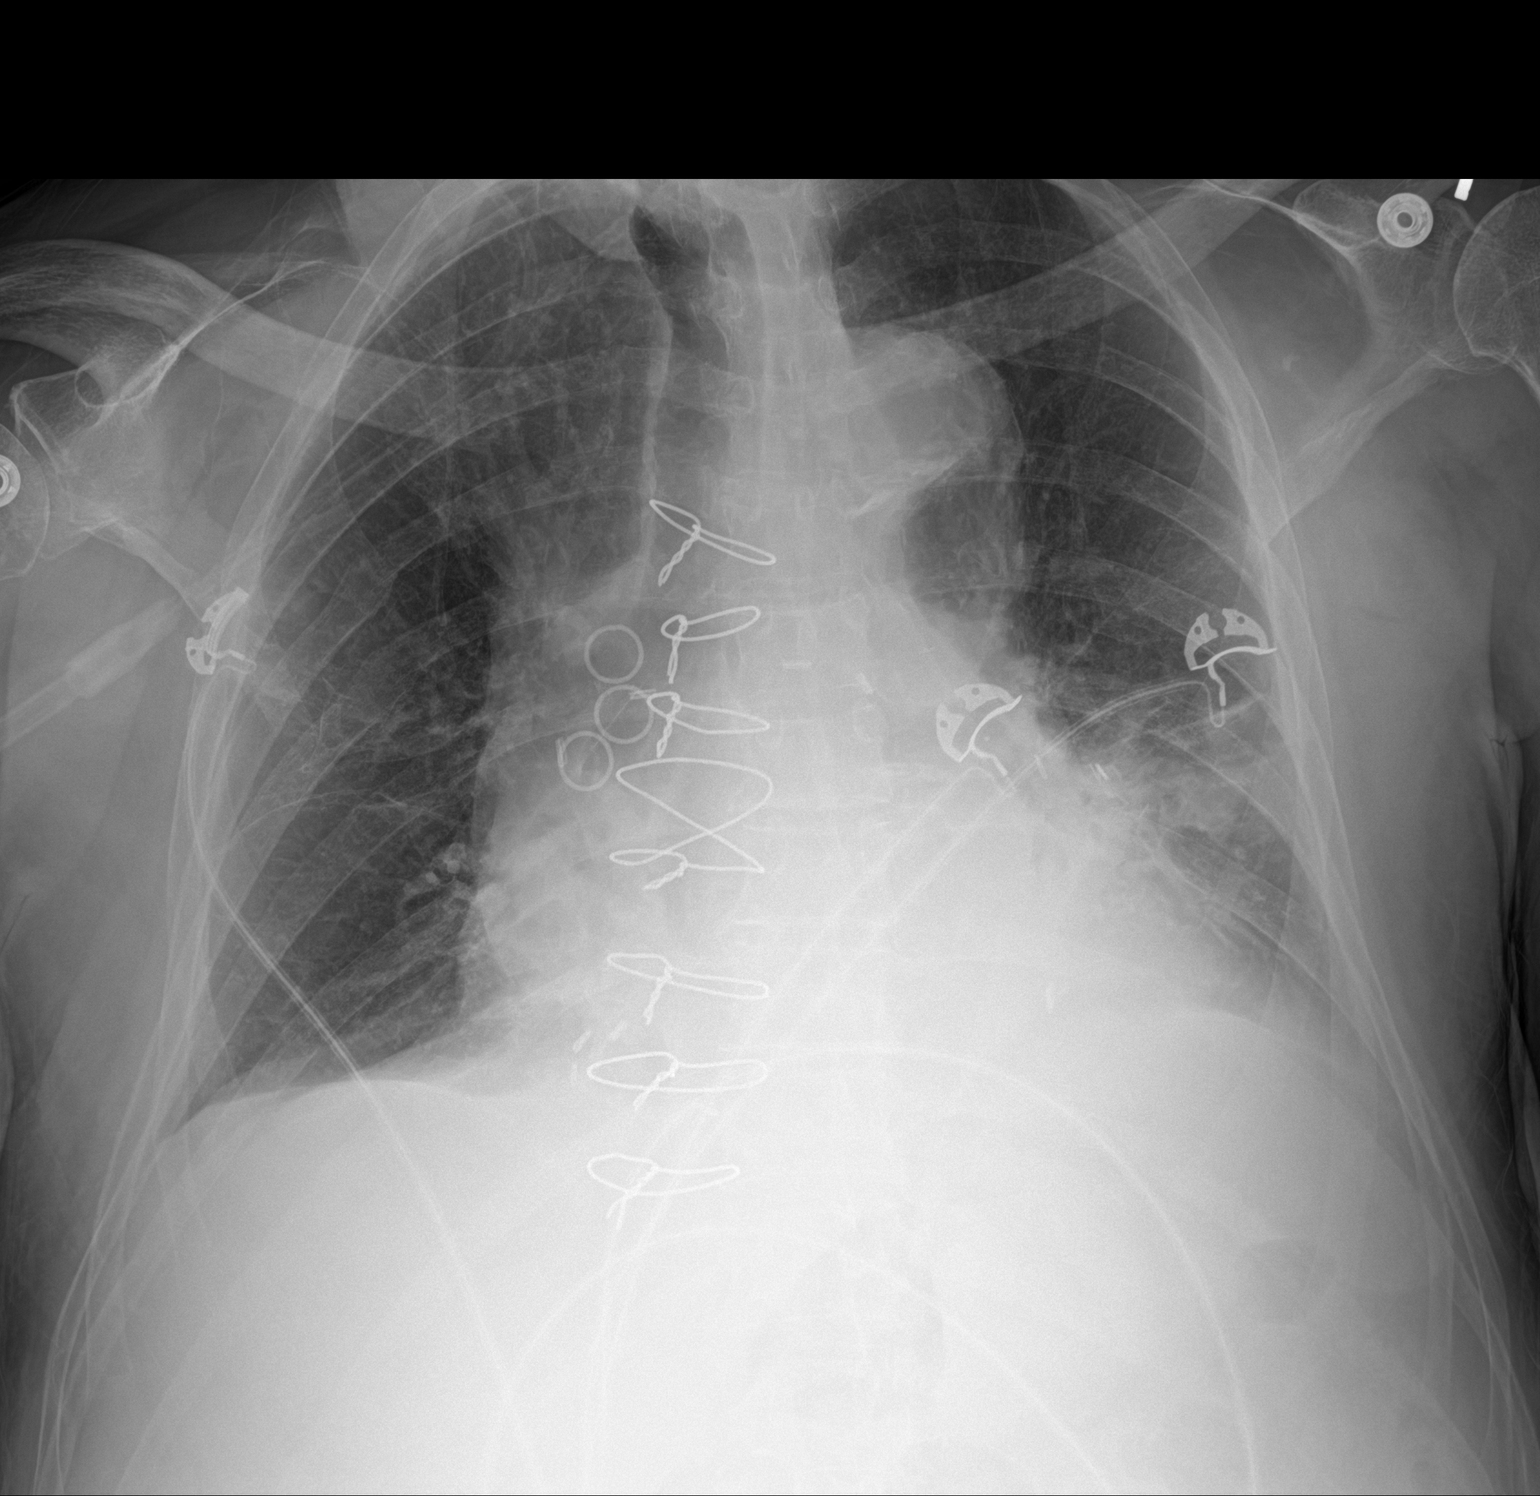

[1 of 1 positions shown; findings below may reference images not displayed]

FINDINGS: Stable cardiomegaly. Status post coronary bypass graft. Stable left
lung opacity is noted concerning for atelectasis or scarring.
Minimal right basilar atelectasis is noted. Bony thorax is
unremarkable. No pneumothorax is noted.
IMPRESSION: Stable bibasilar opacities are noted, left greater than right.

## 2021-08-27 MED ORDER — INSULIN ASPART 100 UNIT/ML IJ SOLN
0.0000 [IU] | Freq: Three times a day (TID) | INTRAMUSCULAR | Status: DC
  Administered 2021-08-27 – 2021-08-28 (×3): 3 [IU] via SUBCUTANEOUS
  Administered 2021-08-29: 8 [IU] via SUBCUTANEOUS
  Administered 2021-08-29: 3 [IU] via SUBCUTANEOUS

## 2021-08-27 MED ORDER — METOPROLOL TARTRATE 25 MG PO TABS
37.5000 mg | ORAL_TABLET | Freq: Two times a day (BID) | ORAL | Status: DC
  Administered 2021-08-27 (×2): 37.5 mg via ORAL
  Filled 2021-08-27 (×2): qty 1

## 2021-08-27 MED ORDER — INSULIN DETEMIR 100 UNIT/ML ~~LOC~~ SOLN
15.0000 [IU] | Freq: Two times a day (BID) | SUBCUTANEOUS | Status: DC
  Administered 2021-08-27: 15 [IU] via SUBCUTANEOUS
  Filled 2021-08-27 (×4): qty 0.15

## 2021-08-27 MED ORDER — CHLORHEXIDINE GLUCONATE 0.12 % MT SOLN
OROMUCOSAL | Status: AC
  Filled 2021-08-27: qty 15

## 2021-08-27 MED ORDER — ~~LOC~~ CARDIAC SURGERY, PATIENT & FAMILY EDUCATION: Freq: Once | Status: AC

## 2021-08-27 MED ORDER — SODIUM CHLORIDE 0.9% FLUSH: 3.0000 mL | INTRAVENOUS | Status: DC | PRN

## 2021-08-27 MED ORDER — SODIUM CHLORIDE 0.9% FLUSH
3.0000 mL | Freq: Two times a day (BID) | INTRAVENOUS | Status: DC
  Administered 2021-08-27 – 2021-08-28 (×4): 3 mL via INTRAVENOUS

## 2021-08-27 MED ORDER — ENSURE ENLIVE PO LIQD
237.0000 mL | Freq: Two times a day (BID) | ORAL | Status: DC
  Administered 2021-08-28 – 2021-08-29 (×4): 237 mL via ORAL

## 2021-08-27 MED ORDER — ADULT MULTIVITAMIN W/MINERALS CH
1.0000 | ORAL_TABLET | Freq: Every day | ORAL | Status: DC
  Administered 2021-08-27 – 2021-08-30 (×4): 1 via ORAL
  Filled 2021-08-27 (×4): qty 1

## 2021-08-27 MED ORDER — SODIUM CHLORIDE 0.9 % IV SOLN: 250.0000 mL | INTRAVENOUS | Status: DC | PRN

## 2021-08-27 NOTE — Addendum Note (Signed)
Addendum  created 08/27/21 1329 by Myrtie Soman, MD   Child order released for a procedure order, Clinical Note Signed, Intraprocedure Blocks edited, SmartForm saved

## 2021-08-27 NOTE — Anesthesia Procedure Notes (Signed)
Anesthesia Procedure Image    

## 2021-08-27 NOTE — Progress Notes (Signed)
CARDIAC REHAB PHASE I   PRE:  Rate/Rhythm: 83 SR  BP:  Supine: 122/57  Sitting:   Standing:    SaO2: 96-98%RA  MODE:  Ambulation: 170 ft   POST:  Rate/Rhythm: 96 SR  BP:  Supine:   Sitting: 114/71  Standing:    SaO2: 97%RA 1252-1325 Pt encouraged to adhere to sternal precautions. Pt motivated to walk. Stood mainly by himself with rocking. Pt walked 170 ft on RA with EVA,gait belt use, and asst x 2. Did not need to take a standing break. Encouraged to look up. To recliner with call bell after walk. Tolerated well.   Graylon Good, RN BSN  08/27/2021 1:20 PM

## 2021-08-27 NOTE — TOC Initial Note (Signed)
Transition of Care [facility]) - Initial/Assessment Note    Patient Details  Name: Robert Osborne MRN: [id number] Date of Birth: 07/30/1946  Transition [facility]) CM/SW Contact:    Bethena Roys, RN Phone Number: 08/27/2021, 12:34 PM  Clinical Narrative: Patient is POD-5 CABG. Prior to hospitalization, the patient was from home with spouse. Plan will be to return home with home health services. Case Manager was able to speak with the patient's wife regarding home health services. The spouse will be at home with the patient and she is aware of the recommendations for supervision. Case Manager discussed the disciplines that would be able to assist the patient in the home. Spouse is agreeable to home health PT/OT, Aide-please follow for orders and F2F. Spouse did not have a preference regarding agency- Case Manager called [facility] which is in network with [employer]. Referral made for the above disciplines and start of care to begin within 24-48 hours post transition home. Patient will benefit from a rolling walker and bedside commode-orders needed. Spouse is agreeable to using [employer] for durable medical equipment. Per spouse the daughter will transport the patient home once stable. Case Manager will continue to follow.              Expected Discharge Plan: [place] Barriers to Discharge: Continued Medical Work up   Patient Goals and CMS Choice Patient states their goals for this hospitalization and ongoing recovery are:: to return home   Choice offered to / list presented to : Spouse (Spouse did not have a preference.)  Expected Discharge Plan and Services Expected Discharge Plan: [place]   Discharge Planning Services: CM Consult Post Acute Care Choice: Durable Medical Equipment, Home Health Living arrangements for the past 2 months: Single Family Home                 DME Arranged: Walker rolling, Bedside commode (Please follow for  orders.)         HH Arranged: PT, OT, Nurse's Aide HH Agency: [place] Date [facility] Agency Contacted: 08/27/21 Time HH Agency Contacted: 69 Representative spoke with at [place]: [place] Arrangements/Services Living arrangements for the past 2 months: [place] with:: Spouse Patient language and need for interpreter reviewed:: Yes Do you feel safe going back to the place where you live?: Yes      Need for Family Participation in Patient Care: Yes (Comment) Care giver support system in place?: Yes (comment)   Criminal Activity/Legal Involvement Pertinent to Current Situation/Hospitalization: No - Comment as needed  Activities of Daily Living Home Assistive Devices/Equipment: Dentures (specify type), CBG Meter ADL Screening (condition at time of admission) Patient's cognitive ability adequate to safely complete daily activities?: No Is the patient deaf or have difficulty hearing?: Yes Does the patient have difficulty seeing, even when wearing glasses/contacts?: Yes Does the patient have difficulty concentrating, remembering, or making decisions?: Yes Patient able to express need for assistance with ADLs?: Yes Does the patient have difficulty dressing or bathing?: No Independently performs ADLs?: Yes (appropriate for developmental age) Does the patient have difficulty walking or climbing stairs?: No Weakness of Legs: Both Weakness of Arms/Hands: None  Permission Sought/Granted Permission sought to share information with : Family Supports, Customer service manager, Case Optician, dispensing granted to share information with : Yes, Verbal Permission Granted     Permission granted to share info w AGENCY: [place]  Emotional Assessment Appearance:: Appears stated age Attitude/Demeanor/Rapport: Engaged Affect (typically observed): Appropriate Orientation: : Oriented to Situation, Oriented to  Time, Oriented to  Place, Oriented to Self Alcohol / Substance Use: Not Applicable Psych Involvement: No (comment)  Admission diagnosis:  NSTEMI (non-ST elevated myocardial infarction) ([place]) [I21.4] Chest pain [R07.9] Chest pain, unspecified type [R07.9] Acute anterior wall MI ([place]) [I21.09] Patient Active Problem List   Diagnosis Date Noted   S/P CABG x 4 08/23/2021   Acute anterior wall MI ([place]) 08/16/2021   NSTEMI (non-ST elevated myocardial infarction) ([place])    Chest pain 08/15/2021   Right upper lobe pulmonary nodule 08/15/2021   Hypoalbuminemia due to protein-calorie malnutrition ([place]) 08/15/2021   AKI (acute kidney injury) ([place]) 08/15/2021   Hyperglycemia due to diabetes mellitus ([employer]) 08/15/2021   BPH (benign prostatic hyperplasia) 08/15/2021   Generalized weakness    Neurological deficit present    Facial droop 08/17/2019   Hx of bladder cancer 07/29/2019   Abnormal stress test 08/11/2018   PVC's (premature ventricular contractions) 08/11/2018   Hypokalemia 11/27/2017   Hypomagnesemia 11/27/2017   Major frontotemporal neurocognitive disorder, probable, with behavioral disturbance 11/27/2017   Erectile dysfunction 10/24/2016   Bladder cancer ([place]) 08/18/2013   Diabetes mellitus ([place])    Hypertension associated with diabetes ([place])    Vitamin D deficiency 05/17/2013   Hyperlipidemia associated with type 2 diabetes mellitus ([place]) 05/17/2013   PCP:  System, Provider Not In Pharmacy:   [facility] [address] [place] [address] [place] [address] Phone: [phone] Fax: [phone]  [id number] [place] - [address]: [phone]: [phone]  Readmission Risk Interventions Readmission Risk Prevention Plan 08/27/2021  Transportation Screening Complete  PCP or Specialist Appt within 3-5 Days Complete  HRI or Home Care Consult Complete  Social Work Consult for [place] Planning/Counseling Complete  Palliative Care Screening Not Applicable  Medication Review (Press photographer Complete  Some recent data might be hidden

## 2021-08-27 NOTE — Progress Notes (Addendum)
EVENING ROUNDS NOTE :     [address]       [place] [address]             [phone]                 5 Days Post-Op Procedure(s) (LRB): CORONARY ARTERY BYPASS GRAFTING (CABG), ON PUMP, TIMES FOUR , USING LEFT INTERNAL MAMMARY ARTERY AND ENDOSCOPICALLY HARVESTED RIGHT GREATER SAPHENOUS VEIN (N/A) TRANSESOPHAGEAL ECHOCARDIOGRAM (TEE) (N/A) APPLICATION OF CELL SAVER ENDOVEIN HARVEST OF GREATER SAPHENOUS VEIN  Total Length of Stay:  LOS: 11 days  BP 114/71   Pulse 93   Temp 98.2 F (36.8 C) (Oral)   Resp (!) 28   Ht '5\' 7"'$  (1.702 m)   Wt 68.3 kg   SpO2 96%   BMI 23.58 kg/m   .Intake/Output      09/12 0701 09/13 0700 09/13 0701 09/14 0700   P.O. 240 240   I.V. (mL/kg)     IV Piggyback     Total Intake(mL/kg) 240 (3.5) 240 (3.5)   Urine (mL/kg/hr) 3000 (1.8) 675 (1.1)   Stool 0 0   Total Output 3000 675   Net -2760 -435        Urine Occurrence 1 x 1 x   Stool Occurrence 2 x 1 x      sodium chloride 20 mL/hr at 08/26/21 0600   sodium chloride     sodium chloride 20 mL/hr at 08/24/21 1335   sodium chloride       Lab Results  Component Value Date   WBC 8.8 08/25/2021   HGB 9.0 (L) 08/25/2021   HCT 28.3 (L) 08/25/2021   PLT 209 08/25/2021   GLUCOSE 130 (H) 08/27/2021   CHOL 180 08/16/2021   TRIG 89 08/16/2021   HDL 34 (L) 08/16/2021   LDLCALC 128 (H) 08/16/2021   ALT 20 08/16/2021   AST 64 (H) 08/16/2021   NA 136 08/27/2021   K 4.6 08/27/2021   CL 102 08/27/2021   CREATININE 1.28 (H) 08/27/2021   BUN 22 08/27/2021   CO2 24 08/27/2021   TSH 0.564 07/29/2019   PSA 2.25 05/17/2013   INR 1.4 (H) 08/22/2021   HGBA1C 8.0 (H) 08/15/2021   MICROALBUR 20 08/10/2015    Conts to make slow/steady progress O2 has been weaned off  VSS, sinus rhythm Awaits bed on 4e, cont current plan     John Giovanni, PA-C   Awaiting floor bed  Loghan Kurtzman Bary Leriche

## 2021-08-27 NOTE — Progress Notes (Signed)
Inpatient Diabetes Program Recommendations  AACE/ADA: New Consensus Statement on Inpatient Glycemic Control   Target Ranges:  Prepandial:   less than 140 mg/dL      Peak postprandial:   less than 180 mg/dL (1-2 hours)      Critically ill patients:  140 - 180 mg/dL   Results for Robert Osborne, Robert Osborne (MRN [id number]) as of 08/27/2021 11:19  Ref. Range 08/26/2021 07:21 08/26/2021 11:05 08/26/2021 15:11 08/26/2021 19:18 08/26/2021 23:32 08/27/2021 06:20 08/27/2021 06:55  Glucose-Capillary Latest Ref Range: 70 - 99 mg/dL 169 (H)  Novolog 3 units  Levemr 20 units 96 125 (H)  Novolog 2 units 178 (H)  Novolog 3 units  Levemir 20 units 164 (H)  Novolog 3 units 69 (L) 141 (H)    Review of Glycemic Control  Current orders for Inpatient glycemic control: Levemir 20 units BID, Novolog 0-15 units Q4H  Inpatient Diabetes Program Recommendations:    Insulin: Noted glucose 69 mg/dl at 6:20 am today after getting Novolog correction at 00:08. Please consider changing Novolog correction to 0-15 units TID with meals and Novolog 0-5 units QHS.  Thanks, Barnie Alderman, RN, MSN, CDE Diabetes Coordinator Inpatient Diabetes Program [phone] (Team Pager from 8am to 5pm)

## 2021-08-27 NOTE — Progress Notes (Addendum)
Nutrition Follow-up  DOCUMENTATION CODES:   Not applicable  INTERVENTION:   -Liberalize diet to regular to increase intake      - Encourage good PO intake  -Ensure Enlive (chocolate) po BID, each supplement provides 350 kcal and 20 grams of protein  -Multivitamin w/ minerals   NUTRITION DIAGNOSIS:   Inadequate oral intake related to decreased appetite as evidenced by meal completion < 50%. - Ongoing   GOAL:   Patient will meet greater than or equal to 90% of their needs - Progressing    MONITOR:   PO intake, Skin  REASON FOR ASSESSMENT:   Malnutrition Screening Tool - 2    ASSESSMENT:   Pt admitted with chest pain. PMH of bladder cancer, Vitamin D deficiency, and T2DM.  9/02: Coronary Balloon Angioplasty and L Heart Cath & Coronary Angiography 9/08: Coronary Artery Bypass Graft x 4 9/09: NGT placed d/t failed swallow study  9/12: NGT removed; SLP recommend a regular diet w/ thin liquids  Pt reports that he tolerated his TF, it hurt his stomach (distention). Stated that he is hungry and wants to eat; reports that he had applesauce, sprite, and broth for breakfast. Discussed adding an Ensure and pt agreeable.   Pt stomach is distended and tight; per pt he had a BM this morning.   Medications reviewed: NovoLog SSI 0-15 units q4h, Levemir 20 units -BID, Lasix, Potassium Choloride SA - Daily Labs reviewed: 24 hr BG trends 69-178 mg/dL  Diet Order:   Diet Order             Diet heart healthy/carb modified Room service appropriate? Yes; Fluid consistency: Thin  Diet effective now                   EDUCATION NEEDS:   No education needs have been identified at this time  Skin:  Skin Assessment: Skin Integrity Issues: Skin Integrity Issues:: Incisions Incisions: Midline  Last BM:  9/12  Height:   Ht Readings from Last 1 Encounters:  08/22/21 '5\' 7"'$  (1.702 m)    Weight:   Wt Readings from Last 1 Encounters:  08/27/21 68.3 kg    Ideal Body  Weight:  67.3 kg  BMI:  Body mass index is 23.58 kg/m.  Estimated Nutritional Needs:   Kcal:  1900-2100  Protein:  85-100 gm  Fluid:  > 1.9 L    Robert Osborne BS, PLDN Clinical Dietitian See AMiON for contact information.

## 2021-08-27 NOTE — Progress Notes (Addendum)
[address]       [place] [address]             [phone]      5 Days Post-Op Procedure(s) (LRB): CORONARY ARTERY BYPASS GRAFTING (CABG), ON PUMP, TIMES FOUR , USING LEFT INTERNAL MAMMARY ARTERY AND ENDOSCOPICALLY HARVESTED RIGHT GREATER SAPHENOUS VEIN (N/A) TRANSESOPHAGEAL ECHOCARDIOGRAM (TEE) (N/A) APPLICATION OF CELL SAVER ENDOVEIN HARVEST OF GREATER SAPHENOUS VEIN Subjective: Generally feels pretty well, tol liquids but doesn't want to try to advance diet yet  Objective: Vital signs in last 24 hours: Temp:  [97.9 F (36.6 C)-98.4 F (36.9 C)] 98.2 F (36.8 C) (09/13 0400) Pulse Rate:  [60-105] 105 (09/13 0700) Cardiac Rhythm: Normal sinus rhythm (09/12 2000) Resp:  [16-26] 22 (09/13 0700) BP: (118-152)/(68-108) 142/102 (09/13 0700) SpO2:  [73 %-100 %] 99 % (09/13 0700) Weight:  [68.3 kg] 68.3 kg (09/13 0500)  Hemodynamic parameters for last 24 hours:    Intake/Output from previous day: 09/12 0701 - 09/13 0700 In: 240 [P.O.:240] Out: 3000 [Urine:3000] Intake/Output this shift: No intake/output data recorded.  General appearance: alert, cooperative, and no distress Heart: regular rate and rhythm and tachy Lungs: slightly coarse in bases but mostly clear Abdomen: mild/mod distension + hyperactive BS, easily reducable umbilical hernia, slightly tender to palp Extremities: no edema Wound: incis healing well  Lab Results: Recent Labs    08/25/21 0420  WBC 8.8  HGB 9.0*  HCT 28.3*  PLT 209   BMET:  Recent Labs    08/25/21 0420 08/27/21 0016  NA 135 136  K 4.4 4.6  CL 104 102  CO2 26 24  GLUCOSE 254* 130*  BUN 24* 22  CREATININE 1.25* 1.28*  CALCIUM 8.4* 8.7*    PT/INR: No results for input(s): LABPROT, INR in the last 72 hours. ABG    Component Value Date/Time   PHART 7.389 08/22/2021 1908   HCO3 21.3 08/22/2021 1908   TCO2 22 08/22/2021 1908   ACIDBASEDEF 3.0 (H) 08/22/2021 1908   O2SAT 89.0 08/22/2021 1908   CBG  (last 3)  Recent Labs    08/26/21 2332 08/27/21 0620 08/27/21 0655  GLUCAP 164* 69* 141*    Meds Scheduled Meds:  acetaminophen  1,000 mg Oral Q6H   Or   acetaminophen (TYLENOL) oral liquid 160 mg/5 mL  1,000 mg Per Tube Q6H   aspirin EC  81 mg Oral Daily   bisacodyl  10 mg Oral Daily   Or   bisacodyl  10 mg Rectal Daily   chlorhexidine gluconate (MEDLINE KIT)  15 mL Mouth Rinse BID   Chlorhexidine Gluconate Cloth  6 each Topical Daily   clopidogrel  75 mg Oral Daily   [facility], Patient & Family Education   Does not apply Once   docusate sodium  200 mg Oral Daily   furosemide  40 mg Oral Daily   insulin aspart  0-15 Units Subcutaneous Q4H   insulin detemir  20 Units Subcutaneous BID   isosorbide mononitrate  30 mg Oral Daily   mouth rinse  15 mL Mouth Rinse BID   metoprolol tartrate  37.5 mg Oral BID   OLANZapine zydis  10 mg Oral QHS   pantoprazole  40 mg Oral Daily   potassium chloride  40 mEq Oral Daily   rosuvastatin  40 mg Oral Daily   sodium chloride flush  3 mL Intravenous Q12H   sodium chloride flush  3 mL Intravenous Q12H  tamsulosin  0.4 mg Oral Daily   Continuous Infusions:  sodium chloride 20 mL/hr at 08/26/21 0600   sodium chloride     sodium chloride 20 mL/hr at 08/24/21 1335   sodium chloride     PRN Meds:.sodium chloride, sodium chloride, dextrose, metoprolol tartrate, nitroGLYCERIN, ondansetron (ZOFRAN) IV, oxyCODONE, sodium chloride flush, sodium chloride flush, traMADol  Xrays DG Abd 1 View  Result Date: 08/25/2021 CLINICAL DATA:  NG tube placement EXAM: ABDOMEN - 1 VIEW COMPARISON:  August 25, 2021. FINDINGS: Airspace disease at the LEFT lung base similar to recent radiograph. Gastric tube, tip in the proximal stomach side port at the EG junction. Bowel gas pattern with scattered gas-filled loops of bowel throughout the abdomen. No overt signs of obstruction. Calcific density over the RIGHT upper quadrant likely a gallstone.  On limited assessment there is no acute skeletal process. IMPRESSION: Gastric tube in the proximal stomach side port at the EG junction. Recommend advancing a few centimeters to ensure intragastric placement. Airspace disease at the LEFT lung base. Potential gallstone. Scattered loops of bowel in the abdomen, gas-filled may reflect ileus. Electronically Signed   By: Zetta Bills M.D.   On: 08/25/2021 21:20   DG Abd Portable 1V  Result Date: 08/25/2021 CLINICAL DATA:  Enteric tube placement. EXAM: PORTABLE ABDOMEN - 1 VIEW COMPARISON:  Abdominal x-ray dated August 23, 2021. FINDINGS: Enteric tube looped back on itself at the GE junction with the tip in the distal esophagus. Right-sided central venous catheter with tip in the distal SVC. Stable cardiomediastinal silhouette status post CABG. Similar bibasilar atelectasis with trace pleural effusions. IMPRESSION: 1. Enteric tube looped back on itself at the GE junction with the tip in the distal esophagus. Recommend repositioning. Electronically Signed   By: Titus Dubin M.D.   On: 08/25/2021 15:09    Assessment/Plan: S/P Procedure(s) (LRB): CORONARY ARTERY BYPASS GRAFTING (CABG), ON PUMP, TIMES FOUR , USING LEFT INTERNAL MAMMARY ARTERY AND ENDOSCOPICALLY HARVESTED RIGHT GREATER SAPHENOUS VEIN (N/A) TRANSESOPHAGEAL ECHOCARDIOGRAM (TEE) (N/A) APPLICATION OF CELL SAVER ENDOVEIN HARVEST OF GREATER SAPHENOUS VEIN   1 afebrile, VSS sBP 110's-150's , tachy at times- increasing metoprolol. On ASA/plavix 2 sats ok on 3 liters, weaning- push rehab/pulm toilet as able 3 tol clear liquids, mildly/mod distended but abd benign, + BM- has an umbilical hernia but easily reducable 3 creat stable at 1.28, excellent UOP, weight approx 3 kg over preop 4 CXR stable appearance, some improvement in ASD 5 BS adeq control on SSI/levemir, on novolog and tresiba preop- transistion over time- currently only on clear liquid 6 stable for transfer to 4 e    LOS: 11 days     John Giovanni 08/27/2021    Agree with above. Overall doing well. We will keep on a clear liquid diet today. Continue pulmonary hygiene. Will transfer to 4 E.  Deann Mclaine Bary Leriche

## 2021-08-28 ENCOUNTER — Other Ambulatory Visit (HOSPITAL_COMMUNITY): Payer: Self-pay

## 2021-08-28 DIAGNOSIS — I200 Unstable angina: Secondary | ICD-10-CM

## 2021-08-28 LAB — CBC
HCT: 31.4 % — ABNORMAL LOW (ref 39.0–52.0)
Hemoglobin: 10.2 g/dL — ABNORMAL LOW (ref 13.0–17.0)
MCH: 28.1 pg (ref 26.0–34.0)
MCHC: 32.5 g/dL (ref 30.0–36.0)
MCV: 86.5 fL (ref 80.0–100.0)
Platelets: 331 10*3/uL (ref 150–400)
RBC: 3.63 MIL/uL — ABNORMAL LOW (ref 4.22–5.81)
RDW: 14.6 % (ref 11.5–15.5)
WBC: 6.3 10*3/uL (ref 4.0–10.5)
nRBC: 0.3 % — ABNORMAL HIGH (ref 0.0–0.2)

## 2021-08-28 LAB — BASIC METABOLIC PANEL
Anion gap: 10 (ref 5–15)
BUN: 21 mg/dL (ref 8–23)
CO2: 28 mmol/L (ref 22–32)
Calcium: 9 mg/dL (ref 8.9–10.3)
Chloride: 100 mmol/L (ref 98–111)
Creatinine, Ser: 1.22 mg/dL (ref 0.61–1.24)
GFR, Estimated: >60 mL/min (ref >60)
Glucose, Bld: 64 mg/dL — ABNORMAL LOW (ref 70–99)
Potassium: 3.9 mmol/L (ref 3.5–5.1)
Sodium: 138 mmol/L (ref 135–145)

## 2021-08-28 LAB — GLUCOSE, CAPILLARY
Glucose-Capillary: 83 mg/dL (ref 70–99)
Glucose-Capillary: 171 mg/dL — ABNORMAL HIGH (ref 70–99)
Glucose-Capillary: 178 mg/dL — ABNORMAL HIGH (ref 70–99)
Glucose-Capillary: 138 mg/dL — ABNORMAL HIGH (ref 70–99)

## 2021-08-28 MED ORDER — DAPAGLIFLOZIN PROPANEDIOL 10 MG PO TABS
10.0000 mg | ORAL_TABLET | Freq: Every day | ORAL | Status: DC
  Administered 2021-08-28 – 2021-08-30 (×3): 10 mg via ORAL
  Filled 2021-08-28 (×3): qty 1

## 2021-08-28 MED ORDER — METOPROLOL TARTRATE 50 MG PO TABS
50.0000 mg | ORAL_TABLET | Freq: Two times a day (BID) | ORAL | Status: DC
  Administered 2021-08-28 – 2021-08-30 (×5): 50 mg via ORAL
  Filled 2021-08-28 (×5): qty 1

## 2021-08-28 MED ORDER — INSULIN DETEMIR 100 UNIT/ML ~~LOC~~ SOLN
10.0000 [IU] | Freq: Two times a day (BID) | SUBCUTANEOUS | Status: DC
  Administered 2021-08-28 – 2021-08-30 (×5): 10 [IU] via SUBCUTANEOUS
  Filled 2021-08-28 (×6): qty 0.1

## 2021-08-28 NOTE — Progress Notes (Addendum)
      [address]       [place] [address]             [phone]      6 Days Post-Op Procedure(s) (LRB): CORONARY ARTERY BYPASS GRAFTING (CABG), ON PUMP, TIMES FOUR , USING LEFT INTERNAL MAMMARY ARTERY AND ENDOSCOPICALLY HARVESTED RIGHT GREATER SAPHENOUS VEIN (N/A) TRANSESOPHAGEAL ECHOCARDIOGRAM (TEE) (N/A) APPLICATION OF CELL SAVER ENDOVEIN HARVEST OF GREATER SAPHENOUS VEIN  Subjective:  Patient sitting up in chair.  Overall he continues to feel better.  He denies abdominal symptoms.  He has moved his bowels.   Objective: Vital signs in last 24 hours: Temp:  [98 F (36.7 C)-98.5 F (36.9 C)] 98.3 F (36.8 C) (09/14 0743) Pulse Rate:  [75-132] 108 (09/14 0700) Cardiac Rhythm: Normal sinus rhythm (09/13 2000) Resp:  [17-28] 26 (09/14 0700) BP: (106-169)/(57-133) 169/133 (09/14 0700) SpO2:  [87 %-100 %] 100 % (09/14 0700) Weight:  [67.3 kg] 67.3 kg (09/14 0600)  Intake/Output from previous day: 09/13 0701 - 09/14 0700 In: 240 [P.O.:240] Out: 2950 [Urine:2950]  General appearance: alert, cooperative, and no distress Heart: regular rate and rhythm and tachy Lungs: clear to auscultation bilaterally Abdomen: Soft, + distention, hypoactive BS Extremities: trace edema Wound: clean and dry  Lab Results: Recent Labs    08/28/21 0040  WBC 6.3  HGB 10.2*  HCT 31.4*  PLT 331   BMET:  Recent Labs    08/27/21 0016 08/28/21 0040  NA 136 138  K 4.6 3.9  CL 102 100  CO2 24 28  GLUCOSE 130* 64*  BUN 22 21  CREATININE 1.28* 1.22  CALCIUM 8.7* 9.0    PT/INR: No results for input(s): LABPROT, INR in the last 72 hours. ABG    Component Value Date/Time   PHART 7.389 08/22/2021 1908   HCO3 21.3 08/22/2021 1908   TCO2 22 08/22/2021 1908   ACIDBASEDEF 3.0 (H) 08/22/2021 1908   O2SAT 89.0 08/22/2021 1908   CBG (last 3)  Recent Labs    08/27/21 1614 08/27/21 2203 08/28/21 0656  GLUCAP 166* 79 83    Assessment/Plan: S/P Procedure(s)  (LRB): CORONARY ARTERY BYPASS GRAFTING (CABG), ON PUMP, TIMES FOUR , USING LEFT INTERNAL MAMMARY ARTERY AND ENDOSCOPICALLY HARVESTED RIGHT GREATER SAPHENOUS VEIN (N/A) TRANSESOPHAGEAL ECHOCARDIOGRAM (TEE) (N/A) APPLICATION OF CELL SAVER ENDOVEIN HARVEST OF GREATER SAPHENOUS VEIN  CV- Sinus Tachycardia, + HTN- remains on Imdur 30 mg daily, will titrate Lopressor to 50 mg BID Pulm- off oxygen, continue IS Renal- creatinine is stable at 1.22, K is at 3.9, weight is trending down, continue Lasix, potassium Dysphagia- resolved, tolerating regular diet, continue ensure supplements as needed DM-hypoglycemic at times, levemir dose decreased to 10 mg BID, and Farxiga started Decondtioned- PT/OT recs home health will place orders Dispo- patient stable, titration of BB for better HR and BP control, will continue diuretics, awaiting bed on 4E  LOS: 12 days   Ellwood Handler, PA-C 08/28/2021  Agree with above Titrating BB and insulin Awaiting floor bed  Rakayla Ricklefs O Reannah Totten

## 2021-08-28 NOTE — Progress Notes (Signed)
Occupational Therapy Treatment Patient Details Name: Robert Osborne MRN: [id number] DOB: 05/30/1946 Today's Date: 08/28/2021   History of present illness Pt is a 75 year old admitted on 08/15/21 with chest pain, tachypneic and bradycardia. + NSTEMI. Underwent CABG on 08/23/21. PMH: HTN, HD, BPH, DM2.   OT comments  Focus of session on educating pt in energy conservation strategies and sternal precautions related to ADL, reinforced with written handouts. Pt completed grooming and eating with set up and UB dressing with min assist.Stood with RW and min guard assist with hands on knees.   Recommendations for follow up therapy are one component of a multi-disciplinary discharge planning process, led by the attending physician.  Recommendations may be updated based on patient status, additional functional criteria and insurance authorization.    Follow Up Recommendations  Home health OT;Supervision/Assistance - 24 hour    Equipment Recommendations  3 in 1 bedside commode    Recommendations for Other Services      Precautions / Restrictions Precautions Precautions: Sternal;Fall Precaution Comments: educated in sternal precautions related to IADL       Mobility Bed Mobility               General bed mobility comments: pt in chair    Transfers Overall transfer level: Needs assistance Equipment used: Rolling walker (2 wheeled) Transfers: Sit to/from Stand Sit to Stand: Min guard              Balance Overall balance assessment: Needs assistance   Sitting balance-Leahy Scale: Good     Standing balance support: Bilateral upper extremity supported Standing balance-Leahy Scale: Poor Standing balance comment: Reliant on UE support                           ADL either performed or assessed with clinical judgement   ADL Overall ADL's : Needs assistance/impaired Eating/Feeding: Minimal assistance;Sitting Eating/Feeding Details (indicate cue type and reason):  assisted to open containers Grooming: Wash/dry hands;Sitting;Set up           Upper Body Dressing : Minimal assistance;Sitting Upper Body Dressing Details (indicate cue type and reason): to doff front opening gown                         Vision       Perception     Praxis      Cognition Arousal/Alertness: Awake/alert Behavior During Therapy: WFL for tasks assessed/performed Overall Cognitive Status: Within Functional Limits for tasks assessed                                          Exercises     Shoulder Instructions       General Comments      Pertinent Vitals/ Pain       Pain Assessment: Faces Faces Pain Scale: Hurts little more Pain Location: chest, incision Pain Descriptors / Indicators: Sore Pain Intervention(s): Monitored during session;Repositioned  Home Living                                          Prior Functioning/Environment              Frequency  Min 2X/week  Progress Toward Goals  OT Goals(current goals can now be found in the care plan section)  Progress towards OT goals: Progressing toward goals  Acute Rehab OT Goals Patient Stated Goal: return home OT Goal Formulation: With patient Time For Goal Achievement: 09/09/21 Potential to Achieve Goals: Good  Plan Discharge plan remains appropriate    Co-evaluation                 AM-PAC OT "6 Clicks" Daily Activity     Outcome Measure   Help from another person eating meals?: A Little Help from another person taking care of personal grooming?: A Little Help from another person toileting, which includes using toliet, bedpan, or urinal?: A Little Help from another person bathing (including washing, rinsing, drying)?: A Little Help from another person to put on and taking off regular upper body clothing?: A Little Help from another person to put on and taking off regular lower body clothing?: A Little 6 Click Score:  18    End of Session Equipment Utilized During Treatment: Rolling walker  OT Visit Diagnosis: Unsteadiness on feet (R26.81);Other abnormalities of gait and mobility (R26.89);Muscle weakness (generalized) (M62.81)   Activity Tolerance Patient tolerated treatment well   Patient Left in chair;with call bell/phone within reach;with nursing/sitter in room   Nurse Communication          Time: 1045-1100 OT Time Calculation (min): 15 min  Charges: OT General Charges $OT Visit: 1 Visit OT Treatments $Self Care/Home Management : 8-22 mins  Nestor Lewandowsky, OTR/L Acute Rehabilitation Services Pager: [phone] Office: [phone]   Malka So 08/28/2021, 4:25 PM

## 2021-08-28 NOTE — Progress Notes (Signed)
Pt arrived to 4e from 2h. Pt oriented to room and staff. Vitals obtained. Telemetry box applied and CCMD notified. Incisions clean, dry, and intact.

## 2021-08-28 NOTE — Progress Notes (Signed)
Physical Therapy Treatment Patient Details Name: Robert Osborne MRN: [id number] DOB: [birth year] Today's Date: 08/28/2021   History of Present Illness Pt is a 75 year old admitted on 08/15/21 with chest pain, tachypneic and bradycardia. + NSTEMI. Underwent CABG on 08/23/21. PMH: HTN, HD, BPH, DM2.   PT Comments    Pt progressing with mobility. Today's session focused on transfer/gait training with RW as well as ambulation and therex for improved strength and endurance; pt requires intermittent verbal cues to maintain sternal precautions. Pt remains limited by generalized weakness, decreased activity tolerance, and impaired balance strategies/postural reactions. Will continue to follow acutely to address established goals.    Recommendations for follow up therapy are one component of a multi-disciplinary discharge planning process, led by the attending physician.  Recommendations may be updated based on patient status, additional functional criteria and insurance authorization.  Follow Up Recommendations  Home health PT;Supervision for mobility/OOB     Equipment Recommendations  Rolling walker with 5" wheels;3in1 (PT)    Recommendations for Other Services       Precautions / Restrictions Precautions Precautions: Sternal;Fall Restrictions Weight Bearing Restrictions: Yes (sternal precautions)     Mobility  Bed Mobility Overal bed mobility: Modified Independent Bed Mobility: Supine to Sit     Supine to sit: Modified independent (Device/Increase time);HOB elevated     General bed mobility comments: Able to power to sitting EOB from elevated HOB without use of UE support; will plan to practice from bed flat next session    Transfers Overall transfer level: Needs assistance Equipment used: Rolling walker (2 wheeled) Transfers: Sit to/from Stand Sit to Stand: Min guard         General transfer comment: Multiple sit<>stand from EOB and repeated stands from recliner to RW;  verbal cues for hand placement on knees and sequencing, pt reliant on momentum to power up with min guard; pt brace BLEs against edge of recliner in order to stand fully upright  Ambulation/Gait Ambulation/Gait assistance: Min guard Gait Distance (Feet): 390 Feet Assistive device: Rolling walker (2 wheeled) Gait Pattern/deviations: Step-through pattern;Decreased stride length;Wide base of support Gait velocity: Decreased   General Gait Details: Slow, mildly unsteady gait with RW and min guard for balance; 2x standing rest breaks in attempt to get reliable pulse ox reading; SpO2 down to 87% on RA when pleth reliable   Stairs             Wheelchair Mobility    Modified Rankin (Stroke Patients Only)       Balance Overall balance assessment: Needs assistance   Sitting balance-Leahy Scale: Good     Standing balance support: Bilateral upper extremity supported Standing balance-Leahy Scale: Poor Standing balance comment: Reliant on UE support                            Cognition Arousal/Alertness: Awake/alert Behavior During Therapy: WFL for tasks assessed/performed Overall Cognitive Status: Within Functional Limits for tasks assessed                                        Exercises Other Exercises Other Exercises: Repeated sit<>stands from recliner (reliant on UE support)    General Comments General comments (skin integrity, edema, etc.): HR up to 110s with ambulation. SpO2 92% on RA at rest, down to 87% on RA with ambulation (when pleth reliable; RN aware)  Pertinent Vitals/Pain Pain Assessment: Faces Faces Pain Scale: Hurts little more Pain Location: chest Pain Descriptors / Indicators: Sore Pain Intervention(s): Monitored during session    Home Living                      Prior Function            PT Goals (current goals can now be found in the care plan section) Progress towards PT goals: Progressing toward  goals    Frequency    Min 3X/week      PT Plan Current plan remains appropriate    Co-evaluation              AM-PAC PT "6 Clicks" Mobility   Outcome Measure  Help needed turning from your back to your side while in a flat bed without using bedrails?: None Help needed moving from lying on your back to sitting on the side of a flat bed without using bedrails?: A Little Help needed moving to and from a bed to a chair (including a wheelchair)?: A Little Help needed standing up from a chair using your arms (e.g., wheelchair or bedside chair)?: A Little Help needed to walk in hospital room?: A Little Help needed climbing 3-5 steps with a railing? : A Little 6 Click Score: 19    End of Session Equipment Utilized During Treatment: Gait belt Activity Tolerance: Patient tolerated treatment well Patient left: in chair;with call bell/phone within reach;with chair alarm set Nurse Communication: Mobility status PT Visit Diagnosis: Other abnormalities of gait and mobility (R26.89)     Time: 0934-1000 PT Time Calculation (min) (ACUTE ONLY): 26 min  Charges:  $Gait Training: 8-22 mins $Therapeutic Exercise: 8-22 mins                     Mabeline Caras, PT, DPT Acute Rehabilitation Services  Pager [phone] Office [place] 08/28/2021, 10:51 AM

## 2021-08-28 NOTE — TOC Benefit Eligibility Note (Signed)
Patient Teacher, English as a foreign language completed.    The patient is currently admitted and upon discharge could be taking Farxiga 10 mg.  The current 30 day co-pay is, $47.00.   The patient is currently admitted and upon discharge could be taking Jardiance 10 mg.  The current 30 day co-pay is, $47.00.   The patient is insured through [place] Patient Advocate Specialist [place] Team Direct Number: [phone]  Fax: [phone]

## 2021-08-28 NOTE — Progress Notes (Addendum)
[address]       [place] [address]             [phone]      6 Days Post-Op  Procedure(s) (LRB): CORONARY ARTERY BYPASS GRAFTING (CABG), ON PUMP, TIMES FOUR , USING LEFT INTERNAL MAMMARY ARTERY AND ENDOSCOPICALLY HARVESTED RIGHT GREATER SAPHENOUS VEIN (N/A) TRANSESOPHAGEAL ECHOCARDIOGRAM (TEE) (N/A) APPLICATION OF CELL SAVER ENDOVEIN HARVEST OF GREATER SAPHENOUS VEIN   Total Length of Stay:  LOS: 12 days    SUBJECTIVE:  Vitals:   08/28/21 1205 08/28/21 1300  BP: 140/81   Pulse:    Resp: (!) 34 (!) 26  Temp:    SpO2:      Intake/Output      09/13 0701 09/14 0700 09/14 0701 09/15 0700   P.O. 240    Total Intake(mL/kg) 240 (3.6)    Urine (mL/kg/hr) 2950 (1.8) 475 (0.8)   Stool 0    Total Output 2950 475   Net -2710 -475        Urine Occurrence 1 x    Stool Occurrence 1 x        sodium chloride     sodium chloride      CBC    Component Value Date/Time   WBC 6.3 08/28/2021 0040   RBC 3.63 (L) 08/28/2021 0040   HGB 10.2 (L) 08/28/2021 0040   HGB 15.1 08/29/2019 1236   HCT 31.4 (L) 08/28/2021 0040   HCT 44.5 08/29/2019 1236   PLT 331 08/28/2021 0040   PLT 222 08/29/2019 1236   MCV 86.5 08/28/2021 0040   MCV 87 08/29/2019 1236   MCH 28.1 08/28/2021 0040   MCHC 32.5 08/28/2021 0040   RDW 14.6 08/28/2021 0040   RDW 12.8 08/29/2019 1236   LYMPHSABS 0.6 (L) 08/15/2021 1614   LYMPHSABS 0.9 08/29/2019 1236   MONOABS 0.3 08/15/2021 1614   EOSABS 0.0 08/15/2021 1614   EOSABS 0.0 08/29/2019 1236   BASOSABS 0.0 08/15/2021 1614   BASOSABS 0.0 08/29/2019 1236   CMP     Component Value Date/Time   NA 138 08/28/2021 0040   NA 136 08/29/2019 1236   K 3.9 08/28/2021 0040   CL 100 08/28/2021 0040   CO2 28 08/28/2021 0040   GLUCOSE 64 (L) 08/28/2021 0040   BUN 21 08/28/2021 0040   BUN 18 08/29/2019 1236   CREATININE 1.22 08/28/2021 0040   CREATININE 1.28 05/17/2013 1513   CALCIUM 9.0 08/28/2021 0040   PROT 6.4 (L) 08/16/2021  0614   PROT 6.7 08/29/2019 1236   ALBUMIN 3.0 (L) 08/16/2021 0614   ALBUMIN 4.1 08/29/2019 1236   AST 64 (H) 08/16/2021 0614   ALT 20 08/16/2021 0614   ALKPHOS 83 08/16/2021 0614   BILITOT 0.5 08/16/2021 0614   BILITOT 1.0 08/29/2019 1236   GFRNONAA >60 08/28/2021 0040   GFRNONAA 57 (L) 05/17/2013 1513   GFRAA 82 08/29/2019 1236   GFRAA 66 05/17/2013 1513   ABG    Component Value Date/Time   PHART 7.389 08/22/2021 1908   PCO2ART 35.4 08/22/2021 1908   PO2ART 57 (L) 08/22/2021 1908   HCO3 21.3 08/22/2021 1908   TCO2 22 08/22/2021 1908   ACIDBASEDEF 3.0 (H) 08/22/2021 1908   O2SAT 89.0 08/22/2021 1908   CBG (last 3)  Recent Labs    08/27/21 2203 08/28/21 0656 08/28/21 1109  GLUCAP 79 83 171*     ASSESSMENT:  Stable day  Maintaining NSR, BP improved with titration  of Lopressor  Working well with PT/OT.. home orders placed  Transferring to 4E08     08/28/21 3:43 PM    Chart reviewed, agree with above.

## 2021-08-28 NOTE — Plan of Care (Signed)
  Problem: Cardiac: Goal: Will achieve and/or maintain hemodynamic stability Outcome: Progressing   Problem: Respiratory: Goal: Respiratory status will improve Outcome: Progressing   Problem: Urinary Elimination: Goal: Ability to achieve and maintain adequate renal perfusion and functioning will improve Outcome: Progressing   Problem: Activity: Goal: Risk for activity intolerance will decrease Outcome: Progressing   Problem: Pain Managment: Goal: General experience of comfort will improve Outcome: Progressing   Problem: Elimination: Goal: Will not experience complications related to bowel motility Outcome: Progressing   Problem: Urinary Elimination: Goal: Ability to achieve and maintain adequate renal perfusion and functioning will improve Outcome: Progressing   Problem: Activity: Goal: Risk for activity intolerance will decrease Outcome: Progressing

## 2021-08-29 LAB — GLUCOSE, CAPILLARY
Glucose-Capillary: 93 mg/dL (ref 70–99)
Glucose-Capillary: 162 mg/dL — ABNORMAL HIGH (ref 70–99)
Glucose-Capillary: 263 mg/dL — ABNORMAL HIGH (ref 70–99)
Glucose-Capillary: 146 mg/dL — ABNORMAL HIGH (ref 70–99)

## 2021-08-29 NOTE — Progress Notes (Addendum)
      [address]       [place] [address]             [phone]      7 Days Post-Op Procedure(s) (LRB): CORONARY ARTERY BYPASS GRAFTING (CABG), ON PUMP, TIMES FOUR , USING LEFT INTERNAL MAMMARY ARTERY AND ENDOSCOPICALLY HARVESTED RIGHT GREATER SAPHENOUS VEIN (N/A) TRANSESOPHAGEAL ECHOCARDIOGRAM (TEE) (N/A) APPLICATION OF CELL SAVER ENDOVEIN HARVEST OF GREATER SAPHENOUS VEIN  Subjective:  Patient has no complaints this morning.  He states he is just waking up.  Working with PT/OT  + BM  Objective: Vital signs in last 24 hours: Temp:  [98 F (36.7 C)-99.3 F (37.4 C)] 98.1 F (36.7 C) (09/15 0406) Pulse Rate:  [79-103] 79 (09/15 0406) Cardiac Rhythm: Normal sinus rhythm (09/14 1925) Resp:  [18-34] 20 (09/15 0406) BP: (122-141)/(69-87) 129/81 (09/15 0406) SpO2:  [99 %-100 %] 100 % (09/15 0406) Weight:  [64.5 kg] 64.5 kg (09/15 0406)  Intake/Output from previous day: 09/14 0701 - 09/15 0700 In: -  Out: 1450 [Urine:1450]  General appearance: alert, cooperative, and no distress Heart: regular rate and rhythm Lungs: clear to auscultation bilaterally Abdomen: soft, non-tender; bowel sounds normal; no masses,  no organomegaly Extremities: edema none present Wound: clean and dry  Lab Results: Recent Labs    08/28/21 0040  WBC 6.3  HGB 10.2*  HCT 31.4*  PLT 331   BMET:  Recent Labs    08/27/21 0016 08/28/21 0040  NA 136 138  K 4.6 3.9  CL 102 100  CO2 24 28  GLUCOSE 130* 64*  BUN 22 21  CREATININE 1.28* 1.22  CALCIUM 8.7* 9.0    PT/INR: No results for input(s): LABPROT, INR in the last 72 hours. ABG    Component Value Date/Time   PHART 7.389 08/22/2021 1908   HCO3 21.3 08/22/2021 1908   TCO2 22 08/22/2021 1908   ACIDBASEDEF 3.0 (H) 08/22/2021 1908   O2SAT 89.0 08/22/2021 1908   CBG (last 3)  Recent Labs    08/28/21 1552 08/28/21 2118 08/29/21 0621  GLUCAP 178* 138* 93    Assessment/Plan: S/P Procedure(s) (LRB): CORONARY  ARTERY BYPASS GRAFTING (CABG), ON PUMP, TIMES FOUR , USING LEFT INTERNAL MAMMARY ARTERY AND ENDOSCOPICALLY HARVESTED RIGHT GREATER SAPHENOUS VEIN (N/A) TRANSESOPHAGEAL ECHOCARDIOGRAM (TEE) (N/A) APPLICATION OF CELL SAVER ENDOVEIN HARVEST OF GREATER SAPHENOUS VEIN  CV- NSR, BP improved- continue Lopressor, Imdur Pulm- no acute issues, off oxygen, continue IS Renal- creatinine not checked today has been stable, weight is below admission, possibly d/c lasix and potassium will discuss with Dr. Zenda Alpers Dysphagia- resolved patient tolerating regular diet DM- sugars remain well controlled, improvement in hypoglycemia Deconditioning- home PT/OT arranged Dispo- patient is doing well, HTN improved with increase of BB, weight is below baseline, dysphagia resolved, plan for d/c in AM if okay with Dr. Kipp Brood   LOS: 13 days    Ellwood Handler, PA-C 08/29/2021  Overall doing well. Dispo planning. Home tomorrow.  Louana Fontenot Bary Leriche

## 2021-08-29 NOTE — Progress Notes (Signed)
Physical Therapy Treatment Patient Details Name: Robert Osborne MRN: [id number] DOB: 1946-02-28 Today's Date: 08/29/2021   History of Present Illness Pt is a 75 year old admitted on 08/15/21 with chest pain, tachypneic and bradycardia. + NSTEMI. Underwent CABG on 08/23/21. PMH: HTN, HD, BPH, DM2.    PT Comments    The pt was eager to participate in PT session this AM, continues to demo slow but steady progress with ambulation as well as stability for sit-stand transfers. The pt completed repeated sit-stand transfers from EOB with use of chest pillow, continues to benefit from cues for technique and repetition with minA due to persistent posterior bias. The pt was able to demo good stability with BUE support on RW, but was challenged by reduction in UE support and with trials of backwards walking at this time. Continue to recommend skilled PT acutely and following return home to further reduce risk of falls and fall-related injury following return home.     Recommendations for follow up therapy are one component of a multi-disciplinary discharge planning process, led by the attending physician.  Recommendations may be updated based on patient status, additional functional criteria and insurance authorization.  Follow Up Recommendations  Home health PT;Supervision for mobility/OOB     Equipment Recommendations  Rolling walker with 5" wheels;3in1 (PT)    Recommendations for Other Services       Precautions / Restrictions Precautions Precautions: Sternal;Fall Precaution Comments: educated in sternal precautions related to IADL Restrictions Weight Bearing Restrictions: Yes Other Position/Activity Restrictions: sternal precautions     Mobility  Bed Mobility Overal bed mobility: Modified Independent             General bed mobility comments: pt able to complete wihtout assist, increased time and effort    Transfers Overall transfer level: Needs assistance Equipment used:  None Transfers: Sit to/from Stand Sit to Stand: Min assist         General transfer comment: pt completed x8 from EOB with use of chest pillow and no UE support. bracking BLE on bed for support and still needed minA due to posterior bias. cues for technique of increased hip flexion and slow controlled lower to bed. pt able to improve to minG with reps andcues  Ambulation/Gait Ambulation/Gait assistance: Min guard;Min assist Gait Distance (Feet): 300 Feet Assistive device: Rolling walker (2 wheeled);None Gait Pattern/deviations: Step-through pattern;Decreased stride length;Wide base of support Gait velocity: Decreased Gait velocity interpretation: <1.31 ft/sec, indicative of household ambulator General Gait Details: pt with slow, mildly unsteady gait with use of RW, SpO2 and HR steady with gait. Pt challenged by x6 10 ft bouts of walking without UE support and demos significantly decreased stability and need for minA due toposterior bias.      Balance Overall balance assessment: Needs assistance   Sitting balance-Leahy Scale: Good     Standing balance support: Bilateral upper extremity supported;No upper extremity supported Standing balance-Leahy Scale: Poor Standing balance comment: pt with need for minA to steady with no UE support, minG with use of RW                            Cognition Arousal/Alertness: Awake/alert Behavior During Therapy: WFL for tasks assessed/performed Overall Cognitive Status: Within Functional Limits for tasks assessed                                 General  Comments: pt with slightly decreased awareness into deficits and postural positioning,decreased insight to deficits and need for increased assist for safety      Exercises Other Exercises Other Exercises: sit-stand from EOB, cues for hips back and trunk flexion, slow and controlled lower. completed x8 from with improvement from minA to miNG Other Exercises: 10 ft bout  withoutUE support, pt needing minA to steady, continues to have posterior LOB without UE support with static standing Other Exercises: 2 x 10 ft backwards walking. pt withnarrow BOS and step-to pattern, needing min-modA tocorrect posterior lean/bias    General Comments General comments (skin integrity, edema, etc.): VSS on RA, HR up to 119bpm      Pertinent Vitals/Pain Pain Assessment: Faces Faces Pain Scale: Hurts a little bit Pain Location: chest, incision Pain Descriptors / Indicators: Sore Pain Intervention(s): Limited activity within patient's tolerance;Monitored during session;Repositioned     PT Goals (current goals can now be found in the care plan section) Acute Rehab PT Goals Patient Stated Goal: return home PT Goal Formulation: With patient Time For Goal Achievement: 09/09/21 Potential to Achieve Goals: Good Progress towards PT goals: Progressing toward goals    Frequency    Min 3X/week      PT Plan Current plan remains appropriate       AM-PAC PT "6 Clicks" Mobility   Outcome Measure  Help needed turning from your back to your side while in a flat bed without using bedrails?: None Help needed moving from lying on your back to sitting on the side of a flat bed without using bedrails?: A Little Help needed moving to and from a bed to a chair (including a wheelchair)?: A Little Help needed standing up from a chair using your arms (e.g., wheelchair or bedside chair)?: A Little Help needed to walk in hospital room?: A Little Help needed climbing 3-5 steps with a railing? : A Little 6 Click Score: 19    End of Session Equipment Utilized During Treatment: Gait belt Activity Tolerance: Patient tolerated treatment well Patient left: in chair;with call bell/phone within reach;with chair alarm set Nurse Communication: Mobility status PT Visit Diagnosis: Other abnormalities of gait and mobility (R26.89)     Time: 0937-1000 PT Time Calculation (min) (ACUTE  ONLY): 23 min  Charges:  $Gait Training: 8-22 mins $Neuromuscular Re-education: 8-22 mins                     West Carbo, PT, DPT   Acute Rehabilitation Department Pager #: [phone]   Sandra Cockayne 08/29/2021, 10:28 AM

## 2021-08-29 NOTE — Progress Notes (Signed)
CARDIAC REHAB PHASE I   PRE:  Rate/Rhythm: 83 SR  BP:  Sitting: 120/67      SaO2: 92 RA  MODE:  Ambulation: 300 ft   POST:  Rate/Rhythm: 97 SR  BP:  Sitting: 148/66    SaO2: 91 RA   Pt agreeable to ambulate. Pt ambulated 329f in hallway assist of one with front wheel walker and gait belt. Pt denies CP, SOB, or dizziness. Pt returned to the recliner. Chair alarm on, bedside table within reach. Encouraged continued IS use and walks. Hopeful for d/c tomorrow.  1[place] RN BSN 08/29/2021 1:54 PM

## 2021-08-30 LAB — GLUCOSE, CAPILLARY: Glucose-Capillary: 90 mg/dL (ref 70–99)

## 2021-08-30 MED ORDER — ROSUVASTATIN CALCIUM 40 MG PO TABS: 40.0000 mg | ORAL_TABLET | Freq: Every day | ORAL | 3 refills | Status: DC

## 2021-08-30 MED ORDER — OXYCODONE HCL 5 MG PO TABS: 5.0000 mg | ORAL_TABLET | ORAL | 0 refills | Status: DC | PRN

## 2021-08-30 MED ORDER — FUROSEMIDE 40 MG PO TABS: 40.0000 mg | ORAL_TABLET | Freq: Every day | ORAL | 0 refills | Status: DC

## 2021-08-30 MED ORDER — CLOPIDOGREL BISULFATE 75 MG PO TABS: 75.0000 mg | ORAL_TABLET | Freq: Every day | ORAL | 3 refills | Status: AC

## 2021-08-30 MED ORDER — DAPAGLIFLOZIN PROPANEDIOL 10 MG PO TABS: 10.0000 mg | ORAL_TABLET | Freq: Every day | ORAL | 3 refills | Status: AC

## 2021-08-30 MED ORDER — METOPROLOL TARTRATE 50 MG PO TABS: 50.0000 mg | ORAL_TABLET | Freq: Two times a day (BID) | ORAL | 3 refills | Status: DC

## 2021-08-30 MED ORDER — ASPIRIN 81 MG PO TBEC: 81.0000 mg | DELAYED_RELEASE_TABLET | Freq: Every day | ORAL | 11 refills | Status: AC

## 2021-08-30 MED ORDER — ISOSORBIDE MONONITRATE ER 30 MG PO TB24: 30.0000 mg | ORAL_TABLET | Freq: Every day | ORAL | 3 refills | Status: DC

## 2021-08-30 NOTE — Plan of Care (Signed)
  Problem: Education: Goal: Knowledge of General Education information will improve Description: Including pain rating scale, medication(s)/side effects and non-pharmacologic comfort measures Outcome: Adequate for Discharge   

## 2021-08-30 NOTE — Progress Notes (Signed)
Physical Therapy Treatment Patient Details Name: Robert Osborne MRN: [id number] DOB: [birth year] Today's Date: 08/30/2021   History of Present Illness Pt is a 75 year old admitted on 08/15/21 with chest pain, tachypneic and bradycardia. + NSTEMI. Underwent CABG on 08/23/21. PMH: HTN, HD, BPH, DM2.   PT Comments    Pt progressing with mobility. Today's session focused on mobility for increasing activity tolerance and strength; pt mobilizing well with RW and intermittent min guard for balance. Pt preparing for d/c home today. Reviewed educ re: sternal precautions, fall risk reduction, incentive spirometer use, activity recommendations. Continue to recommend HHPT to maximize functional mobility and independence upon return home.    Recommendations for follow up therapy are one component of a multi-disciplinary discharge planning process, led by the attending physician.  Recommendations may be updated based on patient status, additional functional criteria and insurance authorization.  Follow Up Recommendations  Home health PT;Supervision for mobility/OOB     Equipment Recommendations  Rolling walker with 5" wheels;3in1 (PT) (delivered)    Recommendations for Other Services       Precautions / Restrictions Precautions Precautions: Sternal;Fall Precaution Comments: educated wife in sternal precautions related to mobility, ADL and IADL     Mobility  Bed Mobility Overal bed mobility: Modified Independent Bed Mobility: Supine to Sit           General bed mobility comments: in chair    Transfers Overall transfer level: Needs assistance Equipment used: Rolling walker (2 wheeled) Transfers: Sit to/from Stand Sit to Stand: Min guard         General transfer comment: Initial cues for hands on knees to stand instead of reaching for walker, good carryover to subsequent trials; pt reliant on bracing BLEs against chair to achieve fully upright, educ on importance of only doing this from  sturdy/heavy chair or else it could slide away resulting in fall  Ambulation/Gait Ambulation/Gait assistance: Min guard Gait Distance (Feet): 320 Feet Assistive device: Rolling walker (2 wheeled) Gait Pattern/deviations: Step-through pattern;Decreased stride length Gait velocity: Decreased   General Gait Details: Slow, mostly steady gait with RW and intermittent min guard for balance; pt endorses chest soreness and fatigue   Stairs             Wheelchair Mobility    Modified Rankin (Stroke Patients Only)       Balance Overall balance assessment: Needs assistance Sitting-balance support: No upper extremity supported;Feet supported Sitting balance-Leahy Scale: Good     Standing balance support: Bilateral upper extremity supported;No upper extremity supported Standing balance-Leahy Scale: Fair Standing balance comment: can static stand without UE support; static and dynamic stability improved with RW                            Cognition Arousal/Alertness: Awake/alert Behavior During Therapy: WFL for tasks assessed/performed Overall Cognitive Status: Within Functional Limits for tasks assessed                                        Exercises Other Exercises Other Exercises: Repeated sit<>stands from recliner, pt reliant on UE support Other Exercises: Provided new incentive spirometer - pt now able to pull ~1000-1250 mL with good technique    General Comments General comments (skin integrity, edema, etc.): Reviewed educ re: sternal precautions, fall risk reduction, DME use, incentive spirometer use at home, activity recommendations. Pt's  new RW and 3-in-1 adjusted for his height      Pertinent Vitals/Pain Pain Assessment: Faces Faces Pain Scale: Hurts a little bit Pain Location: sternal incision Pain Descriptors / Indicators: Sore Pain Intervention(s): Monitored during session    Home Living                      Prior  Function            PT Goals (current goals can now be found in the care plan section) Acute Rehab PT Goals Patient Stated Goal: return home Progress towards PT goals: Progressing toward goals    Frequency    Min 3X/week      PT Plan Current plan remains appropriate    Co-evaluation              AM-PAC PT "6 Clicks" Mobility   Outcome Measure  Help needed turning from your back to your side while in a flat bed without using bedrails?: None Help needed moving from lying on your back to sitting on the side of a flat bed without using bedrails?: None Help needed moving to and from a bed to a chair (including a wheelchair)?: A Little Help needed standing up from a chair using your arms (e.g., wheelchair or bedside chair)?: A Little Help needed to walk in hospital room?: A Little Help needed climbing 3-5 steps with a railing? : A Little 6 Click Score: 20    End of Session Equipment Utilized During Treatment: Gait belt Activity Tolerance: Patient tolerated treatment well Patient left: in chair;with call bell/phone within reach;with chair alarm set Nurse Communication: Mobility status PT Visit Diagnosis: Other abnormalities of gait and mobility (R26.89)     Time: [id number] PT Time Calculation (min) (ACUTE ONLY): 24 min  Charges:  $Therapeutic Exercise: 8-22 mins $Self Care/Home Management: 8-22                     Mabeline Caras, PT, DPT Acute Rehabilitation Services  Pager [phone] Office [place] 08/30/2021, 12:24 PM

## 2021-08-30 NOTE — Progress Notes (Signed)
Removed patient's suture and placed 4 steri strips. No drainage noted. Explained discharge instruction and answered all questions. Transported down with equipment for discharge.

## 2021-08-30 NOTE — TOC Transition Note (Signed)
Transition of Care (TOC) - CM/SW Discharge Note Marvetta Gibbons RN, BSN Transitions of Care Unit 4E- RN Case Manager See Treatment Team for direct phone #    Patient Details  Name: CASTON STRZELCZYK MRN: [id number] Date of Birth: 1945-12-23  Transition of Care [facility]) CM/SW Contact:  Dawayne Patricia, RN Phone Number: 08/30/2021, 11:36 AM   Clinical Narrative:    Pt stable for transition home today, HH and DME needs have been arranged- DME RW and 3n1 have been delivered to the room as per wife request.  Notice sent to [facility] with [place] of pt's discharge today for start of care- HHRN/PT/OT- [facility] will contact pt to schedule.   Wife to provide transportation home.  No further TOC needs noted.    Final next level of care: Home w Home Health Services Barriers to Discharge: Barriers Resolved   Patient Goals and CMS Choice Patient states their goals for this hospitalization and ongoing recovery are:: to return home   Choice offered to / list presented to : Spouse (Spouse did not have a preference.)  Discharge Placement               Home w/ [facility]        Discharge Plan and Services   Discharge Planning Services: CM Consult Post Acute Care Choice: Durable Medical Equipment, Home Health          DME Arranged: Walker rolling, Bedside commode (Please follow for orders.) DME Agency: [employer] Date DME Agency Contacted: 08/29/21 Time DME Agency Contacted: [phone] Representative spoke with at DME Agency: Freda Munro HH Arranged: PT, OT, Nurse's Aide [place] Agency: [place] Date [place]: 08/27/21 Time [place]: 1233 Representative spoke with at [place]: [place] ([place]) Interventions     Readmission Risk Interventions Readmission Risk Prevention Plan 08/27/2021  Transportation Screening Complete  PCP or Specialist Appt within 3-5 Days Complete  HRI or [place] Complete  Social Work Consult for  [place] Planning/Counseling [place] Screening Not Applicable  Medication Review Press photographer) Complete  Some recent data might be hidden

## 2021-08-30 NOTE — Progress Notes (Signed)
      [address]       [place] [address]             [phone]      8 Days Post-Op Procedure(s) (LRB): CORONARY ARTERY BYPASS GRAFTING (CABG), ON PUMP, TIMES FOUR , USING LEFT INTERNAL MAMMARY ARTERY AND ENDOSCOPICALLY HARVESTED RIGHT GREATER SAPHENOUS VEIN (N/A) TRANSESOPHAGEAL ECHOCARDIOGRAM (TEE) (N/A) APPLICATION OF CELL SAVER ENDOVEIN HARVEST OF GREATER SAPHENOUS VEIN  Subjective:  Patient tired this morning, states he didn't sleep well last night.  He also has some sternal discomfort.  He is due for pain medication.  Objective: Vital signs in last 24 hours: Temp:  [97.5 F (36.4 C)-98.3 F (36.8 C)] 97.5 F (36.4 C) (09/16 0324) Pulse Rate:  [82-96] 82 (09/16 0324) Cardiac Rhythm: Normal sinus rhythm (09/15 1913) Resp:  [19-20] 20 (09/16 0324) BP: (118-138)/(65-83) 134/83 (09/16 0324) SpO2:  [96 %-99 %] 97 % (09/16 0324) Weight:  [66.9 kg] 66.9 kg (09/16 0627)  Intake/Output from previous day: 09/15 0701 - 09/16 0700 In: 240 [P.O.:240] Out: 1285 [Urine:1285]   General appearance: alert, cooperative, and no distress Heart: regular rate and rhythm Lungs: clear to auscultation bilaterally Abdomen: soft, non-tender; bowel sounds normal; no masses,  no organomegaly Extremities: extremities normal, atraumatic, no cyanosis or edema Wound: clean and dry  Lab Results: Recent Labs    08/28/21 0040  WBC 6.3  HGB 10.2*  HCT 31.4*  PLT 331   BMET:  Recent Labs    08/28/21 0040  NA 138  K 3.9  CL 100  CO2 28  GLUCOSE 64*  BUN 21  CREATININE 1.22  CALCIUM 9.0    PT/INR: No results for input(s): LABPROT, INR in the last 72 hours. ABG    Component Value Date/Time   PHART 7.389 08/22/2021 1908   HCO3 21.3 08/22/2021 1908   TCO2 22 08/22/2021 1908   ACIDBASEDEF 3.0 (H) 08/22/2021 1908   O2SAT 89.0 08/22/2021 1908   CBG (last 3)  Recent Labs    08/29/21 1635 08/29/21 2117 08/30/21 0621  GLUCAP 263* 146* 90     Assessment/Plan: S/P Procedure(s) (LRB): CORONARY ARTERY BYPASS GRAFTING (CABG), ON PUMP, TIMES FOUR , USING LEFT INTERNAL MAMMARY ARTERY AND ENDOSCOPICALLY HARVESTED RIGHT GREATER SAPHENOUS VEIN (N/A) TRANSESOPHAGEAL ECHOCARDIOGRAM (TEE) (N/A) APPLICATION OF CELL SAVER ENDOVEIN HARVEST OF GREATER SAPHENOUS VEIN  CV- NSR with occasional PVC, HTN- continue Lopressor, Imdur Pulm- no acute issues, continue IS Renal stable, weight has been stable, will taper lasix DM- sugars controlled Dispo-patient stable, continue diuretics, home health PT/OT arranged, will d/c home today   LOS: 14 days   Ellwood Handler, PA-C 08/30/2021

## 2021-08-30 NOTE — Progress Notes (Signed)
Occupational Therapy Treatment Patient Details Name: Robert Osborne MRN: [id number] DOB: Apr 23, 1946 Today's Date: 08/30/2021   History of present illness Pt is a 75 year old admitted on 08/15/21 with chest pain, tachypneic and bradycardia. + NSTEMI. Underwent CABG on 08/23/21. PMH: HTN, HD, BPH, DM2.   OT comments  Wife in room, pt preparing for d/c. Educated wife in sternal precautions related to ADL, IADL and with sit to stand. Instructed in option of hands on knees to assist pt in translating weight forward. Educated in multiple uses of 3 in 1. Wife unaware pt is able to dress himself, encouraged her to allow pt to participate as tolerated.    Recommendations for follow up therapy are one component of a multi-disciplinary discharge planning process, led by the attending physician.  Recommendations may be updated based on patient status, additional functional criteria and insurance authorization.    Follow Up Recommendations  Home health OT;Supervision/Assistance - 24 hour    Equipment Recommendations  3 in 1 bedside commode    Recommendations for Other Services      Precautions / Restrictions Precautions Precautions: Sternal;Fall Precaution Comments: educated wife in sternal precautions related to mobility, ADL and IADL       Mobility Bed Mobility               General bed mobility comments: in chair    Transfers Overall transfer level: Needs assistance Equipment used: Rolling walker (2 wheeled) Transfers: Sit to/from Stand Sit to Stand: Min guard         General transfer comment: hands on knees technique appeared to assist pt in translating weight forward over feet vs holding pillow    Balance Overall balance assessment: Needs assistance   Sitting balance-Leahy Scale: Good       Standing balance-Leahy Scale: Fair Standing balance comment: statically at sink                           ADL either performed or assessed with clinical judgement    ADL Overall ADL's : Needs assistance/impaired     Grooming: Supervision/safety;Standing;Wash/dry hands                   Toilet Transfer: Supervision/safety;Ambulation;RW   Toileting- Clothing Manipulation and Hygiene: Supervision/safety;Sit to/from stand       Functional mobility during ADLs: Supervision/safety;Rolling walker General ADL Comments: Educated wife in use of 3 in 1 over toilet as a riser and as a shower seat for safety and energy conservation. Educated in general energy conservation strategies. Encouraged wife to allow pt to participate in ADL within tolerance and sternal precautions, unaware pt can don his own socks.     Vision       Perception     Praxis      Cognition Arousal/Alertness: Awake/alert Behavior During Therapy: WFL for tasks assessed/performed Overall Cognitive Status: Within Functional Limits for tasks assessed                                          Exercises     Shoulder Instructions       General Comments      Pertinent Vitals/ Pain       Pain Assessment: Faces Faces Pain Scale: Hurts a little bit Pain Location: chest, incision Pain Descriptors / Indicators: Sore Pain Intervention(s): Monitored during session  Home  Living                                          Prior Functioning/Environment              Frequency  Min 2X/week        Progress Toward Goals  OT Goals(current goals can now be found in the care plan section)  Progress towards OT goals: Progressing toward goals  Acute Rehab OT Goals Patient Stated Goal: return home OT Goal Formulation: With patient Time For Goal Achievement: 09/09/21 Potential to Achieve Goals: Good  Plan Discharge plan remains appropriate    Co-evaluation                 AM-PAC OT "6 Clicks" Daily Activity     Outcome Measure   Help from another person eating meals?: A Little Help from another person taking care of  personal grooming?: A Little Help from another person toileting, which includes using toliet, bedpan, or urinal?: A Little Help from another person bathing (including washing, rinsing, drying)?: A Little Help from another person to put on and taking off regular upper body clothing?: A Little Help from another person to put on and taking off regular lower body clothing?: A Little 6 Click Score: 18    End of Session Equipment Utilized During Treatment: Rolling walker  OT Visit Diagnosis: Unsteadiness on feet (R26.81);Other abnormalities of gait and mobility (R26.89);Muscle weakness (generalized) (M62.81)   Activity Tolerance Patient tolerated treatment well   Patient Left in chair;with call bell/phone within reach;with nursing/sitter in room;with family/visitor present   Nurse Communication          Time: 1140-1155 OT Time Calculation (min): 15 min  Charges: OT General Charges $OT Visit: 1 Visit OT Treatments $Self Care/Home Management : 8-22 mins  Nestor Lewandowsky, OTR/L Acute Rehabilitation Services Pager: [phone] Office: [phone]   Malka So 08/30/2021, 12:04 PM

## 2021-08-30 NOTE — Care Management Important Message (Signed)
Important Message  Patient Details  Name: Robert Osborne MRN: [id number] Date of Birth: 1946-09-02   Medicare Important Message Given:  Yes     [name] 08/30/2021, 2:56 PM

## 2021-08-30 NOTE — Progress Notes (Signed)
CARDIAC REHAB PHASE I   D/c education completed with pt and wife. Pt educated on importance of site care and monitoring incisions daily. Encouraged continued IS use, walks, and sternal precautions. Given in-the-tube sheet along with heart healthy and diabetic diets. Reviewed restrictions and exercise guidelines. Will refer to [place] Rufina Falco, RN BSN 08/30/2021 11:22 AM

## 2021-09-02 DIAGNOSIS — Z7984 Long term (current) use of oral hypoglycemic drugs: Secondary | ICD-10-CM | POA: Diagnosis not present

## 2021-09-02 DIAGNOSIS — N179 Acute kidney failure, unspecified: Secondary | ICD-10-CM | POA: Diagnosis not present

## 2021-09-02 DIAGNOSIS — N390 Urinary tract infection, site not specified: Secondary | ICD-10-CM | POA: Diagnosis not present

## 2021-09-02 DIAGNOSIS — R911 Solitary pulmonary nodule: Secondary | ICD-10-CM | POA: Diagnosis not present

## 2021-09-02 DIAGNOSIS — E1159 Type 2 diabetes mellitus with other circulatory complications: Secondary | ICD-10-CM | POA: Diagnosis not present

## 2021-09-02 DIAGNOSIS — I2109 ST elevation (STEMI) myocardial infarction involving other coronary artery of anterior wall: Secondary | ICD-10-CM | POA: Diagnosis not present

## 2021-09-02 DIAGNOSIS — I214 Non-ST elevation (NSTEMI) myocardial infarction: Secondary | ICD-10-CM | POA: Diagnosis not present

## 2021-09-02 DIAGNOSIS — E785 Hyperlipidemia, unspecified: Secondary | ICD-10-CM | POA: Diagnosis not present

## 2021-09-02 DIAGNOSIS — E559 Vitamin D deficiency, unspecified: Secondary | ICD-10-CM | POA: Diagnosis not present

## 2021-09-02 DIAGNOSIS — E1169 Type 2 diabetes mellitus with other specified complication: Secondary | ICD-10-CM | POA: Diagnosis not present

## 2021-09-02 DIAGNOSIS — E1165 Type 2 diabetes mellitus with hyperglycemia: Secondary | ICD-10-CM | POA: Diagnosis not present

## 2021-09-02 DIAGNOSIS — N400 Enlarged prostate without lower urinary tract symptoms: Secondary | ICD-10-CM | POA: Diagnosis not present

## 2021-09-02 DIAGNOSIS — Z951 Presence of aortocoronary bypass graft: Secondary | ICD-10-CM | POA: Diagnosis not present

## 2021-09-02 DIAGNOSIS — J188 Other pneumonia, unspecified organism: Secondary | ICD-10-CM | POA: Diagnosis not present

## 2021-09-02 DIAGNOSIS — Z7902 Long term (current) use of antithrombotics/antiplatelets: Secondary | ICD-10-CM | POA: Diagnosis not present

## 2021-09-02 DIAGNOSIS — Z8551 Personal history of malignant neoplasm of bladder: Secondary | ICD-10-CM | POA: Diagnosis not present

## 2021-09-02 DIAGNOSIS — Z7982 Long term (current) use of aspirin: Secondary | ICD-10-CM | POA: Diagnosis not present

## 2021-09-02 DIAGNOSIS — I152 Hypertension secondary to endocrine disorders: Secondary | ICD-10-CM | POA: Diagnosis not present

## 2021-09-02 DIAGNOSIS — E441 Mild protein-calorie malnutrition: Secondary | ICD-10-CM | POA: Diagnosis not present

## 2021-09-02 DIAGNOSIS — Z794 Long term (current) use of insulin: Secondary | ICD-10-CM | POA: Diagnosis not present

## 2021-09-02 DIAGNOSIS — Z48812 Encounter for surgical aftercare following surgery on the circulatory system: Secondary | ICD-10-CM | POA: Diagnosis not present

## 2021-09-03 ENCOUNTER — Telehealth (HOSPITAL_COMMUNITY): Payer: Self-pay

## 2021-09-03 NOTE — Telephone Encounter (Signed)
Per phase I cardiac rehab, fax cardiac rehab referral to [facility] cardiac rehab.

## 2021-09-05 ENCOUNTER — Ambulatory Visit (INDEPENDENT_AMBULATORY_CARE_PROVIDER_SITE_OTHER): Payer: Self-pay | Admitting: Thoracic Surgery (Cardiothoracic Vascular Surgery)

## 2021-09-05 ENCOUNTER — Other Ambulatory Visit: Payer: Self-pay

## 2021-09-05 DIAGNOSIS — E441 Mild protein-calorie malnutrition: Secondary | ICD-10-CM | POA: Diagnosis not present

## 2021-09-05 DIAGNOSIS — Z951 Presence of aortocoronary bypass graft: Secondary | ICD-10-CM | POA: Diagnosis not present

## 2021-09-05 DIAGNOSIS — Z794 Long term (current) use of insulin: Secondary | ICD-10-CM | POA: Diagnosis not present

## 2021-09-05 DIAGNOSIS — E1165 Type 2 diabetes mellitus with hyperglycemia: Secondary | ICD-10-CM | POA: Diagnosis not present

## 2021-09-05 DIAGNOSIS — E559 Vitamin D deficiency, unspecified: Secondary | ICD-10-CM | POA: Diagnosis not present

## 2021-09-05 DIAGNOSIS — Z7984 Long term (current) use of oral hypoglycemic drugs: Secondary | ICD-10-CM | POA: Diagnosis not present

## 2021-09-05 DIAGNOSIS — Z7902 Long term (current) use of antithrombotics/antiplatelets: Secondary | ICD-10-CM | POA: Diagnosis not present

## 2021-09-05 DIAGNOSIS — Z48812 Encounter for surgical aftercare following surgery on the circulatory system: Secondary | ICD-10-CM | POA: Diagnosis not present

## 2021-09-05 DIAGNOSIS — E1159 Type 2 diabetes mellitus with other circulatory complications: Secondary | ICD-10-CM | POA: Diagnosis not present

## 2021-09-05 DIAGNOSIS — Z8551 Personal history of malignant neoplasm of bladder: Secondary | ICD-10-CM | POA: Diagnosis not present

## 2021-09-05 DIAGNOSIS — N400 Enlarged prostate without lower urinary tract symptoms: Secondary | ICD-10-CM | POA: Diagnosis not present

## 2021-09-05 DIAGNOSIS — E1169 Type 2 diabetes mellitus with other specified complication: Secondary | ICD-10-CM | POA: Diagnosis not present

## 2021-09-05 DIAGNOSIS — I2109 ST elevation (STEMI) myocardial infarction involving other coronary artery of anterior wall: Secondary | ICD-10-CM | POA: Diagnosis not present

## 2021-09-05 DIAGNOSIS — N179 Acute kidney failure, unspecified: Secondary | ICD-10-CM | POA: Diagnosis not present

## 2021-09-05 DIAGNOSIS — R911 Solitary pulmonary nodule: Secondary | ICD-10-CM | POA: Diagnosis not present

## 2021-09-05 DIAGNOSIS — E785 Hyperlipidemia, unspecified: Secondary | ICD-10-CM | POA: Diagnosis not present

## 2021-09-05 DIAGNOSIS — I152 Hypertension secondary to endocrine disorders: Secondary | ICD-10-CM | POA: Diagnosis not present

## 2021-09-05 DIAGNOSIS — Z7982 Long term (current) use of aspirin: Secondary | ICD-10-CM | POA: Diagnosis not present

## 2021-09-05 NOTE — Progress Notes (Signed)
     [address]       [place] [address]             [phone]       Patient: Home Provider: Office Consent for Telemedicine visit obtained.  Today's visit was completed via a real-time telehealth (see specific modality noted below). The patient/authorized person provided oral consent at the time of the visit to engage in a telemedicine encounter with the present provider at [facility]. The patient/authorized person was informed of the potential benefits, limitations, and risks of telemedicine. The patient/authorized person expressed understanding that the laws that protect confidentiality also apply to telemedicine. The patient/authorized person acknowledged understanding that telemedicine does not provide emergency services and that he or she would need to call 911 or proceed to the nearest hospital for help if such a need arose.   Total time spent in the clinical discussion 10 minutes.  Telehealth Modality: Phone visit (audio only)  I had a telephone visit with Mr. Eland.  He is status post CABG.  Since being home he has done well.  He has no complaints.  He is using his pain medication intermittently.  He is ambulating without any difficulty.  He has not been checking his vital signs at home.  He is scheduled to follow-up with cardiology next week.  I will see him back in clinic in 1 month with a chest x-ray.

## 2021-09-06 ENCOUNTER — Other Ambulatory Visit: Payer: Self-pay | Admitting: Thoracic Surgery (Cardiothoracic Vascular Surgery)

## 2021-09-06 ENCOUNTER — Telehealth: Payer: Medicare HMO | Admitting: Thoracic Surgery (Cardiothoracic Vascular Surgery)

## 2021-09-06 DIAGNOSIS — I152 Hypertension secondary to endocrine disorders: Secondary | ICD-10-CM | POA: Diagnosis not present

## 2021-09-06 DIAGNOSIS — Z951 Presence of aortocoronary bypass graft: Secondary | ICD-10-CM | POA: Diagnosis not present

## 2021-09-06 DIAGNOSIS — E1165 Type 2 diabetes mellitus with hyperglycemia: Secondary | ICD-10-CM | POA: Diagnosis not present

## 2021-09-06 DIAGNOSIS — Z7982 Long term (current) use of aspirin: Secondary | ICD-10-CM | POA: Diagnosis not present

## 2021-09-06 DIAGNOSIS — R911 Solitary pulmonary nodule: Secondary | ICD-10-CM | POA: Diagnosis not present

## 2021-09-06 DIAGNOSIS — Z7984 Long term (current) use of oral hypoglycemic drugs: Secondary | ICD-10-CM | POA: Diagnosis not present

## 2021-09-06 DIAGNOSIS — Z794 Long term (current) use of insulin: Secondary | ICD-10-CM | POA: Diagnosis not present

## 2021-09-06 DIAGNOSIS — E785 Hyperlipidemia, unspecified: Secondary | ICD-10-CM | POA: Diagnosis not present

## 2021-09-06 DIAGNOSIS — E559 Vitamin D deficiency, unspecified: Secondary | ICD-10-CM | POA: Diagnosis not present

## 2021-09-06 DIAGNOSIS — N179 Acute kidney failure, unspecified: Secondary | ICD-10-CM | POA: Diagnosis not present

## 2021-09-06 DIAGNOSIS — E441 Mild protein-calorie malnutrition: Secondary | ICD-10-CM | POA: Diagnosis not present

## 2021-09-06 DIAGNOSIS — E1159 Type 2 diabetes mellitus with other circulatory complications: Secondary | ICD-10-CM | POA: Diagnosis not present

## 2021-09-06 DIAGNOSIS — N400 Enlarged prostate without lower urinary tract symptoms: Secondary | ICD-10-CM | POA: Diagnosis not present

## 2021-09-06 DIAGNOSIS — Z7902 Long term (current) use of antithrombotics/antiplatelets: Secondary | ICD-10-CM | POA: Diagnosis not present

## 2021-09-06 DIAGNOSIS — Z8551 Personal history of malignant neoplasm of bladder: Secondary | ICD-10-CM | POA: Diagnosis not present

## 2021-09-06 DIAGNOSIS — E1169 Type 2 diabetes mellitus with other specified complication: Secondary | ICD-10-CM | POA: Diagnosis not present

## 2021-09-06 DIAGNOSIS — Z48812 Encounter for surgical aftercare following surgery on the circulatory system: Secondary | ICD-10-CM | POA: Diagnosis not present

## 2021-09-06 DIAGNOSIS — I2109 ST elevation (STEMI) myocardial infarction involving other coronary artery of anterior wall: Secondary | ICD-10-CM | POA: Diagnosis not present

## 2021-09-06 MED ORDER — OXYCODONE HCL 5 MG PO TABS: 5.0000 mg | ORAL_TABLET | ORAL | 0 refills | Status: DC | PRN

## 2021-09-07 ENCOUNTER — Other Ambulatory Visit: Payer: Self-pay | Admitting: Physician Assistant

## 2021-09-09 ENCOUNTER — Other Ambulatory Visit: Payer: Self-pay | Admitting: Physician Assistant

## 2021-09-09 DIAGNOSIS — Z7984 Long term (current) use of oral hypoglycemic drugs: Secondary | ICD-10-CM | POA: Diagnosis not present

## 2021-09-09 DIAGNOSIS — I152 Hypertension secondary to endocrine disorders: Secondary | ICD-10-CM | POA: Diagnosis not present

## 2021-09-09 DIAGNOSIS — Z951 Presence of aortocoronary bypass graft: Secondary | ICD-10-CM | POA: Diagnosis not present

## 2021-09-09 DIAGNOSIS — E785 Hyperlipidemia, unspecified: Secondary | ICD-10-CM | POA: Diagnosis not present

## 2021-09-09 DIAGNOSIS — E559 Vitamin D deficiency, unspecified: Secondary | ICD-10-CM | POA: Diagnosis not present

## 2021-09-09 DIAGNOSIS — E1165 Type 2 diabetes mellitus with hyperglycemia: Secondary | ICD-10-CM | POA: Diagnosis not present

## 2021-09-09 DIAGNOSIS — Z794 Long term (current) use of insulin: Secondary | ICD-10-CM | POA: Diagnosis not present

## 2021-09-09 DIAGNOSIS — C679 Malignant neoplasm of bladder, unspecified: Secondary | ICD-10-CM | POA: Diagnosis not present

## 2021-09-09 DIAGNOSIS — R911 Solitary pulmonary nodule: Secondary | ICD-10-CM | POA: Diagnosis not present

## 2021-09-09 DIAGNOSIS — N400 Enlarged prostate without lower urinary tract symptoms: Secondary | ICD-10-CM | POA: Diagnosis not present

## 2021-09-09 DIAGNOSIS — Z48812 Encounter for surgical aftercare following surgery on the circulatory system: Secondary | ICD-10-CM | POA: Diagnosis not present

## 2021-09-09 DIAGNOSIS — Z8551 Personal history of malignant neoplasm of bladder: Secondary | ICD-10-CM | POA: Diagnosis not present

## 2021-09-09 DIAGNOSIS — E441 Mild protein-calorie malnutrition: Secondary | ICD-10-CM | POA: Diagnosis not present

## 2021-09-09 DIAGNOSIS — E1169 Type 2 diabetes mellitus with other specified complication: Secondary | ICD-10-CM | POA: Diagnosis not present

## 2021-09-09 DIAGNOSIS — E1159 Type 2 diabetes mellitus with other circulatory complications: Secondary | ICD-10-CM | POA: Diagnosis not present

## 2021-09-09 DIAGNOSIS — Z7902 Long term (current) use of antithrombotics/antiplatelets: Secondary | ICD-10-CM | POA: Diagnosis not present

## 2021-09-09 DIAGNOSIS — I2109 ST elevation (STEMI) myocardial infarction involving other coronary artery of anterior wall: Secondary | ICD-10-CM | POA: Diagnosis not present

## 2021-09-09 DIAGNOSIS — N179 Acute kidney failure, unspecified: Secondary | ICD-10-CM | POA: Diagnosis not present

## 2021-09-09 DIAGNOSIS — Z7982 Long term (current) use of aspirin: Secondary | ICD-10-CM | POA: Diagnosis not present

## 2021-09-10 ENCOUNTER — Other Ambulatory Visit: Payer: Self-pay

## 2021-09-10 ENCOUNTER — Encounter: Payer: Self-pay | Admitting: Physician Assistant

## 2021-09-10 ENCOUNTER — Ambulatory Visit: Payer: Medicare HMO | Admitting: Physician Assistant

## 2021-09-10 VITALS — BP 121/82 | HR 77 | Ht 67.0 in | Wt 143.8 lb

## 2021-09-10 DIAGNOSIS — N179 Acute kidney failure, unspecified: Secondary | ICD-10-CM | POA: Diagnosis not present

## 2021-09-10 DIAGNOSIS — D649 Anemia, unspecified: Secondary | ICD-10-CM | POA: Diagnosis not present

## 2021-09-10 DIAGNOSIS — I10 Essential (primary) hypertension: Secondary | ICD-10-CM | POA: Diagnosis not present

## 2021-09-10 DIAGNOSIS — I25709 Atherosclerosis of coronary artery bypass graft(s), unspecified, with unspecified angina pectoris: Secondary | ICD-10-CM | POA: Diagnosis not present

## 2021-09-10 DIAGNOSIS — E119 Type 2 diabetes mellitus without complications: Secondary | ICD-10-CM | POA: Diagnosis not present

## 2021-09-10 DIAGNOSIS — E785 Hyperlipidemia, unspecified: Secondary | ICD-10-CM

## 2021-09-10 MED ORDER — ISOSORBIDE MONONITRATE ER 60 MG PO TB24: 60.0000 mg | ORAL_TABLET | Freq: Every day | ORAL | 1 refills | Status: DC

## 2021-09-10 NOTE — Progress Notes (Signed)
Cardiology Office Note:    Date:  09/12/2021   ID:  Robert Osborne, DOB 07/10/1946, MRN [id number]  PCP:  System, Provider Not In   [place] Providers Cardiologist:  Minus Breeding, MD     Referring MD: No ref. provider found   Chief Complaint  Patient presents with   Follow-up    Seen for Dr. Percival Spanish    History of Present Illness:    Robert Osborne is a 75 y.o. male with a hx of HTN, HLD, DM II and recently diagnosed CAD.  Patient had a low risk nuclear stress test at [facility] in May 2022.  He recently went to [facility] on 08/15/2021 for evaluation of chest pain.  Chest x-ray showing COPD with right apical scarring but no acute finding.  CTA negative for PE, but noted a 5 mm right upper lobe lung nodule with repeat noncontrast CT recommended in 3 to 6 months.  Echocardiogram obtained on 08/16/2021 showed EF 55%, moderate asymmetric LVH of basal anteroseptal segment, grade 1 DD, new wall motion abnormality suggestive of mid to distal LAD disease versus possible stress-induced cardiomyopathy, trivial MR. Cardiac catheterization performed on 08/16/2021 showed EF EF 45 to 50%, 75% ostial left circumflex, 80% OM 2, 90% OM1, 25% ostial LAD, 90% D1, 90% mid LAD, 100% distal LAD occlusion, 90% RPAV, 25% mid RCA.  Culprit lesion was felt to be occlusion in the mid LAD territory, however he has heavily calcified lesion in the other vessels as well.  The lesion was successfully wired and flow restored, however a balloon could not be advanced through the heavily calcified disease more proximally.  CT surgery was consulted for consideration of CABG.  Patient ultimately underwent CABG x4 with LIMA-diagonal, reverse SVG-LAD, reverse SVG-PDA and reverse SVG-OM by Dr. Kipp Brood on 08/22/2021.  He also required endarterectomy in the operating room of his LAD.  Postprocedure, he was started on aspirin and Plavix.  Unfortunately, after bypass surgery, cardiology service got called again on 08/23/2021 for  midsternal chest pain.  Repeat EKG showed stable ST elevation in V2-V4.  Repeat echocardiogram obtained on 08/23/2021 showed EF 50 to 55%, apical akinesis, moderately reduced RV EF, free wall hypokinesis.  Echocardiogram was reviewed by Dr. Martinique who felt wall motion abnormality is unchanged when compared to the previous echo on 9/2, in reviewing the cardiac cath film, LAD was diffusely diseased and calcified, therefore he did not believe there was anything that we could offer percutaneously even if the vessel/graft went down, medical management was recommended.  Patient presents today for follow-up.  He continues to have intermittent chest pain.  This is similar to what brought him to the hospital to begin with.  I will increase Imdur to 60 mg daily.  He has completed the course of Lasix.  He may also come off of potassium supplement as well.  I will obtain CBC and a basic metabolic panel today.  He remains fairly weak, however wife mentions his exercise ability was limited even prior to the surgery.  I recommend he gradually increase activity level.  EKG demonstrated deep anterior T wave inversion.  As mentioned previously, Dr. Martinique evaluated the patient on 08/23/2021 who felt some of his chest pain is related to diffusely diseased LAD and percutaneous treatment is limited even if the graft goes down.  Therefore he recommended medical management.  He will need to return in 2 to 3 weeks on day Dr. Percival Spanish is in the office for reassessment of the  chest pain.  At which time he is due for fasting lipid panel and LFT.  I recommend a 64-month follow-up with Dr. Percival Spanish.   Past Medical History:  Diagnosis Date   Bladder cancer ([place]) 08/18/2013   Cancer ([place])    papillary urethral carcinoma,low grade, non-invasive   Diabetes mellitus without complication ([place])    Erectile dysfunction 10/24/2016   Hyperlipidemia associated with type 2 diabetes mellitus ([place]) 05/17/2013   Hypertension associated with diabetes ([place])     Hypokalemia 11/27/2017   Hypomagnesemia 11/27/2017   Major frontotemporal neurocognitive disorder, probable, with behavioral disturbance 11/27/2017   Vitamin D deficiency 05/17/2013    Past Surgical History:  Procedure Laterality Date   COLONOSCOPY W/ POLYPECTOMY     CORONARY ARTERY BYPASS GRAFT N/A 08/22/2021   Procedure: CORONARY ARTERY BYPASS GRAFTING (CABG), ON PUMP, TIMES FOUR , USING LEFT INTERNAL MAMMARY ARTERY AND ENDOSCOPICALLY HARVESTED RIGHT GREATER SAPHENOUS VEIN;  Surgeon: Lajuana Matte, MD;  Location: [place];  Service: Open Heart Surgery;  Laterality: N/A;   CORONARY BALLOON ANGIOPLASTY N/A 08/16/2021   Procedure: CORONARY BALLOON ANGIOPLASTY;  Surgeon: Jettie Booze, MD;  Location: [place] [facility];  Service: Cardiovascular;  Laterality: N/A;   ENDOVEIN HARVEST OF GREATER SAPHENOUS VEIN  08/22/2021   Procedure: ENDOVEIN HARVEST OF GREATER SAPHENOUS VEIN;  Surgeon: Lajuana Matte, MD;  Location: [place];  Service: Open Heart Surgery;;   LEFT HEART CATH AND CORONARY ANGIOGRAPHY N/A 08/16/2021   Procedure: LEFT HEART CATH AND CORONARY ANGIOGRAPHY;  Surgeon: Jettie Booze, MD;  Location: [place] [facility];  Service: Cardiovascular;  Laterality: N/A;   TEE WITHOUT CARDIOVERSION N/A 08/22/2021   Procedure: TRANSESOPHAGEAL ECHOCARDIOGRAM (TEE);  Surgeon: Lajuana Matte, MD;  Location: [facility];  Service: Open Heart Surgery;  Laterality: N/A;    Current Medications: Current Meds  Medication Sig   aspirin EC 81 MG EC tablet Take 1 tablet (81 mg total) by mouth daily. Swallow whole.   blood glucose meter kit and supplies KIT Dispense based on patient and insurance preference. Use up to twice a daily as directed. (FOR ICD-10 - type 2 DM uncontrolled E11.9)   blood glucose meter kit and supplies Dispense based on patient and insurance preference. Use up to four times daily as directed. (FOR ICD-10 E10.9, E11.9).   blood glucose meter kit and supplies Use up to four  times daily as directed. dx E11.9   Blood Glucose Monitoring Suppl (ONE TOUCH ULTRA 2) w/Device KIT USE TO CHECK BLOOD SUGAR UP TO 4 TIMES DAILY AS DIRECTED   clopidogrel (PLAVIX) 75 MG tablet Take 1 tablet (75 mg total) by mouth daily.   dapagliflozin propanediol (FARXIGA) 10 MG TABS tablet Take 1 tablet (10 mg total) by mouth daily.   glucose blood test strip Use as instructed   insulin aspart (NOVOLOG) 100 UNIT/ML FlexPen Inject 9 Units into the skin 2 (two) times daily with a meal.   insulin degludec (TRESIBA FLEXTOUCH) 100 UNIT/ML SOPN FlexTouch Pen Inject 0.05 mLs (5 Units total) into the skin daily. (Patient taking differently: Inject 18 Units into the skin daily.)   Insulin Pen Needle (PEN NEEDLES) 32G X 4 MM MISC 100 each by Does not apply route 4 (four) times daily. E10.9   isosorbide mononitrate (IMDUR) 30 MG 24 hr tablet Take 1 tablet (30 mg total) by mouth daily.   isosorbide mononitrate (IMDUR) 60 MG 24 hr tablet Take 1 tablet (60 mg total) by mouth daily.   Lancets (  ONETOUCH DELICA PLUS [id number]) MISC USE TO CHECK BLOOD SUGAR UP TO 4 TIMES A DAY AS DIRECTED   metoprolol tartrate (LOPRESSOR) 50 MG tablet Take 1 tablet (50 mg total) by mouth 2 (two) times daily.   nitroGLYCERIN (NITROSTAT) 0.4 MG SL tablet Place 0.4 mg under the tongue every 5 (five) minutes as needed for chest pain.   OLANZapine (ZYPREXA) 10 MG tablet Take 10 mg by mouth at bedtime.   oxyCODONE (OXY IR/ROXICODONE) 5 MG immediate release tablet Take 1-2 tablets (5-10 mg total) by mouth every 4 (four) hours as needed for severe pain.   rosuvastatin (CRESTOR) 40 MG tablet Take 1 tablet (40 mg total) by mouth daily.   tamsulosin (FLOMAX) 0.4 MG CAPS capsule Take 0.4 mg by mouth daily.   [DISCONTINUED] potassium chloride SA (KLOR-CON) 20 MEQ tablet Take 1 tablet (20 mEq total) by mouth daily.     Allergies:   Enalapril   Social History   Socioeconomic History   Marital status: Married    Spouse name: Not on file    Number of children: 3   Years of education: Not on file   Highest education level: Not on file  Occupational History   Occupation: landscaping    Comment: retired, works part time  Tobacco Use   Smoking status: Former    Types: Cigarettes    Quit date: 05/17/1973    Years since quitting: 48.3   Smokeless tobacco: Never  Vaping Use   Vaping Use: Never used  Substance and Sexual Activity   Alcohol use: No   Drug use: No   Sexual activity: Not on file  Other Topics Concern   Not on file  Social History Narrative   Still works as Development worker, international [employer].      Social Determinants of Health   Financial Resource Strain: Not on file  Food Insecurity: Not on file  Transportation Needs: Not on file  Physical Activity: Not on file  Stress: Not on file  Social Connections: Not on file     Family History: The patient's family history includes Heart attack in his maternal grandmother; Heart attack (age of onset: 65) in his mother; Stroke in his maternal grandfather.  ROS:   Please see the history of present illness.     All other systems reviewed and are negative.  EKGs/Labs/Other Studies Reviewed:    The following studies were reviewed today:  Cath 08/16/2021  Ost Cx to Prox Cx lesion is 75% stenosed.   2nd Mrg lesion is 80% stenosed.   1st Mrg lesion is 90% stenosed.   Ost LAD to Prox LAD lesion is 25% stenosed.   1st Diag lesion is 90% stenosed.   Mid LAD lesion is 90% stenosed.   Dist LAD lesion is 100% stenosed.  Flow restored with a wire.  Unable to advance balloon.   Mid RCA lesion is 25% stenosed.   RPAV lesion is 90% stenosed.   Balloon angioplasty was performed using a BALLOON SAPPHIRE 1.0X10.   Post intervention, there is a 95% residual stenosis.   There is mild to moderate left ventricular systolic dysfunction.   LV end diastolic pressure is normal.   The left ventricular ejection fraction is 45-50% by visual estimate.   There is no aortic valve stenosis.   Severe disease  in the proximal to mid LAD, and ostial circumflex.Marland Kitchen  Heavily calcified lesion at the origin of a large first diagonal.  Culprit for today's presentation was an occlusion in the mid LAD, past  the areas of heavy calcification.  This was successfully wired and flow restored, but a balloon could not be advanced through the heavily calcific disease more proximally.     Will obtain cardiac surgery consult.  If surgery not an option, will have to see if there is an appropriate high risk PCI strategy.   Continue IV heparin after 8 hours post sheath removal.  IV tirofiban started in the Cath Lab which will continue for 12 hours.   Severe tortuosity of the right subclavian requiring 75 cm destination sheath.  EKG:  EKG is ordered today.  The ekg ordered today demonstrates normal sinus rhythm, with deep T wave inversion in the anterior leads.  Recent Labs: 08/15/2021: B Natriuretic Peptide 31.0 08/16/2021: ALT 20 08/23/2021: Magnesium 2.3 09/10/2021: BUN 28; Creatinine, Ser 1.47; Hemoglobin 13.9; Platelets 438; Potassium 6.1; Sodium 139  Recent Lipid Panel    Component Value Date/Time   CHOL 180 08/16/2021 0614   CHOL 148 07/29/2019 1448   CHOL 125 05/17/2013 1513   TRIG 89 08/16/2021 0614   TRIG 139 02/21/2014 1632   TRIG 125 05/17/2013 1513   HDL 34 (L) 08/16/2021 0614   HDL 52 07/29/2019 1448   HDL 47 02/21/2014 1632   HDL 39 (L) 05/17/2013 1513   CHOLHDL 5.3 08/16/2021 0614   VLDL 18 08/16/2021 0614   LDLCALC 128 (H) 08/16/2021 0614   LDLCALC 74 07/29/2019 1448   LDLCALC 61 02/21/2014 1632   LDLCALC 61 05/17/2013 1513     Risk Assessment/Calculations:           Physical Exam:    VS:  BP 121/82   Pulse 77   Ht $R'5\' 7"'cH$  (1.702 m)   Wt 143 lb 12.8 oz (65.2 kg)   SpO2 94%   BMI 22.52 kg/m     Wt Readings from Last 3 Encounters:  09/10/21 143 lb 12.8 oz (65.2 kg)  08/30/21 147 lb 7.8 oz (66.9 kg)  08/29/19 149 lb 12.8 oz (67.9 kg)     GEN:  Well nourished, well developed in no  acute distress HEENT: Normal NECK: No JVD; No carotid bruits LYMPHATICS: No lymphadenopathy CARDIAC: RRR, no murmurs, rubs, gallops RESPIRATORY:  Clear to auscultation without rales, wheezing or rhonchi  ABDOMEN: Soft, non-tender, non-distended MUSCULOSKELETAL:  No edema; No deformity  SKIN: Warm and dry NEUROLOGIC:  Alert and oriented x 3 PSYCHIATRIC:  Normal affect   ASSESSMENT:    1. Coronary artery disease involving coronary bypass graft of native heart with angina pectoris ([place])   2. Primary hypertension   3. Hyperlipidemia LDL goal <70   4. Controlled type 2 diabetes mellitus without complication, without long-term current use of insulin ([place])   5. AKI (acute kidney injury) ([place])   6. Anemia, unspecified type    PLAN:    In order of problems listed above:  CAD s/p CABG: Patient continued to have chest pain.  This is similar to his chest pain in the hospital.  Prior to discharge, he had recurrent chest pain with EKG changes and was reviewed by Dr. Martinique.  Dr. Martinique did not feel he is interventional candidate given diffusely diseased LAD.  EKG today shows deep T wave inversions in the anterior leads.  I will continue with medical management by increase Imdur to 60 mg daily.  Hypertension: Increase Imdur to 60 mg daily  Hyperlipidemia: Continue Crestor.  Fasting lipid panel and 50 in 4 weeks  DM2: Managed by primary care provider  AKI: Obtain basic  metabolic panel  Postop anemia: Obtain CBC        Medication Adjustments/Labs and Tests Ordered: Current medicines are reviewed at length with the patient today.  Concerns regarding medicines are outlined above.  Orders Placed This Encounter  Procedures   CBC   Basic Metabolic Panel (BMET)   Lipid panel   Hepatic function panel   EKG 12-Lead   Meds ordered this encounter  Medications   isosorbide mononitrate (IMDUR) 60 MG 24 hr tablet    Sig: Take 1 tablet (60 mg total) by mouth daily.    Dispense:  90 tablet     Refill:  1    Patient Instructions  Medication Instructions:  Increase Imdur to 60 mg (2 tabs of 30 mg) daily Stop Potassium  Stop Lasix *If you need a refill on your cardiac medications before your next appointment, please call your pharmacy*   Lab Work: Your physician recommends that you return for lab work today:  BMET [place]  Your physician recommends that you return for lab work on follow up appointment:  Fasting Lipid Panel-DO NOT EAT OR DRINK PAST MIDNIGHT. OKAY TO HAVE WATER.  Hepatic (Liver) Function Test   If you have labs (blood work) drawn today and your tests are completely normal, you will receive your results only by: MyChart Message (if you have MyChart) OR A paper copy in the mail If you have any lab test that is abnormal or we need to change your treatment, we will call you to review the results.   Testing/Procedures: NONE ordered at this time of appointment    Follow-Up: At [facility], you and your health needs are our priority.  As part of our continuing mission to provide you with exceptional heart care, we have created designated Provider Care Teams.  These Care Teams include your primary Cardiologist (physician) and Advanced Practice Providers (APPs -  Physician Assistants and Nurse Practitioners) who all work together to provide you with the care you need, when you need it.  We recommend signing up for the patient portal called "MyChart".  Sign up information is provided on this After Visit Summary.  MyChart is used to connect with patients for Virtual Visits (Telemedicine).  Patients are able to view lab/test results, encounter notes, upcoming appointments, etc.  Non-urgent messages can be sent to your provider as well.   To learn more about what you can do with MyChart, go to [email or web address].    Your next appointment:   2-3 week(s) 3 months  The format for your next appointment:   In Person In Person   Provider:   APP on a day with Dr.  Percival Spanish in office  Dr. Percival Spanish  Other Instructions    Signed, Almyra Deforest, [place]  09/12/2021 11:20 PM    [place]

## 2021-09-10 NOTE — Patient Instructions (Addendum)
Medication Instructions:  Increase Imdur to 60 mg (2 tabs of 30 mg) daily Stop Potassium  Stop Lasix *If you need a refill on your cardiac medications before your next appointment, please call your pharmacy*   Lab Work: Your physician recommends that you return for lab work today:  BMET [place] recommends that you return for lab work on follow up appointment:  Fasting Lipid Panel-DO NOT EAT OR DRINK PAST MIDNIGHT. OKAY TO HAVE WATER.  Hepatic (Liver) Function Test   If you have labs (blood work) drawn today and your tests are completely normal, you will receive your results only by: MyChart Message (if you have MyChart) OR A paper copy in the mail If you have any lab test that is abnormal or we need to change your treatment, we will call you to review the results.   Testing/Procedures: NONE ordered at this time of appointment    Follow-Up: At [facility], you and your health needs are our priority.  As part of our continuing mission to provide you with exceptional heart care, we have created designated Provider Care Teams.  These Care Teams include your primary Cardiologist (physician) and Advanced Practice Providers (APPs -  Physician Assistants and Nurse Practitioners) who all work together to provide you with the care you need, when you need it.  We recommend signing up for the patient portal called "MyChart".  Sign up information is provided on this After Visit Summary.  MyChart is used to connect with patients for Virtual Visits (Telemedicine).  Patients are able to view lab/test results, encounter notes, upcoming appointments, etc.  Non-urgent messages can be sent to your provider as well.   To learn more about what you can do with MyChart, go to [email or web address].    Your next appointment:   2-3 week(s) 3 months  The format for your next appointment:   In Person In Person   Provider:   APP on a day with Dr. Percival Spanish in office  Dr.  Percival Spanish  Other Instructions

## 2021-09-11 ENCOUNTER — Other Ambulatory Visit: Payer: Self-pay

## 2021-09-11 DIAGNOSIS — Z951 Presence of aortocoronary bypass graft: Secondary | ICD-10-CM | POA: Diagnosis not present

## 2021-09-11 DIAGNOSIS — Z7982 Long term (current) use of aspirin: Secondary | ICD-10-CM | POA: Diagnosis not present

## 2021-09-11 DIAGNOSIS — Z7902 Long term (current) use of antithrombotics/antiplatelets: Secondary | ICD-10-CM | POA: Diagnosis not present

## 2021-09-11 DIAGNOSIS — E441 Mild protein-calorie malnutrition: Secondary | ICD-10-CM | POA: Diagnosis not present

## 2021-09-11 DIAGNOSIS — N400 Enlarged prostate without lower urinary tract symptoms: Secondary | ICD-10-CM | POA: Diagnosis not present

## 2021-09-11 DIAGNOSIS — E1159 Type 2 diabetes mellitus with other circulatory complications: Secondary | ICD-10-CM | POA: Diagnosis not present

## 2021-09-11 DIAGNOSIS — I2109 ST elevation (STEMI) myocardial infarction involving other coronary artery of anterior wall: Secondary | ICD-10-CM | POA: Diagnosis not present

## 2021-09-11 DIAGNOSIS — Z7984 Long term (current) use of oral hypoglycemic drugs: Secondary | ICD-10-CM | POA: Diagnosis not present

## 2021-09-11 DIAGNOSIS — E785 Hyperlipidemia, unspecified: Secondary | ICD-10-CM | POA: Diagnosis not present

## 2021-09-11 DIAGNOSIS — I152 Hypertension secondary to endocrine disorders: Secondary | ICD-10-CM | POA: Diagnosis not present

## 2021-09-11 DIAGNOSIS — E1169 Type 2 diabetes mellitus with other specified complication: Secondary | ICD-10-CM | POA: Diagnosis not present

## 2021-09-11 DIAGNOSIS — Z794 Long term (current) use of insulin: Secondary | ICD-10-CM | POA: Diagnosis not present

## 2021-09-11 DIAGNOSIS — Z8551 Personal history of malignant neoplasm of bladder: Secondary | ICD-10-CM | POA: Diagnosis not present

## 2021-09-11 DIAGNOSIS — E559 Vitamin D deficiency, unspecified: Secondary | ICD-10-CM | POA: Diagnosis not present

## 2021-09-11 DIAGNOSIS — E1165 Type 2 diabetes mellitus with hyperglycemia: Secondary | ICD-10-CM | POA: Diagnosis not present

## 2021-09-11 DIAGNOSIS — Z48812 Encounter for surgical aftercare following surgery on the circulatory system: Secondary | ICD-10-CM | POA: Diagnosis not present

## 2021-09-11 DIAGNOSIS — N179 Acute kidney failure, unspecified: Secondary | ICD-10-CM | POA: Diagnosis not present

## 2021-09-11 DIAGNOSIS — E875 Hyperkalemia: Secondary | ICD-10-CM

## 2021-09-11 DIAGNOSIS — R911 Solitary pulmonary nodule: Secondary | ICD-10-CM | POA: Diagnosis not present

## 2021-09-11 LAB — CBC
Hematocrit: 44.5 % (ref 37.5–51.0)
Hemoglobin: 13.9 g/dL (ref 13.0–17.7)
MCH: 26.7 pg (ref 26.6–33.0)
MCHC: 31.2 g/dL — ABNORMAL LOW (ref 31.5–35.7)
MCV: 86 fL (ref 79–97)
Platelets: 438 10*3/uL (ref 150–450)
RBC: 5.2 x10E6/uL (ref 4.14–5.80)
RDW: 13.9 % (ref 11.6–15.4)
WBC: 5.9 10*3/uL (ref 3.4–10.8)

## 2021-09-11 LAB — BASIC METABOLIC PANEL
BUN/Creatinine Ratio: 19 (ref 10–24)
BUN: 28 mg/dL — ABNORMAL HIGH (ref 8–27)
CO2: 18 mmol/L — ABNORMAL LOW (ref 20–29)
Calcium: 10.3 mg/dL — ABNORMAL HIGH (ref 8.6–10.2)
Chloride: 99 mmol/L (ref 96–106)
Creatinine, Ser: 1.47 mg/dL — ABNORMAL HIGH (ref 0.76–1.27)
Glucose: 129 mg/dL — ABNORMAL HIGH (ref 70–99)
Potassium: 6.1 mmol/L (ref 3.5–5.2)
Sodium: 139 mmol/L (ref 134–144)
eGFR: 49 mL/min/{1.73_m2} — ABNORMAL LOW (ref >59)

## 2021-09-11 NOTE — Progress Notes (Signed)
Red blood cell count improved, kidney function slightly worsened suggestive of mild dehydration. Need to increase fluid intake. Potassium very high, I stopped his potassium supplement yesterday. I called the patient's wife, will need family to pick up 2 packets of lokelma today and take 1 packet per dayfor 2 days. Will need repeat BMET lab either this Friday or next Monday

## 2021-09-12 ENCOUNTER — Encounter: Payer: Self-pay | Admitting: Physician Assistant

## 2021-09-13 DIAGNOSIS — Z8551 Personal history of malignant neoplasm of bladder: Secondary | ICD-10-CM | POA: Diagnosis not present

## 2021-09-13 DIAGNOSIS — I152 Hypertension secondary to endocrine disorders: Secondary | ICD-10-CM | POA: Diagnosis not present

## 2021-09-13 DIAGNOSIS — I2109 ST elevation (STEMI) myocardial infarction involving other coronary artery of anterior wall: Secondary | ICD-10-CM | POA: Diagnosis not present

## 2021-09-13 DIAGNOSIS — E1165 Type 2 diabetes mellitus with hyperglycemia: Secondary | ICD-10-CM | POA: Diagnosis not present

## 2021-09-13 DIAGNOSIS — N179 Acute kidney failure, unspecified: Secondary | ICD-10-CM | POA: Diagnosis not present

## 2021-09-13 DIAGNOSIS — Z48812 Encounter for surgical aftercare following surgery on the circulatory system: Secondary | ICD-10-CM | POA: Diagnosis not present

## 2021-09-13 DIAGNOSIS — R911 Solitary pulmonary nodule: Secondary | ICD-10-CM | POA: Diagnosis not present

## 2021-09-13 DIAGNOSIS — Z794 Long term (current) use of insulin: Secondary | ICD-10-CM | POA: Diagnosis not present

## 2021-09-13 DIAGNOSIS — Z7902 Long term (current) use of antithrombotics/antiplatelets: Secondary | ICD-10-CM | POA: Diagnosis not present

## 2021-09-13 DIAGNOSIS — N400 Enlarged prostate without lower urinary tract symptoms: Secondary | ICD-10-CM | POA: Diagnosis not present

## 2021-09-13 DIAGNOSIS — E441 Mild protein-calorie malnutrition: Secondary | ICD-10-CM | POA: Diagnosis not present

## 2021-09-13 DIAGNOSIS — Z951 Presence of aortocoronary bypass graft: Secondary | ICD-10-CM | POA: Diagnosis not present

## 2021-09-13 DIAGNOSIS — E1169 Type 2 diabetes mellitus with other specified complication: Secondary | ICD-10-CM | POA: Diagnosis not present

## 2021-09-13 DIAGNOSIS — Z7982 Long term (current) use of aspirin: Secondary | ICD-10-CM | POA: Diagnosis not present

## 2021-09-13 DIAGNOSIS — E785 Hyperlipidemia, unspecified: Secondary | ICD-10-CM | POA: Diagnosis not present

## 2021-09-13 DIAGNOSIS — E1159 Type 2 diabetes mellitus with other circulatory complications: Secondary | ICD-10-CM | POA: Diagnosis not present

## 2021-09-13 DIAGNOSIS — E559 Vitamin D deficiency, unspecified: Secondary | ICD-10-CM | POA: Diagnosis not present

## 2021-09-13 DIAGNOSIS — Z7984 Long term (current) use of oral hypoglycemic drugs: Secondary | ICD-10-CM | POA: Diagnosis not present

## 2021-09-16 DIAGNOSIS — Z951 Presence of aortocoronary bypass graft: Secondary | ICD-10-CM | POA: Diagnosis not present

## 2021-09-16 DIAGNOSIS — E1159 Type 2 diabetes mellitus with other circulatory complications: Secondary | ICD-10-CM | POA: Diagnosis not present

## 2021-09-16 DIAGNOSIS — N179 Acute kidney failure, unspecified: Secondary | ICD-10-CM | POA: Diagnosis not present

## 2021-09-16 DIAGNOSIS — E1165 Type 2 diabetes mellitus with hyperglycemia: Secondary | ICD-10-CM | POA: Diagnosis not present

## 2021-09-16 DIAGNOSIS — Z7902 Long term (current) use of antithrombotics/antiplatelets: Secondary | ICD-10-CM | POA: Diagnosis not present

## 2021-09-16 DIAGNOSIS — N400 Enlarged prostate without lower urinary tract symptoms: Secondary | ICD-10-CM | POA: Diagnosis not present

## 2021-09-16 DIAGNOSIS — I2109 ST elevation (STEMI) myocardial infarction involving other coronary artery of anterior wall: Secondary | ICD-10-CM | POA: Diagnosis not present

## 2021-09-16 DIAGNOSIS — E1169 Type 2 diabetes mellitus with other specified complication: Secondary | ICD-10-CM | POA: Diagnosis not present

## 2021-09-16 DIAGNOSIS — Z7984 Long term (current) use of oral hypoglycemic drugs: Secondary | ICD-10-CM | POA: Diagnosis not present

## 2021-09-16 DIAGNOSIS — Z7982 Long term (current) use of aspirin: Secondary | ICD-10-CM | POA: Diagnosis not present

## 2021-09-16 DIAGNOSIS — E441 Mild protein-calorie malnutrition: Secondary | ICD-10-CM | POA: Diagnosis not present

## 2021-09-16 DIAGNOSIS — E559 Vitamin D deficiency, unspecified: Secondary | ICD-10-CM | POA: Diagnosis not present

## 2021-09-16 DIAGNOSIS — Z48812 Encounter for surgical aftercare following surgery on the circulatory system: Secondary | ICD-10-CM | POA: Diagnosis not present

## 2021-09-16 DIAGNOSIS — Z8551 Personal history of malignant neoplasm of bladder: Secondary | ICD-10-CM | POA: Diagnosis not present

## 2021-09-16 DIAGNOSIS — Z794 Long term (current) use of insulin: Secondary | ICD-10-CM | POA: Diagnosis not present

## 2021-09-16 DIAGNOSIS — R911 Solitary pulmonary nodule: Secondary | ICD-10-CM | POA: Diagnosis not present

## 2021-09-16 DIAGNOSIS — I152 Hypertension secondary to endocrine disorders: Secondary | ICD-10-CM | POA: Diagnosis not present

## 2021-09-16 DIAGNOSIS — E785 Hyperlipidemia, unspecified: Secondary | ICD-10-CM | POA: Diagnosis not present

## 2021-09-17 ENCOUNTER — Other Ambulatory Visit: Payer: Self-pay

## 2021-09-17 DIAGNOSIS — E785 Hyperlipidemia, unspecified: Secondary | ICD-10-CM

## 2021-09-17 DIAGNOSIS — E875 Hyperkalemia: Secondary | ICD-10-CM | POA: Diagnosis not present

## 2021-09-18 LAB — BASIC METABOLIC PANEL
BUN/Creatinine Ratio: 12 (ref 10–24)
BUN: 17 mg/dL (ref 8–27)
CO2: 21 mmol/L (ref 20–29)
Calcium: 9.5 mg/dL (ref 8.6–10.2)
Chloride: 104 mmol/L (ref 96–106)
Creatinine, Ser: 1.44 mg/dL — ABNORMAL HIGH (ref 0.76–1.27)
Glucose: 54 mg/dL — ABNORMAL LOW (ref 70–99)
Potassium: 4 mmol/L (ref 3.5–5.2)
Sodium: 147 mmol/L — ABNORMAL HIGH (ref 134–144)
eGFR: 50 mL/min/{1.73_m2} — ABNORMAL LOW (ref >59)

## 2021-09-18 NOTE — Progress Notes (Signed)
Potassium normalized. Stable renal function.

## 2021-09-20 DIAGNOSIS — N179 Acute kidney failure, unspecified: Secondary | ICD-10-CM | POA: Diagnosis not present

## 2021-09-20 DIAGNOSIS — Z794 Long term (current) use of insulin: Secondary | ICD-10-CM | POA: Diagnosis not present

## 2021-09-20 DIAGNOSIS — N400 Enlarged prostate without lower urinary tract symptoms: Secondary | ICD-10-CM | POA: Diagnosis not present

## 2021-09-20 DIAGNOSIS — Z7902 Long term (current) use of antithrombotics/antiplatelets: Secondary | ICD-10-CM | POA: Diagnosis not present

## 2021-09-20 DIAGNOSIS — E1169 Type 2 diabetes mellitus with other specified complication: Secondary | ICD-10-CM | POA: Diagnosis not present

## 2021-09-20 DIAGNOSIS — Z8551 Personal history of malignant neoplasm of bladder: Secondary | ICD-10-CM | POA: Diagnosis not present

## 2021-09-20 DIAGNOSIS — Z7984 Long term (current) use of oral hypoglycemic drugs: Secondary | ICD-10-CM | POA: Diagnosis not present

## 2021-09-20 DIAGNOSIS — I152 Hypertension secondary to endocrine disorders: Secondary | ICD-10-CM | POA: Diagnosis not present

## 2021-09-20 DIAGNOSIS — E785 Hyperlipidemia, unspecified: Secondary | ICD-10-CM | POA: Diagnosis not present

## 2021-09-20 DIAGNOSIS — Z7982 Long term (current) use of aspirin: Secondary | ICD-10-CM | POA: Diagnosis not present

## 2021-09-20 DIAGNOSIS — E1159 Type 2 diabetes mellitus with other circulatory complications: Secondary | ICD-10-CM | POA: Diagnosis not present

## 2021-09-20 DIAGNOSIS — I2109 ST elevation (STEMI) myocardial infarction involving other coronary artery of anterior wall: Secondary | ICD-10-CM | POA: Diagnosis not present

## 2021-09-20 DIAGNOSIS — Z48812 Encounter for surgical aftercare following surgery on the circulatory system: Secondary | ICD-10-CM | POA: Diagnosis not present

## 2021-09-20 DIAGNOSIS — E559 Vitamin D deficiency, unspecified: Secondary | ICD-10-CM | POA: Diagnosis not present

## 2021-09-20 DIAGNOSIS — E1165 Type 2 diabetes mellitus with hyperglycemia: Secondary | ICD-10-CM | POA: Diagnosis not present

## 2021-09-20 DIAGNOSIS — Z951 Presence of aortocoronary bypass graft: Secondary | ICD-10-CM | POA: Diagnosis not present

## 2021-09-20 DIAGNOSIS — R911 Solitary pulmonary nodule: Secondary | ICD-10-CM | POA: Diagnosis not present

## 2021-09-20 DIAGNOSIS — E441 Mild protein-calorie malnutrition: Secondary | ICD-10-CM | POA: Diagnosis not present

## 2021-09-23 DIAGNOSIS — Z23 Encounter for immunization: Secondary | ICD-10-CM | POA: Diagnosis not present

## 2021-09-23 DIAGNOSIS — Z7902 Long term (current) use of antithrombotics/antiplatelets: Secondary | ICD-10-CM | POA: Diagnosis not present

## 2021-09-23 DIAGNOSIS — E1159 Type 2 diabetes mellitus with other circulatory complications: Secondary | ICD-10-CM | POA: Diagnosis not present

## 2021-09-23 DIAGNOSIS — I214 Non-ST elevation (NSTEMI) myocardial infarction: Secondary | ICD-10-CM

## 2021-09-23 DIAGNOSIS — N179 Acute kidney failure, unspecified: Secondary | ICD-10-CM | POA: Diagnosis not present

## 2021-09-23 DIAGNOSIS — R911 Solitary pulmonary nodule: Secondary | ICD-10-CM | POA: Diagnosis not present

## 2021-09-23 DIAGNOSIS — Z0000 Encounter for general adult medical examination without abnormal findings: Secondary | ICD-10-CM | POA: Diagnosis not present

## 2021-09-23 DIAGNOSIS — Z7984 Long term (current) use of oral hypoglycemic drugs: Secondary | ICD-10-CM | POA: Diagnosis not present

## 2021-09-23 DIAGNOSIS — I2109 ST elevation (STEMI) myocardial infarction involving other coronary artery of anterior wall: Secondary | ICD-10-CM | POA: Diagnosis not present

## 2021-09-23 DIAGNOSIS — Z8551 Personal history of malignant neoplasm of bladder: Secondary | ICD-10-CM | POA: Diagnosis not present

## 2021-09-23 DIAGNOSIS — Z951 Presence of aortocoronary bypass graft: Secondary | ICD-10-CM | POA: Diagnosis not present

## 2021-09-23 DIAGNOSIS — N400 Enlarged prostate without lower urinary tract symptoms: Secondary | ICD-10-CM | POA: Diagnosis not present

## 2021-09-23 DIAGNOSIS — E8809 Other disorders of plasma-protein metabolism, not elsewhere classified: Secondary | ICD-10-CM | POA: Diagnosis not present

## 2021-09-23 DIAGNOSIS — E1169 Type 2 diabetes mellitus with other specified complication: Secondary | ICD-10-CM | POA: Diagnosis not present

## 2021-09-23 DIAGNOSIS — I10 Essential (primary) hypertension: Secondary | ICD-10-CM | POA: Diagnosis not present

## 2021-09-23 DIAGNOSIS — Z794 Long term (current) use of insulin: Secondary | ICD-10-CM | POA: Diagnosis not present

## 2021-09-23 DIAGNOSIS — E1165 Type 2 diabetes mellitus with hyperglycemia: Secondary | ICD-10-CM | POA: Diagnosis not present

## 2021-09-23 DIAGNOSIS — E441 Mild protein-calorie malnutrition: Secondary | ICD-10-CM | POA: Diagnosis not present

## 2021-09-23 DIAGNOSIS — I152 Hypertension secondary to endocrine disorders: Secondary | ICD-10-CM | POA: Diagnosis not present

## 2021-09-23 DIAGNOSIS — Z48812 Encounter for surgical aftercare following surgery on the circulatory system: Secondary | ICD-10-CM | POA: Diagnosis not present

## 2021-09-23 DIAGNOSIS — E785 Hyperlipidemia, unspecified: Secondary | ICD-10-CM | POA: Diagnosis not present

## 2021-09-23 DIAGNOSIS — Z7982 Long term (current) use of aspirin: Secondary | ICD-10-CM | POA: Diagnosis not present

## 2021-09-23 DIAGNOSIS — E559 Vitamin D deficiency, unspecified: Secondary | ICD-10-CM | POA: Diagnosis not present

## 2021-09-23 DIAGNOSIS — E46 Unspecified protein-calorie malnutrition: Secondary | ICD-10-CM | POA: Diagnosis not present

## 2021-09-25 DIAGNOSIS — N400 Enlarged prostate without lower urinary tract symptoms: Secondary | ICD-10-CM | POA: Diagnosis not present

## 2021-09-25 DIAGNOSIS — N179 Acute kidney failure, unspecified: Secondary | ICD-10-CM | POA: Diagnosis not present

## 2021-09-25 DIAGNOSIS — E441 Mild protein-calorie malnutrition: Secondary | ICD-10-CM | POA: Diagnosis not present

## 2021-09-25 DIAGNOSIS — I152 Hypertension secondary to endocrine disorders: Secondary | ICD-10-CM | POA: Diagnosis not present

## 2021-09-25 DIAGNOSIS — E785 Hyperlipidemia, unspecified: Secondary | ICD-10-CM | POA: Diagnosis not present

## 2021-09-25 DIAGNOSIS — E559 Vitamin D deficiency, unspecified: Secondary | ICD-10-CM | POA: Diagnosis not present

## 2021-09-25 DIAGNOSIS — Z951 Presence of aortocoronary bypass graft: Secondary | ICD-10-CM | POA: Diagnosis not present

## 2021-09-25 DIAGNOSIS — I2109 ST elevation (STEMI) myocardial infarction involving other coronary artery of anterior wall: Secondary | ICD-10-CM | POA: Diagnosis not present

## 2021-09-25 DIAGNOSIS — Z48812 Encounter for surgical aftercare following surgery on the circulatory system: Secondary | ICD-10-CM | POA: Diagnosis not present

## 2021-09-25 DIAGNOSIS — Z8551 Personal history of malignant neoplasm of bladder: Secondary | ICD-10-CM | POA: Diagnosis not present

## 2021-09-25 DIAGNOSIS — R911 Solitary pulmonary nodule: Secondary | ICD-10-CM | POA: Diagnosis not present

## 2021-09-25 DIAGNOSIS — Z7982 Long term (current) use of aspirin: Secondary | ICD-10-CM | POA: Diagnosis not present

## 2021-09-25 DIAGNOSIS — E1165 Type 2 diabetes mellitus with hyperglycemia: Secondary | ICD-10-CM | POA: Diagnosis not present

## 2021-09-25 DIAGNOSIS — Z794 Long term (current) use of insulin: Secondary | ICD-10-CM | POA: Diagnosis not present

## 2021-09-25 DIAGNOSIS — E1169 Type 2 diabetes mellitus with other specified complication: Secondary | ICD-10-CM | POA: Diagnosis not present

## 2021-09-25 DIAGNOSIS — E1159 Type 2 diabetes mellitus with other circulatory complications: Secondary | ICD-10-CM | POA: Diagnosis not present

## 2021-09-25 DIAGNOSIS — Z7984 Long term (current) use of oral hypoglycemic drugs: Secondary | ICD-10-CM | POA: Diagnosis not present

## 2021-09-25 DIAGNOSIS — Z7902 Long term (current) use of antithrombotics/antiplatelets: Secondary | ICD-10-CM | POA: Diagnosis not present

## 2021-09-26 DIAGNOSIS — Z951 Presence of aortocoronary bypass graft: Secondary | ICD-10-CM | POA: Diagnosis not present

## 2021-09-26 DIAGNOSIS — E1159 Type 2 diabetes mellitus with other circulatory complications: Secondary | ICD-10-CM | POA: Diagnosis not present

## 2021-09-26 DIAGNOSIS — N400 Enlarged prostate without lower urinary tract symptoms: Secondary | ICD-10-CM | POA: Diagnosis not present

## 2021-09-26 DIAGNOSIS — E441 Mild protein-calorie malnutrition: Secondary | ICD-10-CM | POA: Diagnosis not present

## 2021-09-26 DIAGNOSIS — N179 Acute kidney failure, unspecified: Secondary | ICD-10-CM | POA: Diagnosis not present

## 2021-09-26 DIAGNOSIS — I152 Hypertension secondary to endocrine disorders: Secondary | ICD-10-CM | POA: Diagnosis not present

## 2021-09-26 DIAGNOSIS — I2109 ST elevation (STEMI) myocardial infarction involving other coronary artery of anterior wall: Secondary | ICD-10-CM | POA: Diagnosis not present

## 2021-09-26 DIAGNOSIS — E785 Hyperlipidemia, unspecified: Secondary | ICD-10-CM | POA: Diagnosis not present

## 2021-09-26 DIAGNOSIS — Z8551 Personal history of malignant neoplasm of bladder: Secondary | ICD-10-CM | POA: Diagnosis not present

## 2021-09-26 DIAGNOSIS — E559 Vitamin D deficiency, unspecified: Secondary | ICD-10-CM | POA: Diagnosis not present

## 2021-09-26 DIAGNOSIS — Z7902 Long term (current) use of antithrombotics/antiplatelets: Secondary | ICD-10-CM | POA: Diagnosis not present

## 2021-09-26 DIAGNOSIS — R911 Solitary pulmonary nodule: Secondary | ICD-10-CM | POA: Diagnosis not present

## 2021-09-26 DIAGNOSIS — Z48812 Encounter for surgical aftercare following surgery on the circulatory system: Secondary | ICD-10-CM | POA: Diagnosis not present

## 2021-09-26 DIAGNOSIS — E1169 Type 2 diabetes mellitus with other specified complication: Secondary | ICD-10-CM | POA: Diagnosis not present

## 2021-09-26 DIAGNOSIS — Z7982 Long term (current) use of aspirin: Secondary | ICD-10-CM | POA: Diagnosis not present

## 2021-09-26 DIAGNOSIS — Z7984 Long term (current) use of oral hypoglycemic drugs: Secondary | ICD-10-CM | POA: Diagnosis not present

## 2021-09-26 DIAGNOSIS — E1165 Type 2 diabetes mellitus with hyperglycemia: Secondary | ICD-10-CM | POA: Diagnosis not present

## 2021-09-26 DIAGNOSIS — Z794 Long term (current) use of insulin: Secondary | ICD-10-CM | POA: Diagnosis not present

## 2021-09-30 ENCOUNTER — Other Ambulatory Visit: Payer: Self-pay | Admitting: Thoracic Surgery (Cardiothoracic Vascular Surgery)

## 2021-09-30 DIAGNOSIS — E1165 Type 2 diabetes mellitus with hyperglycemia: Secondary | ICD-10-CM | POA: Diagnosis not present

## 2021-09-30 DIAGNOSIS — Z48812 Encounter for surgical aftercare following surgery on the circulatory system: Secondary | ICD-10-CM | POA: Diagnosis not present

## 2021-09-30 DIAGNOSIS — E441 Mild protein-calorie malnutrition: Secondary | ICD-10-CM | POA: Diagnosis not present

## 2021-09-30 DIAGNOSIS — E1169 Type 2 diabetes mellitus with other specified complication: Secondary | ICD-10-CM | POA: Diagnosis not present

## 2021-09-30 DIAGNOSIS — I2109 ST elevation (STEMI) myocardial infarction involving other coronary artery of anterior wall: Secondary | ICD-10-CM | POA: Diagnosis not present

## 2021-09-30 DIAGNOSIS — Z8551 Personal history of malignant neoplasm of bladder: Secondary | ICD-10-CM | POA: Diagnosis not present

## 2021-09-30 DIAGNOSIS — E559 Vitamin D deficiency, unspecified: Secondary | ICD-10-CM | POA: Diagnosis not present

## 2021-09-30 DIAGNOSIS — R911 Solitary pulmonary nodule: Secondary | ICD-10-CM | POA: Diagnosis not present

## 2021-09-30 DIAGNOSIS — N400 Enlarged prostate without lower urinary tract symptoms: Secondary | ICD-10-CM | POA: Diagnosis not present

## 2021-09-30 DIAGNOSIS — E785 Hyperlipidemia, unspecified: Secondary | ICD-10-CM | POA: Diagnosis not present

## 2021-09-30 DIAGNOSIS — Z7984 Long term (current) use of oral hypoglycemic drugs: Secondary | ICD-10-CM | POA: Diagnosis not present

## 2021-09-30 DIAGNOSIS — Z7982 Long term (current) use of aspirin: Secondary | ICD-10-CM | POA: Diagnosis not present

## 2021-09-30 DIAGNOSIS — I152 Hypertension secondary to endocrine disorders: Secondary | ICD-10-CM | POA: Diagnosis not present

## 2021-09-30 DIAGNOSIS — Z7902 Long term (current) use of antithrombotics/antiplatelets: Secondary | ICD-10-CM | POA: Diagnosis not present

## 2021-09-30 DIAGNOSIS — Z951 Presence of aortocoronary bypass graft: Secondary | ICD-10-CM | POA: Diagnosis not present

## 2021-09-30 DIAGNOSIS — N179 Acute kidney failure, unspecified: Secondary | ICD-10-CM | POA: Diagnosis not present

## 2021-09-30 DIAGNOSIS — Z794 Long term (current) use of insulin: Secondary | ICD-10-CM | POA: Diagnosis not present

## 2021-09-30 DIAGNOSIS — E1159 Type 2 diabetes mellitus with other circulatory complications: Secondary | ICD-10-CM | POA: Diagnosis not present

## 2021-10-01 DIAGNOSIS — E1165 Type 2 diabetes mellitus with hyperglycemia: Secondary | ICD-10-CM | POA: Diagnosis not present

## 2021-10-01 DIAGNOSIS — N400 Enlarged prostate without lower urinary tract symptoms: Secondary | ICD-10-CM | POA: Diagnosis not present

## 2021-10-01 DIAGNOSIS — Z951 Presence of aortocoronary bypass graft: Secondary | ICD-10-CM | POA: Diagnosis not present

## 2021-10-01 DIAGNOSIS — E785 Hyperlipidemia, unspecified: Secondary | ICD-10-CM | POA: Diagnosis not present

## 2021-10-01 DIAGNOSIS — Z7902 Long term (current) use of antithrombotics/antiplatelets: Secondary | ICD-10-CM | POA: Diagnosis not present

## 2021-10-01 DIAGNOSIS — Z794 Long term (current) use of insulin: Secondary | ICD-10-CM | POA: Diagnosis not present

## 2021-10-01 DIAGNOSIS — E441 Mild protein-calorie malnutrition: Secondary | ICD-10-CM | POA: Diagnosis not present

## 2021-10-01 DIAGNOSIS — R911 Solitary pulmonary nodule: Secondary | ICD-10-CM | POA: Diagnosis not present

## 2021-10-01 DIAGNOSIS — E559 Vitamin D deficiency, unspecified: Secondary | ICD-10-CM | POA: Diagnosis not present

## 2021-10-01 DIAGNOSIS — Z7982 Long term (current) use of aspirin: Secondary | ICD-10-CM | POA: Diagnosis not present

## 2021-10-01 DIAGNOSIS — I152 Hypertension secondary to endocrine disorders: Secondary | ICD-10-CM | POA: Diagnosis not present

## 2021-10-01 DIAGNOSIS — I2109 ST elevation (STEMI) myocardial infarction involving other coronary artery of anterior wall: Secondary | ICD-10-CM | POA: Diagnosis not present

## 2021-10-01 DIAGNOSIS — N179 Acute kidney failure, unspecified: Secondary | ICD-10-CM | POA: Diagnosis not present

## 2021-10-01 DIAGNOSIS — Z48812 Encounter for surgical aftercare following surgery on the circulatory system: Secondary | ICD-10-CM | POA: Diagnosis not present

## 2021-10-01 DIAGNOSIS — E1159 Type 2 diabetes mellitus with other circulatory complications: Secondary | ICD-10-CM | POA: Diagnosis not present

## 2021-10-01 DIAGNOSIS — E1169 Type 2 diabetes mellitus with other specified complication: Secondary | ICD-10-CM | POA: Diagnosis not present

## 2021-10-01 DIAGNOSIS — Z7984 Long term (current) use of oral hypoglycemic drugs: Secondary | ICD-10-CM | POA: Diagnosis not present

## 2021-10-01 DIAGNOSIS — Z8551 Personal history of malignant neoplasm of bladder: Secondary | ICD-10-CM | POA: Diagnosis not present

## 2021-10-04 ENCOUNTER — Other Ambulatory Visit: Payer: Self-pay

## 2021-10-04 ENCOUNTER — Ambulatory Visit: Payer: Medicare HMO | Admitting: Adult Health

## 2021-10-04 ENCOUNTER — Ambulatory Visit
Admission: RE | Admit: 2021-10-04 | Discharge: 2021-10-04 | Disposition: A | Discharge status: Home / Self Care | Payer: Medicare HMO | Source: Ambulatory Visit | Attending: Thoracic Surgery (Cardiothoracic Vascular Surgery) | Admitting: Thoracic Surgery (Cardiothoracic Vascular Surgery)

## 2021-10-04 ENCOUNTER — Ambulatory Visit (INDEPENDENT_AMBULATORY_CARE_PROVIDER_SITE_OTHER): Payer: Self-pay | Admitting: Thoracic Surgery (Cardiothoracic Vascular Surgery)

## 2021-10-04 VITALS — BP 120/69 | HR 72 | Resp 20 | Ht 67.0 in | Wt 145.0 lb

## 2021-10-04 DIAGNOSIS — Z951 Presence of aortocoronary bypass graft: Secondary | ICD-10-CM | POA: Diagnosis not present

## 2021-10-04 DIAGNOSIS — R918 Other nonspecific abnormal finding of lung field: Secondary | ICD-10-CM | POA: Diagnosis not present

## 2021-10-04 IMAGING — CR DG CHEST 2V
2 series · 2 of 2 positions shown · non-contrast
Comparison: Chest x-ray dated [DATE]

CLINICAL DATA: Status post CABG

EXAM:
CHEST - 2 VIEW

[w chest pa]
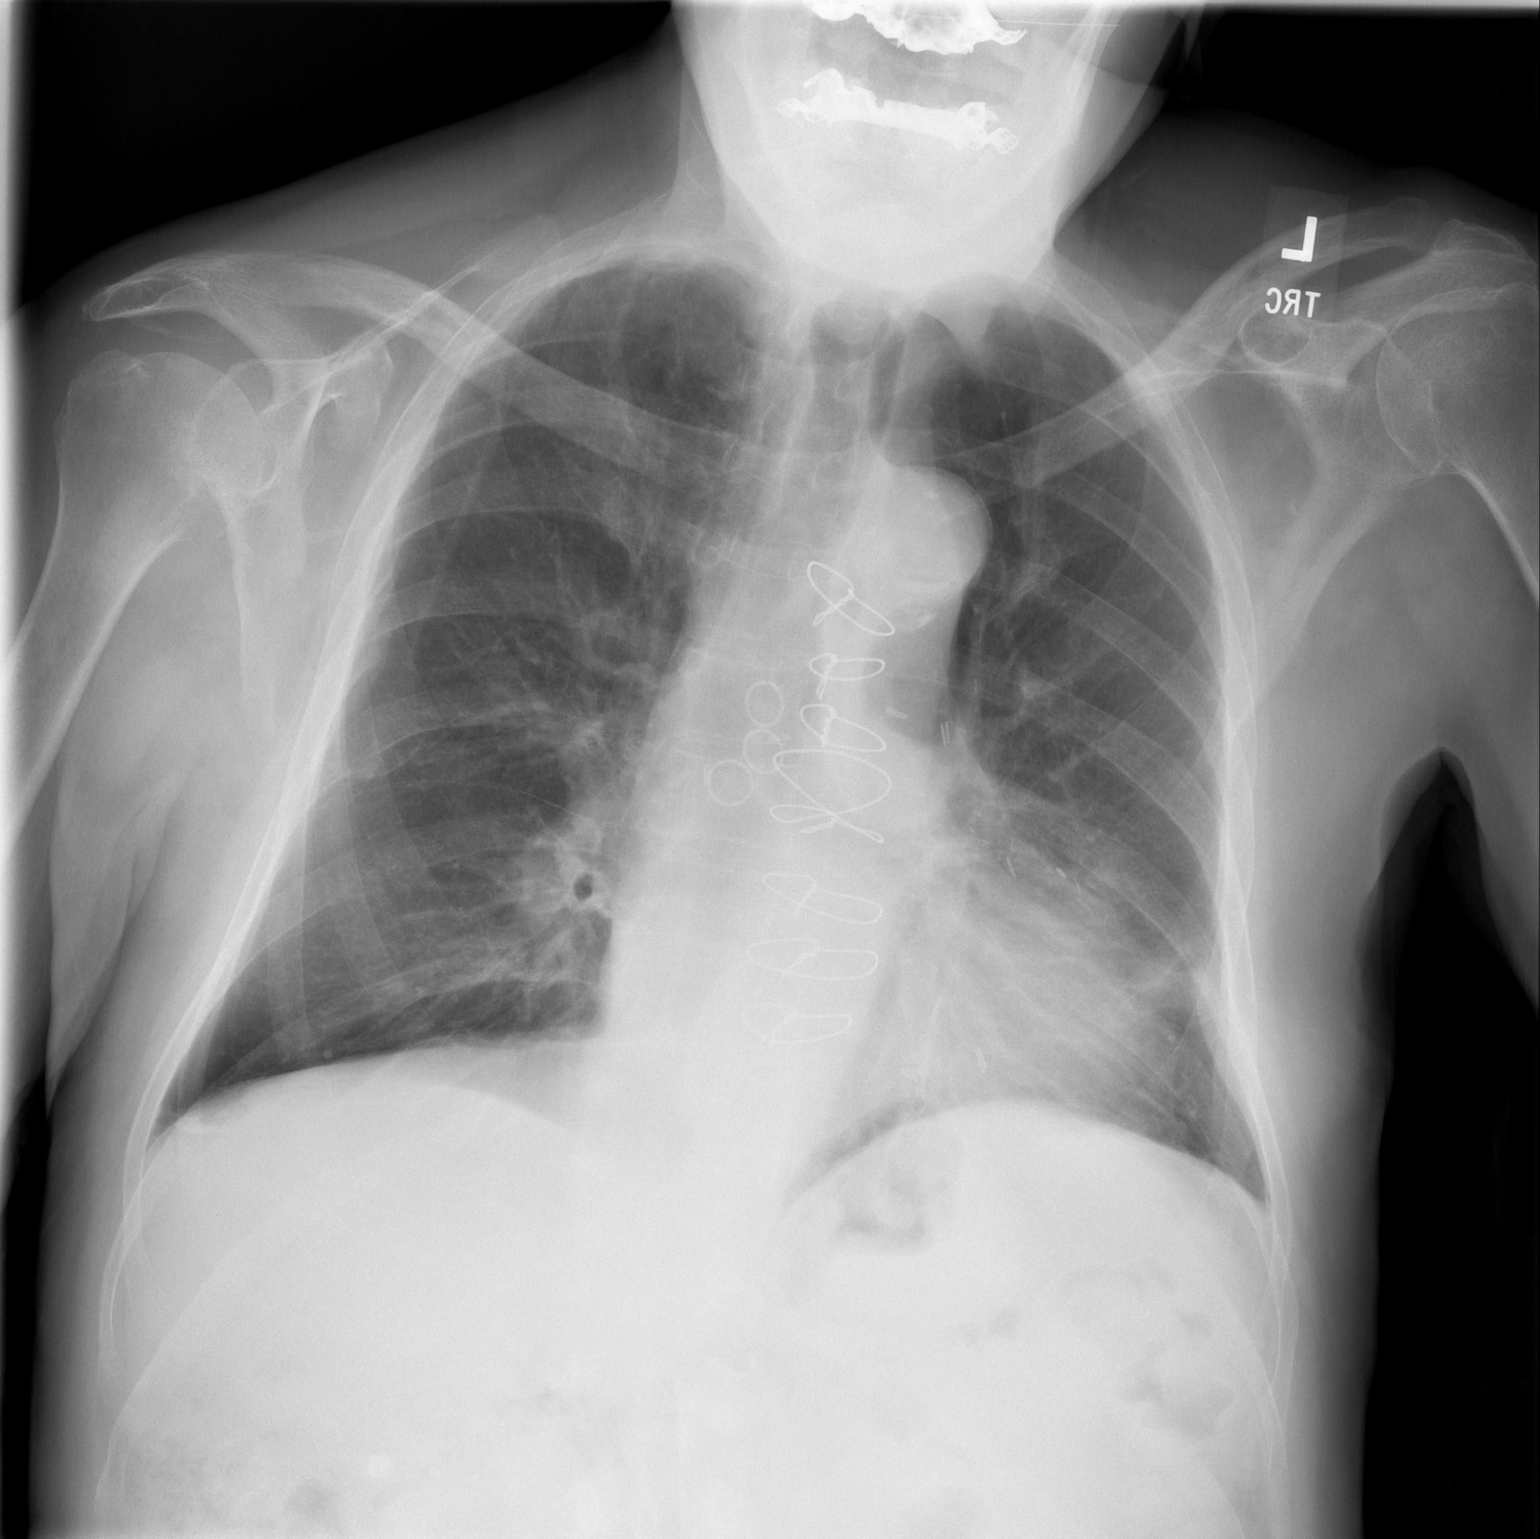

[w chest lat]
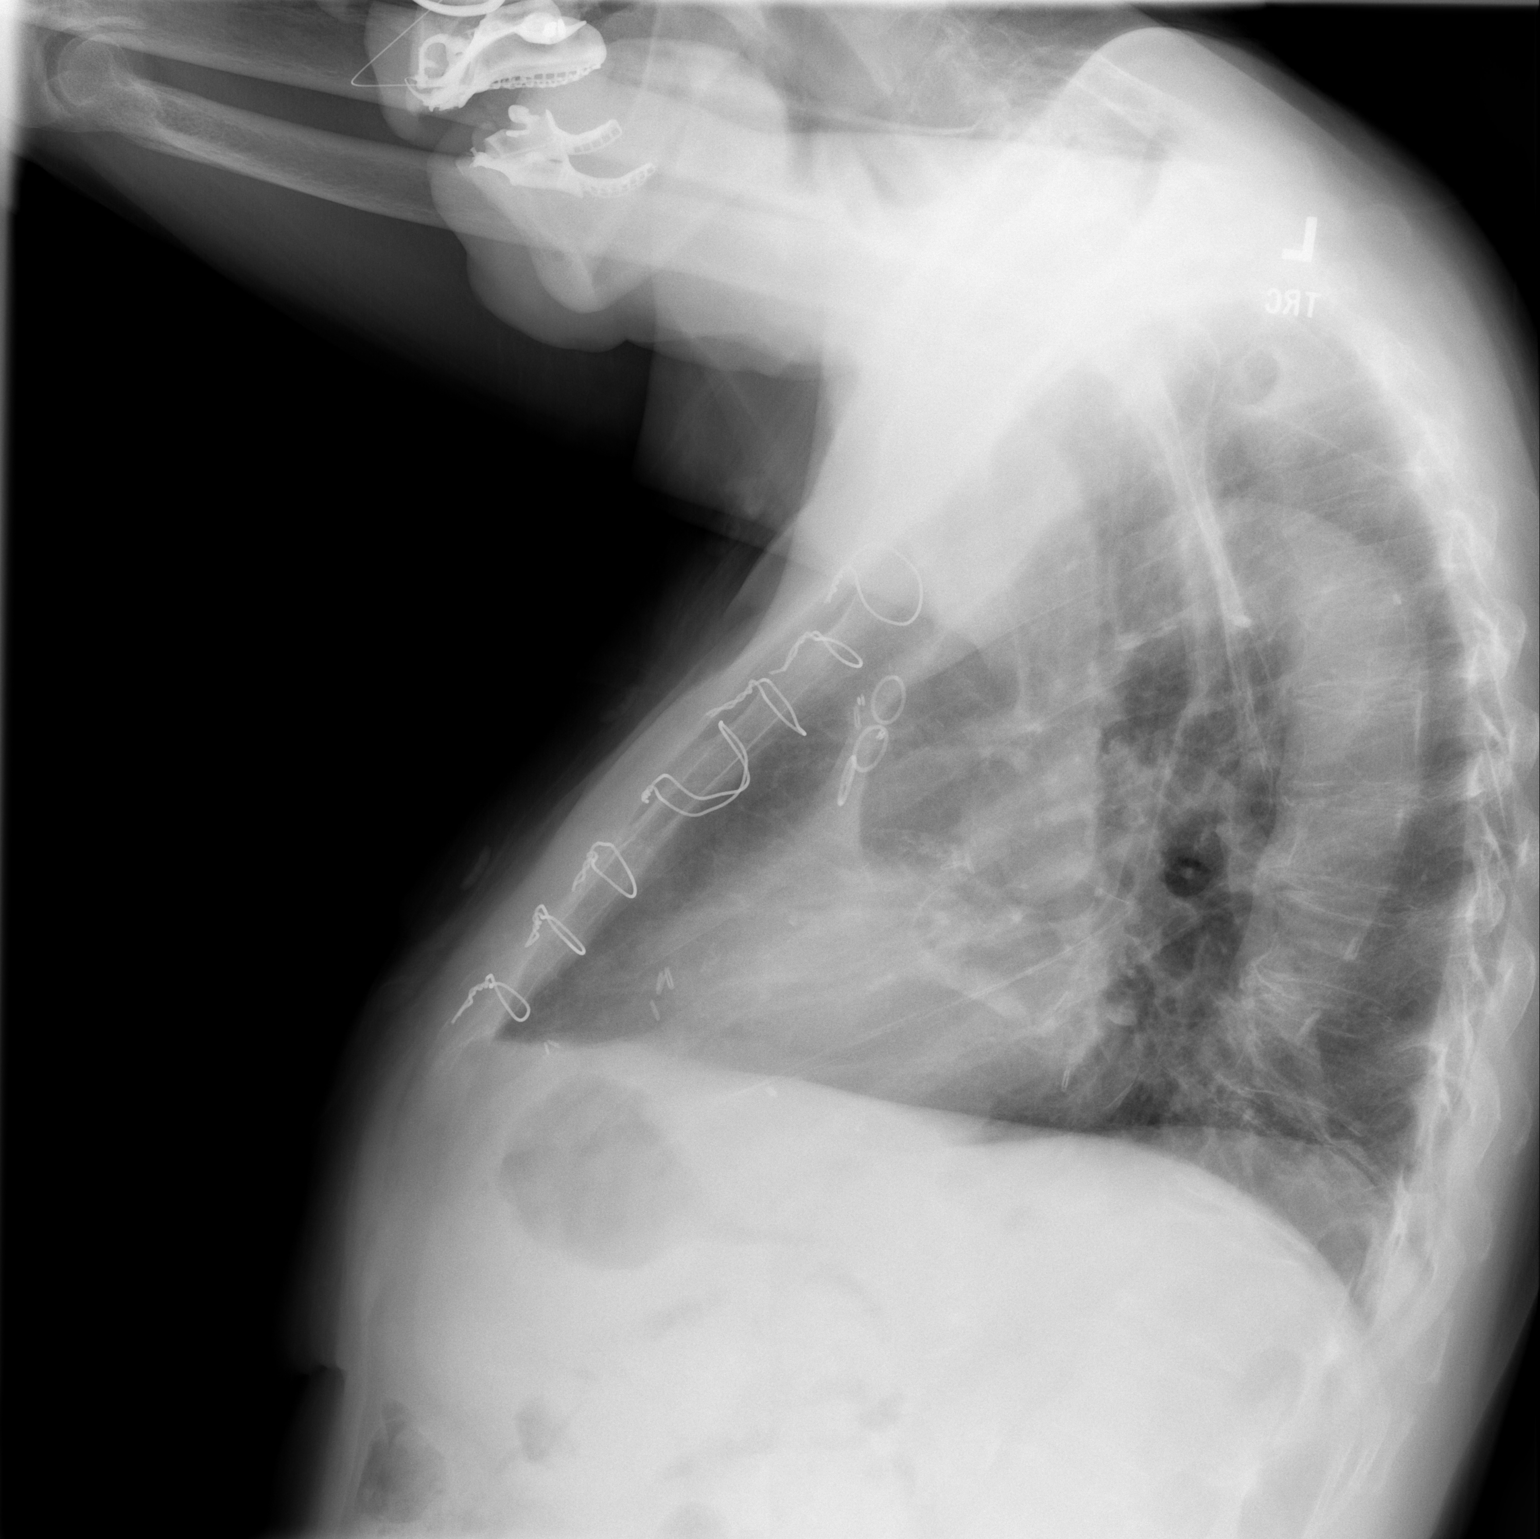

[2 of 2 positions shown; findings below may reference images not displayed]

FINDINGS: Cardiac and mediastinal contours are within normal limits status
post median sternotomy and CABG. Bilateral linear opacities are
overall decreased compared to prior exam and likely due to
atelectasis. No pleural effusion or pneumothorax.
IMPRESSION: Bilateral linear opacities are overall decreased compared to prior
exam and likely due to atelectasis. No acute abnormality.

## 2021-10-04 NOTE — Progress Notes (Signed)
      [address]       [place] [address]             [id number]        Robert Osborne [employer] Medical Record #[id number] Date of Birth: 11/02/1946  Referring: Jettie Booze, MD Primary Care: System, Provider Not In Primary Cardiologist:James Hochrein, MD  Reason for visit:   follow-up  History of Present Illness:     Mr. Teschner comes in for his 1 month follow-up appointment.  Overall he is doing well.  He has no complaints.  He only complains of sinus pain.  Physical Exam: BP 120/69   Pulse 72   Resp 20   Ht 5\' 7"  (1.702 m)   Wt 145 lb (65.8 kg)   SpO2 95% Comment: RA  BMI 22.71 kg/m   Alert NAD Incision clean.  Sternum stable Abdomen soft, ND No peripheral edema   Diagnostic Studies & Laboratory data: CXR: Clear     Assessment / Plan:   75 year old male status post CABG.  Currently doing well. Cleared for cardiac rehab. Follow-up as needed.   Lajuana Matte 10/04/2021 5:46 PM

## 2021-10-07 ENCOUNTER — Other Ambulatory Visit: Payer: Self-pay

## 2021-10-07 NOTE — Progress Notes (Signed)
Cardiac Rehab referral

## 2021-10-10 ENCOUNTER — Encounter: Payer: Self-pay | Admitting: Physician Assistant

## 2021-10-10 ENCOUNTER — Other Ambulatory Visit: Payer: Self-pay

## 2021-10-10 ENCOUNTER — Ambulatory Visit: Payer: Medicare HMO | Admitting: Physician Assistant

## 2021-10-10 VITALS — BP 119/78 | HR 73 | Ht 67.0 in | Wt 149.4 lb

## 2021-10-10 DIAGNOSIS — Z794 Long term (current) use of insulin: Secondary | ICD-10-CM

## 2021-10-10 DIAGNOSIS — E119 Type 2 diabetes mellitus without complications: Secondary | ICD-10-CM

## 2021-10-10 DIAGNOSIS — E785 Hyperlipidemia, unspecified: Secondary | ICD-10-CM

## 2021-10-10 DIAGNOSIS — I10 Essential (primary) hypertension: Secondary | ICD-10-CM

## 2021-10-10 DIAGNOSIS — I25810 Atherosclerosis of coronary artery bypass graft(s) without angina pectoris: Secondary | ICD-10-CM | POA: Diagnosis not present

## 2021-10-10 LAB — HEPATIC FUNCTION PANEL
ALT: 18 IU/L (ref 0–44)
AST: 24 IU/L (ref 0–40)
Albumin: 3.9 g/dL (ref 3.7–4.7)
Alkaline Phosphatase: 126 IU/L — ABNORMAL HIGH (ref 44–121)
Bilirubin Total: 0.4 mg/dL (ref 0.0–1.2)
Bilirubin, Direct: 0.12 mg/dL (ref 0.00–0.40)
Total Protein: 7 g/dL (ref 6.0–8.5)

## 2021-10-10 LAB — LIPID PANEL
Chol/HDL Ratio: 3.5 ratio (ref 0.0–5.0)
Cholesterol, Total: 135 mg/dL (ref 100–199)
HDL: 39 mg/dL — ABNORMAL LOW (ref >39)
LDL Chol Calc (NIH): 77 mg/dL (ref 0–99)
Triglycerides: 99 mg/dL (ref 0–149)
VLDL Cholesterol Cal: 19 mg/dL (ref 5–40)

## 2021-10-10 NOTE — Patient Instructions (Addendum)
Medication Instructions:  Your physician recommends that you continue on your current medications as directed. Please refer to the Current Medication list given to you today.  *If you need a refill on your cardiac medications before your next appointment, please call your pharmacy*  Lab Work: Fasting Lipid Panel Hepatic Function Test  If you have labs (blood work) drawn today and your tests are completely normal, you will receive your results only by: MyChart Message (if you have MyChart) OR A paper copy in the mail If you have any lab test that is abnormal or we need to change your treatment, we will call you to review the results.  Testing/Procedures: NONE ordered at this time of appointment   Follow-Up: At [facility], you and your health needs are our priority.  As part of our continuing mission to provide you with exceptional heart care, we have created designated Provider Care Teams.  These Care Teams include your primary Cardiologist (physician) and Advanced Practice Providers (APPs -  Physician Assistants and Nurse Practitioners) who all work together to provide you with the care you need, when you need it.  We recommend signing up for the patient portal called "MyChart".  Sign up information is provided on this After Visit Summary.  MyChart is used to connect with patients for Virtual Visits (Telemedicine).  Patients are able to view lab/test results, encounter notes, upcoming appointments, etc.  Non-urgent messages can be sent to your provider as well.   To learn more about what you can do with MyChart, go to [email or web address].    Your next appointment:   As scheduled    The format for your next appointment:   In Person  Provider:   Minus Breeding, MD  Other Instructions

## 2021-10-10 NOTE — Progress Notes (Signed)
Cardiology Office Note:    Date:  10/12/2021   ID:  Robert Osborne, DOB March 10, 1946, MRN [id number]  PCP:  System, Provider Not In   [place] Providers Cardiologist:  Minus Breeding, MD     Referring MD: No ref. provider found   Chief Complaint  Patient presents with   Follow-up    Seen for Dr. Percival Spanish    History of Present Illness:    Robert Osborne is a 75 y.o. male with a hx of HTN, HLD, DM II and recently diagnosed CAD.  Patient had a low risk nuclear stress test at [facility] in May 2022.  He recently went to [facility] on 08/15/2021 for evaluation of chest pain.  Chest x-ray showing COPD with right apical scarring but no acute finding.  CTA negative for PE, but noted a 5 mm right upper lobe lung nodule with repeat noncontrast CT recommended in 3 to 6 months.  Echocardiogram obtained on 08/16/2021 showed EF 55%, moderate asymmetric LVH of basal anteroseptal segment, grade 1 DD, new wall motion abnormality suggestive of mid to distal LAD disease versus possible stress-induced cardiomyopathy, trivial MR. Cardiac catheterization performed on 08/16/2021 showed EF EF 45 to 50%, 75% ostial left circumflex, 80% OM 2, 90% OM1, 25% ostial LAD, 90% D1, 90% mid LAD, 100% distal LAD occlusion, 90% RPAV, 25% mid RCA.  Culprit lesion was felt to be occlusion in the mid LAD territory, however he has heavily calcified lesion in the other vessels as well.  The lesion was successfully wired and flow restored, however a balloon could not be advanced through the heavily calcified disease more proximally.  CT surgery was consulted for consideration of CABG.  Patient ultimately underwent CABG x4 with LIMA-diagonal, reverse SVG-LAD, reverse SVG-PDA and reverse SVG-OM by Dr. Kipp Brood on 08/22/2021.  He also required endarterectomy in the operating room of his LAD.  Postprocedure, he was started on aspirin and Plavix.  Unfortunately, after bypass surgery, cardiology service got called again on 08/23/2021 for  midsternal chest pain.  Repeat EKG showed stable ST elevation in V2-V4.  Repeat echocardiogram obtained on 08/23/2021 showed EF 50 to 55%, apical akinesis, moderately reduced RV EF, free wall hypokinesis.  Echocardiogram was reviewed by Dr. Martinique who felt wall motion abnormality is unchanged when compared to the previous echo on 9/2, in reviewing the cardiac cath film, LAD was diffusely diseased and calcified, therefore he did not believe there was anything that we could offer percutaneously even if the vessel/graft went down, medical management was recommended.  I last saw the patient on 01/10/2021, at which time he was still having intermittent chest discomfort, I increase his Imdur to 60 mg daily.  He has completed the course of Lasix however he was still taking potassium supplement at the time.  We got a repeat basic metabolic panel, his potassium was high at 6.1.  I gave the patient 2 doses of Lokelma and instructed him to stop the potassium supplement.  Repeat blood work obtained in early October shows normalization of the potassium level.  Renal function stable.  Patient presents today for follow-up.  He denies any chest pain.  He ambulated around with a walker.  He has no lower extremity edema, orthopnea or PND.  His legs are still weak.  ABI prior to bypass surgery was normal.  I encouraged him to increase ambulation.  Renal function and electrolyte were stable on the last lab work.  He is due for fasting lipid panel and LFT  today.  He has a scheduled follow-up with Dr. Percival Spanish in January.   Past Medical History:  Diagnosis Date   Bladder cancer ([place]) 08/18/2013   Cancer ([place])    papillary urethral carcinoma,low grade, non-invasive   Diabetes mellitus without complication ([place])    Erectile dysfunction 10/24/2016   Hyperlipidemia associated with type 2 diabetes mellitus ([place]) 05/17/2013   Hypertension associated with diabetes ([place])    Hypokalemia 11/27/2017   Hypomagnesemia 11/27/2017   Major  frontotemporal neurocognitive disorder, probable, with behavioral disturbance 11/27/2017   Vitamin D deficiency 05/17/2013    Past Surgical History:  Procedure Laterality Date   COLONOSCOPY W/ POLYPECTOMY     CORONARY ARTERY BYPASS GRAFT N/A 08/22/2021   Procedure: CORONARY ARTERY BYPASS GRAFTING (CABG), ON PUMP, TIMES FOUR , USING LEFT INTERNAL MAMMARY ARTERY AND ENDOSCOPICALLY HARVESTED RIGHT GREATER SAPHENOUS VEIN;  Surgeon: Lajuana Matte, MD;  Location: [place];  Service: Open Heart Surgery;  Laterality: N/A;   CORONARY BALLOON ANGIOPLASTY N/A 08/16/2021   Procedure: CORONARY BALLOON ANGIOPLASTY;  Surgeon: Jettie Booze, MD;  Location: [place] [facility];  Service: Cardiovascular;  Laterality: N/A;   ENDOVEIN HARVEST OF GREATER SAPHENOUS VEIN  08/22/2021   Procedure: ENDOVEIN HARVEST OF GREATER SAPHENOUS VEIN;  Surgeon: Lajuana Matte, MD;  Location: [place];  Service: Open Heart Surgery;;   LEFT HEART CATH AND CORONARY ANGIOGRAPHY N/A 08/16/2021   Procedure: LEFT HEART CATH AND CORONARY ANGIOGRAPHY;  Surgeon: Jettie Booze, MD;  Location: [place] [facility];  Service: Cardiovascular;  Laterality: N/A;   TEE WITHOUT CARDIOVERSION N/A 08/22/2021   Procedure: TRANSESOPHAGEAL ECHOCARDIOGRAM (TEE);  Surgeon: Lajuana Matte, MD;  Location: [place];  Service: Open Heart Surgery;  Laterality: N/A;    Current Medications: Current Meds  Medication Sig   aspirin EC 81 MG EC tablet Take 1 tablet (81 mg total) by mouth daily. Swallow whole.   blood glucose meter kit and supplies KIT Dispense based on patient and insurance preference. Use up to twice a daily as directed. (FOR ICD-10 - type 2 DM uncontrolled E11.9)   blood glucose meter kit and supplies Dispense based on patient and insurance preference. Use up to four times daily as directed. (FOR ICD-10 E10.9, E11.9).   blood glucose meter kit and supplies Use up to four times daily as directed. dx E11.9   Blood Glucose Monitoring  Suppl (ONE TOUCH ULTRA 2) w/Device KIT USE TO CHECK BLOOD SUGAR UP TO 4 TIMES DAILY AS DIRECTED   clopidogrel (PLAVIX) 75 MG tablet Take 1 tablet (75 mg total) by mouth daily.   dapagliflozin propanediol (FARXIGA) 10 MG TABS tablet Take 1 tablet (10 mg total) by mouth daily.   glucose blood test strip Use as instructed   insulin aspart (NOVOLOG) 100 UNIT/ML FlexPen Inject 9 Units into the skin 2 (two) times daily with a meal.   insulin degludec (TRESIBA FLEXTOUCH) 100 UNIT/ML SOPN FlexTouch Pen Inject 0.05 mLs (5 Units total) into the skin daily. (Patient taking differently: Inject 18 Units into the skin daily.)   Insulin Pen Needle (PEN NEEDLES) 32G X 4 MM MISC 100 each by Does not apply route 4 (four) times daily. E10.9   isosorbide mononitrate (IMDUR) 60 MG 24 hr tablet Take 1 tablet (60 mg total) by mouth daily.   Lancets (ONETOUCH DELICA PLUS [id number]) MISC USE TO CHECK BLOOD SUGAR UP TO 4 TIMES A DAY AS DIRECTED   metoprolol tartrate (LOPRESSOR) 50 MG tablet Take 1 tablet (50 mg  total) by mouth 2 (two) times daily.   nitroGLYCERIN (NITROSTAT) 0.4 MG SL tablet Place 0.4 mg under the tongue every 5 (five) minutes as needed for chest pain.   OLANZapine (ZYPREXA) 10 MG tablet Take 10 mg by mouth at bedtime.   oxyCODONE (OXY IR/ROXICODONE) 5 MG immediate release tablet Take 1-2 tablets (5-10 mg total) by mouth every 4 (four) hours as needed for severe pain.   rosuvastatin (CRESTOR) 40 MG tablet Take 1 tablet (40 mg total) by mouth daily.   tamsulosin (FLOMAX) 0.4 MG CAPS capsule Take 0.4 mg by mouth daily.   [DISCONTINUED] isosorbide mononitrate (IMDUR) 30 MG 24 hr tablet Take 1 tablet (30 mg total) by mouth daily.     Allergies:   Enalapril   Social History   Socioeconomic History   Marital status: Married    Spouse name: Not on file   Number of children: 3   Years of education: Not on file   Highest education level: Not on file  Occupational History   Occupation: landscaping     Comment: retired, works part time  Tobacco Use   Smoking status: Former    Types: Cigarettes    Quit date: 05/17/1973    Years since quitting: 48.4   Smokeless tobacco: Never  Vaping Use   Vaping Use: Never used  Substance and Sexual Activity   Alcohol use: No   Drug use: No   Sexual activity: Not on file  Other Topics Concern   Not on file  Social History Narrative   Still works as Administrator.      Social Determinants of Health   Financial Resource Strain: Not on file  Food Insecurity: Not on file  Transportation Needs: Not on file  Physical Activity: Not on file  Stress: Not on file  Social Connections: Not on file     Family History: The patient's family history includes Heart attack in his maternal grandmother; Heart attack (age of onset: 68) in his mother; Stroke in his maternal grandfather.  ROS:   Please see the history of present illness.     All other systems reviewed and are negative.  EKGs/Labs/Other Studies Reviewed:    The following studies were reviewed today:  Echo 08/23/2021 1. Left ventricular ejection fraction, by estimation, is 50 to 55%. The  left ventricle has low normal function. The left ventricle demonstrates  regional wall motion abnormalities (see scoring diagram/findings for  description). Apical akinesis. There is  moderate left ventricular hypertrophy. Left ventricular diastolic function  could not be evaluated.   2. Right ventricular systolic function is moderately reduced. Free wall  hypokinesis. The right ventricular size is mildly enlarged.   3. The inferior vena cava is normal in size with greater than 50%  respiratory variability, suggesting right atrial pressure of 3 mmHg.   Conclusion(s)/Recommendation(s): Compared to prior echo, there is new RV  systolic dysfunction, with free wall hypokinesis. Consider evaluation for  PE.   EKG:  EKG is not ordered today.   Recent Labs: 08/15/2021: B Natriuretic Peptide 31.0 08/23/2021:  Magnesium 2.3 09/10/2021: Hemoglobin 13.9; Platelets 438 09/17/2021: BUN 17; Creatinine, Ser 1.44; Potassium 4.0; Sodium 147 10/10/2021: ALT 18  Recent Lipid Panel    Component Value Date/Time   CHOL 135 10/10/2021 0847   CHOL 125 05/17/2013 1513   TRIG 99 10/10/2021 0847   TRIG 139 02/21/2014 1632   TRIG 125 05/17/2013 1513   HDL 39 (L) 10/10/2021 0847   HDL 47 02/21/2014 1632  HDL 39 (L) 05/17/2013 1513   CHOLHDL 3.5 10/10/2021 0847   CHOLHDL 5.3 08/16/2021 0614   VLDL 18 08/16/2021 0614   LDLCALC 77 10/10/2021 0847   LDLCALC 61 02/21/2014 1632   LDLCALC 61 05/17/2013 1513     Risk Assessment/Calculations:           Physical Exam:    VS:  BP 119/78   Pulse 73   Ht $R'5\' 7"'Zx$  (1.702 m)   Wt 149 lb 6.4 oz (67.8 kg)   SpO2 97%   BMI 23.40 kg/m     Wt Readings from Last 3 Encounters:  10/10/21 149 lb 6.4 oz (67.8 kg)  10/04/21 145 lb (65.8 kg)  09/10/21 143 lb 12.8 oz (65.2 kg)     GEN:  Well nourished, well developed in no acute distress HEENT: Normal NECK: No JVD; No carotid bruits LYMPHATICS: No lymphadenopathy CARDIAC: RRR, no murmurs, rubs, gallops RESPIRATORY:  Clear to auscultation without rales, wheezing or rhonchi  ABDOMEN: Soft, non-tender, non-distended MUSCULOSKELETAL:  No edema; No deformity  SKIN: Warm and dry NEUROLOGIC:  Alert and oriented x 3 PSYCHIATRIC:  Normal affect   ASSESSMENT:    1. Coronary artery disease involving coronary bypass graft of native heart without angina pectoris   2. Primary hypertension   3. Hyperlipidemia LDL goal <70   4. Controlled type 2 diabetes mellitus without complication, with long-term current use of insulin (HCC)    PLAN:    In order of problems listed above:  CAD s/p CABG: Patient denies any further chest pain.  Continue aspirin and Plavix.  Hypertension: Blood pressure stable  Hyperlipidemia: Continue Crestor.  Obtain fasting lipid panel and LFT today  DM2: Managed by primary care provider.         Medication Adjustments/Labs and Tests Ordered: Current medicines are reviewed at length with the patient today.  Concerns regarding medicines are outlined above.  No orders of the defined types were placed in this encounter.  No orders of the defined types were placed in this encounter.   Patient Instructions  Medication Instructions:  Your physician recommends that you continue on your current medications as directed. Please refer to the Current Medication list given to you today.  *If you need a refill on your cardiac medications before your next appointment, please call your pharmacy*  Lab Work: Fasting Lipid Panel Hepatic Function Test  If you have labs (blood work) drawn today and your tests are completely normal, you will receive your results only by: MyChart Message (if you have MyChart) OR A paper copy in the mail If you have any lab test that is abnormal or we need to change your treatment, we will call you to review the results.  Testing/Procedures: NONE ordered at this time of appointment   Follow-Up: At [facility], you and your health needs are our priority.  As part of our continuing mission to provide you with exceptional heart care, we have created designated Provider Care Teams.  These Care Teams include your primary Cardiologist (physician) and Advanced Practice Providers (APPs -  Physician Assistants and Nurse Practitioners) who all work together to provide you with the care you need, when you need it.  We recommend signing up for the patient portal called "MyChart".  Sign up information is provided on this After Visit Summary.  MyChart is used to connect with patients for Virtual Visits (Telemedicine).  Patients are able to view lab/test results, encounter notes, upcoming appointments, etc.  Non-urgent messages can be  sent to your provider as well.   To learn more about what you can do with MyChart, go to [email or web address].    Your next appointment:    As scheduled    The format for your next appointment:   In Person  Provider:   Minus Breeding, MD  Other Instructions    Signed, Almyra Deforest, [place]  10/12/2021 9:31 PM    [place]

## 2021-10-11 DIAGNOSIS — E559 Vitamin D deficiency, unspecified: Secondary | ICD-10-CM | POA: Diagnosis not present

## 2021-10-11 DIAGNOSIS — Z794 Long term (current) use of insulin: Secondary | ICD-10-CM | POA: Diagnosis not present

## 2021-10-11 DIAGNOSIS — N400 Enlarged prostate without lower urinary tract symptoms: Secondary | ICD-10-CM | POA: Diagnosis not present

## 2021-10-11 DIAGNOSIS — E1165 Type 2 diabetes mellitus with hyperglycemia: Secondary | ICD-10-CM | POA: Diagnosis not present

## 2021-10-11 DIAGNOSIS — R911 Solitary pulmonary nodule: Secondary | ICD-10-CM | POA: Diagnosis not present

## 2021-10-11 DIAGNOSIS — Z48812 Encounter for surgical aftercare following surgery on the circulatory system: Secondary | ICD-10-CM | POA: Diagnosis not present

## 2021-10-11 DIAGNOSIS — Z7982 Long term (current) use of aspirin: Secondary | ICD-10-CM | POA: Diagnosis not present

## 2021-10-11 DIAGNOSIS — Z8551 Personal history of malignant neoplasm of bladder: Secondary | ICD-10-CM | POA: Diagnosis not present

## 2021-10-11 DIAGNOSIS — I2109 ST elevation (STEMI) myocardial infarction involving other coronary artery of anterior wall: Secondary | ICD-10-CM | POA: Diagnosis not present

## 2021-10-11 DIAGNOSIS — Z951 Presence of aortocoronary bypass graft: Secondary | ICD-10-CM | POA: Diagnosis not present

## 2021-10-11 DIAGNOSIS — E785 Hyperlipidemia, unspecified: Secondary | ICD-10-CM | POA: Diagnosis not present

## 2021-10-11 DIAGNOSIS — E441 Mild protein-calorie malnutrition: Secondary | ICD-10-CM | POA: Diagnosis not present

## 2021-10-11 DIAGNOSIS — E1159 Type 2 diabetes mellitus with other circulatory complications: Secondary | ICD-10-CM | POA: Diagnosis not present

## 2021-10-11 DIAGNOSIS — I152 Hypertension secondary to endocrine disorders: Secondary | ICD-10-CM | POA: Diagnosis not present

## 2021-10-11 DIAGNOSIS — Z7984 Long term (current) use of oral hypoglycemic drugs: Secondary | ICD-10-CM | POA: Diagnosis not present

## 2021-10-11 DIAGNOSIS — N179 Acute kidney failure, unspecified: Secondary | ICD-10-CM | POA: Diagnosis not present

## 2021-10-11 DIAGNOSIS — Z7902 Long term (current) use of antithrombotics/antiplatelets: Secondary | ICD-10-CM | POA: Diagnosis not present

## 2021-10-11 DIAGNOSIS — E1169 Type 2 diabetes mellitus with other specified complication: Secondary | ICD-10-CM | POA: Diagnosis not present

## 2021-10-12 ENCOUNTER — Encounter: Payer: Self-pay | Admitting: Physician Assistant

## 2021-10-14 DIAGNOSIS — Z7902 Long term (current) use of antithrombotics/antiplatelets: Secondary | ICD-10-CM | POA: Diagnosis not present

## 2021-10-14 DIAGNOSIS — E1159 Type 2 diabetes mellitus with other circulatory complications: Secondary | ICD-10-CM | POA: Diagnosis not present

## 2021-10-14 DIAGNOSIS — I2109 ST elevation (STEMI) myocardial infarction involving other coronary artery of anterior wall: Secondary | ICD-10-CM | POA: Diagnosis not present

## 2021-10-14 DIAGNOSIS — I152 Hypertension secondary to endocrine disorders: Secondary | ICD-10-CM | POA: Diagnosis not present

## 2021-10-14 DIAGNOSIS — R911 Solitary pulmonary nodule: Secondary | ICD-10-CM | POA: Diagnosis not present

## 2021-10-14 DIAGNOSIS — E1165 Type 2 diabetes mellitus with hyperglycemia: Secondary | ICD-10-CM | POA: Diagnosis not present

## 2021-10-14 DIAGNOSIS — N400 Enlarged prostate without lower urinary tract symptoms: Secondary | ICD-10-CM | POA: Diagnosis not present

## 2021-10-14 DIAGNOSIS — N179 Acute kidney failure, unspecified: Secondary | ICD-10-CM | POA: Diagnosis not present

## 2021-10-14 DIAGNOSIS — E1169 Type 2 diabetes mellitus with other specified complication: Secondary | ICD-10-CM | POA: Diagnosis not present

## 2021-10-14 DIAGNOSIS — Z794 Long term (current) use of insulin: Secondary | ICD-10-CM | POA: Diagnosis not present

## 2021-10-14 DIAGNOSIS — Z7984 Long term (current) use of oral hypoglycemic drugs: Secondary | ICD-10-CM | POA: Diagnosis not present

## 2021-10-14 DIAGNOSIS — Z48812 Encounter for surgical aftercare following surgery on the circulatory system: Secondary | ICD-10-CM | POA: Diagnosis not present

## 2021-10-14 DIAGNOSIS — E559 Vitamin D deficiency, unspecified: Secondary | ICD-10-CM | POA: Diagnosis not present

## 2021-10-14 DIAGNOSIS — E785 Hyperlipidemia, unspecified: Secondary | ICD-10-CM | POA: Diagnosis not present

## 2021-10-14 DIAGNOSIS — Z7982 Long term (current) use of aspirin: Secondary | ICD-10-CM | POA: Diagnosis not present

## 2021-10-14 DIAGNOSIS — E441 Mild protein-calorie malnutrition: Secondary | ICD-10-CM | POA: Diagnosis not present

## 2021-10-14 DIAGNOSIS — Z8551 Personal history of malignant neoplasm of bladder: Secondary | ICD-10-CM | POA: Diagnosis not present

## 2021-10-14 DIAGNOSIS — Z951 Presence of aortocoronary bypass graft: Secondary | ICD-10-CM | POA: Diagnosis not present

## 2021-10-17 ENCOUNTER — Other Ambulatory Visit: Payer: Self-pay

## 2021-10-17 ENCOUNTER — Telehealth (HOSPITAL_COMMUNITY): Payer: Self-pay

## 2021-10-17 DIAGNOSIS — Z7982 Long term (current) use of aspirin: Secondary | ICD-10-CM | POA: Diagnosis not present

## 2021-10-17 DIAGNOSIS — I152 Hypertension secondary to endocrine disorders: Secondary | ICD-10-CM | POA: Diagnosis not present

## 2021-10-17 DIAGNOSIS — E785 Hyperlipidemia, unspecified: Secondary | ICD-10-CM

## 2021-10-17 DIAGNOSIS — N179 Acute kidney failure, unspecified: Secondary | ICD-10-CM | POA: Diagnosis not present

## 2021-10-17 DIAGNOSIS — E1165 Type 2 diabetes mellitus with hyperglycemia: Secondary | ICD-10-CM | POA: Diagnosis not present

## 2021-10-17 DIAGNOSIS — I2109 ST elevation (STEMI) myocardial infarction involving other coronary artery of anterior wall: Secondary | ICD-10-CM | POA: Diagnosis not present

## 2021-10-17 DIAGNOSIS — R911 Solitary pulmonary nodule: Secondary | ICD-10-CM | POA: Diagnosis not present

## 2021-10-17 DIAGNOSIS — Z951 Presence of aortocoronary bypass graft: Secondary | ICD-10-CM | POA: Diagnosis not present

## 2021-10-17 DIAGNOSIS — E441 Mild protein-calorie malnutrition: Secondary | ICD-10-CM | POA: Diagnosis not present

## 2021-10-17 DIAGNOSIS — E559 Vitamin D deficiency, unspecified: Secondary | ICD-10-CM | POA: Diagnosis not present

## 2021-10-17 DIAGNOSIS — N400 Enlarged prostate without lower urinary tract symptoms: Secondary | ICD-10-CM | POA: Diagnosis not present

## 2021-10-17 DIAGNOSIS — Z8551 Personal history of malignant neoplasm of bladder: Secondary | ICD-10-CM | POA: Diagnosis not present

## 2021-10-17 DIAGNOSIS — E1169 Type 2 diabetes mellitus with other specified complication: Secondary | ICD-10-CM | POA: Diagnosis not present

## 2021-10-17 DIAGNOSIS — Z7984 Long term (current) use of oral hypoglycemic drugs: Secondary | ICD-10-CM | POA: Diagnosis not present

## 2021-10-17 DIAGNOSIS — E1159 Type 2 diabetes mellitus with other circulatory complications: Secondary | ICD-10-CM | POA: Diagnosis not present

## 2021-10-17 DIAGNOSIS — Z794 Long term (current) use of insulin: Secondary | ICD-10-CM | POA: Diagnosis not present

## 2021-10-17 DIAGNOSIS — Z7902 Long term (current) use of antithrombotics/antiplatelets: Secondary | ICD-10-CM | POA: Diagnosis not present

## 2021-10-17 DIAGNOSIS — Z48812 Encounter for surgical aftercare following surgery on the circulatory system: Secondary | ICD-10-CM | POA: Diagnosis not present

## 2021-10-17 MED ORDER — EZETIMIBE 10 MG PO TABS: 10.0000 mg | ORAL_TABLET | Freq: Every day | ORAL | 3 refills | Status: DC

## 2021-10-17 NOTE — Telephone Encounter (Signed)
Recv'ed office referral.   Called and spoke with pt wife Mariann Laster who stated that the drive from [facility] to [place] is too far.   Closed referral

## 2021-10-18 DIAGNOSIS — N179 Acute kidney failure, unspecified: Secondary | ICD-10-CM | POA: Diagnosis not present

## 2021-10-18 DIAGNOSIS — Z8551 Personal history of malignant neoplasm of bladder: Secondary | ICD-10-CM | POA: Diagnosis not present

## 2021-10-18 DIAGNOSIS — E1159 Type 2 diabetes mellitus with other circulatory complications: Secondary | ICD-10-CM | POA: Diagnosis not present

## 2021-10-18 DIAGNOSIS — I2109 ST elevation (STEMI) myocardial infarction involving other coronary artery of anterior wall: Secondary | ICD-10-CM | POA: Diagnosis not present

## 2021-10-18 DIAGNOSIS — E441 Mild protein-calorie malnutrition: Secondary | ICD-10-CM | POA: Diagnosis not present

## 2021-10-18 DIAGNOSIS — Z7982 Long term (current) use of aspirin: Secondary | ICD-10-CM | POA: Diagnosis not present

## 2021-10-18 DIAGNOSIS — E785 Hyperlipidemia, unspecified: Secondary | ICD-10-CM | POA: Diagnosis not present

## 2021-10-18 DIAGNOSIS — E559 Vitamin D deficiency, unspecified: Secondary | ICD-10-CM | POA: Diagnosis not present

## 2021-10-18 DIAGNOSIS — N400 Enlarged prostate without lower urinary tract symptoms: Secondary | ICD-10-CM | POA: Diagnosis not present

## 2021-10-18 DIAGNOSIS — E1169 Type 2 diabetes mellitus with other specified complication: Secondary | ICD-10-CM | POA: Diagnosis not present

## 2021-10-18 DIAGNOSIS — I152 Hypertension secondary to endocrine disorders: Secondary | ICD-10-CM | POA: Diagnosis not present

## 2021-10-18 DIAGNOSIS — R911 Solitary pulmonary nodule: Secondary | ICD-10-CM | POA: Diagnosis not present

## 2021-10-18 DIAGNOSIS — Z7984 Long term (current) use of oral hypoglycemic drugs: Secondary | ICD-10-CM | POA: Diagnosis not present

## 2021-10-18 DIAGNOSIS — Z951 Presence of aortocoronary bypass graft: Secondary | ICD-10-CM | POA: Diagnosis not present

## 2021-10-18 DIAGNOSIS — E1165 Type 2 diabetes mellitus with hyperglycemia: Secondary | ICD-10-CM | POA: Diagnosis not present

## 2021-10-18 DIAGNOSIS — Z7902 Long term (current) use of antithrombotics/antiplatelets: Secondary | ICD-10-CM | POA: Diagnosis not present

## 2021-10-18 DIAGNOSIS — Z794 Long term (current) use of insulin: Secondary | ICD-10-CM | POA: Diagnosis not present

## 2021-10-18 DIAGNOSIS — Z48812 Encounter for surgical aftercare following surgery on the circulatory system: Secondary | ICD-10-CM | POA: Diagnosis not present

## 2021-10-19 ENCOUNTER — Other Ambulatory Visit: Payer: Self-pay | Admitting: Physician Assistant

## 2021-10-20 DIAGNOSIS — I2109 ST elevation (STEMI) myocardial infarction involving other coronary artery of anterior wall: Secondary | ICD-10-CM | POA: Diagnosis not present

## 2021-10-20 DIAGNOSIS — Z7982 Long term (current) use of aspirin: Secondary | ICD-10-CM | POA: Diagnosis not present

## 2021-10-20 DIAGNOSIS — Z8551 Personal history of malignant neoplasm of bladder: Secondary | ICD-10-CM | POA: Diagnosis not present

## 2021-10-20 DIAGNOSIS — E1165 Type 2 diabetes mellitus with hyperglycemia: Secondary | ICD-10-CM | POA: Diagnosis not present

## 2021-10-20 DIAGNOSIS — N179 Acute kidney failure, unspecified: Secondary | ICD-10-CM | POA: Diagnosis not present

## 2021-10-20 DIAGNOSIS — R911 Solitary pulmonary nodule: Secondary | ICD-10-CM | POA: Diagnosis not present

## 2021-10-20 DIAGNOSIS — Z48812 Encounter for surgical aftercare following surgery on the circulatory system: Secondary | ICD-10-CM | POA: Diagnosis not present

## 2021-10-20 DIAGNOSIS — E1159 Type 2 diabetes mellitus with other circulatory complications: Secondary | ICD-10-CM | POA: Diagnosis not present

## 2021-10-20 DIAGNOSIS — E441 Mild protein-calorie malnutrition: Secondary | ICD-10-CM | POA: Diagnosis not present

## 2021-10-20 DIAGNOSIS — E785 Hyperlipidemia, unspecified: Secondary | ICD-10-CM | POA: Diagnosis not present

## 2021-10-20 DIAGNOSIS — Z7984 Long term (current) use of oral hypoglycemic drugs: Secondary | ICD-10-CM | POA: Diagnosis not present

## 2021-10-20 DIAGNOSIS — E559 Vitamin D deficiency, unspecified: Secondary | ICD-10-CM | POA: Diagnosis not present

## 2021-10-20 DIAGNOSIS — Z794 Long term (current) use of insulin: Secondary | ICD-10-CM | POA: Diagnosis not present

## 2021-10-20 DIAGNOSIS — N400 Enlarged prostate without lower urinary tract symptoms: Secondary | ICD-10-CM | POA: Diagnosis not present

## 2021-10-20 DIAGNOSIS — I152 Hypertension secondary to endocrine disorders: Secondary | ICD-10-CM | POA: Diagnosis not present

## 2021-10-20 DIAGNOSIS — Z951 Presence of aortocoronary bypass graft: Secondary | ICD-10-CM | POA: Diagnosis not present

## 2021-10-20 DIAGNOSIS — Z7902 Long term (current) use of antithrombotics/antiplatelets: Secondary | ICD-10-CM | POA: Diagnosis not present

## 2021-10-20 DIAGNOSIS — E1169 Type 2 diabetes mellitus with other specified complication: Secondary | ICD-10-CM | POA: Diagnosis not present

## 2021-10-21 ENCOUNTER — Emergency Department (HOSPITAL_COMMUNITY): Payer: Medicare HMO

## 2021-10-21 ENCOUNTER — Emergency Department (HOSPITAL_COMMUNITY)
Admission: EM | Admit: 2021-10-21 | Discharge: 2021-10-21 | Disposition: A | Discharge status: Home / Self Care | Payer: Medicare HMO | Attending: Emergency Medicine | Admitting: Emergency Medicine

## 2021-10-21 ENCOUNTER — Other Ambulatory Visit: Payer: Self-pay

## 2021-10-21 ENCOUNTER — Encounter (HOSPITAL_COMMUNITY): Payer: Self-pay

## 2021-10-21 DIAGNOSIS — W19XXXA Unspecified fall, initial encounter: Secondary | ICD-10-CM | POA: Diagnosis not present

## 2021-10-21 DIAGNOSIS — Z794 Long term (current) use of insulin: Secondary | ICD-10-CM | POA: Diagnosis not present

## 2021-10-21 DIAGNOSIS — Z87891 Personal history of nicotine dependence: Secondary | ICD-10-CM | POA: Diagnosis not present

## 2021-10-21 DIAGNOSIS — E119 Type 2 diabetes mellitus without complications: Secondary | ICD-10-CM | POA: Insufficient documentation

## 2021-10-21 DIAGNOSIS — Z7982 Long term (current) use of aspirin: Secondary | ICD-10-CM | POA: Diagnosis not present

## 2021-10-21 DIAGNOSIS — Z8551 Personal history of malignant neoplasm of bladder: Secondary | ICD-10-CM | POA: Insufficient documentation

## 2021-10-21 DIAGNOSIS — Z951 Presence of aortocoronary bypass graft: Secondary | ICD-10-CM | POA: Diagnosis not present

## 2021-10-21 DIAGNOSIS — R0781 Pleurodynia: Secondary | ICD-10-CM | POA: Insufficient documentation

## 2021-10-21 DIAGNOSIS — I517 Cardiomegaly: Secondary | ICD-10-CM | POA: Diagnosis not present

## 2021-10-21 DIAGNOSIS — R350 Frequency of micturition: Secondary | ICD-10-CM | POA: Insufficient documentation

## 2021-10-21 DIAGNOSIS — R42 Dizziness and giddiness: Secondary | ICD-10-CM | POA: Insufficient documentation

## 2021-10-21 DIAGNOSIS — Z7902 Long term (current) use of antithrombotics/antiplatelets: Secondary | ICD-10-CM | POA: Insufficient documentation

## 2021-10-21 DIAGNOSIS — J90 Pleural effusion, not elsewhere classified: Secondary | ICD-10-CM | POA: Diagnosis not present

## 2021-10-21 DIAGNOSIS — S2231XA Fracture of one rib, right side, initial encounter for closed fracture: Secondary | ICD-10-CM | POA: Diagnosis not present

## 2021-10-21 LAB — BASIC METABOLIC PANEL
Anion gap: 11 (ref 5–15)
BUN: 27 mg/dL — ABNORMAL HIGH (ref 8–23)
CO2: 23 mmol/L (ref 22–32)
Calcium: 9.1 mg/dL (ref 8.9–10.3)
Chloride: 102 mmol/L (ref 98–111)
Creatinine, Ser: 1.55 mg/dL — ABNORMAL HIGH (ref 0.61–1.24)
GFR, Estimated: 46 mL/min — ABNORMAL LOW (ref >60)
Glucose, Bld: 151 mg/dL — ABNORMAL HIGH (ref 70–99)
Potassium: 4.2 mmol/L (ref 3.5–5.1)
Sodium: 136 mmol/L (ref 135–145)

## 2021-10-21 LAB — CBC WITH DIFFERENTIAL/PLATELET
Abs Immature Granulocytes: 0.07 10*3/uL (ref 0.00–0.07)
Basophils Absolute: 0 10*3/uL (ref 0.0–0.1)
Basophils Relative: 0 %
Eosinophils Absolute: 0 10*3/uL (ref 0.0–0.5)
Eosinophils Relative: 0 %
HCT: 43.6 % (ref 39.0–52.0)
Hemoglobin: 13.6 g/dL (ref 13.0–17.0)
Immature Granulocytes: 1 %
Lymphocytes Relative: 6 %
Lymphs Abs: 0.7 10*3/uL (ref 0.7–4.0)
MCH: 25.9 pg — ABNORMAL LOW (ref 26.0–34.0)
MCHC: 31.2 g/dL (ref 30.0–36.0)
MCV: 82.9 fL (ref 80.0–100.0)
Monocytes Absolute: 1.2 10*3/uL — ABNORMAL HIGH (ref 0.1–1.0)
Monocytes Relative: 10 %
Neutro Abs: 10.3 10*3/uL — ABNORMAL HIGH (ref 1.7–7.7)
Neutrophils Relative %: 83 %
Platelets: 235 10*3/uL (ref 150–400)
RBC: 5.26 MIL/uL (ref 4.22–5.81)
RDW: 15.3 % (ref 11.5–15.5)
WBC: 12.3 10*3/uL — ABNORMAL HIGH (ref 4.0–10.5)
nRBC: 0 % (ref 0.0–0.2)

## 2021-10-21 LAB — URINALYSIS, ROUTINE W REFLEX MICROSCOPIC
Bilirubin Urine: NEGATIVE
Glucose, UA: 150 mg/dL — AB
Ketones, ur: 5 mg/dL — AB
Nitrite: NEGATIVE
Protein, ur: 100 mg/dL — AB
Specific Gravity, Urine: 1.03 (ref 1.005–1.030)
WBC, UA: >50 WBC/hpf — ABNORMAL HIGH (ref 0–5)
pH: 5 (ref 5.0–8.0)

## 2021-10-21 LAB — TROPONIN I (HIGH SENSITIVITY)
Troponin I (High Sensitivity): 3 ng/L (ref <18)
Troponin I (High Sensitivity): 2 ng/L (ref <18)

## 2021-10-21 IMAGING — DX DG CHEST 2V
2 series · 2 of 2 positions shown · non-contrast
Comparison: [DATE]

CLINICAL DATA: Right-sided rib pain

EXAM:
CHEST - 2 VIEW

[chest lat]
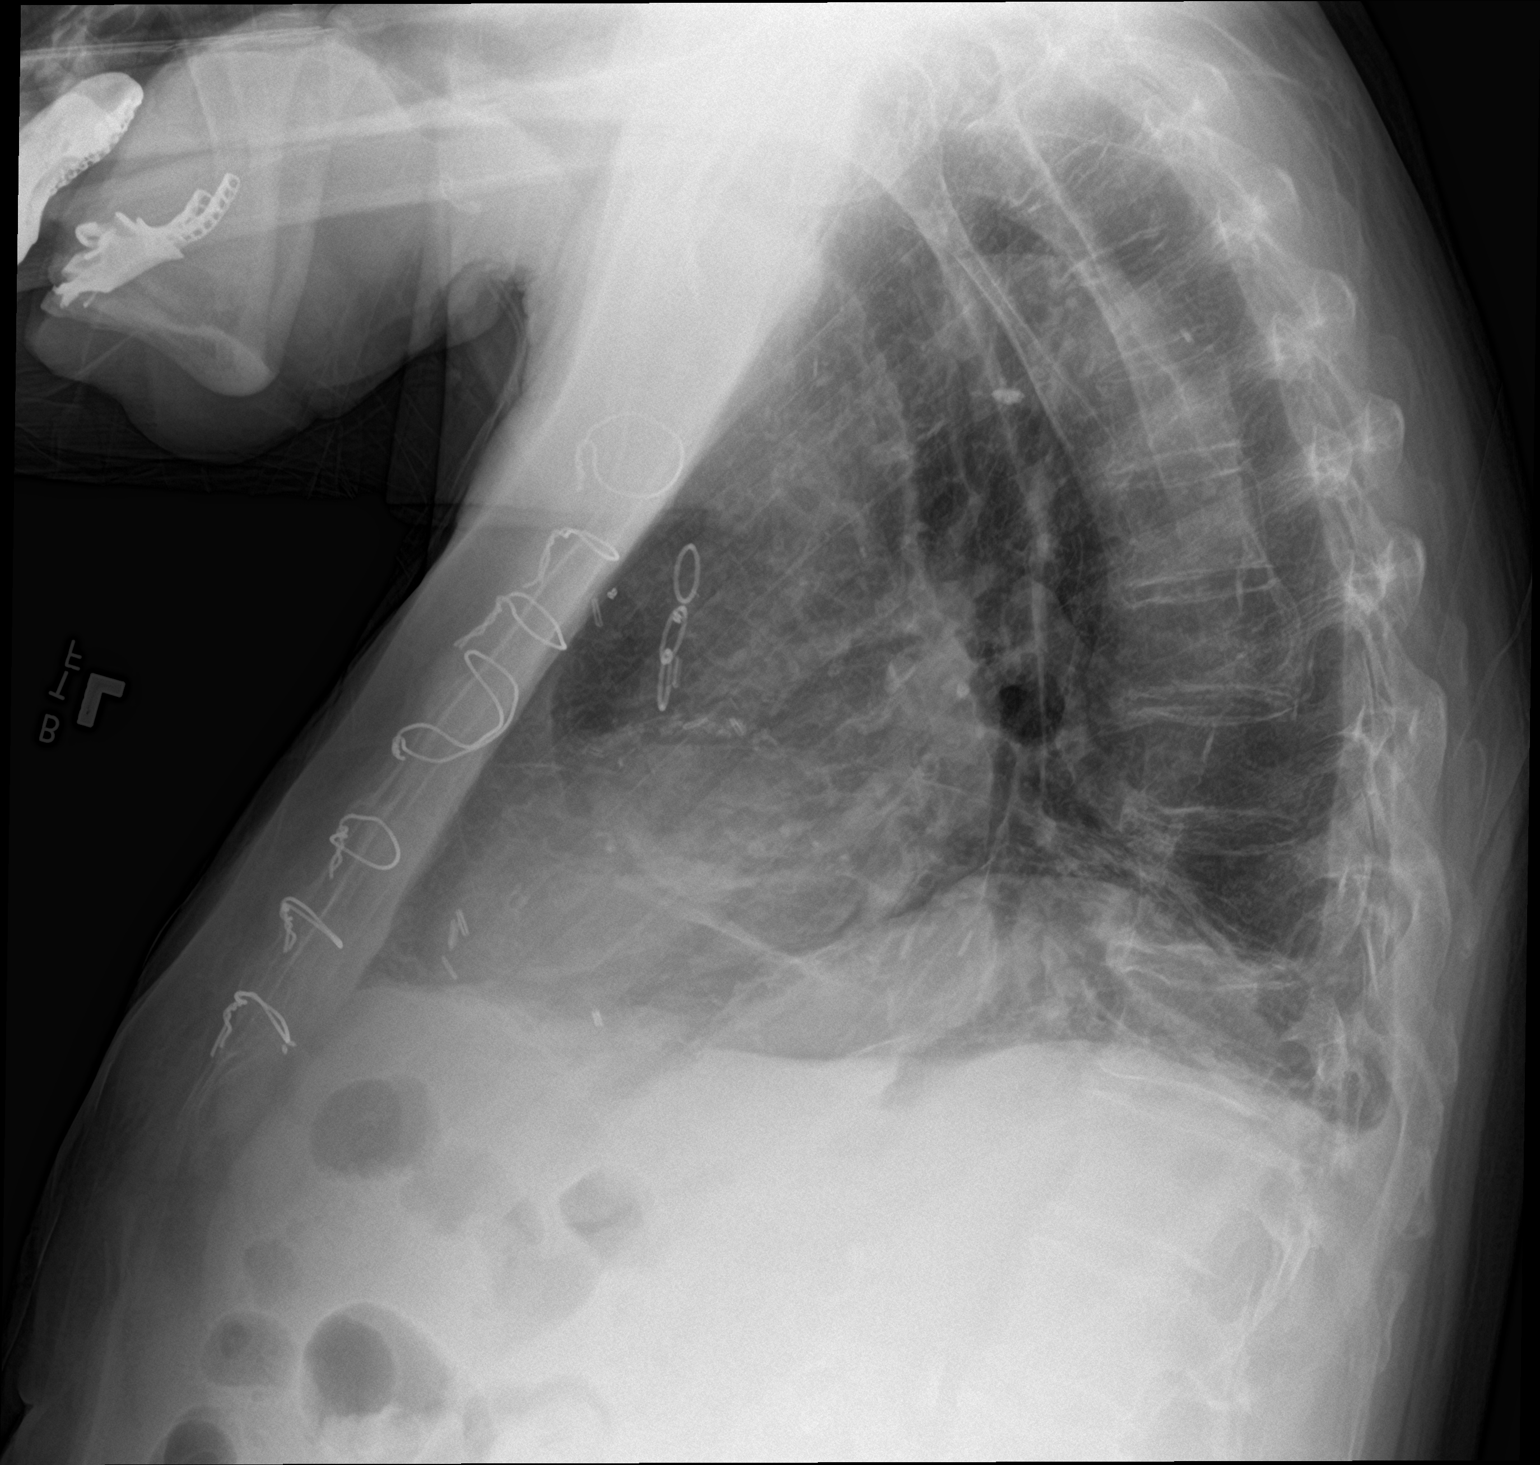

[chest ap]
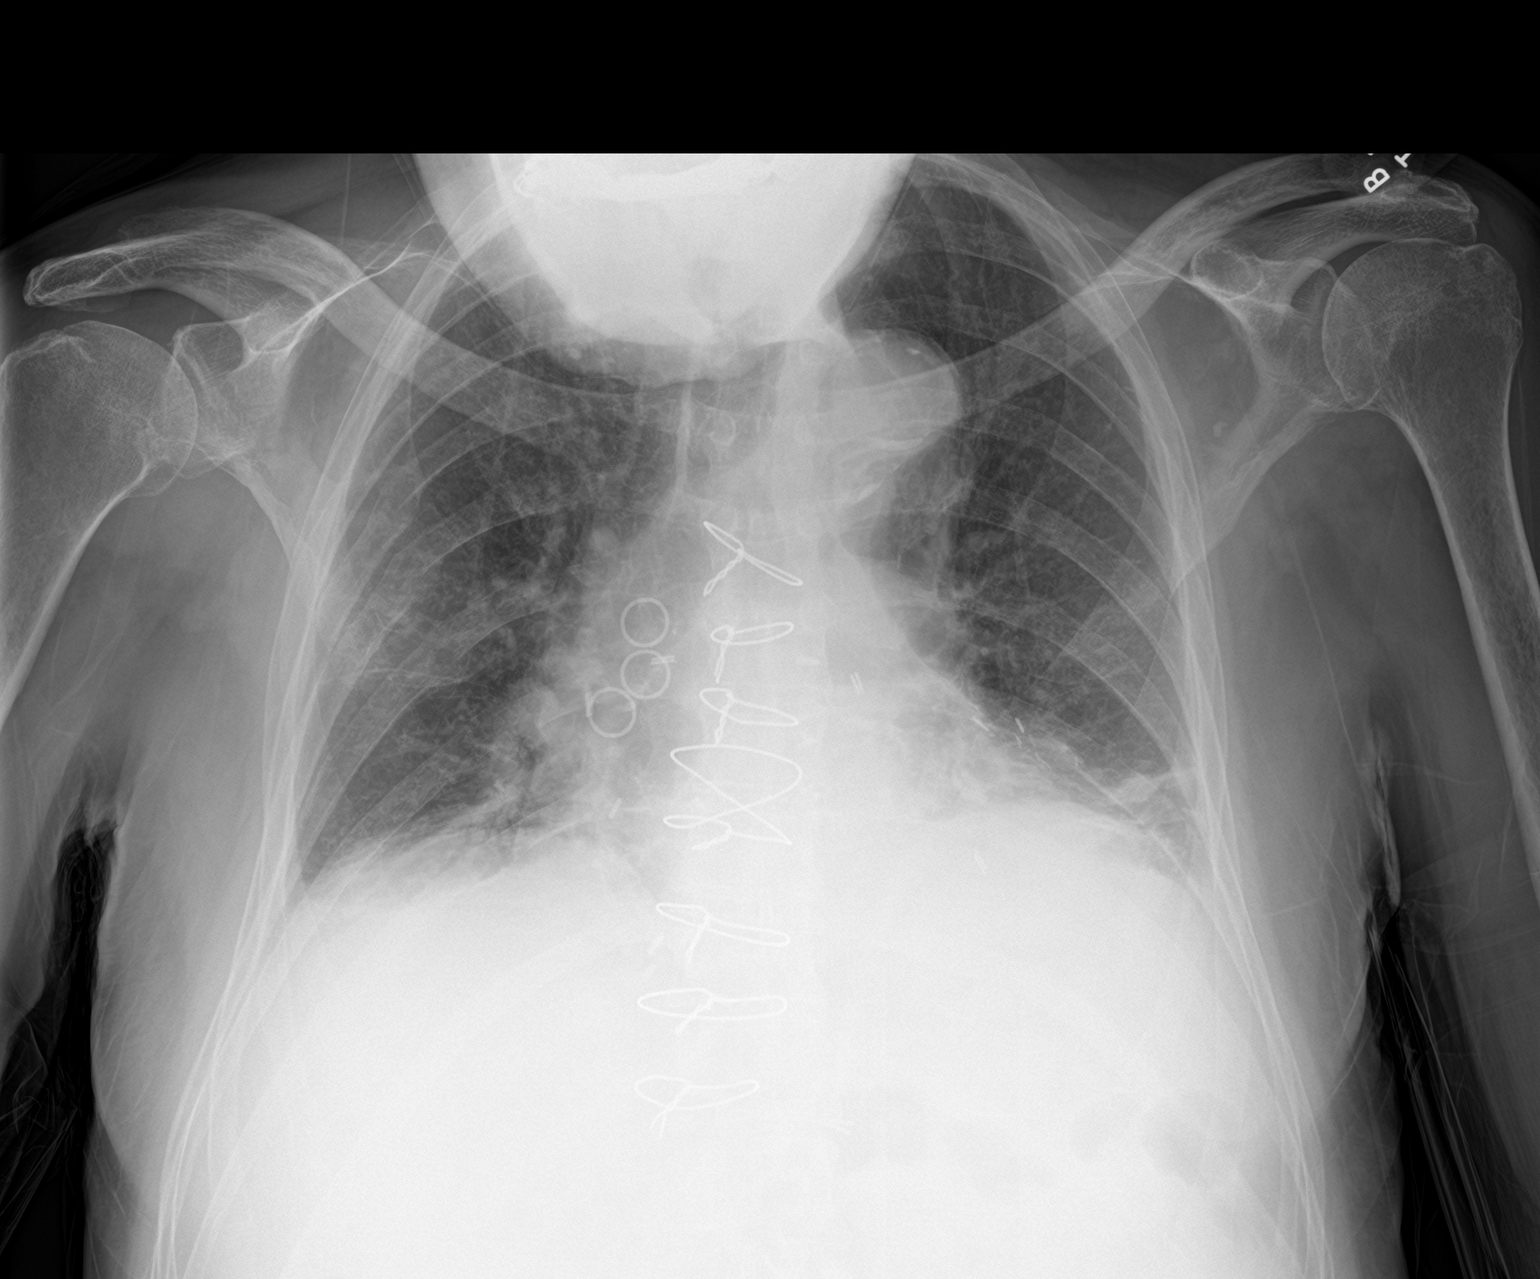

[2 of 2 positions shown; findings below may reference images not displayed]

FINDINGS: Post sternotomy changes. Small bilateral pleural effusions with
basilar airspace disease. Patchy right midlung airspace disease.
Mild cardiomegaly with vascular congestion. Aortic atherosclerosis.
No pneumothorax allowing for the patient's chin obscuring the
apices. Multiple chronic right rib fractures.
IMPRESSION: 1. Cardiomegaly with vascular congestion and small bilateral
effusions.
2. Bibasilar airspace disease with patchy right mid lung opacity,
atelectasis versus pneumonia

## 2021-10-21 MED ORDER — LEVOFLOXACIN 500 MG PO TABS
500.0000 mg | ORAL_TABLET | Freq: Once | ORAL | Status: AC
  Administered 2021-10-21: 500 mg via ORAL
  Filled 2021-10-21: qty 1

## 2021-10-21 MED ORDER — DOXYCYCLINE HYCLATE 100 MG PO TABS: 100.0000 mg | ORAL_TABLET | Freq: Once | ORAL | Status: DC

## 2021-10-21 MED ORDER — LEVOFLOXACIN 500 MG PO TABS: 500.0000 mg | ORAL_TABLET | Freq: Every day | ORAL | 0 refills | Status: DC

## 2021-10-21 NOTE — ED Provider Notes (Signed)
[facility] EMERGENCY DEPARTMENT Provider Note   CSN: [id number] Arrival date & time: 10/21/21  1237     History Chief Complaint  Patient presents with   Robert Osborne is a 75 y.o. male with a past medical history of diabetes, hyperlipidemia, who presents to the emergency department complaining of fall onset last night.  Patient notes that he slid and fell to the ground from bed last night and hit his right ribs.  He reports feeling lightheaded and sweaty prior to the fall.  Patient has associated right rib pain.  Patient has not tried any medications for his symptoms.  Patient denies hitting his head, LOC, HA, chest pain, shortness of breath, abdominal pain, nausea, vomiting, syncope, dizziness, numbness, tingling, or or weakness.   Relative also reports that home health nurse reported malodorous urine.  Patient relative also reports urinary frequency.  Patient denies dysuria, hematuria, fever, chills, or abdominal pain.   The history is provided by the patient. No language interpreter was used.  Fall This is a new problem. The current episode started yesterday. Pertinent negatives include no chest pain, no abdominal pain, no headaches and no shortness of breath. Associated symptoms comments: +right rib pain. Nothing aggravates the symptoms. Nothing relieves the symptoms. He has tried nothing for the symptoms.      Past Medical History:  Diagnosis Date   Bladder cancer ([place]) 08/18/2013   Cancer ([place])    papillary urethral carcinoma,low grade, non-invasive   Diabetes mellitus without complication ([place])    Erectile dysfunction 10/24/2016   Hyperlipidemia associated with type 2 diabetes mellitus ([place]) 05/17/2013   Hypertension associated with diabetes ([place])    Hypokalemia 11/27/2017   Hypomagnesemia 11/27/2017   Major frontotemporal neurocognitive disorder, probable, with behavioral disturbance 11/27/2017   Vitamin D deficiency 05/17/2013    Patient Active Problem List    Diagnosis Date Noted   NSTEMI (non-ST elevated myocardial infarction) ([place]) 09/23/2021   S/P CABG x 4 08/23/2021   Acute anterior wall MI ([place]) 08/16/2021   Chest pain 08/15/2021   Right upper lobe pulmonary nodule 08/15/2021   Hypoalbuminemia due to protein-calorie malnutrition ([place]) 08/15/2021   AKI (acute kidney injury) ([place]) 08/15/2021   Hyperglycemia due to diabetes mellitus ([place]) 08/15/2021   BPH (benign prostatic hyperplasia) 08/15/2021   Hypertension associated with diabetes ([place]) 06/25/2021   Male erectile disorder 03/05/2021   Diabetic polyneuropathy associated with type 2 diabetes mellitus ([place]) 05/07/2020   Frontotemporal dementia with behavioral disturbance ([place]) 01/07/2020   Noncompliance with diabetes treatment 01/07/2020   Generalized weakness    Neurological deficit present    Facial droop 08/17/2019   Hx of bladder cancer 07/29/2019   Abnormal stress test 08/11/2018   PVC's (premature ventricular contractions) 08/11/2018   Hypokalemia 11/27/2017   Hypomagnesemia 11/27/2017   Major frontotemporal neurocognitive disorder, probable, with behavioral disturbance 11/27/2017   Uncontrolled type 2 diabetes mellitus with hyperglycemia ([place]) 11/27/2017   Erectile dysfunction 10/24/2016   Bladder cancer ([place]) 08/18/2013   Diabetes mellitus ([place])    Vitamin D deficiency 05/17/2013   Hyperlipidemia associated with type 2 diabetes mellitus ([place]) 05/17/2013    Past Surgical History:  Procedure Laterality Date   COLONOSCOPY W/ POLYPECTOMY     CORONARY ARTERY BYPASS GRAFT N/A 08/22/2021   Procedure: CORONARY ARTERY BYPASS GRAFTING (CABG), ON PUMP, TIMES FOUR , USING LEFT INTERNAL MAMMARY ARTERY AND ENDOSCOPICALLY HARVESTED RIGHT GREATER SAPHENOUS VEIN;  Surgeon: Lajuana Matte, MD;  Location: [place];  Service: Open  Heart Surgery;  Laterality: N/A;   CORONARY BALLOON ANGIOPLASTY N/A 08/16/2021   Procedure: CORONARY BALLOON ANGIOPLASTY;  Surgeon: Jettie Booze, MD;   Location: [place] [facility];  Service: Cardiovascular;  Laterality: N/A;   ENDOVEIN HARVEST OF GREATER SAPHENOUS VEIN  08/22/2021   Procedure: ENDOVEIN HARVEST OF GREATER SAPHENOUS VEIN;  Surgeon: Lajuana Matte, MD;  Location: [place];  Service: Open Heart Surgery;;   LEFT HEART CATH AND CORONARY ANGIOGRAPHY N/A 08/16/2021   Procedure: LEFT HEART CATH AND CORONARY ANGIOGRAPHY;  Surgeon: Jettie Booze, MD;  Location: [place] [facility];  Service: Cardiovascular;  Laterality: N/A;   TEE WITHOUT CARDIOVERSION N/A 08/22/2021   Procedure: TRANSESOPHAGEAL ECHOCARDIOGRAM (TEE);  Surgeon: Lajuana Matte, MD;  Location: [place];  Service: Open Heart Surgery;  Laterality: N/A;       Family History  Problem Relation Age of Onset   Heart attack Maternal Grandmother    Stroke Maternal Grandfather    Heart attack Mother 45    Social History   Tobacco Use   Smoking status: Former    Types: Cigarettes    Quit date: 05/17/1973    Years since quitting: 48.4   Smokeless tobacco: Never  Vaping Use   Vaping Use: Never used  Substance Use Topics   Alcohol use: No   Drug use: No    Home Medications Prior to Admission medications   Medication Sig Start Date End Date Taking? Authorizing Provider  aspirin EC 81 MG EC tablet Take 1 tablet (81 mg total) by mouth daily. Swallow whole. 08/30/21  Yes Barrett, Erin R, PA-C  blood glucose meter kit and supplies KIT Dispense based on patient and insurance preference. Use up to twice a daily as directed. (FOR ICD-10 - type 2 DM uncontrolled E11.9) 08/19/19  Yes Johnson, Clanford L, MD  blood glucose meter kit and supplies Dispense based on patient and insurance preference. Use up to four times daily as directed. (FOR ICD-10 E10.9, E11.9). 08/23/19  Yes Hawks, Christy A, FNP  blood glucose meter kit and supplies Use up to four times daily as directed. dx E11.9 08/23/19  Yes Terald Sleeper, PA-C  Blood Glucose Monitoring Suppl (ONE TOUCH ULTRA 2) w/Device KIT  USE TO CHECK BLOOD SUGAR UP TO 4 TIMES DAILY AS DIRECTED 05/15/20  Yes Hawks, Christy A, FNP  clopidogrel (PLAVIX) 75 MG tablet Take 1 tablet (75 mg total) by mouth daily. 08/30/21  Yes Barrett, Erin R, PA-C  dapagliflozin propanediol (FARXIGA) 10 MG TABS tablet Take 1 tablet (10 mg total) by mouth daily. 08/30/21  Yes Barrett, Erin R, PA-C  docusate sodium (COLACE) 100 MG capsule Take 100 mg by mouth 2 (two) times daily.   Yes [provider]  ezetimibe (ZETIA) 10 MG tablet Take 1 tablet (10 mg total) by mouth daily. 10/17/21 01/15/22 Yes Meng, Isaac Laud, PA  glucose blood test strip Use as instructed 05/15/20  Yes Hawks, Christy A, FNP  insulin degludec (TRESIBA FLEXTOUCH) 100 UNIT/ML SOPN FlexTouch Pen Inject 0.05 mLs (5 Units total) into the skin daily. Patient taking differently: Inject 18 Units into the skin daily. 08/10/19  Yes Hawks, Christy A, FNP  Insulin Pen Needle (PEN NEEDLES) 32G X 4 MM MISC 100 each by Does not apply route 4 (four) times daily. E10.9 05/16/20  Yes Hawks, Christy A, FNP  isosorbide mononitrate (IMDUR) 60 MG 24 hr tablet Take 1 tablet (60 mg total) by mouth daily. 09/10/21  Yes Almyra Deforest, PA  Lancets (  ONETOUCH DELICA PLUS [id number]) MISC USE TO CHECK BLOOD SUGAR UP TO 4 TIMES A DAY AS DIRECTED 01/23/20  Yes Hawks, Christy A, FNP  levofloxacin (LEVAQUIN) 500 MG tablet Take 1 tablet (500 mg total) by mouth daily. 10/21/21  Yes Lanee Chain A, PA  metoprolol tartrate (LOPRESSOR) 50 MG tablet Take 1 tablet (50 mg total) by mouth 2 (two) times daily. 08/30/21  Yes Barrett, Erin R, PA-C  Multiple Vitamin (MULTIVITAMIN WITH MINERALS) TABS tablet Take 1 tablet by mouth daily.   Yes [provider]  nitroGLYCERIN (NITROSTAT) 0.4 MG SL tablet Place 0.4 mg under the tongue every 5 (five) minutes as needed for chest pain.   Yes [provider]  OLANZapine (ZYPREXA) 5 MG tablet Take 5 mg by mouth at bedtime.   Yes [provider]  rosuvastatin (CRESTOR) 40 MG tablet  Take 1 tablet (40 mg total) by mouth daily. 08/30/21  Yes Barrett, Erin R, PA-C  tamsulosin (FLOMAX) 0.4 MG CAPS capsule Take 0.4 mg by mouth daily.   Yes [provider]  insulin aspart (NOVOLOG) 100 UNIT/ML FlexPen Inject 9 Units into the skin 2 (two) times daily with a meal. Patient not taking: Reported on 10/21/2021 01/10/20   Evelina Dun A, FNP  oxyCODONE (OXY IR/ROXICODONE) 5 MG immediate release tablet Take 1-2 tablets (5-10 mg total) by mouth every 4 (four) hours as needed for severe pain. Patient not taking: No sig reported 09/06/21   Lajuana Matte, MD    Allergies    Enalapril  Review of Systems   Review of Systems  Constitutional:  Negative for chills and fever.  Respiratory:  Negative for shortness of breath.   Cardiovascular:  Negative for chest pain.  Gastrointestinal:  Negative for abdominal pain, nausea and vomiting.  Genitourinary:  Positive for frequency. Negative for dysuria and hematuria.       +malodorous urine  Musculoskeletal:  Negative for arthralgias and back pain.       +right rib pain  Skin:  Negative for rash.  Neurological:  Positive for light-headedness. Negative for dizziness, syncope, weakness, numbness and headaches.       -Tingling   Physical Exam Updated Vital Signs BP 113/70 (BP Location: Right Arm)   Pulse 83   Temp 99.3 F (37.4 C) (Oral)   Resp 18   Ht _0  (1.702 m)   Wt 68 kg   SpO2 99%   BMI 23.49 kg/m   Physical Exam Vitals and nursing note reviewed.  Constitutional:      General: He is not in acute distress.    Appearance: Normal appearance. He is well-developed.  HENT:     Head: Normocephalic and atraumatic.     Mouth/Throat:     Pharynx: No oropharyngeal exudate.  Eyes:     General: No scleral icterus.       Right eye: No discharge.        Left eye: No discharge.     Extraocular Movements: Extraocular movements intact.  Neck:     Thyroid: No thyromegaly.     Vascular: No JVD.  Cardiovascular:      Rate and Rhythm: Normal rate and regular rhythm.     Pulses: Normal pulses.     Heart sounds: Normal heart sounds. No murmur heard.   No friction rub. No gallop.  Pulmonary:     Effort: Pulmonary effort is normal. No respiratory distress.     Breath sounds: Normal breath sounds. No wheezing or rales.  Chest:  Chest wall: Tenderness present. No lacerations, deformity, swelling or edema.     Comments: Tenderness to palpation to right lateral ribs.  No obvious deformity, ecchymosis, or step-off. Abdominal:     General: Bowel sounds are normal. There is no distension.     Palpations: Abdomen is soft. There is no mass.     Tenderness: There is no abdominal tenderness. There is no right CVA tenderness or left CVA tenderness.  Musculoskeletal:        General: No tenderness. Normal range of motion.     Cervical back: Normal range of motion and neck supple.     Right lower leg: No edema.     Left lower leg: No edema.  Skin:    General: Skin is warm and dry.     Findings: No erythema or rash.  Neurological:     Mental Status: He is alert.     Coordination: Coordination normal.  Psychiatric:        Behavior: Behavior normal.    ED Results / Procedures / Treatments   Labs (all labs ordered are listed, but only abnormal results are displayed) Labs Reviewed  URINALYSIS, ROUTINE W REFLEX MICROSCOPIC - Abnormal; Notable for the following components:      Result Value   APPearance CLOUDY (*)    Glucose, UA 150 (*)    Hgb urine dipstick SMALL (*)    Ketones, ur 5 (*)    Protein, ur 100 (*)    Leukocytes,Ua LARGE (*)    WBC, UA >50 (*)    Bacteria, UA RARE (*)    All other components within normal limits  BASIC METABOLIC PANEL - Abnormal; Notable for the following components:   Glucose, Bld 151 (*)    BUN 27 (*)    Creatinine, Ser 1.55 (*)    GFR, Estimated 46 (*)    All other components within normal limits  CBC WITH DIFFERENTIAL/PLATELET - Abnormal; Notable for the following  components:   WBC 12.3 (*)    MCH 25.9 (*)    Neutro Abs 10.3 (*)    Monocytes Absolute 1.2 (*)    All other components within normal limits  TROPONIN I (HIGH SENSITIVITY)  TROPONIN I (HIGH SENSITIVITY)    EKG EKG Interpretation  Date/Time:  Monday October 21 2021 17:55:00 EST Ventricular Rate:  82 PR Interval:  152 QRS Duration: 95 QT Interval:  369 QTC Calculation: 431 R Axis:   3 Text Interpretation: Sinus rhythm Ventricular premature complex Probable left atrial enlargement Anterior infarct, old Abnrm T, consider ischemia, anterolateral lds Minimal ST elevation, inferior leads Since last tracing T wave inversion Present Confirmed by Noemi Chapel [phone]) on 10/21/2021 8:16:12 PM  Radiology DG Chest 2 View  Result Date: 10/21/2021 CLINICAL DATA:  Right-sided rib pain EXAM: CHEST - 2 VIEW COMPARISON:  10/04/2021 FINDINGS: Post sternotomy changes. Small bilateral pleural effusions with basilar airspace disease. Patchy right midlung airspace disease. Mild cardiomegaly with vascular congestion. Aortic atherosclerosis. No pneumothorax allowing for the patient's chin obscuring the apices. Multiple chronic right rib fractures. IMPRESSION: 1. Cardiomegaly with vascular congestion and small bilateral effusions. 2. Bibasilar airspace disease with patchy right mid lung opacity, atelectasis versus pneumonia Electronically Signed   By: Donavan Foil M.D.   On: 10/21/2021 18:14    Procedures Procedures   Medications Ordered in ED Medications  levofloxacin (LEVAQUIN) tablet 500 mg (500 mg Oral Given 10/21/21 2117)    ED Course  I have reviewed the triage vital signs and the  nursing notes.  Pertinent labs & imaging results that were available during my care of the patient were reviewed by me and considered in my medical decision making (see chart for details).  5:54 PM -Patient treatment plan discussed with attending.  Attending agreeable with plan.  7:26 PM -Discussed with patient and  relative regarding findings.  Patient provided water for urinalysis.  9:00 PM -patient reevaluated.  Discussed with patient and wife plan.  Patient and wife are agreeable at this time.     MDM Rules/Calculators/A&P                          Patient presents status post fall onset last night.  Patient reports feeling lightheaded prior to the fall. Patient denies hitting his head or loss of consciousness. Patient with right lateral rib pain.  On exam patient with right lateral rib pain otherwise no acute cardiovascular, respiratory, abdominal findings. Vital signs within normal limits, patient afebrile, oxygen saturation at 99%.   Patient with extensive cardiac history with CABG procedure completed at the end of September 2022. Chest x-ray obtained and showed cardiomegaly with vascular congestion and small bilateral effusions.  No acute fracture or dislocation noted.  Cardiac troponins normal.  Labs obtained and showed slightly elevated WBC and creatinine similar to prior results.  Patient remains afebrile. Urinalysis obtained and showed positive for large amount of leukocytes without nitrates. EKG obtained with T wave inversions noted, however unchanged from EKG obtained on 09/10/2021 status post CABG. Pt without chest pain while in the ED.  Due to findings on chest x-ray, urinalysis, and urinary complaints, patient will be given a dose of Levaquin here in the ED.  Patient will also be sent home a prescription of Levaquin. Strict return precautions given.  Patient acknowledges and voices understanding.  Patient appears safe for discharge at this time.  Follow-up as indicated in discharge paperwork.   Final Clinical Impression(s) / ED Diagnoses Final diagnoses:  Fall, initial encounter  Rib pain    Rx / DC Orders ED Discharge Orders          Ordered    levofloxacin (LEVAQUIN) 500 MG tablet  Daily        10/21/21 2057             Kiyah Demartini A, PA 10/21/21 2143    Noemi Chapel,  MD 10/25/21 1447

## 2021-10-21 NOTE — ED Triage Notes (Signed)
Patient here from home after reportedly falling out of bed last night. Complaining of right rib pain. HHN is requesting that he have CXR and U/A because of strong odor of urine. Patient was getting out of bed to spit when he slid down, fire department activated to assist patient to bed due to weakness.

## 2021-10-21 NOTE — ED Notes (Signed)
Pt attempted to urinate but was unable to void but states he feels the need to urinate

## 2021-10-21 NOTE — ED Provider Notes (Signed)
Medical screening examination/treatment/procedure(s) were conducted as a shared visit with non-physician practitioner(s) and myself.  I personally evaluated the patient during the encounter.  Clinical Impression:   Final diagnoses:  None    Patient is a 75 year old male, had an accidental slip and fall, with some tenderness over the right ribs, he is not having any other chest pain, he is not actively having any left-sided chest pain just pain on the right when you palpated when he takes of breath.  Patient does have some evidence of pneumonia on his x-ray but no signs of trauma, EKG is unchanged from September 27, he was thought to have diffusely diseased left anterior descending coronary disease he has a negative troponin today.  Urinalysis reveals infection, treat with Levaquin, stable for discharge, vitals are unremarkable  Medical screening examination/treatment/procedure(s) were conducted as a shared visit with non-physician practitioner(s) and myself.  I personally evaluated the patient during the encounter.  Clinical Impression:   Final diagnoses:  None         Noemi Chapel, MD 10/25/21 [id number]

## 2021-10-21 NOTE — Discharge Instructions (Addendum)
Ensure to complete entire course of antibiotic.  Return to the emergency department if experiencing worsening or persistent chest pain, cough, or shortness of breath.

## 2021-10-23 DIAGNOSIS — I152 Hypertension secondary to endocrine disorders: Secondary | ICD-10-CM | POA: Diagnosis not present

## 2021-10-23 DIAGNOSIS — Z48812 Encounter for surgical aftercare following surgery on the circulatory system: Secondary | ICD-10-CM | POA: Diagnosis not present

## 2021-10-23 DIAGNOSIS — Z7984 Long term (current) use of oral hypoglycemic drugs: Secondary | ICD-10-CM | POA: Diagnosis not present

## 2021-10-23 DIAGNOSIS — E785 Hyperlipidemia, unspecified: Secondary | ICD-10-CM | POA: Diagnosis not present

## 2021-10-23 DIAGNOSIS — E1165 Type 2 diabetes mellitus with hyperglycemia: Secondary | ICD-10-CM | POA: Diagnosis not present

## 2021-10-23 DIAGNOSIS — E559 Vitamin D deficiency, unspecified: Secondary | ICD-10-CM | POA: Diagnosis not present

## 2021-10-23 DIAGNOSIS — I2109 ST elevation (STEMI) myocardial infarction involving other coronary artery of anterior wall: Secondary | ICD-10-CM | POA: Diagnosis not present

## 2021-10-23 DIAGNOSIS — N400 Enlarged prostate without lower urinary tract symptoms: Secondary | ICD-10-CM | POA: Diagnosis not present

## 2021-10-23 DIAGNOSIS — Z794 Long term (current) use of insulin: Secondary | ICD-10-CM | POA: Diagnosis not present

## 2021-10-23 DIAGNOSIS — E1169 Type 2 diabetes mellitus with other specified complication: Secondary | ICD-10-CM | POA: Diagnosis not present

## 2021-10-23 DIAGNOSIS — R911 Solitary pulmonary nodule: Secondary | ICD-10-CM | POA: Diagnosis not present

## 2021-10-23 DIAGNOSIS — E1159 Type 2 diabetes mellitus with other circulatory complications: Secondary | ICD-10-CM | POA: Diagnosis not present

## 2021-10-23 DIAGNOSIS — Z7982 Long term (current) use of aspirin: Secondary | ICD-10-CM | POA: Diagnosis not present

## 2021-10-23 DIAGNOSIS — E441 Mild protein-calorie malnutrition: Secondary | ICD-10-CM | POA: Diagnosis not present

## 2021-10-23 DIAGNOSIS — Z7902 Long term (current) use of antithrombotics/antiplatelets: Secondary | ICD-10-CM | POA: Diagnosis not present

## 2021-10-23 DIAGNOSIS — N179 Acute kidney failure, unspecified: Secondary | ICD-10-CM | POA: Diagnosis not present

## 2021-10-23 DIAGNOSIS — Z8551 Personal history of malignant neoplasm of bladder: Secondary | ICD-10-CM | POA: Diagnosis not present

## 2021-10-23 DIAGNOSIS — Z951 Presence of aortocoronary bypass graft: Secondary | ICD-10-CM | POA: Diagnosis not present

## 2021-10-25 DIAGNOSIS — R911 Solitary pulmonary nodule: Secondary | ICD-10-CM | POA: Diagnosis not present

## 2021-10-25 DIAGNOSIS — N400 Enlarged prostate without lower urinary tract symptoms: Secondary | ICD-10-CM | POA: Diagnosis not present

## 2021-10-25 DIAGNOSIS — E559 Vitamin D deficiency, unspecified: Secondary | ICD-10-CM | POA: Diagnosis not present

## 2021-10-25 DIAGNOSIS — E441 Mild protein-calorie malnutrition: Secondary | ICD-10-CM | POA: Diagnosis not present

## 2021-10-25 DIAGNOSIS — E1165 Type 2 diabetes mellitus with hyperglycemia: Secondary | ICD-10-CM | POA: Diagnosis not present

## 2021-10-25 DIAGNOSIS — Z951 Presence of aortocoronary bypass graft: Secondary | ICD-10-CM | POA: Diagnosis not present

## 2021-10-25 DIAGNOSIS — Z8551 Personal history of malignant neoplasm of bladder: Secondary | ICD-10-CM | POA: Diagnosis not present

## 2021-10-25 DIAGNOSIS — I152 Hypertension secondary to endocrine disorders: Secondary | ICD-10-CM | POA: Diagnosis not present

## 2021-10-25 DIAGNOSIS — E785 Hyperlipidemia, unspecified: Secondary | ICD-10-CM | POA: Diagnosis not present

## 2021-10-25 DIAGNOSIS — Z7984 Long term (current) use of oral hypoglycemic drugs: Secondary | ICD-10-CM | POA: Diagnosis not present

## 2021-10-25 DIAGNOSIS — E1159 Type 2 diabetes mellitus with other circulatory complications: Secondary | ICD-10-CM | POA: Diagnosis not present

## 2021-10-25 DIAGNOSIS — Z7982 Long term (current) use of aspirin: Secondary | ICD-10-CM | POA: Diagnosis not present

## 2021-10-25 DIAGNOSIS — N179 Acute kidney failure, unspecified: Secondary | ICD-10-CM | POA: Diagnosis not present

## 2021-10-25 DIAGNOSIS — Z794 Long term (current) use of insulin: Secondary | ICD-10-CM | POA: Diagnosis not present

## 2021-10-25 DIAGNOSIS — E1169 Type 2 diabetes mellitus with other specified complication: Secondary | ICD-10-CM | POA: Diagnosis not present

## 2021-10-25 DIAGNOSIS — I2109 ST elevation (STEMI) myocardial infarction involving other coronary artery of anterior wall: Secondary | ICD-10-CM | POA: Diagnosis not present

## 2021-10-25 DIAGNOSIS — Z7902 Long term (current) use of antithrombotics/antiplatelets: Secondary | ICD-10-CM | POA: Diagnosis not present

## 2021-10-25 DIAGNOSIS — Z48812 Encounter for surgical aftercare following surgery on the circulatory system: Secondary | ICD-10-CM | POA: Diagnosis not present

## 2021-10-29 DIAGNOSIS — E1165 Type 2 diabetes mellitus with hyperglycemia: Secondary | ICD-10-CM | POA: Diagnosis not present

## 2021-10-29 DIAGNOSIS — I10 Essential (primary) hypertension: Secondary | ICD-10-CM | POA: Diagnosis not present

## 2021-10-30 DIAGNOSIS — E441 Mild protein-calorie malnutrition: Secondary | ICD-10-CM | POA: Diagnosis not present

## 2021-10-30 DIAGNOSIS — Z7902 Long term (current) use of antithrombotics/antiplatelets: Secondary | ICD-10-CM | POA: Diagnosis not present

## 2021-10-30 DIAGNOSIS — N179 Acute kidney failure, unspecified: Secondary | ICD-10-CM | POA: Diagnosis not present

## 2021-10-30 DIAGNOSIS — Z794 Long term (current) use of insulin: Secondary | ICD-10-CM | POA: Diagnosis not present

## 2021-10-30 DIAGNOSIS — E1165 Type 2 diabetes mellitus with hyperglycemia: Secondary | ICD-10-CM | POA: Diagnosis not present

## 2021-10-30 DIAGNOSIS — E1169 Type 2 diabetes mellitus with other specified complication: Secondary | ICD-10-CM | POA: Diagnosis not present

## 2021-10-30 DIAGNOSIS — N400 Enlarged prostate without lower urinary tract symptoms: Secondary | ICD-10-CM | POA: Diagnosis not present

## 2021-10-30 DIAGNOSIS — Z951 Presence of aortocoronary bypass graft: Secondary | ICD-10-CM | POA: Diagnosis not present

## 2021-10-30 DIAGNOSIS — E559 Vitamin D deficiency, unspecified: Secondary | ICD-10-CM | POA: Diagnosis not present

## 2021-10-30 DIAGNOSIS — Z7984 Long term (current) use of oral hypoglycemic drugs: Secondary | ICD-10-CM | POA: Diagnosis not present

## 2021-10-30 DIAGNOSIS — Z7982 Long term (current) use of aspirin: Secondary | ICD-10-CM | POA: Diagnosis not present

## 2021-10-30 DIAGNOSIS — E1159 Type 2 diabetes mellitus with other circulatory complications: Secondary | ICD-10-CM | POA: Diagnosis not present

## 2021-10-30 DIAGNOSIS — Z48812 Encounter for surgical aftercare following surgery on the circulatory system: Secondary | ICD-10-CM | POA: Diagnosis not present

## 2021-10-30 DIAGNOSIS — E785 Hyperlipidemia, unspecified: Secondary | ICD-10-CM | POA: Diagnosis not present

## 2021-10-30 DIAGNOSIS — R911 Solitary pulmonary nodule: Secondary | ICD-10-CM | POA: Diagnosis not present

## 2021-10-30 DIAGNOSIS — I152 Hypertension secondary to endocrine disorders: Secondary | ICD-10-CM | POA: Diagnosis not present

## 2021-10-30 DIAGNOSIS — Z8551 Personal history of malignant neoplasm of bladder: Secondary | ICD-10-CM | POA: Diagnosis not present

## 2021-10-30 DIAGNOSIS — I2109 ST elevation (STEMI) myocardial infarction involving other coronary artery of anterior wall: Secondary | ICD-10-CM | POA: Diagnosis not present

## 2021-10-31 DIAGNOSIS — Z794 Long term (current) use of insulin: Secondary | ICD-10-CM | POA: Diagnosis not present

## 2021-10-31 DIAGNOSIS — N400 Enlarged prostate without lower urinary tract symptoms: Secondary | ICD-10-CM | POA: Diagnosis not present

## 2021-10-31 DIAGNOSIS — E559 Vitamin D deficiency, unspecified: Secondary | ICD-10-CM | POA: Diagnosis not present

## 2021-10-31 DIAGNOSIS — N179 Acute kidney failure, unspecified: Secondary | ICD-10-CM | POA: Diagnosis not present

## 2021-10-31 DIAGNOSIS — I152 Hypertension secondary to endocrine disorders: Secondary | ICD-10-CM | POA: Diagnosis not present

## 2021-10-31 DIAGNOSIS — Z7902 Long term (current) use of antithrombotics/antiplatelets: Secondary | ICD-10-CM | POA: Diagnosis not present

## 2021-10-31 DIAGNOSIS — Z951 Presence of aortocoronary bypass graft: Secondary | ICD-10-CM | POA: Diagnosis not present

## 2021-10-31 DIAGNOSIS — Z8551 Personal history of malignant neoplasm of bladder: Secondary | ICD-10-CM | POA: Diagnosis not present

## 2021-10-31 DIAGNOSIS — E441 Mild protein-calorie malnutrition: Secondary | ICD-10-CM | POA: Diagnosis not present

## 2021-10-31 DIAGNOSIS — Z48812 Encounter for surgical aftercare following surgery on the circulatory system: Secondary | ICD-10-CM | POA: Diagnosis not present

## 2021-10-31 DIAGNOSIS — E785 Hyperlipidemia, unspecified: Secondary | ICD-10-CM | POA: Diagnosis not present

## 2021-10-31 DIAGNOSIS — E1165 Type 2 diabetes mellitus with hyperglycemia: Secondary | ICD-10-CM | POA: Diagnosis not present

## 2021-10-31 DIAGNOSIS — E1169 Type 2 diabetes mellitus with other specified complication: Secondary | ICD-10-CM | POA: Diagnosis not present

## 2021-10-31 DIAGNOSIS — E1159 Type 2 diabetes mellitus with other circulatory complications: Secondary | ICD-10-CM | POA: Diagnosis not present

## 2021-10-31 DIAGNOSIS — Z7982 Long term (current) use of aspirin: Secondary | ICD-10-CM | POA: Diagnosis not present

## 2021-10-31 DIAGNOSIS — Z7984 Long term (current) use of oral hypoglycemic drugs: Secondary | ICD-10-CM | POA: Diagnosis not present

## 2021-10-31 DIAGNOSIS — R911 Solitary pulmonary nodule: Secondary | ICD-10-CM | POA: Diagnosis not present

## 2021-10-31 DIAGNOSIS — I2109 ST elevation (STEMI) myocardial infarction involving other coronary artery of anterior wall: Secondary | ICD-10-CM | POA: Diagnosis not present

## 2021-11-05 DIAGNOSIS — Z9181 History of falling: Secondary | ICD-10-CM | POA: Diagnosis not present

## 2021-11-05 DIAGNOSIS — N390 Urinary tract infection, site not specified: Secondary | ICD-10-CM | POA: Diagnosis not present

## 2021-11-05 DIAGNOSIS — E441 Mild protein-calorie malnutrition: Secondary | ICD-10-CM | POA: Diagnosis not present

## 2021-11-05 DIAGNOSIS — Z48812 Encounter for surgical aftercare following surgery on the circulatory system: Secondary | ICD-10-CM | POA: Diagnosis not present

## 2021-11-05 DIAGNOSIS — Z7982 Long term (current) use of aspirin: Secondary | ICD-10-CM | POA: Diagnosis not present

## 2021-11-05 DIAGNOSIS — I214 Non-ST elevation (NSTEMI) myocardial infarction: Secondary | ICD-10-CM | POA: Diagnosis not present

## 2021-11-05 DIAGNOSIS — J188 Other pneumonia, unspecified organism: Secondary | ICD-10-CM | POA: Diagnosis not present

## 2021-11-05 DIAGNOSIS — I2109 ST elevation (STEMI) myocardial infarction involving other coronary artery of anterior wall: Secondary | ICD-10-CM | POA: Diagnosis not present

## 2021-11-05 DIAGNOSIS — E119 Type 2 diabetes mellitus without complications: Secondary | ICD-10-CM | POA: Diagnosis not present

## 2021-11-05 DIAGNOSIS — E785 Hyperlipidemia, unspecified: Secondary | ICD-10-CM | POA: Diagnosis not present

## 2021-11-05 DIAGNOSIS — Z7984 Long term (current) use of oral hypoglycemic drugs: Secondary | ICD-10-CM | POA: Diagnosis not present

## 2021-11-05 DIAGNOSIS — Z7902 Long term (current) use of antithrombotics/antiplatelets: Secondary | ICD-10-CM | POA: Diagnosis not present

## 2021-11-05 DIAGNOSIS — Z794 Long term (current) use of insulin: Secondary | ICD-10-CM | POA: Diagnosis not present

## 2021-11-05 DIAGNOSIS — R911 Solitary pulmonary nodule: Secondary | ICD-10-CM | POA: Diagnosis not present

## 2021-11-05 DIAGNOSIS — E559 Vitamin D deficiency, unspecified: Secondary | ICD-10-CM | POA: Diagnosis not present

## 2021-11-05 DIAGNOSIS — E1159 Type 2 diabetes mellitus with other circulatory complications: Secondary | ICD-10-CM | POA: Diagnosis not present

## 2021-11-05 DIAGNOSIS — E1165 Type 2 diabetes mellitus with hyperglycemia: Secondary | ICD-10-CM | POA: Diagnosis not present

## 2021-11-05 DIAGNOSIS — Z951 Presence of aortocoronary bypass graft: Secondary | ICD-10-CM | POA: Diagnosis not present

## 2021-11-05 DIAGNOSIS — R079 Chest pain, unspecified: Secondary | ICD-10-CM | POA: Diagnosis not present

## 2021-11-05 DIAGNOSIS — I152 Hypertension secondary to endocrine disorders: Secondary | ICD-10-CM | POA: Diagnosis not present

## 2021-11-05 DIAGNOSIS — N400 Enlarged prostate without lower urinary tract symptoms: Secondary | ICD-10-CM | POA: Diagnosis not present

## 2021-11-05 DIAGNOSIS — Z8551 Personal history of malignant neoplasm of bladder: Secondary | ICD-10-CM | POA: Diagnosis not present

## 2021-11-05 DIAGNOSIS — E1169 Type 2 diabetes mellitus with other specified complication: Secondary | ICD-10-CM | POA: Diagnosis not present

## 2021-11-06 DIAGNOSIS — Z951 Presence of aortocoronary bypass graft: Secondary | ICD-10-CM | POA: Diagnosis not present

## 2021-11-06 DIAGNOSIS — Z9181 History of falling: Secondary | ICD-10-CM | POA: Diagnosis not present

## 2021-11-06 DIAGNOSIS — J188 Other pneumonia, unspecified organism: Secondary | ICD-10-CM | POA: Diagnosis not present

## 2021-11-06 DIAGNOSIS — E785 Hyperlipidemia, unspecified: Secondary | ICD-10-CM | POA: Diagnosis not present

## 2021-11-06 DIAGNOSIS — I2109 ST elevation (STEMI) myocardial infarction involving other coronary artery of anterior wall: Secondary | ICD-10-CM | POA: Diagnosis not present

## 2021-11-06 DIAGNOSIS — E119 Type 2 diabetes mellitus without complications: Secondary | ICD-10-CM | POA: Diagnosis not present

## 2021-11-06 DIAGNOSIS — Z794 Long term (current) use of insulin: Secondary | ICD-10-CM | POA: Diagnosis not present

## 2021-11-06 DIAGNOSIS — E1159 Type 2 diabetes mellitus with other circulatory complications: Secondary | ICD-10-CM | POA: Diagnosis not present

## 2021-11-06 DIAGNOSIS — E1165 Type 2 diabetes mellitus with hyperglycemia: Secondary | ICD-10-CM | POA: Diagnosis not present

## 2021-11-06 DIAGNOSIS — E1169 Type 2 diabetes mellitus with other specified complication: Secondary | ICD-10-CM | POA: Diagnosis not present

## 2021-11-06 DIAGNOSIS — Z7902 Long term (current) use of antithrombotics/antiplatelets: Secondary | ICD-10-CM | POA: Diagnosis not present

## 2021-11-06 DIAGNOSIS — Z8551 Personal history of malignant neoplasm of bladder: Secondary | ICD-10-CM | POA: Diagnosis not present

## 2021-11-06 DIAGNOSIS — R079 Chest pain, unspecified: Secondary | ICD-10-CM | POA: Diagnosis not present

## 2021-11-06 DIAGNOSIS — I214 Non-ST elevation (NSTEMI) myocardial infarction: Secondary | ICD-10-CM | POA: Diagnosis not present

## 2021-11-06 DIAGNOSIS — E559 Vitamin D deficiency, unspecified: Secondary | ICD-10-CM | POA: Diagnosis not present

## 2021-11-06 DIAGNOSIS — N400 Enlarged prostate without lower urinary tract symptoms: Secondary | ICD-10-CM | POA: Diagnosis not present

## 2021-11-06 DIAGNOSIS — I152 Hypertension secondary to endocrine disorders: Secondary | ICD-10-CM | POA: Diagnosis not present

## 2021-11-06 DIAGNOSIS — Z7984 Long term (current) use of oral hypoglycemic drugs: Secondary | ICD-10-CM | POA: Diagnosis not present

## 2021-11-06 DIAGNOSIS — Z7982 Long term (current) use of aspirin: Secondary | ICD-10-CM | POA: Diagnosis not present

## 2021-11-06 DIAGNOSIS — E441 Mild protein-calorie malnutrition: Secondary | ICD-10-CM | POA: Diagnosis not present

## 2021-11-06 DIAGNOSIS — N390 Urinary tract infection, site not specified: Secondary | ICD-10-CM | POA: Diagnosis not present

## 2021-11-06 DIAGNOSIS — R911 Solitary pulmonary nodule: Secondary | ICD-10-CM | POA: Diagnosis not present

## 2021-11-06 DIAGNOSIS — Z48812 Encounter for surgical aftercare following surgery on the circulatory system: Secondary | ICD-10-CM | POA: Diagnosis not present

## 2021-11-08 DIAGNOSIS — E785 Hyperlipidemia, unspecified: Secondary | ICD-10-CM | POA: Diagnosis not present

## 2021-11-08 DIAGNOSIS — Z7984 Long term (current) use of oral hypoglycemic drugs: Secondary | ICD-10-CM | POA: Diagnosis not present

## 2021-11-08 DIAGNOSIS — J188 Other pneumonia, unspecified organism: Secondary | ICD-10-CM | POA: Diagnosis not present

## 2021-11-08 DIAGNOSIS — Z951 Presence of aortocoronary bypass graft: Secondary | ICD-10-CM | POA: Diagnosis not present

## 2021-11-08 DIAGNOSIS — R911 Solitary pulmonary nodule: Secondary | ICD-10-CM | POA: Diagnosis not present

## 2021-11-08 DIAGNOSIS — Z8551 Personal history of malignant neoplasm of bladder: Secondary | ICD-10-CM | POA: Diagnosis not present

## 2021-11-08 DIAGNOSIS — Z7982 Long term (current) use of aspirin: Secondary | ICD-10-CM | POA: Diagnosis not present

## 2021-11-08 DIAGNOSIS — E559 Vitamin D deficiency, unspecified: Secondary | ICD-10-CM | POA: Diagnosis not present

## 2021-11-08 DIAGNOSIS — I152 Hypertension secondary to endocrine disorders: Secondary | ICD-10-CM | POA: Diagnosis not present

## 2021-11-08 DIAGNOSIS — Z794 Long term (current) use of insulin: Secondary | ICD-10-CM | POA: Diagnosis not present

## 2021-11-08 DIAGNOSIS — N400 Enlarged prostate without lower urinary tract symptoms: Secondary | ICD-10-CM | POA: Diagnosis not present

## 2021-11-08 DIAGNOSIS — R079 Chest pain, unspecified: Secondary | ICD-10-CM | POA: Diagnosis not present

## 2021-11-08 DIAGNOSIS — E1169 Type 2 diabetes mellitus with other specified complication: Secondary | ICD-10-CM | POA: Diagnosis not present

## 2021-11-08 DIAGNOSIS — N390 Urinary tract infection, site not specified: Secondary | ICD-10-CM | POA: Diagnosis not present

## 2021-11-08 DIAGNOSIS — E1165 Type 2 diabetes mellitus with hyperglycemia: Secondary | ICD-10-CM | POA: Diagnosis not present

## 2021-11-08 DIAGNOSIS — I2109 ST elevation (STEMI) myocardial infarction involving other coronary artery of anterior wall: Secondary | ICD-10-CM | POA: Diagnosis not present

## 2021-11-08 DIAGNOSIS — Z48812 Encounter for surgical aftercare following surgery on the circulatory system: Secondary | ICD-10-CM | POA: Diagnosis not present

## 2021-11-08 DIAGNOSIS — E119 Type 2 diabetes mellitus without complications: Secondary | ICD-10-CM | POA: Diagnosis not present

## 2021-11-08 DIAGNOSIS — E441 Mild protein-calorie malnutrition: Secondary | ICD-10-CM | POA: Diagnosis not present

## 2021-11-08 DIAGNOSIS — E1159 Type 2 diabetes mellitus with other circulatory complications: Secondary | ICD-10-CM | POA: Diagnosis not present

## 2021-11-08 DIAGNOSIS — Z7902 Long term (current) use of antithrombotics/antiplatelets: Secondary | ICD-10-CM | POA: Diagnosis not present

## 2021-11-08 DIAGNOSIS — I214 Non-ST elevation (NSTEMI) myocardial infarction: Secondary | ICD-10-CM | POA: Diagnosis not present

## 2021-11-08 DIAGNOSIS — Z9181 History of falling: Secondary | ICD-10-CM | POA: Diagnosis not present

## 2021-11-11 DIAGNOSIS — E1159 Type 2 diabetes mellitus with other circulatory complications: Secondary | ICD-10-CM | POA: Diagnosis not present

## 2021-11-11 DIAGNOSIS — E785 Hyperlipidemia, unspecified: Secondary | ICD-10-CM | POA: Diagnosis not present

## 2021-11-11 DIAGNOSIS — Z794 Long term (current) use of insulin: Secondary | ICD-10-CM | POA: Diagnosis not present

## 2021-11-11 DIAGNOSIS — E559 Vitamin D deficiency, unspecified: Secondary | ICD-10-CM | POA: Diagnosis not present

## 2021-11-11 DIAGNOSIS — N400 Enlarged prostate without lower urinary tract symptoms: Secondary | ICD-10-CM | POA: Diagnosis not present

## 2021-11-11 DIAGNOSIS — R079 Chest pain, unspecified: Secondary | ICD-10-CM | POA: Diagnosis not present

## 2021-11-11 DIAGNOSIS — Z7982 Long term (current) use of aspirin: Secondary | ICD-10-CM | POA: Diagnosis not present

## 2021-11-11 DIAGNOSIS — N390 Urinary tract infection, site not specified: Secondary | ICD-10-CM | POA: Diagnosis not present

## 2021-11-11 DIAGNOSIS — J188 Other pneumonia, unspecified organism: Secondary | ICD-10-CM | POA: Diagnosis not present

## 2021-11-11 DIAGNOSIS — E1169 Type 2 diabetes mellitus with other specified complication: Secondary | ICD-10-CM | POA: Diagnosis not present

## 2021-11-11 DIAGNOSIS — I152 Hypertension secondary to endocrine disorders: Secondary | ICD-10-CM | POA: Diagnosis not present

## 2021-11-11 DIAGNOSIS — Z951 Presence of aortocoronary bypass graft: Secondary | ICD-10-CM | POA: Diagnosis not present

## 2021-11-11 DIAGNOSIS — E1165 Type 2 diabetes mellitus with hyperglycemia: Secondary | ICD-10-CM | POA: Diagnosis not present

## 2021-11-11 DIAGNOSIS — Z8551 Personal history of malignant neoplasm of bladder: Secondary | ICD-10-CM | POA: Diagnosis not present

## 2021-11-11 DIAGNOSIS — Z7984 Long term (current) use of oral hypoglycemic drugs: Secondary | ICD-10-CM | POA: Diagnosis not present

## 2021-11-11 DIAGNOSIS — Z7902 Long term (current) use of antithrombotics/antiplatelets: Secondary | ICD-10-CM | POA: Diagnosis not present

## 2021-11-11 DIAGNOSIS — Z9181 History of falling: Secondary | ICD-10-CM | POA: Diagnosis not present

## 2021-11-11 DIAGNOSIS — Z48812 Encounter for surgical aftercare following surgery on the circulatory system: Secondary | ICD-10-CM | POA: Diagnosis not present

## 2021-11-11 DIAGNOSIS — I214 Non-ST elevation (NSTEMI) myocardial infarction: Secondary | ICD-10-CM | POA: Diagnosis not present

## 2021-11-11 DIAGNOSIS — I2109 ST elevation (STEMI) myocardial infarction involving other coronary artery of anterior wall: Secondary | ICD-10-CM | POA: Diagnosis not present

## 2021-11-11 DIAGNOSIS — E441 Mild protein-calorie malnutrition: Secondary | ICD-10-CM | POA: Diagnosis not present

## 2021-11-11 DIAGNOSIS — R911 Solitary pulmonary nodule: Secondary | ICD-10-CM | POA: Diagnosis not present

## 2021-11-11 DIAGNOSIS — E119 Type 2 diabetes mellitus without complications: Secondary | ICD-10-CM | POA: Diagnosis not present

## 2021-11-14 DIAGNOSIS — E785 Hyperlipidemia, unspecified: Secondary | ICD-10-CM | POA: Diagnosis not present

## 2021-11-14 DIAGNOSIS — Z7902 Long term (current) use of antithrombotics/antiplatelets: Secondary | ICD-10-CM | POA: Diagnosis not present

## 2021-11-14 DIAGNOSIS — N390 Urinary tract infection, site not specified: Secondary | ICD-10-CM | POA: Diagnosis not present

## 2021-11-14 DIAGNOSIS — E441 Mild protein-calorie malnutrition: Secondary | ICD-10-CM | POA: Diagnosis not present

## 2021-11-14 DIAGNOSIS — I2109 ST elevation (STEMI) myocardial infarction involving other coronary artery of anterior wall: Secondary | ICD-10-CM | POA: Diagnosis not present

## 2021-11-14 DIAGNOSIS — J188 Other pneumonia, unspecified organism: Secondary | ICD-10-CM | POA: Diagnosis not present

## 2021-11-14 DIAGNOSIS — E559 Vitamin D deficiency, unspecified: Secondary | ICD-10-CM | POA: Diagnosis not present

## 2021-11-14 DIAGNOSIS — I214 Non-ST elevation (NSTEMI) myocardial infarction: Secondary | ICD-10-CM | POA: Diagnosis not present

## 2021-11-14 DIAGNOSIS — Z7982 Long term (current) use of aspirin: Secondary | ICD-10-CM | POA: Diagnosis not present

## 2021-11-14 DIAGNOSIS — N400 Enlarged prostate without lower urinary tract symptoms: Secondary | ICD-10-CM | POA: Diagnosis not present

## 2021-11-14 DIAGNOSIS — Z9181 History of falling: Secondary | ICD-10-CM | POA: Diagnosis not present

## 2021-11-14 DIAGNOSIS — R079 Chest pain, unspecified: Secondary | ICD-10-CM | POA: Diagnosis not present

## 2021-11-14 DIAGNOSIS — Z794 Long term (current) use of insulin: Secondary | ICD-10-CM | POA: Diagnosis not present

## 2021-11-14 DIAGNOSIS — Z8551 Personal history of malignant neoplasm of bladder: Secondary | ICD-10-CM | POA: Diagnosis not present

## 2021-11-14 DIAGNOSIS — Z48812 Encounter for surgical aftercare following surgery on the circulatory system: Secondary | ICD-10-CM | POA: Diagnosis not present

## 2021-11-14 DIAGNOSIS — Z7984 Long term (current) use of oral hypoglycemic drugs: Secondary | ICD-10-CM | POA: Diagnosis not present

## 2021-11-14 DIAGNOSIS — E1159 Type 2 diabetes mellitus with other circulatory complications: Secondary | ICD-10-CM | POA: Diagnosis not present

## 2021-11-14 DIAGNOSIS — I152 Hypertension secondary to endocrine disorders: Secondary | ICD-10-CM | POA: Diagnosis not present

## 2021-11-14 DIAGNOSIS — E119 Type 2 diabetes mellitus without complications: Secondary | ICD-10-CM | POA: Diagnosis not present

## 2021-11-14 DIAGNOSIS — Z951 Presence of aortocoronary bypass graft: Secondary | ICD-10-CM | POA: Diagnosis not present

## 2021-11-14 DIAGNOSIS — E1165 Type 2 diabetes mellitus with hyperglycemia: Secondary | ICD-10-CM | POA: Diagnosis not present

## 2021-11-14 DIAGNOSIS — R911 Solitary pulmonary nodule: Secondary | ICD-10-CM | POA: Diagnosis not present

## 2021-11-14 DIAGNOSIS — E1169 Type 2 diabetes mellitus with other specified complication: Secondary | ICD-10-CM | POA: Diagnosis not present

## 2021-11-16 ENCOUNTER — Other Ambulatory Visit: Payer: Self-pay | Admitting: Physician Assistant

## 2021-11-18 DIAGNOSIS — E1159 Type 2 diabetes mellitus with other circulatory complications: Secondary | ICD-10-CM | POA: Diagnosis not present

## 2021-11-18 DIAGNOSIS — I2109 ST elevation (STEMI) myocardial infarction involving other coronary artery of anterior wall: Secondary | ICD-10-CM | POA: Diagnosis not present

## 2021-11-18 DIAGNOSIS — Z8551 Personal history of malignant neoplasm of bladder: Secondary | ICD-10-CM | POA: Diagnosis not present

## 2021-11-18 DIAGNOSIS — I152 Hypertension secondary to endocrine disorders: Secondary | ICD-10-CM | POA: Diagnosis not present

## 2021-11-18 DIAGNOSIS — Z7982 Long term (current) use of aspirin: Secondary | ICD-10-CM | POA: Diagnosis not present

## 2021-11-18 DIAGNOSIS — N390 Urinary tract infection, site not specified: Secondary | ICD-10-CM | POA: Diagnosis not present

## 2021-11-18 DIAGNOSIS — I214 Non-ST elevation (NSTEMI) myocardial infarction: Secondary | ICD-10-CM | POA: Diagnosis not present

## 2021-11-18 DIAGNOSIS — R079 Chest pain, unspecified: Secondary | ICD-10-CM | POA: Diagnosis not present

## 2021-11-18 DIAGNOSIS — E1169 Type 2 diabetes mellitus with other specified complication: Secondary | ICD-10-CM | POA: Diagnosis not present

## 2021-11-18 DIAGNOSIS — E119 Type 2 diabetes mellitus without complications: Secondary | ICD-10-CM | POA: Diagnosis not present

## 2021-11-18 DIAGNOSIS — Z7902 Long term (current) use of antithrombotics/antiplatelets: Secondary | ICD-10-CM | POA: Diagnosis not present

## 2021-11-18 DIAGNOSIS — R911 Solitary pulmonary nodule: Secondary | ICD-10-CM | POA: Diagnosis not present

## 2021-11-18 DIAGNOSIS — E559 Vitamin D deficiency, unspecified: Secondary | ICD-10-CM | POA: Diagnosis not present

## 2021-11-18 DIAGNOSIS — E441 Mild protein-calorie malnutrition: Secondary | ICD-10-CM | POA: Diagnosis not present

## 2021-11-18 DIAGNOSIS — J188 Other pneumonia, unspecified organism: Secondary | ICD-10-CM | POA: Diagnosis not present

## 2021-11-18 DIAGNOSIS — Z48812 Encounter for surgical aftercare following surgery on the circulatory system: Secondary | ICD-10-CM | POA: Diagnosis not present

## 2021-11-18 DIAGNOSIS — Z794 Long term (current) use of insulin: Secondary | ICD-10-CM | POA: Diagnosis not present

## 2021-11-18 DIAGNOSIS — Z7984 Long term (current) use of oral hypoglycemic drugs: Secondary | ICD-10-CM | POA: Diagnosis not present

## 2021-11-18 DIAGNOSIS — E785 Hyperlipidemia, unspecified: Secondary | ICD-10-CM | POA: Diagnosis not present

## 2021-11-18 DIAGNOSIS — N400 Enlarged prostate without lower urinary tract symptoms: Secondary | ICD-10-CM | POA: Diagnosis not present

## 2021-11-18 DIAGNOSIS — E1165 Type 2 diabetes mellitus with hyperglycemia: Secondary | ICD-10-CM | POA: Diagnosis not present

## 2021-11-18 DIAGNOSIS — Z9181 History of falling: Secondary | ICD-10-CM | POA: Diagnosis not present

## 2021-11-18 DIAGNOSIS — Z951 Presence of aortocoronary bypass graft: Secondary | ICD-10-CM | POA: Diagnosis not present

## 2021-11-19 DIAGNOSIS — J188 Other pneumonia, unspecified organism: Secondary | ICD-10-CM | POA: Diagnosis not present

## 2021-11-19 DIAGNOSIS — E785 Hyperlipidemia, unspecified: Secondary | ICD-10-CM | POA: Diagnosis not present

## 2021-11-19 DIAGNOSIS — E1169 Type 2 diabetes mellitus with other specified complication: Secondary | ICD-10-CM | POA: Diagnosis not present

## 2021-11-19 DIAGNOSIS — R079 Chest pain, unspecified: Secondary | ICD-10-CM | POA: Diagnosis not present

## 2021-11-19 DIAGNOSIS — R911 Solitary pulmonary nodule: Secondary | ICD-10-CM | POA: Diagnosis not present

## 2021-11-19 DIAGNOSIS — E441 Mild protein-calorie malnutrition: Secondary | ICD-10-CM | POA: Diagnosis not present

## 2021-11-19 DIAGNOSIS — I152 Hypertension secondary to endocrine disorders: Secondary | ICD-10-CM | POA: Diagnosis not present

## 2021-11-19 DIAGNOSIS — Z48812 Encounter for surgical aftercare following surgery on the circulatory system: Secondary | ICD-10-CM | POA: Diagnosis not present

## 2021-11-19 DIAGNOSIS — E1159 Type 2 diabetes mellitus with other circulatory complications: Secondary | ICD-10-CM | POA: Diagnosis not present

## 2021-11-19 DIAGNOSIS — Z7982 Long term (current) use of aspirin: Secondary | ICD-10-CM | POA: Diagnosis not present

## 2021-11-19 DIAGNOSIS — Z794 Long term (current) use of insulin: Secondary | ICD-10-CM | POA: Diagnosis not present

## 2021-11-19 DIAGNOSIS — N390 Urinary tract infection, site not specified: Secondary | ICD-10-CM | POA: Diagnosis not present

## 2021-11-19 DIAGNOSIS — E119 Type 2 diabetes mellitus without complications: Secondary | ICD-10-CM | POA: Diagnosis not present

## 2021-11-19 DIAGNOSIS — E1165 Type 2 diabetes mellitus with hyperglycemia: Secondary | ICD-10-CM | POA: Diagnosis not present

## 2021-11-19 DIAGNOSIS — Z7902 Long term (current) use of antithrombotics/antiplatelets: Secondary | ICD-10-CM | POA: Diagnosis not present

## 2021-11-19 DIAGNOSIS — Z951 Presence of aortocoronary bypass graft: Secondary | ICD-10-CM | POA: Diagnosis not present

## 2021-11-19 DIAGNOSIS — I2109 ST elevation (STEMI) myocardial infarction involving other coronary artery of anterior wall: Secondary | ICD-10-CM | POA: Diagnosis not present

## 2021-11-19 DIAGNOSIS — E559 Vitamin D deficiency, unspecified: Secondary | ICD-10-CM | POA: Diagnosis not present

## 2021-11-19 DIAGNOSIS — Z8551 Personal history of malignant neoplasm of bladder: Secondary | ICD-10-CM | POA: Diagnosis not present

## 2021-11-19 DIAGNOSIS — Z9181 History of falling: Secondary | ICD-10-CM | POA: Diagnosis not present

## 2021-11-19 DIAGNOSIS — Z7984 Long term (current) use of oral hypoglycemic drugs: Secondary | ICD-10-CM | POA: Diagnosis not present

## 2021-11-19 DIAGNOSIS — I214 Non-ST elevation (NSTEMI) myocardial infarction: Secondary | ICD-10-CM | POA: Diagnosis not present

## 2021-11-19 DIAGNOSIS — N400 Enlarged prostate without lower urinary tract symptoms: Secondary | ICD-10-CM | POA: Diagnosis not present

## 2021-11-25 DIAGNOSIS — E559 Vitamin D deficiency, unspecified: Secondary | ICD-10-CM | POA: Diagnosis not present

## 2021-11-25 DIAGNOSIS — E1165 Type 2 diabetes mellitus with hyperglycemia: Secondary | ICD-10-CM | POA: Diagnosis not present

## 2021-11-25 DIAGNOSIS — Z951 Presence of aortocoronary bypass graft: Secondary | ICD-10-CM | POA: Diagnosis not present

## 2021-11-25 DIAGNOSIS — Z794 Long term (current) use of insulin: Secondary | ICD-10-CM | POA: Diagnosis not present

## 2021-11-25 DIAGNOSIS — E119 Type 2 diabetes mellitus without complications: Secondary | ICD-10-CM | POA: Diagnosis not present

## 2021-11-25 DIAGNOSIS — R911 Solitary pulmonary nodule: Secondary | ICD-10-CM | POA: Diagnosis not present

## 2021-11-25 DIAGNOSIS — E1159 Type 2 diabetes mellitus with other circulatory complications: Secondary | ICD-10-CM | POA: Diagnosis not present

## 2021-11-25 DIAGNOSIS — I152 Hypertension secondary to endocrine disorders: Secondary | ICD-10-CM | POA: Diagnosis not present

## 2021-11-25 DIAGNOSIS — E785 Hyperlipidemia, unspecified: Secondary | ICD-10-CM | POA: Diagnosis not present

## 2021-11-25 DIAGNOSIS — Z48812 Encounter for surgical aftercare following surgery on the circulatory system: Secondary | ICD-10-CM | POA: Diagnosis not present

## 2021-11-25 DIAGNOSIS — N400 Enlarged prostate without lower urinary tract symptoms: Secondary | ICD-10-CM | POA: Diagnosis not present

## 2021-11-25 DIAGNOSIS — Z7984 Long term (current) use of oral hypoglycemic drugs: Secondary | ICD-10-CM | POA: Diagnosis not present

## 2021-11-25 DIAGNOSIS — Z9181 History of falling: Secondary | ICD-10-CM | POA: Diagnosis not present

## 2021-11-25 DIAGNOSIS — Z8551 Personal history of malignant neoplasm of bladder: Secondary | ICD-10-CM | POA: Diagnosis not present

## 2021-11-25 DIAGNOSIS — Z7902 Long term (current) use of antithrombotics/antiplatelets: Secondary | ICD-10-CM | POA: Diagnosis not present

## 2021-11-25 DIAGNOSIS — E441 Mild protein-calorie malnutrition: Secondary | ICD-10-CM | POA: Diagnosis not present

## 2021-11-25 DIAGNOSIS — N390 Urinary tract infection, site not specified: Secondary | ICD-10-CM | POA: Diagnosis not present

## 2021-11-25 DIAGNOSIS — E1169 Type 2 diabetes mellitus with other specified complication: Secondary | ICD-10-CM | POA: Diagnosis not present

## 2021-11-25 DIAGNOSIS — I214 Non-ST elevation (NSTEMI) myocardial infarction: Secondary | ICD-10-CM | POA: Diagnosis not present

## 2021-11-25 DIAGNOSIS — I2109 ST elevation (STEMI) myocardial infarction involving other coronary artery of anterior wall: Secondary | ICD-10-CM | POA: Diagnosis not present

## 2021-11-25 DIAGNOSIS — J188 Other pneumonia, unspecified organism: Secondary | ICD-10-CM | POA: Diagnosis not present

## 2021-11-25 DIAGNOSIS — Z7982 Long term (current) use of aspirin: Secondary | ICD-10-CM | POA: Diagnosis not present

## 2021-11-25 DIAGNOSIS — R079 Chest pain, unspecified: Secondary | ICD-10-CM | POA: Diagnosis not present

## 2021-11-27 DIAGNOSIS — Z48812 Encounter for surgical aftercare following surgery on the circulatory system: Secondary | ICD-10-CM | POA: Diagnosis not present

## 2021-11-27 DIAGNOSIS — N390 Urinary tract infection, site not specified: Secondary | ICD-10-CM | POA: Diagnosis not present

## 2021-11-27 DIAGNOSIS — Z8551 Personal history of malignant neoplasm of bladder: Secondary | ICD-10-CM | POA: Diagnosis not present

## 2021-11-27 DIAGNOSIS — R079 Chest pain, unspecified: Secondary | ICD-10-CM | POA: Diagnosis not present

## 2021-11-27 DIAGNOSIS — E441 Mild protein-calorie malnutrition: Secondary | ICD-10-CM | POA: Diagnosis not present

## 2021-11-27 DIAGNOSIS — Z951 Presence of aortocoronary bypass graft: Secondary | ICD-10-CM | POA: Diagnosis not present

## 2021-11-27 DIAGNOSIS — Z7984 Long term (current) use of oral hypoglycemic drugs: Secondary | ICD-10-CM | POA: Diagnosis not present

## 2021-11-27 DIAGNOSIS — E785 Hyperlipidemia, unspecified: Secondary | ICD-10-CM | POA: Diagnosis not present

## 2021-11-27 DIAGNOSIS — E119 Type 2 diabetes mellitus without complications: Secondary | ICD-10-CM | POA: Diagnosis not present

## 2021-11-27 DIAGNOSIS — E1159 Type 2 diabetes mellitus with other circulatory complications: Secondary | ICD-10-CM | POA: Diagnosis not present

## 2021-11-27 DIAGNOSIS — Z7982 Long term (current) use of aspirin: Secondary | ICD-10-CM | POA: Diagnosis not present

## 2021-11-27 DIAGNOSIS — I2109 ST elevation (STEMI) myocardial infarction involving other coronary artery of anterior wall: Secondary | ICD-10-CM | POA: Diagnosis not present

## 2021-11-27 DIAGNOSIS — E559 Vitamin D deficiency, unspecified: Secondary | ICD-10-CM | POA: Diagnosis not present

## 2021-11-27 DIAGNOSIS — N400 Enlarged prostate without lower urinary tract symptoms: Secondary | ICD-10-CM | POA: Diagnosis not present

## 2021-11-27 DIAGNOSIS — Z9181 History of falling: Secondary | ICD-10-CM | POA: Diagnosis not present

## 2021-11-27 DIAGNOSIS — R911 Solitary pulmonary nodule: Secondary | ICD-10-CM | POA: Diagnosis not present

## 2021-11-27 DIAGNOSIS — Z7902 Long term (current) use of antithrombotics/antiplatelets: Secondary | ICD-10-CM | POA: Diagnosis not present

## 2021-11-27 DIAGNOSIS — J188 Other pneumonia, unspecified organism: Secondary | ICD-10-CM | POA: Diagnosis not present

## 2021-11-27 DIAGNOSIS — E1169 Type 2 diabetes mellitus with other specified complication: Secondary | ICD-10-CM | POA: Diagnosis not present

## 2021-11-27 DIAGNOSIS — Z794 Long term (current) use of insulin: Secondary | ICD-10-CM | POA: Diagnosis not present

## 2021-11-27 DIAGNOSIS — I214 Non-ST elevation (NSTEMI) myocardial infarction: Secondary | ICD-10-CM | POA: Diagnosis not present

## 2021-11-27 DIAGNOSIS — E1165 Type 2 diabetes mellitus with hyperglycemia: Secondary | ICD-10-CM | POA: Diagnosis not present

## 2021-11-27 DIAGNOSIS — I152 Hypertension secondary to endocrine disorders: Secondary | ICD-10-CM | POA: Diagnosis not present

## 2021-11-29 DIAGNOSIS — Z794 Long term (current) use of insulin: Secondary | ICD-10-CM | POA: Diagnosis not present

## 2021-11-29 DIAGNOSIS — E1165 Type 2 diabetes mellitus with hyperglycemia: Secondary | ICD-10-CM | POA: Diagnosis not present

## 2021-11-29 DIAGNOSIS — E1159 Type 2 diabetes mellitus with other circulatory complications: Secondary | ICD-10-CM | POA: Diagnosis not present

## 2021-11-29 DIAGNOSIS — Z951 Presence of aortocoronary bypass graft: Secondary | ICD-10-CM | POA: Diagnosis not present

## 2021-11-29 DIAGNOSIS — Z7984 Long term (current) use of oral hypoglycemic drugs: Secondary | ICD-10-CM | POA: Diagnosis not present

## 2021-11-29 DIAGNOSIS — N390 Urinary tract infection, site not specified: Secondary | ICD-10-CM | POA: Diagnosis not present

## 2021-11-29 DIAGNOSIS — I214 Non-ST elevation (NSTEMI) myocardial infarction: Secondary | ICD-10-CM | POA: Diagnosis not present

## 2021-11-29 DIAGNOSIS — Z7982 Long term (current) use of aspirin: Secondary | ICD-10-CM | POA: Diagnosis not present

## 2021-11-29 DIAGNOSIS — E119 Type 2 diabetes mellitus without complications: Secondary | ICD-10-CM | POA: Diagnosis not present

## 2021-11-29 DIAGNOSIS — Z7902 Long term (current) use of antithrombotics/antiplatelets: Secondary | ICD-10-CM | POA: Diagnosis not present

## 2021-11-29 DIAGNOSIS — I2109 ST elevation (STEMI) myocardial infarction involving other coronary artery of anterior wall: Secondary | ICD-10-CM | POA: Diagnosis not present

## 2021-11-29 DIAGNOSIS — Z8551 Personal history of malignant neoplasm of bladder: Secondary | ICD-10-CM | POA: Diagnosis not present

## 2021-11-29 DIAGNOSIS — Z9181 History of falling: Secondary | ICD-10-CM | POA: Diagnosis not present

## 2021-11-29 DIAGNOSIS — E441 Mild protein-calorie malnutrition: Secondary | ICD-10-CM | POA: Diagnosis not present

## 2021-11-29 DIAGNOSIS — R079 Chest pain, unspecified: Secondary | ICD-10-CM | POA: Diagnosis not present

## 2021-11-29 DIAGNOSIS — J188 Other pneumonia, unspecified organism: Secondary | ICD-10-CM | POA: Diagnosis not present

## 2021-11-29 DIAGNOSIS — E559 Vitamin D deficiency, unspecified: Secondary | ICD-10-CM | POA: Diagnosis not present

## 2021-11-29 DIAGNOSIS — E785 Hyperlipidemia, unspecified: Secondary | ICD-10-CM | POA: Diagnosis not present

## 2021-11-29 DIAGNOSIS — I152 Hypertension secondary to endocrine disorders: Secondary | ICD-10-CM | POA: Diagnosis not present

## 2021-11-29 DIAGNOSIS — E1169 Type 2 diabetes mellitus with other specified complication: Secondary | ICD-10-CM | POA: Diagnosis not present

## 2021-11-29 DIAGNOSIS — Z48812 Encounter for surgical aftercare following surgery on the circulatory system: Secondary | ICD-10-CM | POA: Diagnosis not present

## 2021-11-29 DIAGNOSIS — R911 Solitary pulmonary nodule: Secondary | ICD-10-CM | POA: Diagnosis not present

## 2021-11-29 DIAGNOSIS — N400 Enlarged prostate without lower urinary tract symptoms: Secondary | ICD-10-CM | POA: Diagnosis not present

## 2021-12-03 DIAGNOSIS — L84 Corns and callosities: Secondary | ICD-10-CM | POA: Diagnosis not present

## 2021-12-03 DIAGNOSIS — E119 Type 2 diabetes mellitus without complications: Secondary | ICD-10-CM | POA: Diagnosis not present

## 2021-12-03 DIAGNOSIS — B351 Tinea unguium: Secondary | ICD-10-CM | POA: Diagnosis not present

## 2021-12-03 DIAGNOSIS — M79676 Pain in unspecified toe(s): Secondary | ICD-10-CM | POA: Diagnosis not present

## 2021-12-03 DIAGNOSIS — E1142 Type 2 diabetes mellitus with diabetic polyneuropathy: Secondary | ICD-10-CM | POA: Diagnosis not present

## 2021-12-05 DIAGNOSIS — E119 Type 2 diabetes mellitus without complications: Secondary | ICD-10-CM | POA: Diagnosis not present

## 2021-12-10 DIAGNOSIS — R079 Chest pain, unspecified: Secondary | ICD-10-CM | POA: Diagnosis not present

## 2021-12-10 DIAGNOSIS — N390 Urinary tract infection, site not specified: Secondary | ICD-10-CM | POA: Diagnosis not present

## 2021-12-10 DIAGNOSIS — Z794 Long term (current) use of insulin: Secondary | ICD-10-CM | POA: Diagnosis not present

## 2021-12-10 DIAGNOSIS — Z7984 Long term (current) use of oral hypoglycemic drugs: Secondary | ICD-10-CM | POA: Diagnosis not present

## 2021-12-10 DIAGNOSIS — E119 Type 2 diabetes mellitus without complications: Secondary | ICD-10-CM | POA: Diagnosis not present

## 2021-12-10 DIAGNOSIS — E1165 Type 2 diabetes mellitus with hyperglycemia: Secondary | ICD-10-CM | POA: Diagnosis not present

## 2021-12-10 DIAGNOSIS — E785 Hyperlipidemia, unspecified: Secondary | ICD-10-CM | POA: Diagnosis not present

## 2021-12-10 DIAGNOSIS — E559 Vitamin D deficiency, unspecified: Secondary | ICD-10-CM | POA: Diagnosis not present

## 2021-12-10 DIAGNOSIS — Z9181 History of falling: Secondary | ICD-10-CM | POA: Diagnosis not present

## 2021-12-10 DIAGNOSIS — E1159 Type 2 diabetes mellitus with other circulatory complications: Secondary | ICD-10-CM | POA: Diagnosis not present

## 2021-12-10 DIAGNOSIS — I152 Hypertension secondary to endocrine disorders: Secondary | ICD-10-CM | POA: Diagnosis not present

## 2021-12-10 DIAGNOSIS — R911 Solitary pulmonary nodule: Secondary | ICD-10-CM | POA: Diagnosis not present

## 2021-12-10 DIAGNOSIS — E1169 Type 2 diabetes mellitus with other specified complication: Secondary | ICD-10-CM | POA: Diagnosis not present

## 2021-12-10 DIAGNOSIS — E441 Mild protein-calorie malnutrition: Secondary | ICD-10-CM | POA: Diagnosis not present

## 2021-12-10 DIAGNOSIS — N400 Enlarged prostate without lower urinary tract symptoms: Secondary | ICD-10-CM | POA: Diagnosis not present

## 2021-12-10 DIAGNOSIS — I214 Non-ST elevation (NSTEMI) myocardial infarction: Secondary | ICD-10-CM | POA: Diagnosis not present

## 2021-12-10 DIAGNOSIS — Z951 Presence of aortocoronary bypass graft: Secondary | ICD-10-CM | POA: Diagnosis not present

## 2021-12-10 DIAGNOSIS — J188 Other pneumonia, unspecified organism: Secondary | ICD-10-CM | POA: Diagnosis not present

## 2021-12-10 DIAGNOSIS — I2109 ST elevation (STEMI) myocardial infarction involving other coronary artery of anterior wall: Secondary | ICD-10-CM | POA: Diagnosis not present

## 2021-12-10 DIAGNOSIS — Z7902 Long term (current) use of antithrombotics/antiplatelets: Secondary | ICD-10-CM | POA: Diagnosis not present

## 2021-12-10 DIAGNOSIS — Z8551 Personal history of malignant neoplasm of bladder: Secondary | ICD-10-CM | POA: Diagnosis not present

## 2021-12-10 DIAGNOSIS — Z7982 Long term (current) use of aspirin: Secondary | ICD-10-CM | POA: Diagnosis not present

## 2021-12-10 DIAGNOSIS — Z48812 Encounter for surgical aftercare following surgery on the circulatory system: Secondary | ICD-10-CM | POA: Diagnosis not present

## 2021-12-11 DIAGNOSIS — I214 Non-ST elevation (NSTEMI) myocardial infarction: Secondary | ICD-10-CM | POA: Diagnosis not present

## 2021-12-11 DIAGNOSIS — N400 Enlarged prostate without lower urinary tract symptoms: Secondary | ICD-10-CM | POA: Diagnosis not present

## 2021-12-11 DIAGNOSIS — R079 Chest pain, unspecified: Secondary | ICD-10-CM | POA: Diagnosis not present

## 2021-12-11 DIAGNOSIS — Z7982 Long term (current) use of aspirin: Secondary | ICD-10-CM | POA: Diagnosis not present

## 2021-12-11 DIAGNOSIS — Z7902 Long term (current) use of antithrombotics/antiplatelets: Secondary | ICD-10-CM | POA: Diagnosis not present

## 2021-12-11 DIAGNOSIS — J188 Other pneumonia, unspecified organism: Secondary | ICD-10-CM | POA: Diagnosis not present

## 2021-12-11 DIAGNOSIS — Z8551 Personal history of malignant neoplasm of bladder: Secondary | ICD-10-CM | POA: Diagnosis not present

## 2021-12-11 DIAGNOSIS — E559 Vitamin D deficiency, unspecified: Secondary | ICD-10-CM | POA: Diagnosis not present

## 2021-12-11 DIAGNOSIS — N390 Urinary tract infection, site not specified: Secondary | ICD-10-CM | POA: Diagnosis not present

## 2021-12-11 DIAGNOSIS — E119 Type 2 diabetes mellitus without complications: Secondary | ICD-10-CM | POA: Diagnosis not present

## 2021-12-11 DIAGNOSIS — E1165 Type 2 diabetes mellitus with hyperglycemia: Secondary | ICD-10-CM | POA: Diagnosis not present

## 2021-12-11 DIAGNOSIS — E1169 Type 2 diabetes mellitus with other specified complication: Secondary | ICD-10-CM | POA: Diagnosis not present

## 2021-12-11 DIAGNOSIS — E785 Hyperlipidemia, unspecified: Secondary | ICD-10-CM | POA: Diagnosis not present

## 2021-12-11 DIAGNOSIS — Z48812 Encounter for surgical aftercare following surgery on the circulatory system: Secondary | ICD-10-CM | POA: Diagnosis not present

## 2021-12-11 DIAGNOSIS — Z951 Presence of aortocoronary bypass graft: Secondary | ICD-10-CM | POA: Diagnosis not present

## 2021-12-11 DIAGNOSIS — I152 Hypertension secondary to endocrine disorders: Secondary | ICD-10-CM | POA: Diagnosis not present

## 2021-12-11 DIAGNOSIS — Z794 Long term (current) use of insulin: Secondary | ICD-10-CM | POA: Diagnosis not present

## 2021-12-11 DIAGNOSIS — E1159 Type 2 diabetes mellitus with other circulatory complications: Secondary | ICD-10-CM | POA: Diagnosis not present

## 2021-12-11 DIAGNOSIS — Z9181 History of falling: Secondary | ICD-10-CM | POA: Diagnosis not present

## 2021-12-11 DIAGNOSIS — Z7984 Long term (current) use of oral hypoglycemic drugs: Secondary | ICD-10-CM | POA: Diagnosis not present

## 2021-12-11 DIAGNOSIS — R911 Solitary pulmonary nodule: Secondary | ICD-10-CM | POA: Diagnosis not present

## 2021-12-11 DIAGNOSIS — I2109 ST elevation (STEMI) myocardial infarction involving other coronary artery of anterior wall: Secondary | ICD-10-CM | POA: Diagnosis not present

## 2021-12-11 DIAGNOSIS — E441 Mild protein-calorie malnutrition: Secondary | ICD-10-CM | POA: Diagnosis not present

## 2021-12-14 ENCOUNTER — Other Ambulatory Visit: Payer: Self-pay | Admitting: Physician Assistant

## 2021-12-17 DIAGNOSIS — Z48812 Encounter for surgical aftercare following surgery on the circulatory system: Secondary | ICD-10-CM | POA: Diagnosis not present

## 2021-12-17 DIAGNOSIS — J188 Other pneumonia, unspecified organism: Secondary | ICD-10-CM | POA: Diagnosis not present

## 2021-12-17 DIAGNOSIS — Z7902 Long term (current) use of antithrombotics/antiplatelets: Secondary | ICD-10-CM | POA: Diagnosis not present

## 2021-12-17 DIAGNOSIS — N400 Enlarged prostate without lower urinary tract symptoms: Secondary | ICD-10-CM | POA: Diagnosis not present

## 2021-12-17 DIAGNOSIS — I2109 ST elevation (STEMI) myocardial infarction involving other coronary artery of anterior wall: Secondary | ICD-10-CM | POA: Diagnosis not present

## 2021-12-17 DIAGNOSIS — Z7982 Long term (current) use of aspirin: Secondary | ICD-10-CM | POA: Diagnosis not present

## 2021-12-17 DIAGNOSIS — Z951 Presence of aortocoronary bypass graft: Secondary | ICD-10-CM | POA: Diagnosis not present

## 2021-12-17 DIAGNOSIS — Z9181 History of falling: Secondary | ICD-10-CM | POA: Diagnosis not present

## 2021-12-17 DIAGNOSIS — N390 Urinary tract infection, site not specified: Secondary | ICD-10-CM | POA: Diagnosis not present

## 2021-12-17 DIAGNOSIS — E1169 Type 2 diabetes mellitus with other specified complication: Secondary | ICD-10-CM | POA: Diagnosis not present

## 2021-12-17 DIAGNOSIS — Z794 Long term (current) use of insulin: Secondary | ICD-10-CM | POA: Diagnosis not present

## 2021-12-17 DIAGNOSIS — R911 Solitary pulmonary nodule: Secondary | ICD-10-CM | POA: Diagnosis not present

## 2021-12-17 DIAGNOSIS — E119 Type 2 diabetes mellitus without complications: Secondary | ICD-10-CM | POA: Diagnosis not present

## 2021-12-17 DIAGNOSIS — Z7984 Long term (current) use of oral hypoglycemic drugs: Secondary | ICD-10-CM | POA: Diagnosis not present

## 2021-12-17 DIAGNOSIS — E1165 Type 2 diabetes mellitus with hyperglycemia: Secondary | ICD-10-CM | POA: Diagnosis not present

## 2021-12-17 DIAGNOSIS — I214 Non-ST elevation (NSTEMI) myocardial infarction: Secondary | ICD-10-CM | POA: Diagnosis not present

## 2021-12-17 DIAGNOSIS — I152 Hypertension secondary to endocrine disorders: Secondary | ICD-10-CM | POA: Diagnosis not present

## 2021-12-17 DIAGNOSIS — E785 Hyperlipidemia, unspecified: Secondary | ICD-10-CM | POA: Diagnosis not present

## 2021-12-17 DIAGNOSIS — E1159 Type 2 diabetes mellitus with other circulatory complications: Secondary | ICD-10-CM | POA: Diagnosis not present

## 2021-12-17 DIAGNOSIS — Z8551 Personal history of malignant neoplasm of bladder: Secondary | ICD-10-CM | POA: Diagnosis not present

## 2021-12-17 DIAGNOSIS — E441 Mild protein-calorie malnutrition: Secondary | ICD-10-CM | POA: Diagnosis not present

## 2021-12-17 DIAGNOSIS — E559 Vitamin D deficiency, unspecified: Secondary | ICD-10-CM | POA: Diagnosis not present

## 2021-12-17 DIAGNOSIS — R079 Chest pain, unspecified: Secondary | ICD-10-CM | POA: Diagnosis not present

## 2021-12-18 ENCOUNTER — Other Ambulatory Visit: Payer: Self-pay

## 2021-12-18 DIAGNOSIS — E785 Hyperlipidemia, unspecified: Secondary | ICD-10-CM

## 2021-12-18 NOTE — Progress Notes (Signed)
Cardiology Office Note   Date:  12/19/2021   ID:  JACINTO Osborne, DOB [birth year], MRN [id number]  PCP:  System, Provider Not In  Cardiologist:   Rollene Rotunda, MD   Chief Complaint  Patient presents with   Coronary Artery Disease      History of Present Illness: Robert Osborne is a 76 y.o. male who presents for follow up of CAD.    The patient had a low risk nuclear stress test at [facility] in May 2022.  He recently went to [facility] on 08/15/2021 for evaluation of chest pain.  CTA negative for PE, but noted a 5 mm right upper lobe lung nodule with repeat noncontrast CT recommended in 3 to 6 months.  Echocardiogram obtained on 08/16/2021 showed EF 55%, moderate asymmetric LVH of basal anteroseptal segment, grade 1 DD, new wall motion abnormality suggestive of mid to distal LAD disease versus possible stress-induced cardiomyopathy, trivial MR. Cardiac catheterization performed on 08/16/2021 showed EF EF 45 to 50%, 75% ostial left circumflex, 80% OM 2, 90% OM1, 25% ostial LAD, 90% D1, 90% mid LAD, 100% distal LAD occlusion, 90% RPAV, 25% mid RCA.  Culprit lesion was felt to be occlusion in the mid LAD territory, however he has heavily calcified lesion in the other vessels as well.  The lesion was successfully wired and flow restored, however a balloon could not be advanced through the heavily calcified disease more proximally.  CT surgery was consulted for consideration of CABG.  The patient ultimately underwent CABG x4 with LIMA-diagonal, reverse SVG-LAD, reverse SVG-PDA and reverse SVG-OM by Dr. Cliffton Asters on 08/22/2021.  He also required endarterectomy in the operating room of his LAD.  Postprocedure, he was started on aspirin and Plavix.  Unfortunately, after bypass surgery, cardiology service got called again on 08/23/2021 for midsternal chest pain.  Repeat EKG showed stable ST elevation in V2-V4.  Repeat echocardiogram obtained on 08/23/2021 showed EF 50 to 55%, apical akinesis, moderately  reduced RV EF, free wall hypokinesis.  Echocardiogram was reviewed by Dr. [place] who felt wall motion abnormality is unchanged when compared to the previous echo on 9/2, in reviewing the cardiac cath film, LAD was diffusely diseased and calcified, therefore he did not believe there was anything that we could offer percutaneously even if the vessel/graft went down, medical management was recommended.    Since I last saw him he has done well.  He is working with physical therapy and he is mostly limited because of leg weakness and discomfort.  He has not had any further chest discomfort.  He has not required any nitroglycerin.  He has no shortness of breath, PND or orthopnea.  He has had no palpitations, presyncope or syncope.  He has had no weight gain or edema.   Past Medical History:  Diagnosis Date   Bladder cancer (HCC) 08/18/2013   Cancer (HCC)    papillary urethral carcinoma,low grade, non-invasive   Diabetes mellitus without complication (HCC)    Erectile dysfunction 10/24/2016   Hyperlipidemia associated with type 2 diabetes mellitus (HCC) 05/17/2013   Hypertension associated with diabetes (HCC)    Hypokalemia 11/27/2017   Hypomagnesemia 11/27/2017   Major frontotemporal neurocognitive disorder, probable, with behavioral disturbance 11/27/2017   Vitamin D deficiency 05/17/2013    Past Surgical History:  Procedure Laterality Date   COLONOSCOPY W/ POLYPECTOMY     CORONARY ARTERY BYPASS GRAFT N/A 08/22/2021   Procedure: CORONARY ARTERY BYPASS GRAFTING (CABG), ON PUMP, TIMES FOUR , USING LEFT  INTERNAL MAMMARY ARTERY AND ENDOSCOPICALLY HARVESTED RIGHT GREATER SAPHENOUS VEIN;  Surgeon: Lajuana Matte, MD;  Location: [place];  Service: Open Heart Surgery;  Laterality: N/A;   CORONARY BALLOON ANGIOPLASTY N/A 08/16/2021   Procedure: CORONARY BALLOON ANGIOPLASTY;  Surgeon: Jettie Booze, MD;  Location: [place] [facility];  Service: Cardiovascular;  Laterality: N/A;   ENDOVEIN HARVEST OF  GREATER SAPHENOUS VEIN  08/22/2021   Procedure: ENDOVEIN HARVEST OF GREATER SAPHENOUS VEIN;  Surgeon: Lajuana Matte, MD;  Location: [place];  Service: Open Heart Surgery;;   LEFT HEART CATH AND CORONARY ANGIOGRAPHY N/A 08/16/2021   Procedure: LEFT HEART CATH AND CORONARY ANGIOGRAPHY;  Surgeon: Jettie Booze, MD;  Location: [place] [facility];  Service: Cardiovascular;  Laterality: N/A;   TEE WITHOUT CARDIOVERSION N/A 08/22/2021   Procedure: TRANSESOPHAGEAL ECHOCARDIOGRAM (TEE);  Surgeon: Lajuana Matte, MD;  Location: [place];  Service: Open Heart Surgery;  Laterality: N/A;     Current Outpatient Medications  Medication Sig Dispense Refill   aspirin EC 81 MG EC tablet Take 1 tablet (81 mg total) by mouth daily. Swallow whole. 30 tablet 11   blood glucose meter kit and supplies KIT Dispense based on patient and insurance preference. Use up to twice a daily as directed. (FOR ICD-10 - type 2 DM uncontrolled E11.9) 1 each 0   blood glucose meter kit and supplies Dispense based on patient and insurance preference. Use up to four times daily as directed. (FOR ICD-10 E10.9, E11.9). 1 each 0   blood glucose meter kit and supplies Use up to four times daily as directed. dx E11.9 1 each 1   Blood Glucose Monitoring Suppl (ONE TOUCH ULTRA 2) w/Device KIT USE TO CHECK BLOOD SUGAR UP TO 4 TIMES DAILY AS DIRECTED 1 kit 0   clopidogrel (PLAVIX) 75 MG tablet Take 1 tablet (75 mg total) by mouth daily. 30 tablet 3   dapagliflozin propanediol (FARXIGA) 10 MG TABS tablet Take 1 tablet (10 mg total) by mouth daily. 30 tablet 3   docusate sodium (COLACE) 100 MG capsule Take 100 mg by mouth 2 (two) times daily.     ezetimibe (ZETIA) 10 MG tablet Take 1 tablet (10 mg total) by mouth daily. 90 tablet 3   furosemide (LASIX) 40 MG tablet Take by mouth.     glucose blood test strip Use as instructed 100 each 12   insulin aspart (NOVOLOG) 100 UNIT/ML FlexPen Inject 9 Units into the skin 2 (two) times daily with  a meal. 15 mL 11   insulin degludec (TRESIBA FLEXTOUCH) 100 UNIT/ML SOPN FlexTouch Pen Inject 0.05 mLs (5 Units total) into the skin daily. (Patient taking differently: Inject 18 Units into the skin daily.) 1 pen 2   Insulin Pen Needle (PEN NEEDLES) 32G X 4 MM MISC 100 each by Does not apply route 4 (four) times daily. E10.9 100 each 5   isosorbide mononitrate (IMDUR) 60 MG 24 hr tablet Take 1 tablet (60 mg total) by mouth daily. 90 tablet 1   Lancets (ONETOUCH DELICA PLUS [id number]) MISC USE TO CHECK BLOOD SUGAR UP TO 4 TIMES A DAY AS DIRECTED 100 each 0   levofloxacin (LEVAQUIN) 500 MG tablet Take 1 tablet (500 mg total) by mouth daily. 7 tablet 0   metoprolol tartrate (LOPRESSOR) 50 MG tablet Take 1 tablet (50 mg total) by mouth 2 (two) times daily. 60 tablet 3   Multiple Vitamin (MULTIVITAMIN WITH MINERALS) TABS tablet Take 1 tablet by mouth daily.  nitroGLYCERIN (NITROSTAT) 0.4 MG SL tablet Place 0.4 mg under the tongue every 5 (five) minutes as needed for chest pain.     OLANZapine (ZYPREXA) 5 MG tablet Take 5 mg by mouth at bedtime.     oxyCODONE (OXY IR/ROXICODONE) 5 MG immediate release tablet Take 1-2 tablets (5-10 mg total) by mouth every 4 (four) hours as needed for severe pain. 30 tablet 0   rosuvastatin (CRESTOR) 40 MG tablet Take 1 tablet (40 mg total) by mouth daily. 30 tablet 3   tamsulosin (FLOMAX) 0.4 MG CAPS capsule Take 0.4 mg by mouth daily.     No current facility-administered medications for this visit.    Allergies:   Enalapril    ROS:  Please see the history of present illness.   Otherwise, review of systems are positive for none.   All other systems are reviewed and negative.    PHYSICAL EXAM: VS:  BP 116/70    Pulse 62    Ht $R'5\' 7"'CY$  (1.702 m)    Wt 152 lb (68.9 kg)    SpO2 96%    BMI 23.81 kg/m  , BMI Body mass index is 23.81 kg/m. GENERAL:  Well appearing, slightly frail NECK:  No jugular venous distention, waveform within normal limits, carotid upstroke  brisk and symmetric, no bruits, no thyromegaly LUNGS:  Clear to auscultation bilaterally CHEST:  Well healed sternotomy scar. HEART:  PMI not displaced or sustained,S1 and S2 within normal limits, no S3, no S4, no clicks, no rubs, no murmurs ABD:  Flat, positive bowel sounds normal in frequency in pitch, no bruits, no rebound, no guarding, no midline pulsatile mass, no hepatomegaly, no splenomegaly EXT:  2 plus pulses throughout, no edema, no cyanosis no clubbing    EKG:  EKG is not ordered today.   Recent Labs: 08/15/2021: B Natriuretic Peptide 31.0 08/23/2021: Magnesium 2.3 10/10/2021: ALT 18 10/21/2021: BUN 27; Creatinine, Ser 1.55; Hemoglobin 13.6; Platelets 235; Potassium 4.2; Sodium 136    Lipid Panel    Component Value Date/Time   CHOL 135 10/10/2021 0847   CHOL 125 05/17/2013 1513   TRIG 99 10/10/2021 0847   TRIG 139 02/21/2014 1632   TRIG 125 05/17/2013 1513   HDL 39 (L) 10/10/2021 0847   HDL 47 02/21/2014 1632   HDL 39 (L) 05/17/2013 1513   CHOLHDL 3.5 10/10/2021 0847   CHOLHDL 5.3 08/16/2021 0614   VLDL 18 08/16/2021 0614   LDLCALC 77 10/10/2021 0847   LDLCALC 61 02/21/2014 1632   LDLCALC 61 05/17/2013 1513      Wt Readings from Last 3 Encounters:  12/19/21 152 lb (68.9 kg)  10/21/21 150 lb (68 kg)  10/10/21 149 lb 6.4 oz (67.8 kg)      Other studies Reviewed: Additional studies/ records that were reviewed today include: Hospital records. Review of the above records demonstrates:  Please see elsewhere in the note.     ASSESSMENT AND PLAN:  CAD/CABG: He is doing relatively well recovering from his surgery.  I am going to refer him to cardiac rehab again pending.  HTN: His blood pressure is controlled and he will continue the meds as listed.  DYSLIPIDEMIA: LDL was 77 with an HDL of 39.  No change in therapy.  DM: A1c is 8.0.  He is looking for a new PCP for management of this.  He will continue the meds as listed.   Current medicines are reviewed at  length with the patient today.  The patient does not have  concerns regarding medicines.  The following changes have been made:  no change  Labs/ tests ordered today include: None  Orders Placed This Encounter  Procedures   AMB referral to cardiac rehabilitation     Disposition:   FU with me in 6 months.     Signed, Minus Breeding, MD  12/19/2021 12:50 PM    [facility]

## 2021-12-19 ENCOUNTER — Ambulatory Visit: Payer: Medicare HMO | Admitting: Cardiology

## 2021-12-19 ENCOUNTER — Encounter: Payer: Self-pay | Admitting: Cardiology

## 2021-12-19 ENCOUNTER — Other Ambulatory Visit: Payer: Self-pay

## 2021-12-19 VITALS — BP 116/70 | HR 62 | Ht 67.0 in | Wt 152.0 lb

## 2021-12-19 DIAGNOSIS — E119 Type 2 diabetes mellitus without complications: Secondary | ICD-10-CM | POA: Diagnosis not present

## 2021-12-19 DIAGNOSIS — Z794 Long term (current) use of insulin: Secondary | ICD-10-CM | POA: Diagnosis not present

## 2021-12-19 DIAGNOSIS — E785 Hyperlipidemia, unspecified: Secondary | ICD-10-CM

## 2021-12-19 DIAGNOSIS — I25709 Atherosclerosis of coronary artery bypass graft(s), unspecified, with unspecified angina pectoris: Secondary | ICD-10-CM | POA: Diagnosis not present

## 2021-12-19 NOTE — Patient Instructions (Signed)
Medication Instructions:  Your Physician recommend you continue on your current medication as directed.    *If you need a refill on your cardiac medications before your next appointment, please call your pharmacy*   Follow-Up: At [facility], you and your health needs are our priority.  As part of our continuing mission to provide you with exceptional heart care, we have created designated Provider Care Teams.  These Care Teams include your primary Cardiologist (physician) and Advanced Practice Providers (APPs -  Physician Assistants and Nurse Practitioners) who all work together to provide you with the care you need, when you need it.  We recommend signing up for the patient portal called "MyChart".  Sign up information is provided on this After Visit Summary.  MyChart is used to connect with patients for Virtual Visits (Telemedicine).  Patients are able to view lab/test results, encounter notes, upcoming appointments, etc.  Non-urgent messages can be sent to your provider as well.   To learn more about what you can do with MyChart, go to [email or web address].    Your next appointment:   6 month(s)  The format for your next appointment:   In Person  Provider:   Minus Breeding, MD    Other Instructions Cardiac rehabilitation at [facility] (order has been placed).

## 2021-12-23 DIAGNOSIS — Z8551 Personal history of malignant neoplasm of bladder: Secondary | ICD-10-CM | POA: Diagnosis not present

## 2021-12-23 DIAGNOSIS — I2109 ST elevation (STEMI) myocardial infarction involving other coronary artery of anterior wall: Secondary | ICD-10-CM | POA: Diagnosis not present

## 2021-12-23 DIAGNOSIS — Z951 Presence of aortocoronary bypass graft: Secondary | ICD-10-CM | POA: Diagnosis not present

## 2021-12-23 DIAGNOSIS — R079 Chest pain, unspecified: Secondary | ICD-10-CM | POA: Diagnosis not present

## 2021-12-23 DIAGNOSIS — N390 Urinary tract infection, site not specified: Secondary | ICD-10-CM | POA: Diagnosis not present

## 2021-12-23 DIAGNOSIS — E1169 Type 2 diabetes mellitus with other specified complication: Secondary | ICD-10-CM | POA: Diagnosis not present

## 2021-12-23 DIAGNOSIS — Z48812 Encounter for surgical aftercare following surgery on the circulatory system: Secondary | ICD-10-CM | POA: Diagnosis not present

## 2021-12-23 DIAGNOSIS — Z7982 Long term (current) use of aspirin: Secondary | ICD-10-CM | POA: Diagnosis not present

## 2021-12-23 DIAGNOSIS — E441 Mild protein-calorie malnutrition: Secondary | ICD-10-CM | POA: Diagnosis not present

## 2021-12-23 DIAGNOSIS — E1159 Type 2 diabetes mellitus with other circulatory complications: Secondary | ICD-10-CM | POA: Diagnosis not present

## 2021-12-23 DIAGNOSIS — E1165 Type 2 diabetes mellitus with hyperglycemia: Secondary | ICD-10-CM | POA: Diagnosis not present

## 2021-12-23 DIAGNOSIS — Z9181 History of falling: Secondary | ICD-10-CM | POA: Diagnosis not present

## 2021-12-23 DIAGNOSIS — Z7984 Long term (current) use of oral hypoglycemic drugs: Secondary | ICD-10-CM | POA: Diagnosis not present

## 2021-12-23 DIAGNOSIS — I214 Non-ST elevation (NSTEMI) myocardial infarction: Secondary | ICD-10-CM | POA: Diagnosis not present

## 2021-12-23 DIAGNOSIS — R911 Solitary pulmonary nodule: Secondary | ICD-10-CM | POA: Diagnosis not present

## 2021-12-23 DIAGNOSIS — N400 Enlarged prostate without lower urinary tract symptoms: Secondary | ICD-10-CM | POA: Diagnosis not present

## 2021-12-23 DIAGNOSIS — J188 Other pneumonia, unspecified organism: Secondary | ICD-10-CM | POA: Diagnosis not present

## 2021-12-23 DIAGNOSIS — E785 Hyperlipidemia, unspecified: Secondary | ICD-10-CM | POA: Diagnosis not present

## 2021-12-23 DIAGNOSIS — I152 Hypertension secondary to endocrine disorders: Secondary | ICD-10-CM | POA: Diagnosis not present

## 2021-12-23 DIAGNOSIS — Z7902 Long term (current) use of antithrombotics/antiplatelets: Secondary | ICD-10-CM | POA: Diagnosis not present

## 2021-12-23 DIAGNOSIS — E119 Type 2 diabetes mellitus without complications: Secondary | ICD-10-CM | POA: Diagnosis not present

## 2021-12-23 DIAGNOSIS — E559 Vitamin D deficiency, unspecified: Secondary | ICD-10-CM | POA: Diagnosis not present

## 2021-12-23 DIAGNOSIS — Z794 Long term (current) use of insulin: Secondary | ICD-10-CM | POA: Diagnosis not present

## 2021-12-24 DIAGNOSIS — Z794 Long term (current) use of insulin: Secondary | ICD-10-CM | POA: Diagnosis not present

## 2021-12-24 DIAGNOSIS — Z7984 Long term (current) use of oral hypoglycemic drugs: Secondary | ICD-10-CM | POA: Diagnosis not present

## 2021-12-24 DIAGNOSIS — Z8551 Personal history of malignant neoplasm of bladder: Secondary | ICD-10-CM | POA: Diagnosis not present

## 2021-12-24 DIAGNOSIS — Z9181 History of falling: Secondary | ICD-10-CM | POA: Diagnosis not present

## 2021-12-24 DIAGNOSIS — I214 Non-ST elevation (NSTEMI) myocardial infarction: Secondary | ICD-10-CM | POA: Diagnosis not present

## 2021-12-24 DIAGNOSIS — I2109 ST elevation (STEMI) myocardial infarction involving other coronary artery of anterior wall: Secondary | ICD-10-CM | POA: Diagnosis not present

## 2021-12-24 DIAGNOSIS — E1169 Type 2 diabetes mellitus with other specified complication: Secondary | ICD-10-CM | POA: Diagnosis not present

## 2021-12-24 DIAGNOSIS — R911 Solitary pulmonary nodule: Secondary | ICD-10-CM | POA: Diagnosis not present

## 2021-12-24 DIAGNOSIS — E119 Type 2 diabetes mellitus without complications: Secondary | ICD-10-CM | POA: Diagnosis not present

## 2021-12-24 DIAGNOSIS — E559 Vitamin D deficiency, unspecified: Secondary | ICD-10-CM | POA: Diagnosis not present

## 2021-12-24 DIAGNOSIS — Z7902 Long term (current) use of antithrombotics/antiplatelets: Secondary | ICD-10-CM | POA: Diagnosis not present

## 2021-12-24 DIAGNOSIS — E441 Mild protein-calorie malnutrition: Secondary | ICD-10-CM | POA: Diagnosis not present

## 2021-12-24 DIAGNOSIS — R079 Chest pain, unspecified: Secondary | ICD-10-CM | POA: Diagnosis not present

## 2021-12-24 DIAGNOSIS — Z951 Presence of aortocoronary bypass graft: Secondary | ICD-10-CM | POA: Diagnosis not present

## 2021-12-24 DIAGNOSIS — N400 Enlarged prostate without lower urinary tract symptoms: Secondary | ICD-10-CM | POA: Diagnosis not present

## 2021-12-24 DIAGNOSIS — Z48812 Encounter for surgical aftercare following surgery on the circulatory system: Secondary | ICD-10-CM | POA: Diagnosis not present

## 2021-12-24 DIAGNOSIS — I152 Hypertension secondary to endocrine disorders: Secondary | ICD-10-CM | POA: Diagnosis not present

## 2021-12-24 DIAGNOSIS — N390 Urinary tract infection, site not specified: Secondary | ICD-10-CM | POA: Diagnosis not present

## 2021-12-24 DIAGNOSIS — E1165 Type 2 diabetes mellitus with hyperglycemia: Secondary | ICD-10-CM | POA: Diagnosis not present

## 2021-12-24 DIAGNOSIS — J188 Other pneumonia, unspecified organism: Secondary | ICD-10-CM | POA: Diagnosis not present

## 2021-12-24 DIAGNOSIS — E1159 Type 2 diabetes mellitus with other circulatory complications: Secondary | ICD-10-CM | POA: Diagnosis not present

## 2021-12-24 DIAGNOSIS — Z7982 Long term (current) use of aspirin: Secondary | ICD-10-CM | POA: Diagnosis not present

## 2021-12-24 DIAGNOSIS — E785 Hyperlipidemia, unspecified: Secondary | ICD-10-CM | POA: Diagnosis not present

## 2021-12-25 ENCOUNTER — Other Ambulatory Visit: Payer: Self-pay | Admitting: Physician Assistant

## 2021-12-30 ENCOUNTER — Other Ambulatory Visit: Payer: Self-pay | Admitting: Physician Assistant

## 2022-01-01 ENCOUNTER — Other Ambulatory Visit: Payer: Self-pay | Admitting: Physician Assistant

## 2022-01-03 DIAGNOSIS — E119 Type 2 diabetes mellitus without complications: Secondary | ICD-10-CM | POA: Diagnosis not present

## 2022-01-14 ENCOUNTER — Other Ambulatory Visit: Payer: Self-pay | Admitting: Physician Assistant

## 2022-01-20 ENCOUNTER — Encounter (HOSPITAL_COMMUNITY)
Admission: RE | Admit: 2022-01-20 | Discharge: 2022-01-20 | Disposition: A | Discharge status: Home / Self Care | Payer: Medicare HMO | Source: Ambulatory Visit | Attending: Cardiology | Admitting: Cardiology

## 2022-01-20 ENCOUNTER — Encounter (HOSPITAL_COMMUNITY): Payer: Self-pay

## 2022-01-20 ENCOUNTER — Other Ambulatory Visit: Payer: Self-pay

## 2022-01-20 VITALS — BP 122/70 | HR 57 | Ht 67.0 in | Wt 152.1 lb

## 2022-01-20 DIAGNOSIS — Z79899 Other long term (current) drug therapy: Secondary | ICD-10-CM | POA: Insufficient documentation

## 2022-01-20 DIAGNOSIS — Z951 Presence of aortocoronary bypass graft: Secondary | ICD-10-CM | POA: Insufficient documentation

## 2022-01-20 NOTE — Progress Notes (Signed)
Cardiac Individual Treatment Plan  Patient Details  Name: Robert Osborne MRN: [id number] Date of Birth: 26-Oct-1946 Referring Provider:   Flowsheet Row CARDIAC REHAB PHASE II ORIENTATION from 01/20/2022 in [place]  Referring Provider Dr. Percival Spanish       Initial Encounter Date:  Flowsheet Row CARDIAC REHAB PHASE II ORIENTATION from 01/20/2022 in [place]  Date 01/20/22       Visit Diagnosis: S/P CABG x 4  Patient's Home Medications on Admission:  Current Outpatient Medications:    aspirin EC 81 MG EC tablet, Take 1 tablet (81 mg total) by mouth daily. Swallow whole., Disp: 30 tablet, Rfl: 11   blood glucose meter kit and supplies KIT, Dispense based on patient and insurance preference. Use up to twice a daily as directed. (FOR ICD-10 - type 2 DM uncontrolled E11.9), Disp: 1 each, Rfl: 0   blood glucose meter kit and supplies, Dispense based on patient and insurance preference. Use up to four times daily as directed. (FOR ICD-10 E10.9, E11.9)., Disp: 1 each, Rfl: 0   blood glucose meter kit and supplies, Use up to four times daily as directed. dx E11.9, Disp: 1 each, Rfl: 1   Blood Glucose Monitoring Suppl (ONE TOUCH ULTRA 2) w/Device KIT, USE TO CHECK BLOOD SUGAR UP TO 4 TIMES DAILY AS DIRECTED, Disp: 1 kit, Rfl: 0   clopidogrel (PLAVIX) 75 MG tablet, Take 1 tablet (75 mg total) by mouth daily., Disp: 30 tablet, Rfl: 3   dapagliflozin propanediol (FARXIGA) 10 MG TABS tablet, Take 1 tablet (10 mg total) by mouth daily., Disp: 30 tablet, Rfl: 3   ezetimibe (ZETIA) 10 MG tablet, Take 1 tablet (10 mg total) by mouth daily., Disp: 90 tablet, Rfl: 3   glucose blood test strip, Use as instructed, Disp: 100 each, Rfl: 12   insulin degludec (TRESIBA FLEXTOUCH) 100 UNIT/ML SOPN FlexTouch Pen, Inject 0.05 mLs (5 Units total) into the skin daily. (Patient taking differently: Inject 18 Units into the skin daily.), Disp: 1 pen, Rfl: 2   Insulin Pen  Needle (PEN NEEDLES) 32G X 4 MM MISC, 100 each by Does not apply route 4 (four) times daily. E10.9, Disp: 100 each, Rfl: 5   isosorbide mononitrate (IMDUR) 60 MG 24 hr tablet, Take 1 tablet (60 mg total) by mouth daily., Disp: 90 tablet, Rfl: 1   Lancets (ONETOUCH DELICA PLUS [id number]) MISC, USE TO CHECK BLOOD SUGAR UP TO 4 TIMES A DAY AS DIRECTED, Disp: 100 each, Rfl: 0   metoprolol tartrate (LOPRESSOR) 50 MG tablet, Take 1 tablet (50 mg total) by mouth 2 (two) times daily., Disp: 60 tablet, Rfl: 3   Multiple Vitamin (MULTIVITAMIN WITH MINERALS) TABS tablet, Take 1 tablet by mouth daily., Disp: , Rfl:    nitroGLYCERIN (NITROSTAT) 0.4 MG SL tablet, Place 0.4 mg under the tongue every 5 (five) minutes as needed for chest pain., Disp: , Rfl:    OLANZapine (ZYPREXA) 5 MG tablet, Take 5 mg by mouth at bedtime., Disp: , Rfl:    rosuvastatin (CRESTOR) 40 MG tablet, Take 1 tablet (40 mg total) by mouth daily., Disp: 30 tablet, Rfl: 3   tamsulosin (FLOMAX) 0.4 MG CAPS capsule, Take 0.4 mg by mouth daily., Disp: , Rfl:    insulin aspart (NOVOLOG) 100 UNIT/ML FlexPen, Inject 9 Units into the skin 2 (two) times daily with a meal. (Patient not taking: Reported on 01/20/2022), Disp: 15 mL, Rfl: 11   levofloxacin (LEVAQUIN) 500 MG tablet, Take 1  tablet (500 mg total) by mouth daily. (Patient not taking: Reported on 01/20/2022), Disp: 7 tablet, Rfl: 0   oxyCODONE (OXY IR/ROXICODONE) 5 MG immediate release tablet, Take 1-2 tablets (5-10 mg total) by mouth every 4 (four) hours as needed for severe pain. (Patient not taking: Reported on 01/20/2022), Disp: 30 tablet, Rfl: 0  Past Medical History: Past Medical History:  Diagnosis Date   Bladder cancer ([place]) 08/18/2013   Cancer ([place])    papillary urethral carcinoma,low grade, non-invasive   Diabetes mellitus without complication ([place])    Erectile dysfunction 10/24/2016   Hyperlipidemia associated with type 2 diabetes mellitus ([place]) 05/17/2013   Hypertension associated with  diabetes ([place])    Hypokalemia 11/27/2017   Hypomagnesemia 11/27/2017   Major frontotemporal neurocognitive disorder, probable, with behavioral disturbance 11/27/2017   Vitamin D deficiency 05/17/2013    Tobacco Use: Social History   Tobacco Use  Smoking Status Former   Types: Cigarettes   Quit date: 05/17/1973   Years since quitting: 48.7  Smokeless Tobacco Never    Labs: Recent Review Flowsheet Data     Labs for ITP Cardiac and Pulmonary Rehab Latest Ref Rng & Units 08/22/2021 08/22/2021 08/22/2021 08/22/2021 10/10/2021   Cholestrol 100 - 199 mg/dL - - - - 135   LDLCALC 0 - 99 mg/dL - - - - 77   HDL >39 mg/dL - - - - 39(L)   Trlycerides 0 - 149 mg/dL - - - - 99   Hemoglobin A1c 4.8 - 5.6 % - - - - -   PHART 7.350 - 7.450 - 7.352 7.362 7.389 -   PCO2ART 32.0 - 48.0 mmHg - 40.7 33.8 35.4 -   HCO3 20.0 - 28.0 mmol/L - 22.7 19.2(L) 21.3 -   TCO2 22 - 32 mmol/L 23 24 20(L) 22 -   ACIDBASEDEF 0.0 - 2.0 mmol/L - 3.0(H) 6.0(H) 3.0(H) -   O2SAT % - 94.0 91.0 89.0 -       Capillary Blood Glucose: Lab Results  Component Value Date   GLUCAP 90 08/30/2021   GLUCAP 146 (H) 08/29/2021   GLUCAP 263 (H) 08/29/2021   GLUCAP 162 (H) 08/29/2021   GLUCAP 93 08/29/2021    POCT Glucose     Row Name 01/20/22 1311             POCT Blood Glucose   Pre-Exercise 204 mg/dL                Exercise Target Goals: Exercise Program Goal: Individual exercise prescription set using results from initial 6 min walk test and THRR while considering  patients activity barriers and safety.   Exercise Prescription Goal: Starting with aerobic activity 30 plus minutes a day, 3 days per week for initial exercise prescription. Provide home exercise prescription and guidelines that participant acknowledges understanding prior to discharge.  Activity Barriers & Risk Stratification:  Activity Barriers & Cardiac Risk Stratification - 01/20/22 1256       Activity Barriers & Cardiac Risk Stratification    Activity Barriers Deconditioning;Muscular Weakness;Shortness of Breath;Balance Concerns;History of Falls    Cardiac Risk Stratification High             6 Minute Walk:  6 Minute Walk     Row Name 01/20/22 1345         6 Minute Walk   Phase Initial     Distance 550 feet     Walk Time 6 minutes     # of Rest Breaks 1  MPH 1     METS 1.12     RPE 11     VO2 Peak 3.93     Symptoms Yes (comment)     Comments one standing 20 sec rest break     Resting HR 57 bpm     Resting BP 122/70     Resting Oxygen Saturation  95 %     Exercise Oxygen Saturation  during 6 min walk 95 %     Max Ex. HR 67 bpm     Max Ex. BP 135/75     2 Minute Post BP 120/68              Oxygen Initial Assessment:   Oxygen Re-Evaluation:   Oxygen Discharge (Final Oxygen Re-Evaluation):   Initial Exercise Prescription:  Initial Exercise Prescription - 01/20/22 1300       Date of Initial Exercise RX and Referring Provider   Date 01/20/22    Referring Provider Dr. Percival Spanish    Expected Discharge Date 04/11/22      NuStep   Level 1    SPM 60    Minutes 39      Prescription Details   Frequency (times per week) 3    Duration Progress to 30 minutes of continuous aerobic without signs/symptoms of physical distress      Intensity   THRR 40-80% of Max Heartrate 57-114    Ratings of Perceived Exertion 11-13    Perceived Dyspnea 0-4      Resistance Training   Training Prescription Yes    Weight 2    Reps 10-15             Perform Capillary Blood Glucose checks as needed.  Exercise Prescription Changes:   Exercise Comments:   Exercise Goals and Review:   Exercise Goals     Row Name 01/20/22 1348             Exercise Goals   Increase Physical Activity Yes       Intervention Provide advice, education, support and counseling about physical activity/exercise needs.;Develop an individualized exercise prescription for aerobic and resistive training based on initial  evaluation findings, risk stratification, comorbidities and participant's personal goals.       Expected Outcomes Short Term: Attend rehab on a regular basis to increase amount of physical activity.;Long Term: Add in home exercise to make exercise part of routine and to increase amount of physical activity.;Long Term: Exercising regularly at least 3-5 days a week.       Increase Strength and Stamina Yes       Intervention Provide advice, education, support and counseling about physical activity/exercise needs.;Develop an individualized exercise prescription for aerobic and resistive training based on initial evaluation findings, risk stratification, comorbidities and participant's personal goals.       Expected Outcomes Short Term: Increase workloads from initial exercise prescription for resistance, speed, and METs.;Short Term: Perform resistance training exercises routinely during rehab and add in resistance training at home;Long Term: Improve cardiorespiratory fitness, muscular endurance and strength as measured by increased METs and functional capacity (6MWT)       Able to understand and use rate of perceived exertion (RPE) scale Yes       Intervention Provide education and explanation on how to use RPE scale       Expected Outcomes Short Term: Able to use RPE daily in rehab to express subjective intensity level;Long Term:  Able to use RPE to guide intensity level when exercising independently  Knowledge and understanding of Target Heart Rate Range (THRR) Yes       Intervention Provide education and explanation of THRR including how the numbers were predicted and where they are located for reference       Expected Outcomes Short Term: Able to state/look up THRR;Long Term: Able to use THRR to govern intensity when exercising independently;Short Term: Able to use daily as guideline for intensity in rehab       Able to check pulse independently Yes       Intervention Provide education and  demonstration on how to check pulse in carotid and radial arteries.;Review the importance of being able to check your own pulse for safety during independent exercise       Expected Outcomes Short Term: Able to explain why pulse checking is important during independent exercise;Long Term: Able to check pulse independently and accurately       Understanding of Exercise Prescription Yes       Intervention Provide education, explanation, and written materials on patient's individual exercise prescription       Expected Outcomes Short Term: Able to explain program exercise prescription;Long Term: Able to explain home exercise prescription to exercise independently                Exercise Goals Re-Evaluation :    Discharge Exercise Prescription (Final Exercise Prescription Changes):   Nutrition:  Target Goals: Understanding of nutrition guidelines, daily intake of sodium <1550m, cholesterol <202m calories 30% from fat and 7% or less from saturated fats, daily to have 5 or more servings of fruits and vegetables.  Biometrics:  Pre Biometrics - 01/20/22 1348       Pre Biometrics   Height _0  (1.702 m)    Weight 152 lb 1.9 oz (69 kg)    Waist Circumference 35 inches    Hip Circumference 36 inches    Waist to Hip Ratio 0.97 %    BMI (Calculated) 23.82    Triceps Skinfold 9 mm    % Body Fat 22.1 %    Grip Strength 23.7 kg    Flexibility 0 in    Single Leg Stand 0 seconds              Nutrition Therapy Plan and Nutrition Goals:  Nutrition Therapy & Goals - 01/20/22 1300       Intervention Plan   Intervention Prescribe, educate and counsel regarding individualized specific dietary modifications aiming towards targeted core components such as weight, hypertension, lipid management, diabetes, heart failure and other comorbidities.;Nutrition handout(s) given to patient.    Expected Outcomes Short Term Goal: Understand basic principles of dietary content, such as calories,  fat, sodium, cholesterol and nutrients.;Long Term Goal: Adherence to prescribed nutrition plan.             Nutrition Assessments:  Nutrition Assessments - 01/20/22 1301       MEDFICTS Scores   Pre Score 26            MEDIFICTS Score Key: ?70 Need to make dietary changes  40-70 Heart Healthy Diet ? 40 Therapeutic Level Cholesterol Diet   Picture Your Plate Scores: <4<53nhealthy dietary pattern with much room for improvement. 41-50 Dietary pattern unlikely to meet recommendations for good health and room for improvement. 51-60 More healthful dietary pattern, with some room for improvement.  >60 Healthy dietary pattern, although there may be some specific behaviors that could be improved.    Nutrition Goals Re-Evaluation:   Nutrition Goals Discharge (  Final Nutrition Goals Re-Evaluation):   Psychosocial: Target Goals: Acknowledge presence or absence of significant depression and/or stress, maximize coping skills, provide positive support system. Participant is able to verbalize types and ability to use techniques and skills needed for reducing stress and depression.  Initial Review & Psychosocial Screening:  Initial Psych Review & Screening - 01/20/22 1258       Initial Review   Current issues with Current Sleep Concerns   He has to get up to urinate frequently at night and he has a hard time going back to sleep after.     Family Dynamics   Good Support System? Yes    Comments His wife and his brother are his main support system.      Barriers   Psychosocial barriers to participate in program There are no identifiable barriers or psychosocial needs.      Screening Interventions   Interventions Encouraged to exercise    Expected Outcomes Long Term goal: The participant improves quality of Life and PHQ9 Scores as seen by post scores and/or verbalization of changes;Short Term goal: Identification and review with participant of any Quality of Life or Depression  concerns found by scoring the questionnaire.             Quality of Life Scores:  Quality of Life - 01/20/22 1349       Quality of Life   Select Quality of Life      Quality of Life Scores   Health/Function Pre 16.14 %    Socioeconomic Pre 16.75 %    Psych/Spiritual Pre 24.43 %    Family Pre 16.6 %    GLOBAL Pre 18.06 %            Scores of 19 and below usually indicate a poorer quality of life in these areas.  A difference of  2-3 points is a clinically meaningful difference.  A difference of 2-3 points in the total score of the Quality of Life Index has been associated with significant improvement in overall quality of life, self-image, physical symptoms, and general health in studies assessing change in quality of life.  PHQ-9: Recent Review Flowsheet Data     Depression screen [facility] 2/9 01/20/2022 08/29/2019 08/09/2019 07/29/2019 02/15/2019   Decreased Interest 1 0 0 0 0   Down, Depressed, Hopeless 1 0 0 0 0   PHQ - 2 Score 2 0 0 0 0   Altered sleeping 1 - - - -   Tired, decreased energy 1 - - - -   Change in appetite 0 - - - -   Feeling bad or failure about yourself  1 - - - -   Trouble concentrating 1 - - - -   Moving slowly or fidgety/restless 1 - - - -   Suicidal thoughts 0 - - - -   PHQ-9 Score 7 - - - -   Difficult doing work/chores Not difficult at all - - - -      Interpretation of Total Score  Total Score Depression Severity:  1-4 = Minimal depression, 5-9 = Mild depression, 10-14 = Moderate depression, 15-19 = Moderately severe depression, 20-27 = Severe depression   Psychosocial Evaluation and Intervention:  Psychosocial Evaluation - 01/20/22 1402       Psychosocial Evaluation & Interventions   Interventions Stress management education;Relaxation education;Encouraged to exercise with the program and follow exercise prescription    Comments Pt has no barriers to participating in rehab. He has no identifiable psychosocial  issues. He does report some  disturbances with his sleep due to having to get up frequently during the night to urinate. He then has a hard time falling back to sleep afterwards. He admits to being severely deconditioned and he has a significant amount of leg weakness which has led to activity intolerance. He no longer has the strength to do yardwork anymore, which used to be one of his hobbies. Due to all of this, he scored a 7 on his PHQ-9. He reports that he copes well and has a good support system with his wife and brother. His goals while in the program are to improve his strength/stamina and be able to do yard work again. He is only doing the program twice per week due to his $40 copay. He is eager to start the program and to improve himself.    Expected Outcomes Pt will continue to have no identifiable psychosocial issues.    Continue Psychosocial Services  No Follow up required             Psychosocial Re-Evaluation:   Psychosocial Discharge (Final Psychosocial Re-Evaluation):   Vocational Rehabilitation: Provide vocational rehab assistance to qualifying candidates.   Vocational Rehab Evaluation & Intervention:  Vocational Rehab - 01/20/22 1305       Initial Vocational Rehab Evaluation & Intervention   Assessment shows need for Vocational Rehabilitation No   He is retired and not interested in returning to work            Education: Education Goals: Education classes will be provided on a weekly basis, covering required topics. Participant will state understanding/return demonstration of topics presented.  Learning Barriers/Preferences:  Learning Barriers/Preferences - 01/20/22 1302       Learning Barriers/Preferences   Learning Barriers Sight    Learning Preferences Skilled Demonstration;Individual Instruction;Audio;Video             Education Topics: Hypertension, Hypertension Reduction -Define heart disease and high blood pressure. Discus how high blood pressure affects the body and  ways to reduce high blood pressure.   Exercise and Your Heart -Discuss why it is important to exercise, the FITT principles of exercise, normal and abnormal responses to exercise, and how to exercise safely.   Angina -Discuss definition of angina, causes of angina, treatment of angina, and how to decrease risk of having angina.   Cardiac Medications -Review what the following cardiac medications are used for, how they affect the body, and side effects that may occur when taking the medications.  Medications include Aspirin, Beta blockers, calcium channel blockers, ACE Inhibitors, angiotensin receptor blockers, diuretics, digoxin, and antihyperlipidemics.   Congestive Heart Failure -Discuss the definition of CHF, how to live with CHF, the signs and symptoms of CHF, and how keep track of weight and sodium intake.   Heart Disease and Intimacy -Discus the effect sexual activity has on the heart, how changes occur during intimacy as we age, and safety during sexual activity.   Smoking Cessation / COPD -Discuss different methods to quit smoking, the health benefits of quitting smoking, and the definition of COPD.   Nutrition I: Fats -Discuss the types of cholesterol, what cholesterol does to the heart, and how cholesterol levels can be controlled.   Nutrition II: Labels -Discuss the different components of food labels and how to read food label   Heart Parts/Heart Disease and PAD -Discuss the anatomy of the heart, the pathway of blood circulation through the heart, and these are affected by heart disease.  Stress I: Signs and Symptoms -Discuss the causes of stress, how stress may lead to anxiety and depression, and ways to limit stress.   Stress II: Relaxation -Discuss different types of relaxation techniques to limit stress.   Warning Signs of Stroke / TIA -Discuss definition of a stroke, what the signs and symptoms are of a stroke, and how to identify when someone is  having stroke.   Knowledge Questionnaire Score:  Knowledge Questionnaire Score - 01/20/22 1303       Knowledge Questionnaire Score   Pre Score 20/24             Core Components/Risk Factors/Patient Goals at Admission:  Personal Goals and Risk Factors at Admission - 01/20/22 1308       Core Components/Risk Factors/Patient Goals on Admission   Improve shortness of breath with ADL's Yes    Intervention Provide education, individualized exercise plan and daily activity instruction to help decrease symptoms of SOB with activities of daily living.    Expected Outcomes Short Term: Improve cardiorespiratory fitness to achieve a reduction of symptoms when performing ADLs;Long Term: Be able to perform more ADLs without symptoms or delay the onset of symptoms    Diabetes Yes    Intervention Provide education about signs/symptoms and action to take for hypo/hyperglycemia.;Provide education about proper nutrition, including hydration, and aerobic/resistive exercise prescription along with prescribed medications to achieve blood glucose in normal ranges: Fasting glucose 65-99 mg/dL    Expected Outcomes Short Term: Participant verbalizes understanding of the signs/symptoms and immediate care of hyper/hypoglycemia, proper foot care and importance of medication, aerobic/resistive exercise and nutrition plan for blood glucose control.;Long Term: Attainment of HbA1C < 7%.    Personal Goal Other Yes    Personal Goal Improve strength and stamina    Intervention Attend cardiac rehab and begin a home exercise program.    Expected Outcomes Pt will improve strength and stamina.             Core Components/Risk Factors/Patient Goals Review:    Core Components/Risk Factors/Patient Goals at Discharge (Final Review):    ITP Comments:   Comments: Patient arrived for 1st visit/orientation/education at 1230. Patient was referred to CR by Dr. Percival Spanish due to S/P CABG x 4 (Z95.1). During orientation  advised patient on arrival and appointment times what to wear, what to do before, during and after exercise. Reviewed attendance and class policy.  Pt is scheduled to return Cardiac Rehab on 01/24/2022 at 1100. Pt was advised to come to class 15 minutes before class starts.  Discussed RPE/Dpysnea scales. Patient participated in warm up stretches. Patient was able to complete 6 minute walk test.  Telemetry: Sinus bradycardia/NSR. Patient was measured for the equipment. Discussed equipment safety with patient. Took patient pre-anthropometric measurements. Patient finished visit at 1400.

## 2022-01-24 ENCOUNTER — Encounter (HOSPITAL_COMMUNITY)
Admission: RE | Admit: 2022-01-24 | Discharge: 2022-01-24 | Disposition: A | Discharge status: Home / Self Care | Payer: Medicare HMO | Source: Ambulatory Visit | Attending: Cardiology | Admitting: Cardiology

## 2022-01-24 DIAGNOSIS — Z951 Presence of aortocoronary bypass graft: Secondary | ICD-10-CM

## 2022-01-24 DIAGNOSIS — Z79899 Other long term (current) drug therapy: Secondary | ICD-10-CM | POA: Diagnosis not present

## 2022-01-24 NOTE — Progress Notes (Signed)
Daily Session Note  Patient Details  Name: Robert Osborne MRN: [id number] Date of Birth: 1946-05-28 Referring Provider:   Flowsheet Row CARDIAC REHAB PHASE II ORIENTATION from 01/20/2022 in [place]  Referring Provider Dr. Percival Spanish       Encounter Date: 01/24/2022  Check In:  Session Check In - 01/24/22 1100       Check-In   Supervising physician immediately available to respond to emergencies [facility] MD immediately available    Physician(s) Dr Marlou Porch    Location [facility]    Staff Present Aundra Dubin, RN, Madlyn Frankel, RN, Bjorn Loser, MS, ACSM-CEP, Exercise Physiologist;Heather Zigmund Shany Marinez, Exercise Physiologist    Virtual Visit No    Medication changes reported     No    Fall or balance concerns reported    Yes    Comments He has fallen twice this year. He is severely deconditioned and his legs are weak which causes them to give out.    Tobacco Cessation No Change    Warm-up and Cool-down Performed as group-led instruction    Resistance Training Performed No    VAD Patient? No      Pain Assessment   Currently in Pain? No/denies    Multiple Pain Sites No             Capillary Blood Glucose: No results found for this or any previous visit (from the past 24 hour(s)).    Social History   Tobacco Use  Smoking Status Former   Types: Cigarettes   Quit date: 05/17/1973   Years since quitting: 48.7  Smokeless Tobacco Never    Goals Met:  Independence with exercise equipment Exercise tolerated well No report of concerns or symptoms today  Goals Unmet:  Not Applicable  Comments: checkout 1200   Dr. Carlyle Dolly is Medical Director for [place]

## 2022-01-27 ENCOUNTER — Encounter (HOSPITAL_COMMUNITY)
Admission: RE | Admit: 2022-01-27 | Discharge: 2022-01-27 | Disposition: A | Discharge status: Home / Self Care | Payer: Medicare HMO | Source: Ambulatory Visit | Attending: Cardiology | Admitting: Cardiology

## 2022-01-27 VITALS — Wt 153.7 lb

## 2022-01-27 DIAGNOSIS — Z79899 Other long term (current) drug therapy: Secondary | ICD-10-CM | POA: Diagnosis not present

## 2022-01-27 DIAGNOSIS — Z951 Presence of aortocoronary bypass graft: Secondary | ICD-10-CM | POA: Diagnosis not present

## 2022-01-27 NOTE — Progress Notes (Signed)
Daily Session Note  Patient Details  Name: Robert Osborne MRN: [id number] Date of Birth: 04/24/1946 Referring Provider:   Flowsheet Row CARDIAC REHAB PHASE II ORIENTATION from 01/20/2022 in [place]  Referring Provider Dr. Percival Spanish       Encounter Date: 01/27/2022  Check In:  Session Check In - 01/27/22 1100       Check-In   Supervising physician immediately available to respond to emergencies [facility] MD immediately available    Physician(s) Dr Harl Bowie    Location [facility]    Staff Present Geanie Cooley, RN;Danny Russella Dar, RN, Bjorn Loser, MS, ACSM-CEP, Exercise Physiologist;Heather Zigmund Shahzaib Azevedo, Exercise Physiologist    Virtual Visit No    Medication changes reported     No    Fall or balance concerns reported    Yes    Comments He has fallen twice this year. He is severely deconditioned and his legs are weak which causes them to give out.    Tobacco Cessation No Change    Warm-up and Cool-down Performed as group-led instruction    Resistance Training Performed No    VAD Patient? No    PAD/SET Patient? No      Pain Assessment   Currently in Pain? No/denies    Multiple Pain Sites No             Capillary Blood Glucose: No results found for this or any previous visit (from the past 24 hour(s)).    Social History   Tobacco Use  Smoking Status Former   Types: Cigarettes   Quit date: 05/17/1973   Years since quitting: 48.7  Smokeless Tobacco Never    Goals Met:  Independence with exercise equipment Exercise tolerated well No report of concerns or symptoms today  Goals Unmet:  Not Applicable  Comments: checkin 1200    Dr. Carlyle Dolly is Medical Director for [facility]

## 2022-01-31 ENCOUNTER — Encounter (HOSPITAL_COMMUNITY)
Admission: RE | Admit: 2022-01-31 | Discharge: 2022-01-31 | Disposition: A | Discharge status: Home / Self Care | Payer: Medicare HMO | Source: Ambulatory Visit | Attending: Cardiology | Admitting: Cardiology

## 2022-01-31 DIAGNOSIS — Z79899 Other long term (current) drug therapy: Secondary | ICD-10-CM | POA: Diagnosis not present

## 2022-01-31 DIAGNOSIS — Z951 Presence of aortocoronary bypass graft: Secondary | ICD-10-CM | POA: Diagnosis not present

## 2022-01-31 NOTE — Progress Notes (Signed)
Daily Session Note  Patient Details  Name: TRAMPAS STETTNER MRN: [id number] Date of Birth: July 16, 1946 Referring Provider:   Flowsheet Row CARDIAC REHAB PHASE II ORIENTATION from 01/20/2022 in [place]  Referring Provider Dr. Percival Spanish       Encounter Date: 01/31/2022  Check In:  Session Check In - 01/31/22 1100       Check-In   Supervising physician immediately available to respond to emergencies [facility] MD immediately available    Physician(s) Dr. Harrington Challenger    Location [facility]    Staff Present Geanie Cooley, RN;Dalton Kris Mouton, MS, ACSM-CEP, Exercise Physiologist;Heather Zigmund Daniel, Exercise Physiologist    Virtual Visit No    Medication changes reported     No    Fall or balance concerns reported    Yes    Comments He has fallen twice this year. He is severely deconditioned and his legs are weak which causes them to give out.    Tobacco Cessation No Change    Warm-up and Cool-down Performed as group-led instruction    Resistance Training Performed Yes    VAD Patient? No    PAD/SET Patient? No      Pain Assessment   Currently in Pain? No/denies    Multiple Pain Sites No             Capillary Blood Glucose: No results found for this or any previous visit (from the past 24 hour(s)).    Social History   Tobacco Use  Smoking Status Former   Types: Cigarettes   Quit date: 05/17/1973   Years since quitting: 48.7  Smokeless Tobacco Never    Goals Met:  Independence with exercise equipment Exercise tolerated well No report of concerns or symptoms today Strength training completed today  Goals Unmet:  Not Applicable  Comments: check out @ 12:00 pm   Dr. Carlyle Dolly is Medical Director for [facility]

## 2022-02-03 ENCOUNTER — Encounter (HOSPITAL_COMMUNITY)
Admission: RE | Admit: 2022-02-03 | Discharge: 2022-02-03 | Disposition: A | Discharge status: Home / Self Care | Payer: Medicare HMO | Source: Ambulatory Visit | Attending: Cardiology | Admitting: Cardiology

## 2022-02-03 DIAGNOSIS — Z951 Presence of aortocoronary bypass graft: Secondary | ICD-10-CM | POA: Diagnosis not present

## 2022-02-03 DIAGNOSIS — E119 Type 2 diabetes mellitus without complications: Secondary | ICD-10-CM | POA: Diagnosis not present

## 2022-02-03 DIAGNOSIS — Z79899 Other long term (current) drug therapy: Secondary | ICD-10-CM | POA: Diagnosis not present

## 2022-02-03 NOTE — Progress Notes (Signed)
Daily Session Note  Patient Details  Name: Robert Osborne MRN: [id number] Date of Birth: 1946-04-14 Referring Provider:   Flowsheet Row CARDIAC REHAB PHASE II ORIENTATION from 01/20/2022 in [place]  Referring Provider Dr. Percival Spanish       Encounter Date: 02/03/2022  Check In:  Session Check In - 02/03/22 1100       Check-In   Supervising physician immediately available to respond to emergencies [facility] MD immediately available    Physician(s) Dr. Harl Bowie    Staff Present Hoy Register, MS, ACSM-CEP, Exercise Physiologist;Heather Otho Ket, BS, Exercise Physiologist;Ameliana Brashear Wynetta Emery, RN, BSN    Virtual Visit No    Medication changes reported     No    Fall or balance concerns reported    Yes    Comments He has fallen twice this year. He is severely deconditioned and his legs are weak which causes them to give out.    Tobacco Cessation No Change    Warm-up and Cool-down Performed as group-led instruction    Resistance Training Performed Yes    VAD Patient? No    PAD/SET Patient? No      Pain Assessment   Currently in Pain? No/denies    Multiple Pain Sites No             Capillary Blood Glucose: No results found for this or any previous visit (from the past 24 hour(s)).    Social History   Tobacco Use  Smoking Status Former   Types: Cigarettes   Quit date: 05/17/1973   Years since quitting: 48.7  Smokeless Tobacco Never    Goals Met:  Independence with exercise equipment Exercise tolerated well No report of concerns or symptoms today Strength training completed today  Goals Unmet:  Not Applicable  Comments: Check out 1200.   Dr. Carlyle Dolly is Medical Director for [facility]

## 2022-02-07 ENCOUNTER — Encounter (HOSPITAL_COMMUNITY)
Admission: RE | Admit: 2022-02-07 | Discharge: 2022-02-07 | Disposition: A | Discharge status: Home / Self Care | Payer: Medicare HMO | Source: Ambulatory Visit | Attending: Cardiology | Admitting: Cardiology

## 2022-02-07 ENCOUNTER — Other Ambulatory Visit (HOSPITAL_COMMUNITY)
Admission: RE | Admit: 2022-02-07 | Discharge: 2022-02-07 | Disposition: A | Discharge status: Home / Self Care | Payer: Medicare HMO | Source: Ambulatory Visit | Attending: Physician Assistant | Admitting: Physician Assistant

## 2022-02-07 DIAGNOSIS — Z79899 Other long term (current) drug therapy: Secondary | ICD-10-CM | POA: Diagnosis not present

## 2022-02-07 DIAGNOSIS — Z951 Presence of aortocoronary bypass graft: Secondary | ICD-10-CM

## 2022-02-07 DIAGNOSIS — E785 Hyperlipidemia, unspecified: Secondary | ICD-10-CM | POA: Insufficient documentation

## 2022-02-07 LAB — LIPID PANEL
Cholesterol: 85 mg/dL (ref 0–200)
HDL: 38 mg/dL — ABNORMAL LOW (ref >40)
LDL Cholesterol: 33 mg/dL (ref 0–99)
Total CHOL/HDL Ratio: 2.2 RATIO
Triglycerides: 71 mg/dL (ref <150)
VLDL: 14 mg/dL (ref 0–40)

## 2022-02-07 LAB — HEPATIC FUNCTION PANEL
ALT: 28 U/L (ref 0–44)
AST: 30 U/L (ref 15–41)
Albumin: 3.9 g/dL (ref 3.5–5.0)
Alkaline Phosphatase: 94 U/L (ref 38–126)
Bilirubin, Direct: 0.2 mg/dL (ref 0.0–0.2)
Indirect Bilirubin: 0.7 mg/dL (ref 0.3–0.9)
Total Bilirubin: 0.9 mg/dL (ref 0.3–1.2)
Total Protein: 8.1 g/dL (ref 6.5–8.1)

## 2022-02-07 NOTE — Progress Notes (Signed)
Daily Session Note  Patient Details  Name: Robert Osborne MRN: [id number] Date of Birth: Oct 02, 1946 Referring Provider:   Flowsheet Row CARDIAC REHAB PHASE II ORIENTATION from 01/20/2022 in [place]  Referring Provider Dr. Percival Spanish       Encounter Date: 02/07/2022  Check In:  Session Check In - 02/07/22 1100       Check-In   Supervising physician immediately available to respond to emergencies [facility] MD immediately available    Physician(s) Dr. Domenic Polite    Location [facility]    Staff Present Redge Gainer, BS, Exercise Physiologist;Johnita Palleschi Wynetta Emery, RN, BSN;Other   Benay Pillow   Virtual Visit No    Medication changes reported     No    Fall or balance concerns reported    Yes    Comments He has fallen twice this year. He is severely deconditioned and his legs are weak which causes them to give out.    Tobacco Cessation No Change    Warm-up and Cool-down Performed as group-led instruction    Resistance Training Performed Yes    VAD Patient? No    PAD/SET Patient? No      Pain Assessment   Currently in Pain? No/denies    Multiple Pain Sites No             Capillary Blood Glucose: No results found for this or any previous visit (from the past 24 hour(s)).    Social History   Tobacco Use  Smoking Status Former   Types: Cigarettes   Quit date: 05/17/1973   Years since quitting: 48.7  Smokeless Tobacco Never    Goals Met:  Independence with exercise equipment Exercise tolerated well No report of concerns or symptoms today Strength training completed today  Goals Unmet:  Not Applicable  Comments: Check out 1200.   Dr. Carlyle Dolly is Medical Director for [facility]

## 2022-02-10 ENCOUNTER — Emergency Department (HOSPITAL_COMMUNITY)
Admission: EM | Admit: 2022-02-10 | Discharge: 2022-02-10 | Disposition: A | Discharge status: Home / Self Care | Payer: Medicare HMO | Attending: Emergency Medicine | Admitting: Emergency Medicine

## 2022-02-10 ENCOUNTER — Encounter (HOSPITAL_COMMUNITY)
Admission: RE | Admit: 2022-02-10 | Discharge: 2022-02-10 | Disposition: A | Discharge status: Home / Self Care | Payer: Medicare HMO | Source: Ambulatory Visit | Attending: Cardiology | Admitting: Cardiology

## 2022-02-10 ENCOUNTER — Encounter (HOSPITAL_COMMUNITY): Payer: Self-pay

## 2022-02-10 ENCOUNTER — Other Ambulatory Visit: Payer: Self-pay

## 2022-02-10 DIAGNOSIS — I2510 Atherosclerotic heart disease of native coronary artery without angina pectoris: Secondary | ICD-10-CM | POA: Insufficient documentation

## 2022-02-10 DIAGNOSIS — J069 Acute upper respiratory infection, unspecified: Secondary | ICD-10-CM | POA: Insufficient documentation

## 2022-02-10 DIAGNOSIS — Z7982 Long term (current) use of aspirin: Secondary | ICD-10-CM | POA: Diagnosis not present

## 2022-02-10 DIAGNOSIS — Z794 Long term (current) use of insulin: Secondary | ICD-10-CM | POA: Diagnosis not present

## 2022-02-10 DIAGNOSIS — Z951 Presence of aortocoronary bypass graft: Secondary | ICD-10-CM | POA: Insufficient documentation

## 2022-02-10 DIAGNOSIS — R531 Weakness: Secondary | ICD-10-CM | POA: Diagnosis not present

## 2022-02-10 DIAGNOSIS — Z7902 Long term (current) use of antithrombotics/antiplatelets: Secondary | ICD-10-CM | POA: Insufficient documentation

## 2022-02-10 DIAGNOSIS — Z20822 Contact with and (suspected) exposure to covid-19: Secondary | ICD-10-CM | POA: Diagnosis not present

## 2022-02-10 DIAGNOSIS — R0602 Shortness of breath: Secondary | ICD-10-CM | POA: Diagnosis not present

## 2022-02-10 DIAGNOSIS — E119 Type 2 diabetes mellitus without complications: Secondary | ICD-10-CM | POA: Diagnosis not present

## 2022-02-10 DIAGNOSIS — B9789 Other viral agents as the cause of diseases classified elsewhere: Secondary | ICD-10-CM | POA: Diagnosis not present

## 2022-02-10 DIAGNOSIS — R059 Cough, unspecified: Secondary | ICD-10-CM | POA: Diagnosis not present

## 2022-02-10 LAB — RESP PANEL BY RT-PCR (FLU A&B, COVID) ARPGX2
Influenza A by PCR: NEGATIVE
Influenza B by PCR: NEGATIVE
SARS Coronavirus 2 by RT PCR: NEGATIVE

## 2022-02-10 NOTE — Progress Notes (Signed)
Incomplete Session Note  Patient Details  Name: Robert Osborne MRN: [id number] Date of Birth: [birth year] Referring Provider:   Flowsheet Row CARDIAC REHAB PHASE II ORIENTATION from 01/20/2022 in [place]  Referring Provider Dr. Cleotilde Neer did not complete his rehab session.  Pt reported that he started experiencing respiratory symptoms over the weekend accompanied by generalized weakness and a fever. He reports trying to go to Urgent care on Sunday but they were closed. He was sent home by staff due to COVID-19 related concerns. He is still reporting generalized weakness and respiratory symptoms. He states that he did not take his temperature this morning. I encouraged his wife to take him to Urgent care, as her reports still feeling poorly. I instructed them to at the least take a COVID-19 test.

## 2022-02-10 NOTE — Discharge Instructions (Addendum)
You may use Tylenol cold and flu for your symptoms.  Do not take normal Tylenol with this.  If you prefer, DayQuil and NyQuil are good options instead of the Tylenol Cold and flu.  Avoid Mucinex and Sudafed for the time being due to your hypertension.  Follow-up on your results via the online portal.  Return with any chest pain, difficulty breathing or worsening symptoms.

## 2022-02-10 NOTE — ED Triage Notes (Signed)
Pt presents to ED with complaints of cough, runny nose, congestion. Pt went to Urgent Care but was sent here due to concern his cough was more cardiac related

## 2022-02-10 NOTE — ED Provider Notes (Cosign Needed)
[facility] EMERGENCY DEPARTMENT Provider Note   CSN: [id number] Arrival date & time: 02/10/22  1535     History  Chief Complaint  Patient presents with   Cough    Robert Osborne is a 76 y.o. male with a past medical history of diabetes, hyperlipidemia, MI, CAD presenting today with complaint of URI symptoms since Friday.  He reports that he has been having watering eyes, nasal congestion, occasional cough and leg weakness throughout this time.  No known sick contacts.  No history of DVT, recent surgery or travel.  Denies any objective fevers but says he has felt chills and occasionally diaphoresis.  Denies any chest pain or difficulty breathing.  Has been taking Tylenol and vitamin C for his symptoms.     Home Medications Prior to Admission medications   Medication Sig Start Date End Date Taking? Authorizing Provider  aspirin EC 81 MG EC tablet Take 1 tablet (81 mg total) by mouth daily. Swallow whole. 08/30/21   Barrett, Erin R, PA-C  blood glucose meter kit and supplies KIT Dispense based on patient and insurance preference. Use up to twice a daily as directed. (FOR ICD-10 - type 2 DM uncontrolled E11.9) 08/19/19   Johnson, Clanford L, MD  blood glucose meter kit and supplies Dispense based on patient and insurance preference. Use up to four times daily as directed. (FOR ICD-10 E10.9, E11.9). 08/23/19   Evelina Dun A, FNP  blood glucose meter kit and supplies Use up to four times daily as directed. dx E11.9 08/23/19   Terald Sleeper, PA-C  Blood Glucose Monitoring Suppl (ONE TOUCH ULTRA 2) w/Device KIT USE TO CHECK BLOOD SUGAR UP TO 4 TIMES DAILY AS DIRECTED 05/15/20   Evelina Dun A, FNP  clopidogrel (PLAVIX) 75 MG tablet Take 1 tablet (75 mg total) by mouth daily. 08/30/21   Barrett, Erin R, PA-C  dapagliflozin propanediol (FARXIGA) 10 MG TABS tablet Take 1 tablet (10 mg total) by mouth daily. 08/30/21   Barrett, Erin R, PA-C  ezetimibe (ZETIA) 10 MG tablet Take 1 tablet (10 mg total) by  mouth daily. 10/17/21 01/20/22  Almyra Deforest, PA  glucose blood test strip Use as instructed 05/15/20   Evelina Dun A, FNP  insulin aspart (NOVOLOG) 100 UNIT/ML FlexPen Inject 9 Units into the skin 2 (two) times daily with a meal. Patient not taking: Reported on 01/20/2022 01/10/20   Evelina Dun A, FNP  insulin degludec (TRESIBA FLEXTOUCH) 100 UNIT/ML SOPN FlexTouch Pen Inject 0.05 mLs (5 Units total) into the skin daily. Patient taking differently: Inject 18 Units into the skin daily. 08/10/19   Evelina Dun A, FNP  Insulin Pen Needle (PEN NEEDLES) 32G X 4 MM MISC 100 each by Does not apply route 4 (four) times daily. E10.9 05/16/20   Evelina Dun A, FNP  isosorbide mononitrate (IMDUR) 60 MG 24 hr tablet Take 1 tablet (60 mg total) by mouth daily. 09/10/21   Almyra Deforest, PA  Lancets (ONETOUCH DELICA PLUS [id number]) MISC USE TO CHECK BLOOD SUGAR UP TO 4 TIMES A DAY AS DIRECTED 01/23/20   Evelina Dun A, FNP  levofloxacin (LEVAQUIN) 500 MG tablet Take 1 tablet (500 mg total) by mouth daily. Patient not taking: Reported on 01/20/2022 10/21/21   Blue, Soijett A, PA-C  metoprolol tartrate (LOPRESSOR) 50 MG tablet Take 1 tablet (50 mg total) by mouth 2 (two) times daily. 08/30/21   Barrett, Erin R, PA-C  Multiple Vitamin (MULTIVITAMIN WITH MINERALS) TABS tablet Take 1 tablet by  mouth daily.    [provider]  nitroGLYCERIN (NITROSTAT) 0.4 MG SL tablet Place 0.4 mg under the tongue every 5 (five) minutes as needed for chest pain.    [provider]  OLANZapine (ZYPREXA) 5 MG tablet Take 5 mg by mouth at bedtime.    [provider]  oxyCODONE (OXY IR/ROXICODONE) 5 MG immediate release tablet Take 1-2 tablets (5-10 mg total) by mouth every 4 (four) hours as needed for severe pain. Patient not taking: Reported on 01/20/2022 09/06/21   Lajuana Matte, MD  rosuvastatin (CRESTOR) 40 MG tablet Take 1 tablet (40 mg total) by mouth daily. 08/30/21   Barrett, Erin R, PA-C  tamsulosin (FLOMAX)  0.4 MG CAPS capsule Take 0.4 mg by mouth daily.    [provider]      Allergies    Enalapril    Review of Systems   Review of Systems  Respiratory:  Positive for cough.   See HPI Physical Exam Updated Vital Signs BP 134/82 (BP Location: Right Arm)    Pulse 72    Temp 97.9 F (36.6 C) (Oral)    Resp 18    Ht $R'5\' 7"'Sl$  (1.702 m)    Wt 68 kg    SpO2 98%    BMI 23.49 kg/m  Physical Exam Vitals and nursing note reviewed.  Constitutional:      General: He is not in acute distress.    Appearance: Normal appearance. He is not ill-appearing.  HENT:     Head: Normocephalic and atraumatic.     Nose: Nose normal.     Mouth/Throat:     Mouth: Mucous membranes are moist.     Pharynx: Oropharynx is clear. Posterior oropharyngeal erythema present. No oropharyngeal exudate.  Eyes:     General: No scleral icterus.    Conjunctiva/sclera: Conjunctivae normal.  Cardiovascular:     Rate and Rhythm: Normal rate and regular rhythm.  Pulmonary:     Effort: Pulmonary effort is normal. No respiratory distress.     Breath sounds: No wheezing.  Skin:    General: Skin is warm and dry.     Findings: No rash.  Neurological:     Mental Status: He is alert.  Psychiatric:        Mood and Affect: Mood normal.    ED Results / Procedures / Treatments   Labs (all labs ordered are listed, but only abnormal results are displayed) Labs Reviewed  RESP PANEL BY RT-PCR (FLU A&B, COVID) [id number]    EKG None  Radiology No results found.  Procedures Procedures    Medications Ordered in ED Medications - No data to display  ED Course/ Medical Decision Making/ A&P                           Medical Decision Making  76 year old male presenting today with URI symptoms.  He was sent from urgent care due to his history of MI and CABG.  On my initial evaluation, patient is with rhinorrhea and watery eyes.  He denies chest pain or difficulty breathing.  Only says that he is having nasal congestion,  itching eyes and lower extremity weakness.  I do not believe this is a cardiac problem.  Lung sounds clear, cardiac auscultation within normal limits.  Do not believe chest x-ray is needed at this time.  Work-up: COVID and flu were both negative.  Disposition/MDM: I do not believe patient needs a full cardiac work-up  today.  He likely has a viral URI.  He will be discharged home with over-the-counter recommendations such as Tylenol Cold and flu.  He was instructed that he should not take normal Tylenol with this.  He and his wife voiced understanding.  Return precautions were discussed.  He ambulated safely out of the department.   Final Clinical Impression(s) / ED Diagnoses Final diagnoses:  Viral URI with cough    Rx / DC Orders Results and diagnoses were explained to the patient. Return precautions discussed in full. Patient had no additional questions and expressed complete understanding.   This chart was dictated using voice recognition software.  Despite best efforts to proofread,  errors can occur which can change the documentation meaning.    Rhae Hammock, [place] 02/10/22 1813

## 2022-02-12 ENCOUNTER — Other Ambulatory Visit: Payer: Self-pay | Admitting: Physician Assistant

## 2022-02-12 NOTE — Progress Notes (Signed)
Cardiac Individual Treatment Plan  Patient Details  Name: Robert Osborne MRN: [id number] Date of Birth: 1946-01-10 Referring Provider:   Flowsheet Row CARDIAC REHAB PHASE II ORIENTATION from 01/20/2022 in [employer] [facility]  Referring Provider Dr. Percival Spanish       Initial Encounter Date:  Flowsheet Row CARDIAC REHAB PHASE II ORIENTATION from 01/20/2022 in [facility]  Date 01/20/22       Visit Diagnosis: S/P CABG x 4  Patient's Home Medications on Admission:  Current Outpatient Medications:    aspirin EC 81 MG EC tablet, Take 1 tablet (81 mg total) by mouth daily. Swallow whole., Disp: 30 tablet, Rfl: 11   blood glucose meter kit and supplies KIT, Dispense based on patient and insurance preference. Use up to twice a daily as directed. (FOR ICD-10 - type 2 DM uncontrolled E11.9), Disp: 1 each, Rfl: 0   blood glucose meter kit and supplies, Dispense based on patient and insurance preference. Use up to four times daily as directed. (FOR ICD-10 E10.9, E11.9)., Disp: 1 each, Rfl: 0   blood glucose meter kit and supplies, Use up to four times daily as directed. dx E11.9, Disp: 1 each, Rfl: 1   Blood Glucose Monitoring Suppl (ONE TOUCH ULTRA 2) w/Device KIT, USE TO CHECK BLOOD SUGAR UP TO 4 TIMES DAILY AS DIRECTED, Disp: 1 kit, Rfl: 0   clopidogrel (PLAVIX) 75 MG tablet, Take 1 tablet (75 mg total) by mouth daily., Disp: 30 tablet, Rfl: 3   dapagliflozin propanediol (FARXIGA) 10 MG TABS tablet, Take 1 tablet (10 mg total) by mouth daily., Disp: 30 tablet, Rfl: 3   ezetimibe (ZETIA) 10 MG tablet, Take 1 tablet (10 mg total) by mouth daily., Disp: 90 tablet, Rfl: 3   glucose blood test strip, Use as instructed, Disp: 100 each, Rfl: 12   insulin aspart (NOVOLOG) 100 UNIT/ML FlexPen, Inject 9 Units into the skin 2 (two) times daily with a meal. (Patient not taking: Reported on 01/20/2022), Disp: 15 mL, Rfl: 11   insulin degludec (TRESIBA FLEXTOUCH) 100 UNIT/ML SOPN  FlexTouch Pen, Inject 0.05 mLs (5 Units total) into the skin daily. (Patient taking differently: Inject 18 Units into the skin daily.), Disp: 1 pen, Rfl: 2   Insulin Pen Needle (PEN NEEDLES) 32G X 4 MM MISC, 100 each by Does not apply route 4 (four) times daily. E10.9, Disp: 100 each, Rfl: 5   isosorbide mononitrate (IMDUR) 60 MG 24 hr tablet, Take 1 tablet (60 mg total) by mouth daily., Disp: 90 tablet, Rfl: 1   Lancets (ONETOUCH DELICA PLUS [id number]) MISC, USE TO CHECK BLOOD SUGAR UP TO 4 TIMES A DAY AS DIRECTED, Disp: 100 each, Rfl: 0   levofloxacin (LEVAQUIN) 500 MG tablet, Take 1 tablet (500 mg total) by mouth daily. (Patient not taking: Reported on 01/20/2022), Disp: 7 tablet, Rfl: 0   metoprolol tartrate (LOPRESSOR) 50 MG tablet, Take 1 tablet (50 mg total) by mouth 2 (two) times daily., Disp: 60 tablet, Rfl: 3   Multiple Vitamin (MULTIVITAMIN WITH MINERALS) TABS tablet, Take 1 tablet by mouth daily., Disp: , Rfl:    nitroGLYCERIN (NITROSTAT) 0.4 MG SL tablet, Place 0.4 mg under the tongue every 5 (five) minutes as needed for chest pain., Disp: , Rfl:    OLANZapine (ZYPREXA) 5 MG tablet, Take 5 mg by mouth at bedtime., Disp: , Rfl:    oxyCODONE (OXY IR/ROXICODONE) 5 MG immediate release tablet, Take 1-2 tablets (5-10 mg total) by mouth every 4 (four)  hours as needed for severe pain. (Patient not taking: Reported on 01/20/2022), Disp: 30 tablet, Rfl: 0   rosuvastatin (CRESTOR) 40 MG tablet, Take 1 tablet (40 mg total) by mouth daily., Disp: 30 tablet, Rfl: 3   tamsulosin (FLOMAX) 0.4 MG CAPS capsule, Take 0.4 mg by mouth daily., Disp: , Rfl:   Past Medical History: Past Medical History:  Diagnosis Date   Bladder cancer ([place]) 08/18/2013   Cancer ([place])    papillary urethral carcinoma,low grade, non-invasive   Diabetes mellitus without complication ([place])    Erectile dysfunction 10/24/2016   Hyperlipidemia associated with type 2 diabetes mellitus ([place]) 05/17/2013   Hypertension associated with  diabetes ([place])    Hypokalemia 11/27/2017   Hypomagnesemia 11/27/2017   Major frontotemporal neurocognitive disorder, probable, with behavioral disturbance 11/27/2017   Vitamin D deficiency 05/17/2013    Tobacco Use: Social History   Tobacco Use  Smoking Status Former   Types: Cigarettes   Quit date: 05/17/1973   Years since quitting: 48.7  Smokeless Tobacco Never    Labs: Recent Review Flowsheet Data     Labs for ITP Cardiac and Pulmonary Rehab Latest Ref Rng & Units 08/22/2021 08/22/2021 08/22/2021 10/10/2021 02/07/2022   Cholestrol 0 - 200 mg/dL - - - 135 85   LDLCALC 0 - 99 mg/dL - - - 77 33   HDL >40 mg/dL - - - 39(L) 38(L)   Trlycerides <150 mg/dL - - - 99 71   Hemoglobin A1c 4.8 - 5.6 % - - - - -   PHART 7.350 - 7.450 7.352 7.362 7.389 - -   PCO2ART 32.0 - 48.0 mmHg 40.7 33.8 35.4 - -   HCO3 20.0 - 28.0 mmol/L 22.7 19.2(L) 21.3 - -   TCO2 22 - 32 mmol/L 24 20(L) 22 - -   ACIDBASEDEF 0.0 - 2.0 mmol/L 3.0(H) 6.0(H) 3.0(H) - -   O2SAT % 94.0 91.0 89.0 - -       Capillary Blood Glucose: Lab Results  Component Value Date   GLUCAP 90 08/30/2021   GLUCAP 146 (H) 08/29/2021   GLUCAP 263 (H) 08/29/2021   GLUCAP 162 (H) 08/29/2021   GLUCAP 93 08/29/2021    POCT Glucose     Row Name 01/20/22 1311             POCT Blood Glucose   Pre-Exercise 204 mg/dL                Exercise Target Goals: Exercise Program Goal: Individual exercise prescription set using results from initial 6 min walk test and THRR while considering  patients activity barriers and safety.   Exercise Prescription Goal: Starting with aerobic activity 30 plus minutes a day, 3 days per week for initial exercise prescription. Provide home exercise prescription and guidelines that participant acknowledges understanding prior to discharge.  Activity Barriers & Risk Stratification:  Activity Barriers & Cardiac Risk Stratification - 01/20/22 1256       Activity Barriers & Cardiac Risk Stratification    Activity Barriers Deconditioning;Muscular Weakness;Shortness of Breath;Balance Concerns;History of Falls    Cardiac Risk Stratification High             6 Minute Walk:  6 Minute Walk     Row Name 01/20/22 1345         6 Minute Walk   Phase Initial     Distance 550 feet     Walk Time 6 minutes     # of Rest Breaks 1  MPH 1     METS 1.12     RPE 11     VO2 Peak 3.93     Symptoms Yes (comment)     Comments one standing 20 sec rest break     Resting HR 57 bpm     Resting BP 122/70     Resting Oxygen Saturation  95 %     Exercise Oxygen Saturation  during 6 min walk 95 %     Max Ex. HR 67 bpm     Max Ex. BP 135/75     2 Minute Post BP 120/68              Oxygen Initial Assessment:   Oxygen Re-Evaluation:   Oxygen Discharge (Final Oxygen Re-Evaluation):   Initial Exercise Prescription:  Initial Exercise Prescription - 01/20/22 1300       Date of Initial Exercise RX and Referring Provider   Date 01/20/22    Referring Provider Dr. Percival Spanish    Expected Discharge Date 04/11/22      NuStep   Level 1    SPM 60    Minutes 39      Prescription Details   Frequency (times per week) 3    Duration Progress to 30 minutes of continuous aerobic without signs/symptoms of physical distress      Intensity   THRR 40-80% of Max Heartrate 57-114    Ratings of Perceived Exertion 11-13    Perceived Dyspnea 0-4      Resistance Training   Training Prescription Yes    Weight 2    Reps 10-15             Perform Capillary Blood Glucose checks as needed.  Exercise Prescription Changes:   Exercise Prescription Changes     Row Name 01/27/22 1400 02/07/22 1301           Response to Exercise   Blood Pressure (Admit) 124/70 128/70      Blood Pressure (Exercise) 110/64 116/64      Blood Pressure (Exit) 116/62 100/62      Heart Rate (Admit) 57 bpm 66 bpm      Heart Rate (Exercise) 71 bpm 80 bpm      Heart Rate (Exit) 62 bpm 74 bpm      Rating of  Perceived Exertion (Exercise) 13 10      Duration Continue with 30 min of aerobic exercise without signs/symptoms of physical distress. Continue with 30 min of aerobic exercise without signs/symptoms of physical distress.      Intensity THRR unchanged THRR unchanged        Progression   Progression Continue to progress workloads to maintain intensity without signs/symptoms of physical distress. Continue to progress workloads to maintain intensity without signs/symptoms of physical distress.        Resistance Training   Training Prescription Yes Yes      Weight 2 2      Reps 10-15 10-15      Time 10 Minutes 10 Minutes        NuStep   Level 1 1      SPM 43 47      Minutes 39 39      METs 1.47 1.47               Exercise Comments:   Exercise Goals and Review:   Exercise Goals     Row Name 01/20/22 1348 02/10/22 1302  Exercise Goals   Increase Physical Activity Yes Yes      Intervention Provide advice, education, support and counseling about physical activity/exercise needs.;Develop an individualized exercise prescription for aerobic and resistive training based on initial evaluation findings, risk stratification, comorbidities and participant's personal goals. Provide advice, education, support and counseling about physical activity/exercise needs.;Develop an individualized exercise prescription for aerobic and resistive training based on initial evaluation findings, risk stratification, comorbidities and participant's personal goals.      Expected Outcomes Short Term: Attend rehab on a regular basis to increase amount of physical activity.;Long Term: Add in home exercise to make exercise part of routine and to increase amount of physical activity.;Long Term: Exercising regularly at least 3-5 days a week. Short Term: Attend rehab on a regular basis to increase amount of physical activity.;Long Term: Add in home exercise to make exercise part of routine and to increase  amount of physical activity.;Long Term: Exercising regularly at least 3-5 days a week.      Increase Strength and Stamina Yes Yes      Intervention Provide advice, education, support and counseling about physical activity/exercise needs.;Develop an individualized exercise prescription for aerobic and resistive training based on initial evaluation findings, risk stratification, comorbidities and participant's personal goals. Provide advice, education, support and counseling about physical activity/exercise needs.;Develop an individualized exercise prescription for aerobic and resistive training based on initial evaluation findings, risk stratification, comorbidities and participant's personal goals.      Expected Outcomes Short Term: Increase workloads from initial exercise prescription for resistance, speed, and METs.;Short Term: Perform resistance training exercises routinely during rehab and add in resistance training at home;Long Term: Improve cardiorespiratory fitness, muscular endurance and strength as measured by increased METs and functional capacity (6MWT) Short Term: Increase workloads from initial exercise prescription for resistance, speed, and METs.;Short Term: Perform resistance training exercises routinely during rehab and add in resistance training at home;Long Term: Improve cardiorespiratory fitness, muscular endurance and strength as measured by increased METs and functional capacity (6MWT)      Able to understand and use rate of perceived exertion (RPE) scale Yes Yes      Intervention Provide education and explanation on how to use RPE scale Provide education and explanation on how to use RPE scale      Expected Outcomes Short Term: Able to use RPE daily in rehab to express subjective intensity level;Long Term:  Able to use RPE to guide intensity level when exercising independently Short Term: Able to use RPE daily in rehab to express subjective intensity level;Long Term:  Able to use RPE to  guide intensity level when exercising independently      Knowledge and understanding of Target Heart Rate Range (THRR) Yes Yes      Intervention Provide education and explanation of THRR including how the numbers were predicted and where they are located for reference Provide education and explanation of THRR including how the numbers were predicted and where they are located for reference      Expected Outcomes Short Term: Able to state/look up THRR;Long Term: Able to use THRR to govern intensity when exercising independently;Short Term: Able to use daily as guideline for intensity in rehab Short Term: Able to state/look up THRR;Long Term: Able to use THRR to govern intensity when exercising independently;Short Term: Able to use daily as guideline for intensity in rehab      Able to check pulse independently Yes Yes      Intervention Provide education and demonstration on how to check  pulse in carotid and radial arteries.;Review the importance of being able to check your own pulse for safety during independent exercise Provide education and demonstration on how to check pulse in carotid and radial arteries.;Review the importance of being able to check your own pulse for safety during independent exercise      Expected Outcomes Short Term: Able to explain why pulse checking is important during independent exercise;Long Term: Able to check pulse independently and accurately Short Term: Able to explain why pulse checking is important during independent exercise;Long Term: Able to check pulse independently and accurately      Understanding of Exercise Prescription Yes Yes      Intervention Provide education, explanation, and written materials on patient's individual exercise prescription Provide education, explanation, and written materials on patient's individual exercise prescription      Expected Outcomes Short Term: Able to explain program exercise prescription;Long Term: Able to explain home exercise  prescription to exercise independently Short Term: Able to explain program exercise prescription;Long Term: Able to explain home exercise prescription to exercise independently               Exercise Goals Re-Evaluation :  Exercise Goals Re-Evaluation     [place] Name 02/10/22 1303             Exercise Goal Re-Evaluation   Exercise Goals Review Increase Physical Activity;Increase Strength and Stamina;Able to understand and use rate of perceived exertion (RPE) scale;Knowledge and understanding of Target Heart Rate Range (THRR);Able to check pulse independently;Understanding of Exercise Prescription       Comments Pt has completed 6 sessions of cardiac rehab. He is severely deconditioned and progress will be slow. He is only coming twice per week. He has been hard to motivate. He SPM on the stepper are in the 40s and he rates his exertion as very light. We will motivate him to get his steps into the 60s to make it harder. He will do this for a few seconds and then go back to the slower pace. He was sent home from rehab today due to having respiratory symptoms and generalized weakness. He is currently exercising at 1.47 METs. Will continue to monitor and progress as able.       Expected Outcomes Through exercise at rehab and at home, the patient will meet their stated goals.                 Discharge Exercise Prescription (Final Exercise Prescription Changes):  Exercise Prescription Changes - 02/07/22 1301       Response to Exercise   Blood Pressure (Admit) 128/70    Blood Pressure (Exercise) 116/64    Blood Pressure (Exit) 100/62    Heart Rate (Admit) 66 bpm    Heart Rate (Exercise) 80 bpm    Heart Rate (Exit) 74 bpm    Rating of Perceived Exertion (Exercise) 10    Duration Continue with 30 min of aerobic exercise without signs/symptoms of physical distress.    Intensity THRR unchanged      Progression   Progression Continue to progress workloads to maintain intensity without  signs/symptoms of physical distress.      Resistance Training   Training Prescription Yes    Weight 2    Reps 10-15    Time 10 Minutes      NuStep   Level 1    SPM 47    Minutes 39    METs 1.47  Nutrition:  Target Goals: Understanding of nutrition guidelines, daily intake of sodium <1526m, cholesterol <2010m calories 30% from fat and 7% or less from saturated fats, daily to have 5 or more servings of fruits and vegetables.  Biometrics:  Pre Biometrics - 01/20/22 1348       Pre Biometrics   Height 5' 7" (1.702 m)    Weight 69 kg    Waist Circumference 35 inches    Hip Circumference 36 inches    Waist to Hip Ratio 0.97 %    BMI (Calculated) 23.82    Triceps Skinfold 9 mm    % Body Fat 22.1 %    Grip Strength 23.7 kg    Flexibility 0 in    Single Leg Stand 0 seconds              Nutrition Therapy Plan and Nutrition Goals:  Nutrition Therapy & Goals - 02/03/22 0732       Personal Nutrition Goals   Comments Patient scored 26 on his diet assessment. We offer 2 educational sessions on  heart healthy nutrition with handouts and assistance with RD referral if patient is interested.      Intervention Plan   Intervention Nutrition handout(s) given to patient.    Expected Outcomes Short Term Goal: Understand basic principles of dietary content, such as calories, fat, sodium, cholesterol and nutrients.             Nutrition Assessments:  Nutrition Assessments - 01/20/22 1301       MEDFICTS Scores   Pre Score 26            MEDIFICTS Score Key: ?70 Need to make dietary changes  40-70 Heart Healthy Diet ? 40 Therapeutic Level Cholesterol Diet   Picture Your Plate Scores: <4<53nhealthy dietary pattern with much room for improvement. 41-50 Dietary pattern unlikely to meet recommendations for good health and room for improvement. 51-60 More healthful dietary pattern, with some room for improvement.  >60 Healthy dietary pattern, although  there may be some specific behaviors that could be improved.    Nutrition Goals Re-Evaluation:   Nutrition Goals Discharge (Final Nutrition Goals Re-Evaluation):   Psychosocial: Target Goals: Acknowledge presence or absence of significant depression and/or stress, maximize coping skills, provide positive support system. Participant is able to verbalize types and ability to use techniques and skills needed for reducing stress and depression.  Initial Review & Psychosocial Screening:  Initial Psych Review & Screening - 01/20/22 1258       Initial Review   Current issues with Current Sleep Concerns   He has to get up to urinate frequently at night and he has a hard time going back to sleep after.     Family Dynamics   Good Support System? Yes    Comments His wife and his brother are his main support system.      Barriers   Psychosocial barriers to participate in program There are no identifiable barriers or psychosocial needs.      Screening Interventions   Interventions Encouraged to exercise    Expected Outcomes Long Term goal: The participant improves quality of Life and PHQ9 Scores as seen by post scores and/or verbalization of changes;Short Term goal: Identification and review with participant of any Quality of Life or Depression concerns found by scoring the questionnaire.             Quality of Life Scores:  Quality of Life - 01/20/22 1349  Quality of Life   Select Quality of Life      Quality of Life Scores   Health/Function Pre 16.14 %    Socioeconomic Pre 16.75 %    Psych/Spiritual Pre 24.43 %    Family Pre 16.6 %    GLOBAL Pre 18.06 %            Scores of 19 and below usually indicate a poorer quality of life in these areas.  A difference of  2-3 points is a clinically meaningful difference.  A difference of 2-3 points in the total score of the Quality of Life Index has been associated with significant improvement in overall quality of life,  self-image, physical symptoms, and general health in studies assessing change in quality of life.  PHQ-9: Recent Review Flowsheet Data     Depression screen [facility] 2/9 01/20/2022 08/29/2019 08/09/2019 07/29/2019 02/15/2019   Decreased Interest 1 0 0 0 0   Down, Depressed, Hopeless 1 0 0 0 0   PHQ - 2 Score 2 0 0 0 0   Altered sleeping 1 - - - -   Tired, decreased energy 1 - - - -   Change in appetite 0 - - - -   Feeling bad or failure about yourself  1 - - - -   Trouble concentrating 1 - - - -   Moving slowly or fidgety/restless 1 - - - -   Suicidal thoughts 0 - - - -   PHQ-9 Score 7 - - - -   Difficult doing work/chores Not difficult at all - - - -      Interpretation of Total Score  Total Score Depression Severity:  1-4 = Minimal depression, 5-9 = Mild depression, 10-14 = Moderate depression, 15-19 = Moderately severe depression, 20-27 = Severe depression   Psychosocial Evaluation and Intervention:  Psychosocial Evaluation - 01/20/22 1402       Psychosocial Evaluation & Interventions   Interventions Stress management education;Relaxation education;Encouraged to exercise with the program and follow exercise prescription    Comments Pt has no barriers to participating in rehab. He has no identifiable psychosocial issues. He does report some disturbances with his sleep due to having to get up frequently during the night to urinate. He then has a hard time falling back to sleep afterwards. He admits to being severely deconditioned and he has a significant amount of leg weakness which has led to activity intolerance. He no longer has the strength to do yardwork anymore, which used to be one of his hobbies. Due to all of this, he scored a 7 on his PHQ-9. He reports that he copes well and has a good support system with his wife and brother. His goals while in the program are to improve his strength/stamina and be able to do yard work again. He is only doing the program twice per week due to his $40  copay. He is eager to start the program and to improve himself.    Expected Outcomes Pt will continue to have no identifiable psychosocial issues.    Continue Psychosocial Services  No Follow up required             Psychosocial Re-Evaluation:  Psychosocial Re-Evaluation     [place] Name 02/03/22 [id number]             Psychosocial Re-Evaluation   Current issues with Current Sleep Concerns       Comments Patient is new to the program completing 4 sessions. He  continues to have no psychosocial barriers identified.  He is currently taking Zyprexa qhs. He seems to enjoy coming to the sessions and demonstrates an interest in improving his health. We will continue to monitor.       Expected Outcomes Patient will continue to have no psychosocial barriers identified.       Interventions Encouraged to attend Cardiac Rehabilitation for the exercise;Relaxation education;Stress management education       Continue Psychosocial Services  No Follow up required                Psychosocial Discharge (Final Psychosocial Re-Evaluation):  Psychosocial Re-Evaluation - 02/03/22 0734       Psychosocial Re-Evaluation   Current issues with Current Sleep Concerns    Comments Patient is new to the program completing 4 sessions. He continues to have no psychosocial barriers identified.  He is currently taking Zyprexa qhs. He seems to enjoy coming to the sessions and demonstrates an interest in improving his health. We will continue to monitor.    Expected Outcomes Patient will continue to have no psychosocial barriers identified.    Interventions Encouraged to attend Cardiac Rehabilitation for the exercise;Relaxation education;Stress management education    Continue Psychosocial Services  No Follow up required             Vocational Rehabilitation: Provide vocational rehab assistance to qualifying candidates.   Vocational Rehab Evaluation & Intervention:  Vocational Rehab - 01/20/22 1305        Initial Vocational Rehab Evaluation & Intervention   Assessment shows need for Vocational Rehabilitation No   He is retired and not interested in returning to work            Education: Education Goals: Education classes will be provided on a weekly basis, covering required topics. Participant will state understanding/return demonstration of topics presented.  Learning Barriers/Preferences:  Learning Barriers/Preferences - 01/20/22 1302       Learning Barriers/Preferences   Learning Barriers Sight    Learning Preferences Skilled Demonstration;Individual Instruction;Audio;Video             Education Topics: Hypertension, Hypertension Reduction -Define heart disease and high blood pressure. Discus how high blood pressure affects the body and ways to reduce high blood pressure.   Exercise and Your Heart -Discuss why it is important to exercise, the FITT principles of exercise, normal and abnormal responses to exercise, and how to exercise safely.   Angina -Discuss definition of angina, causes of angina, treatment of angina, and how to decrease risk of having angina.   Cardiac Medications -Review what the following cardiac medications are used for, how they affect the body, and side effects that may occur when taking the medications.  Medications include Aspirin, Beta blockers, calcium channel blockers, ACE Inhibitors, angiotensin receptor blockers, diuretics, digoxin, and antihyperlipidemics.   Congestive Heart Failure -Discuss the definition of CHF, how to live with CHF, the signs and symptoms of CHF, and how keep track of weight and sodium intake.   Heart Disease and Intimacy -Discus the effect sexual activity has on the heart, how changes occur during intimacy as we age, and safety during sexual activity.   Smoking Cessation / COPD -Discuss different methods to quit smoking, the health benefits of quitting smoking, and the definition of COPD.   Nutrition I:  Fats -Discuss the types of cholesterol, what cholesterol does to the heart, and how cholesterol levels can be controlled.   Nutrition II: Labels -Discuss the different components of food labels and  how to read food label   Heart Parts/Heart Disease and PAD -Discuss the anatomy of the heart, the pathway of blood circulation through the heart, and these are affected by heart disease.   Stress I: Signs and Symptoms -Discuss the causes of stress, how stress may lead to anxiety and depression, and ways to limit stress.   Stress II: Relaxation -Discuss different types of relaxation techniques to limit stress.   Warning Signs of Stroke / TIA -Discuss definition of a stroke, what the signs and symptoms are of a stroke, and how to identify when someone is having stroke.   Knowledge Questionnaire Score:  Knowledge Questionnaire Score - 01/20/22 1303       Knowledge Questionnaire Score   Pre Score 20/24             Core Components/Risk Factors/Patient Goals at Admission:  Personal Goals and Risk Factors at Admission - 01/20/22 1308       Core Components/Risk Factors/Patient Goals on Admission   Improve shortness of breath with ADL's Yes    Intervention Provide education, individualized exercise plan and daily activity instruction to help decrease symptoms of SOB with activities of daily living.    Expected Outcomes Short Term: Improve cardiorespiratory fitness to achieve a reduction of symptoms when performing ADLs;Long Term: Be able to perform more ADLs without symptoms or delay the onset of symptoms    Diabetes Yes    Intervention Provide education about signs/symptoms and action to take for hypo/hyperglycemia.;Provide education about proper nutrition, including hydration, and aerobic/resistive exercise prescription along with prescribed medications to achieve blood glucose in normal ranges: Fasting glucose 65-99 mg/dL    Expected Outcomes Short Term: Participant verbalizes  understanding of the signs/symptoms and immediate care of hyper/hypoglycemia, proper foot care and importance of medication, aerobic/resistive exercise and nutrition plan for blood glucose control.;Long Term: Attainment of HbA1C < 7%.    Personal Goal Other Yes    Personal Goal Improve strength and stamina    Intervention Attend cardiac rehab and begin a home exercise program.    Expected Outcomes Pt will improve strength and stamina.             Core Components/Risk Factors/Patient Goals Review:   Goals and Risk Factor Review     Row Name 02/03/22 [phone]             Core Components/Risk Factors/Patient Goals Review   Personal Goals Review Weight Management/Obesity;Diabetes;Improve shortness of breath with ADL's;Other       Review Patient was referred to CR with CABGx4. He has multiple risk factors for CAD and is participating in the program for risk modification. He has completed 4 sessions. His currentl weight is 153.8 lbs and his initial weight was 152.0 lbs. His blood pressures are at goals. His personal goals for the program are to improve his strength and stamina and be able to do his yardwork again. We will continue to monitor his progress as he works towards meeting these goals.       Expected Outcomes Patient will complete the program meeting both personal and program goals.                Core Components/Risk Factors/Patient Goals at Discharge (Final Review):   Goals and Risk Factor Review - 02/03/22 0738       Core Components/Risk Factors/Patient Goals Review   Personal Goals Review Weight Management/Obesity;Diabetes;Improve shortness of breath with ADL's;Other    Review Patient was referred to CR with CABGx4. He has  multiple risk factors for CAD and is participating in the program for risk modification. He has completed 4 sessions. His currentl weight is 153.8 lbs and his initial weight was 152.0 lbs. His blood pressures are at goals. His personal goals for the program  are to improve his strength and stamina and be able to do his yardwork again. We will continue to monitor his progress as he works towards meeting these goals.    Expected Outcomes Patient will complete the program meeting both personal and program goals.             ITP Comments:   Comments: ITP REVIEW Pt is making expected progress toward Cardiac Rehab goals after completing 7 sessions. Recommend continued exercise, life style modification, education, and increased stamina and strength.

## 2022-02-14 ENCOUNTER — Encounter (HOSPITAL_COMMUNITY): Payer: Medicare HMO

## 2022-02-17 ENCOUNTER — Encounter (HOSPITAL_COMMUNITY): Payer: Medicare HMO

## 2022-02-18 NOTE — Progress Notes (Signed)
Discharge Progress Report  Patient Details  Name: Robert Osborne MRN: [id number] Date of Birth: 09/16/1946 Referring Provider:   Flowsheet Row CARDIAC REHAB PHASE II ORIENTATION from 01/20/2022 in [place]  Referring Provider Dr. Percival Spanish        Number of Visits: 7  Reason for Discharge:  Early Exit:  Personal  Smoking History:  Social History   Tobacco Use  Smoking Status Former   Types: Cigarettes   Quit date: 05/17/1973   Years since quitting: 48.7  Smokeless Tobacco Never    Diagnosis:  S/P CABG x 4  ADL UCSD:   Initial Exercise Prescription:  Initial Exercise Prescription - 01/20/22 1300       Date of Initial Exercise RX and Referring Provider   Date 01/20/22    Referring Provider Dr. Percival Spanish    Expected Discharge Date 04/11/22      NuStep   Level 1    SPM 60    Minutes 39      Prescription Details   Frequency (times per week) 3    Duration Progress to 30 minutes of continuous aerobic without signs/symptoms of physical distress      Intensity   THRR 40-80% of Max Heartrate 57-114    Ratings of Perceived Exertion 11-13    Perceived Dyspnea 0-4      Resistance Training   Training Prescription Yes    Weight 2    Reps 10-15             Discharge Exercise Prescription (Final Exercise Prescription Changes):  Exercise Prescription Changes - 02/07/22 1301       Response to Exercise   Blood Pressure (Admit) 128/70    Blood Pressure (Exercise) 116/64    Blood Pressure (Exit) 100/62    Heart Rate (Admit) 66 bpm    Heart Rate (Exercise) 80 bpm    Heart Rate (Exit) 74 bpm    Rating of Perceived Exertion (Exercise) 10    Duration Continue with 30 min of aerobic exercise without signs/symptoms of physical distress.    Intensity THRR unchanged      Progression   Progression Continue to progress workloads to maintain intensity without signs/symptoms of physical distress.      Resistance Training   Training Prescription  Yes    Weight 2    Reps 10-15    Time 10 Minutes      NuStep   Level 1    SPM 47    Minutes 39    METs 1.47             Functional Capacity:  6 Minute Walk     Row Name 01/20/22 1345         6 Minute Walk   Phase Initial     Distance 550 feet     Walk Time 6 minutes     # of Rest Breaks 1     MPH 1     METS 1.12     RPE 11     VO2 Peak 3.93     Symptoms Yes (comment)     Comments one standing 20 sec rest break     Resting HR 57 bpm     Resting BP 122/70     Resting Oxygen Saturation  95 %     Exercise Oxygen Saturation  during 6 min walk 95 %     Max Ex. HR 67 bpm     Max Ex. BP 135/75  2 Minute Post BP 120/68              Psychological, QOL, Others - Outcomes: PHQ 2/9: Depression screen [facility] 2/9 01/20/2022 08/29/2019 08/09/2019 07/29/2019 02/15/2019  Decreased Interest 1 0 0 0 0  Down, Depressed, Hopeless 1 0 0 0 0  PHQ - 2 Score 2 0 0 0 0  Altered sleeping 1 - - - -  Tired, decreased energy 1 - - - -  Change in appetite 0 - - - -  Feeling bad or failure about yourself  1 - - - -  Trouble concentrating 1 - - - -  Moving slowly or fidgety/restless 1 - - - -  Suicidal thoughts 0 - - - -  PHQ-9 Score 7 - - - -  Difficult doing work/chores Not difficult at all - - - -    Quality of Life:  Quality of Life - 01/20/22 1349       Quality of Life   Select Quality of Life      Quality of Life Scores   Health/Function Pre 16.14 %    Socioeconomic Pre 16.75 %    Psych/Spiritual Pre 24.43 %    Family Pre 16.6 %    GLOBAL Pre 18.06 %             Personal Goals: Goals established at orientation with interventions provided to work toward goal.  Personal Goals and Risk Factors at Admission - 01/20/22 1308       Core Components/Risk Factors/Patient Goals on Admission   Improve shortness of breath with ADL's Yes    Intervention Provide education, individualized exercise plan and daily activity instruction to help decrease symptoms of SOB with  activities of daily living.    Expected Outcomes Short Term: Improve cardiorespiratory fitness to achieve a reduction of symptoms when performing ADLs;Long Term: Be able to perform more ADLs without symptoms or delay the onset of symptoms    Diabetes Yes    Intervention Provide education about signs/symptoms and action to take for hypo/hyperglycemia.;Provide education about proper nutrition, including hydration, and aerobic/resistive exercise prescription along with prescribed medications to achieve blood glucose in normal ranges: Fasting glucose 65-99 mg/dL    Expected Outcomes Short Term: Participant verbalizes understanding of the signs/symptoms and immediate care of hyper/hypoglycemia, proper foot care and importance of medication, aerobic/resistive exercise and nutrition plan for blood glucose control.;Long Term: Attainment of HbA1C < 7%.    Personal Goal Other Yes    Personal Goal Improve strength and stamina    Intervention Attend cardiac rehab and begin a home exercise program.    Expected Outcomes Pt will improve strength and stamina.              Personal Goals Discharge:  Goals and Risk Factor Review     Row Name 02/03/22 [phone]             Core Components/Risk Factors/Patient Goals Review   Personal Goals Review Weight Management/Obesity;Diabetes;Improve shortness of breath with ADL's;Other       Review Patient was referred to CR with CABGx4. He has multiple risk factors for CAD and is participating in the program for risk modification. He has completed 4 sessions. His currentl weight is 153.8 lbs and his initial weight was 152.0 lbs. His blood pressures are at goals. His personal goals for the program are to improve his strength and stamina and be able to do his yardwork again. We will continue to monitor his progress as he  works towards meeting these goals.       Expected Outcomes Patient will complete the program meeting both personal and program goals.                 Exercise Goals and Review:  Exercise Goals     Row Name 01/20/22 1348 02/10/22 1302           Exercise Goals   Increase Physical Activity Yes Yes      Intervention Provide advice, education, support and counseling about physical activity/exercise needs.;Develop an individualized exercise prescription for aerobic and resistive training based on initial evaluation findings, risk stratification, comorbidities and participant's personal goals. Provide advice, education, support and counseling about physical activity/exercise needs.;Develop an individualized exercise prescription for aerobic and resistive training based on initial evaluation findings, risk stratification, comorbidities and participant's personal goals.      Expected Outcomes Short Term: Attend rehab on a regular basis to increase amount of physical activity.;Long Term: Add in home exercise to make exercise part of routine and to increase amount of physical activity.;Long Term: Exercising regularly at least 3-5 days a week. Short Term: Attend rehab on a regular basis to increase amount of physical activity.;Long Term: Add in home exercise to make exercise part of routine and to increase amount of physical activity.;Long Term: Exercising regularly at least 3-5 days a week.      Increase Strength and Stamina Yes Yes      Intervention Provide advice, education, support and counseling about physical activity/exercise needs.;Develop an individualized exercise prescription for aerobic and resistive training based on initial evaluation findings, risk stratification, comorbidities and participant's personal goals. Provide advice, education, support and counseling about physical activity/exercise needs.;Develop an individualized exercise prescription for aerobic and resistive training based on initial evaluation findings, risk stratification, comorbidities and participant's personal goals.      Expected Outcomes Short Term: Increase workloads  from initial exercise prescription for resistance, speed, and METs.;Short Term: Perform resistance training exercises routinely during rehab and add in resistance training at home;Long Term: Improve cardiorespiratory fitness, muscular endurance and strength as measured by increased METs and functional capacity (6MWT) Short Term: Increase workloads from initial exercise prescription for resistance, speed, and METs.;Short Term: Perform resistance training exercises routinely during rehab and add in resistance training at home;Long Term: Improve cardiorespiratory fitness, muscular endurance and strength as measured by increased METs and functional capacity (6MWT)      Able to understand and use rate of perceived exertion (RPE) scale Yes Yes      Intervention Provide education and explanation on how to use RPE scale Provide education and explanation on how to use RPE scale      Expected Outcomes Short Term: Able to use RPE daily in rehab to express subjective intensity level;Long Term:  Able to use RPE to guide intensity level when exercising independently Short Term: Able to use RPE daily in rehab to express subjective intensity level;Long Term:  Able to use RPE to guide intensity level when exercising independently      Knowledge and understanding of Target Heart Rate Range (THRR) Yes Yes      Intervention Provide education and explanation of THRR including how the numbers were predicted and where they are located for reference Provide education and explanation of THRR including how the numbers were predicted and where they are located for reference      Expected Outcomes Short Term: Able to state/look up THRR;Long Term: Able to use THRR to govern intensity when exercising independently;Short  Term: Able to use daily as guideline for intensity in rehab Short Term: Able to state/look up THRR;Long Term: Able to use THRR to govern intensity when exercising independently;Short Term: Able to use daily as guideline for  intensity in rehab      Able to check pulse independently Yes Yes      Intervention Provide education and demonstration on how to check pulse in carotid and radial arteries.;Review the importance of being able to check your own pulse for safety during independent exercise Provide education and demonstration on how to check pulse in carotid and radial arteries.;Review the importance of being able to check your own pulse for safety during independent exercise      Expected Outcomes Short Term: Able to explain why pulse checking is important during independent exercise;Long Term: Able to check pulse independently and accurately Short Term: Able to explain why pulse checking is important during independent exercise;Long Term: Able to check pulse independently and accurately      Understanding of Exercise Prescription Yes Yes      Intervention Provide education, explanation, and written materials on patient's individual exercise prescription Provide education, explanation, and written materials on patient's individual exercise prescription      Expected Outcomes Short Term: Able to explain program exercise prescription;Long Term: Able to explain home exercise prescription to exercise independently Short Term: Able to explain program exercise prescription;Long Term: Able to explain home exercise prescription to exercise independently               Exercise Goals Re-Evaluation:  Exercise Goals Re-Evaluation     [place] Name 02/10/22 1303             Exercise Goal Re-Evaluation   Exercise Goals Review Increase Physical Activity;Increase Strength and Stamina;Able to understand and use rate of perceived exertion (RPE) scale;Knowledge and understanding of Target Heart Rate Range (THRR);Able to check pulse independently;Understanding of Exercise Prescription       Comments Pt has completed 6 sessions of cardiac rehab. He is severely deconditioned and progress will be slow. He is only coming twice per week.  He has been hard to motivate. He SPM on the stepper are in the 40s and he rates his exertion as very light. We will motivate him to get his steps into the 60s to make it harder. He will do this for a few seconds and then go back to the slower pace. He was sent home from rehab today due to having respiratory symptoms and generalized weakness. He is currently exercising at 1.47 METs. Will continue to monitor and progress as able.       Expected Outcomes Through exercise at rehab and at home, the patient will meet their stated goals.                Nutrition & Weight - Outcomes:  Pre Biometrics - 01/20/22 1348       Pre Biometrics   Height '5\' 7"'$  (1.702 m)    Weight 152 lb 1.9 oz (69 kg)    Waist Circumference 35 inches    Hip Circumference 36 inches    Waist to Hip Ratio 0.97 %    BMI (Calculated) 23.82    Triceps Skinfold 9 mm    % Body Fat 22.1 %    Grip Strength 23.7 kg    Flexibility 0 in    Single Leg Stand 0 seconds              Nutrition:  Nutrition Therapy &  Goals - 02/03/22 0732       Personal Nutrition Goals   Comments Patient scored 26 on his diet assessment. We offer 2 educational sessions on  heart healthy nutrition with handouts and assistance with RD referral if patient is interested.      Intervention Plan   Intervention Nutrition handout(s) given to patient.    Expected Outcomes Short Term Goal: Understand basic principles of dietary content, such as calories, fat, sodium, cholesterol and nutrients.             Nutrition Discharge:  Nutrition Assessments - 01/20/22 1301       MEDFICTS Scores   Pre Score 26             Education Questionnaire Score:  Knowledge Questionnaire Score - 01/20/22 1303       Knowledge Questionnaire Score   Pre Score 20/24             Pt discharged from cardiac rehab after 7 sessions. Pt's wife reports that this was too much for him physically, and he will be unable to continue.

## 2022-02-21 ENCOUNTER — Encounter (HOSPITAL_COMMUNITY): Payer: Medicare HMO

## 2022-02-24 ENCOUNTER — Encounter (HOSPITAL_COMMUNITY): Payer: Medicare HMO

## 2022-02-27 DIAGNOSIS — I214 Non-ST elevation (NSTEMI) myocardial infarction: Secondary | ICD-10-CM | POA: Diagnosis not present

## 2022-02-27 DIAGNOSIS — Z794 Long term (current) use of insulin: Secondary | ICD-10-CM | POA: Diagnosis not present

## 2022-02-27 DIAGNOSIS — E1122 Type 2 diabetes mellitus with diabetic chronic kidney disease: Secondary | ICD-10-CM | POA: Diagnosis not present

## 2022-02-27 DIAGNOSIS — I10 Essential (primary) hypertension: Secondary | ICD-10-CM | POA: Diagnosis not present

## 2022-02-27 DIAGNOSIS — Z951 Presence of aortocoronary bypass graft: Secondary | ICD-10-CM | POA: Diagnosis not present

## 2022-02-27 DIAGNOSIS — Z125 Encounter for screening for malignant neoplasm of prostate: Secondary | ICD-10-CM | POA: Diagnosis not present

## 2022-02-28 ENCOUNTER — Encounter (HOSPITAL_COMMUNITY): Payer: Medicare HMO

## 2022-02-28 ENCOUNTER — Telehealth: Payer: Self-pay | Admitting: Cardiology

## 2022-02-28 MED ORDER — METOPROLOL TARTRATE 50 MG PO TABS: 50.0000 mg | ORAL_TABLET | Freq: Two times a day (BID) | ORAL | 1 refills | Status: DC

## 2022-02-28 NOTE — Telephone Encounter (Signed)
?*  STAT* If patient is at the pharmacy, call can be transferred to refill team. ? ? ?1. Which medications need to be refilled? (please list name of each medication and dose if known) metoprolol tartrate (LOPRESSOR) 50 MG tablet ? ?2. Which pharmacy/location (including street and city if local pharmacy) is medication to be sent to? [id number] [address] ? ?3. Do they need a 30 day or 90 day supply? 90 day  ? ?Patient is out of medication ?

## 2022-02-28 NOTE — Telephone Encounter (Signed)
Refill sent to pharmacy.   

## 2022-03-03 ENCOUNTER — Encounter (HOSPITAL_COMMUNITY): Payer: Medicare HMO

## 2022-03-04 DIAGNOSIS — E119 Type 2 diabetes mellitus without complications: Secondary | ICD-10-CM | POA: Diagnosis not present

## 2022-03-07 ENCOUNTER — Encounter (HOSPITAL_COMMUNITY): Payer: Medicare HMO

## 2022-03-10 ENCOUNTER — Encounter (HOSPITAL_COMMUNITY): Payer: Medicare HMO

## 2022-03-12 ENCOUNTER — Encounter (HOSPITAL_COMMUNITY): Payer: Self-pay | Admitting: Radiology

## 2022-03-14 ENCOUNTER — Encounter (HOSPITAL_COMMUNITY): Payer: Medicare HMO

## 2022-03-16 ENCOUNTER — Other Ambulatory Visit: Payer: Self-pay

## 2022-03-16 ENCOUNTER — Emergency Department (HOSPITAL_COMMUNITY): Payer: Medicare HMO

## 2022-03-16 ENCOUNTER — Encounter (HOSPITAL_COMMUNITY): Payer: Self-pay | Admitting: Emergency Medicine

## 2022-03-16 ENCOUNTER — Inpatient Hospital Stay (HOSPITAL_COMMUNITY)
Admission: EM | Admit: 2022-03-16 | Discharge: 2022-03-24 | DRG: 871 | Disposition: A | Discharge status: Skilled Nursing Facility | Payer: Medicare HMO | Attending: Internal Medicine | Admitting: Internal Medicine

## 2022-03-16 DIAGNOSIS — A419 Sepsis, unspecified organism: Secondary | ICD-10-CM | POA: Diagnosis not present

## 2022-03-16 DIAGNOSIS — R6520 Severe sepsis without septic shock: Secondary | ICD-10-CM | POA: Diagnosis not present

## 2022-03-16 DIAGNOSIS — Z7982 Long term (current) use of aspirin: Secondary | ICD-10-CM

## 2022-03-16 DIAGNOSIS — R0609 Other forms of dyspnea: Secondary | ICD-10-CM | POA: Diagnosis not present

## 2022-03-16 DIAGNOSIS — I959 Hypotension, unspecified: Secondary | ICD-10-CM | POA: Diagnosis present

## 2022-03-16 DIAGNOSIS — I252 Old myocardial infarction: Secondary | ICD-10-CM

## 2022-03-16 DIAGNOSIS — E785 Hyperlipidemia, unspecified: Secondary | ICD-10-CM | POA: Diagnosis not present

## 2022-03-16 DIAGNOSIS — E1165 Type 2 diabetes mellitus with hyperglycemia: Secondary | ICD-10-CM | POA: Diagnosis not present

## 2022-03-16 DIAGNOSIS — E1122 Type 2 diabetes mellitus with diabetic chronic kidney disease: Secondary | ICD-10-CM | POA: Diagnosis present

## 2022-03-16 DIAGNOSIS — Z8551 Personal history of malignant neoplasm of bladder: Secondary | ICD-10-CM

## 2022-03-16 DIAGNOSIS — N1831 Chronic kidney disease, stage 3a: Secondary | ICD-10-CM | POA: Diagnosis present

## 2022-03-16 DIAGNOSIS — R0602 Shortness of breath: Principal | ICD-10-CM

## 2022-03-16 DIAGNOSIS — I2510 Atherosclerotic heart disease of native coronary artery without angina pectoris: Secondary | ICD-10-CM | POA: Diagnosis present

## 2022-03-16 DIAGNOSIS — Z8249 Family history of ischemic heart disease and other diseases of the circulatory system: Secondary | ICD-10-CM

## 2022-03-16 DIAGNOSIS — Z951 Presence of aortocoronary bypass graft: Secondary | ICD-10-CM

## 2022-03-16 DIAGNOSIS — Z7902 Long term (current) use of antithrombotics/antiplatelets: Secondary | ICD-10-CM

## 2022-03-16 DIAGNOSIS — E46 Unspecified protein-calorie malnutrition: Secondary | ICD-10-CM | POA: Diagnosis not present

## 2022-03-16 DIAGNOSIS — Z6824 Body mass index (BMI) 24.0-24.9, adult: Secondary | ICD-10-CM

## 2022-03-16 DIAGNOSIS — Z8701 Personal history of pneumonia (recurrent): Secondary | ICD-10-CM

## 2022-03-16 DIAGNOSIS — R1110 Vomiting, unspecified: Secondary | ICD-10-CM | POA: Diagnosis not present

## 2022-03-16 DIAGNOSIS — R778 Other specified abnormalities of plasma proteins: Secondary | ICD-10-CM

## 2022-03-16 DIAGNOSIS — J690 Pneumonitis due to inhalation of food and vomit: Secondary | ICD-10-CM | POA: Diagnosis not present

## 2022-03-16 DIAGNOSIS — R062 Wheezing: Secondary | ICD-10-CM | POA: Diagnosis not present

## 2022-03-16 DIAGNOSIS — J189 Pneumonia, unspecified organism: Secondary | ICD-10-CM | POA: Diagnosis not present

## 2022-03-16 DIAGNOSIS — E860 Dehydration: Secondary | ICD-10-CM | POA: Diagnosis present

## 2022-03-16 DIAGNOSIS — E1169 Type 2 diabetes mellitus with other specified complication: Secondary | ICD-10-CM | POA: Diagnosis not present

## 2022-03-16 DIAGNOSIS — Z20822 Contact with and (suspected) exposure to covid-19: Secondary | ICD-10-CM | POA: Diagnosis not present

## 2022-03-16 DIAGNOSIS — E8809 Other disorders of plasma-protein metabolism, not elsewhere classified: Secondary | ICD-10-CM | POA: Diagnosis not present

## 2022-03-16 DIAGNOSIS — D72828 Other elevated white blood cell count: Secondary | ICD-10-CM | POA: Diagnosis not present

## 2022-03-16 DIAGNOSIS — Z794 Long term (current) use of insulin: Secondary | ICD-10-CM

## 2022-03-16 DIAGNOSIS — I152 Hypertension secondary to endocrine disorders: Secondary | ICD-10-CM | POA: Diagnosis present

## 2022-03-16 DIAGNOSIS — D72829 Elevated white blood cell count, unspecified: Secondary | ICD-10-CM

## 2022-03-16 DIAGNOSIS — D696 Thrombocytopenia, unspecified: Secondary | ICD-10-CM | POA: Diagnosis not present

## 2022-03-16 DIAGNOSIS — N179 Acute kidney failure, unspecified: Secondary | ICD-10-CM | POA: Diagnosis not present

## 2022-03-16 DIAGNOSIS — Z79899 Other long term (current) drug therapy: Secondary | ICD-10-CM

## 2022-03-16 DIAGNOSIS — E1159 Type 2 diabetes mellitus with other circulatory complications: Secondary | ICD-10-CM | POA: Diagnosis not present

## 2022-03-16 DIAGNOSIS — R269 Unspecified abnormalities of gait and mobility: Secondary | ICD-10-CM | POA: Diagnosis present

## 2022-03-16 DIAGNOSIS — F919 Conduct disorder, unspecified: Secondary | ICD-10-CM | POA: Diagnosis present

## 2022-03-16 DIAGNOSIS — Z888 Allergy status to other drugs, medicaments and biological substances status: Secondary | ICD-10-CM

## 2022-03-16 DIAGNOSIS — D72825 Bandemia: Secondary | ICD-10-CM | POA: Diagnosis not present

## 2022-03-16 DIAGNOSIS — R058 Other specified cough: Secondary | ICD-10-CM | POA: Diagnosis not present

## 2022-03-16 DIAGNOSIS — Z87891 Personal history of nicotine dependence: Secondary | ICD-10-CM

## 2022-03-16 DIAGNOSIS — E872 Acidosis, unspecified: Secondary | ICD-10-CM | POA: Diagnosis not present

## 2022-03-16 DIAGNOSIS — R7989 Other specified abnormal findings of blood chemistry: Secondary | ICD-10-CM

## 2022-03-16 DIAGNOSIS — J9811 Atelectasis: Secondary | ICD-10-CM | POA: Diagnosis not present

## 2022-03-16 LAB — COMPREHENSIVE METABOLIC PANEL
ALT: 41 U/L (ref 0–44)
AST: 116 U/L — ABNORMAL HIGH (ref 15–41)
Albumin: 3.3 g/dL — ABNORMAL LOW (ref 3.5–5.0)
Alkaline Phosphatase: 78 U/L (ref 38–126)
Anion gap: 16 — ABNORMAL HIGH (ref 5–15)
BUN: 43 mg/dL — ABNORMAL HIGH (ref 8–23)
CO2: 18 mmol/L — ABNORMAL LOW (ref 22–32)
Calcium: 8.6 mg/dL — ABNORMAL LOW (ref 8.9–10.3)
Chloride: 107 mmol/L (ref 98–111)
Creatinine, Ser: 2.42 mg/dL — ABNORMAL HIGH (ref 0.61–1.24)
GFR, Estimated: 27 mL/min — ABNORMAL LOW (ref >60)
Glucose, Bld: 112 mg/dL — ABNORMAL HIGH (ref 70–99)
Potassium: 4.2 mmol/L (ref 3.5–5.1)
Sodium: 141 mmol/L (ref 135–145)
Total Bilirubin: 1 mg/dL (ref 0.3–1.2)
Total Protein: 7.1 g/dL (ref 6.5–8.1)

## 2022-03-16 LAB — BRAIN NATRIURETIC PEPTIDE: B Natriuretic Peptide: 620 pg/mL — ABNORMAL HIGH (ref 0.0–100.0)

## 2022-03-16 LAB — CBC
HCT: 42.2 % (ref 39.0–52.0)
Hemoglobin: 12.8 g/dL — ABNORMAL LOW (ref 13.0–17.0)
MCH: 24.7 pg — ABNORMAL LOW (ref 26.0–34.0)
MCHC: 30.3 g/dL (ref 30.0–36.0)
MCV: 81.3 fL (ref 80.0–100.0)
Platelets: 121 10*3/uL — ABNORMAL LOW (ref 150–400)
RBC: 5.19 MIL/uL (ref 4.22–5.81)
RDW: 17.4 % — ABNORMAL HIGH (ref 11.5–15.5)
WBC: 29.6 10*3/uL — ABNORMAL HIGH (ref 4.0–10.5)
nRBC: 0 % (ref 0.0–0.2)

## 2022-03-16 LAB — LACTIC ACID, PLASMA: Lactic Acid, Venous: 4.7 mmol/L (ref 0.5–1.9)

## 2022-03-16 LAB — TROPONIN I (HIGH SENSITIVITY): Troponin I (High Sensitivity): 403 ng/L (ref <18)

## 2022-03-16 IMAGING — DX DG CHEST 1V PORT
2 series · 2 of 2 positions shown · non-contrast
Comparison: [DATE]

CLINICAL DATA: Shortness of breath

EXAM:
PORTABLE CHEST 1 VIEW

[chest ap (1 of 2)]
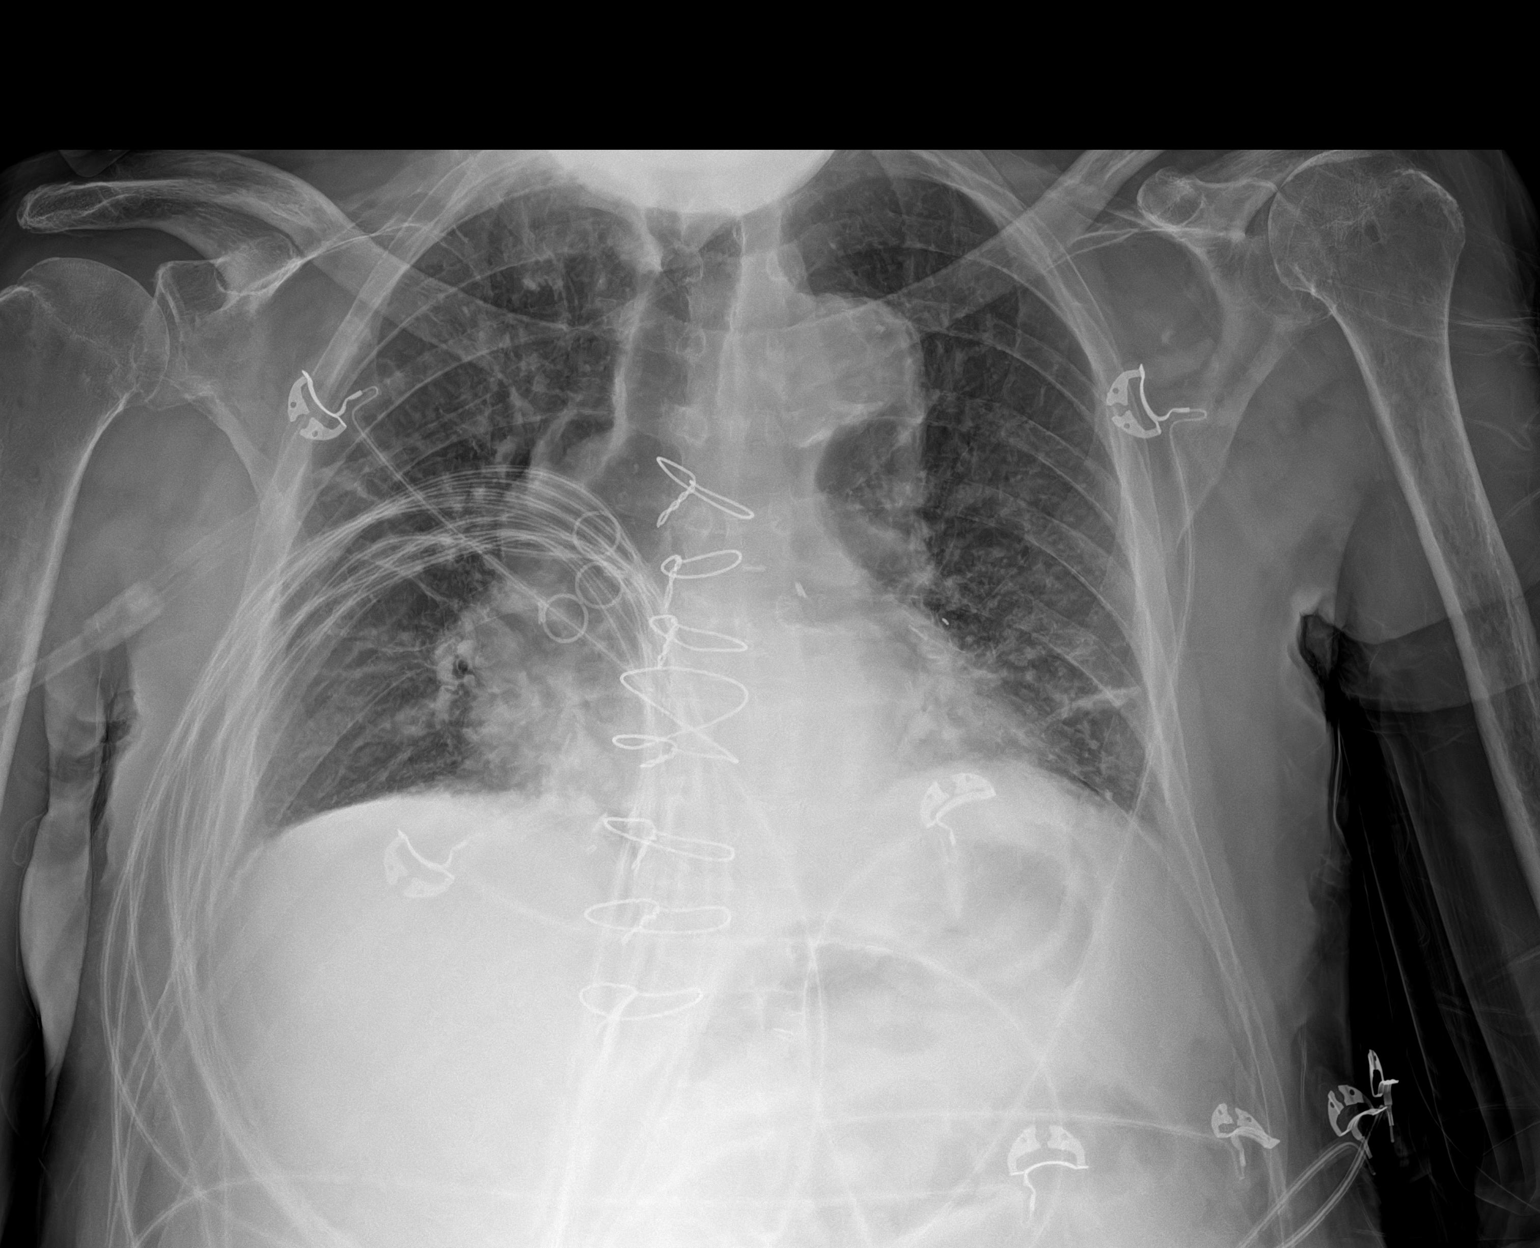

[chest ap (2 of 2)]
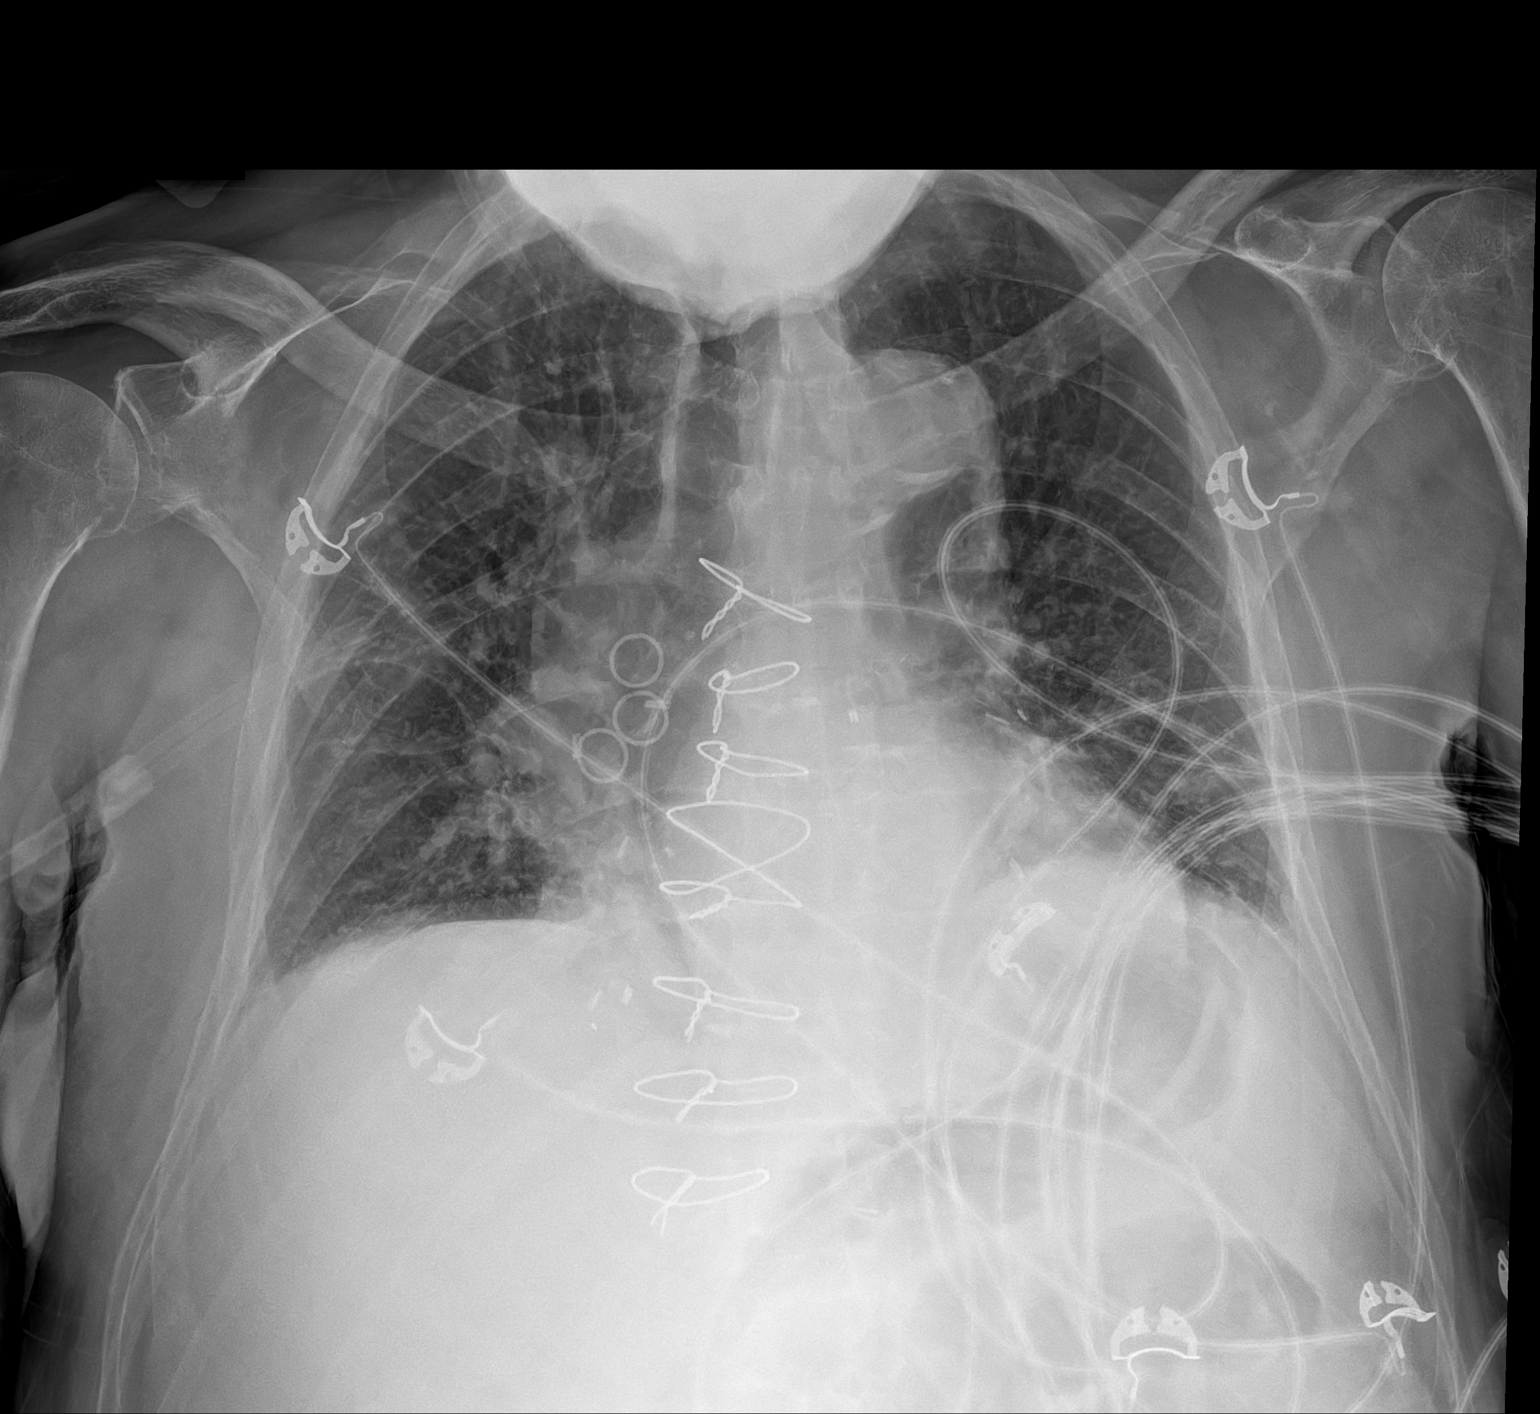

[2 of 2 positions shown; findings below may reference images not displayed]

FINDINGS: Prior CABG. Heart is normal size. No confluent opacities or
effusions. Linear scarring at the left lung base. No acute bony
abnormality.
IMPRESSION: No active disease.

## 2022-03-16 MED ORDER — LACTATED RINGERS IV BOLUS
500.0000 mL | Freq: Once | INTRAVENOUS | Status: AC
  Administered 2022-03-17: 500 mL via INTRAVENOUS

## 2022-03-16 MED ORDER — LACTATED RINGERS IV SOLN: INTRAVENOUS | Status: AC

## 2022-03-16 MED ORDER — LACTATED RINGERS IV BOLUS (SEPSIS)
1000.0000 mL | Freq: Once | INTRAVENOUS | Status: AC
  Administered 2022-03-17: 1000 mL via INTRAVENOUS

## 2022-03-16 MED ORDER — ALBUTEROL SULFATE HFA 108 (90 BASE) MCG/ACT IN AERS: 2.0000 | INHALATION_SPRAY | RESPIRATORY_TRACT | Status: DC | PRN

## 2022-03-16 MED ORDER — AZITHROMYCIN 500 MG IV SOLR
500.0000 mg | Freq: Once | INTRAVENOUS | Status: AC
Start: 2022-03-16 — End: 2022-03-17
  Administered 2022-03-17: 500 mg via INTRAVENOUS
  Filled 2022-03-16: qty 5

## 2022-03-16 MED ORDER — SODIUM CHLORIDE 0.9 % IV SOLN
1.0000 g | Freq: Once | INTRAVENOUS | Status: AC
  Administered 2022-03-17: 1 g via INTRAVENOUS
  Filled 2022-03-16: qty 10

## 2022-03-16 MED ORDER — LACTATED RINGERS IV BOLUS (SEPSIS)
250.0000 mL | Freq: Once | INTRAVENOUS | Status: AC
  Administered 2022-03-17: 250 mL via INTRAVENOUS

## 2022-03-16 NOTE — ED Triage Notes (Signed)
Pt from home with c/o SOB. EMS states pt had tachypnea upon their arrival and that pt had expiratory wheezing on R. Pt was given 3 Duonebs by EMS and then stated he was ready to go back home as he felt "better". Pt with recent hx of PNA.  ?

## 2022-03-16 NOTE — ED Provider Notes (Addendum)
[facility] EMERGENCY DEPARTMENT Provider Note   CSN: [id number] Arrival date & time: 03/16/22  2120     History  Chief Complaint  Patient presents with   Shortness of Breath    Robert Osborne is a 76 y.o. male.  Pt with hx htn,  dm, sob. Symptoms acute onset in past day, moderate, persistent. EMS noted tachypnea, ?wheezing, gave duoneb tx, and pt notes feels improved. Occasional non prod cough - increased from baseline. . No sore throat. No chest pain or discomfort. Denies leg pain or swelling or orthopnea or pnd. States compliant w normal home meds. No known ill contacts. No fever or chills. Pt relatively poor historian - level 5 caveat. ?recent pna or uti - saw pcp ~ 2 weeks ago with urinary symptoms then, unclear if received tx, pt not sure.   The history is provided by the patient, the EMS personnel, a relative and medical records.  Shortness of Breath Associated symptoms: cough   Associated symptoms: no abdominal pain, no chest pain, no fever, no headaches, no neck pain, no rash, no sore throat and no vomiting       Home Medications Prior to Admission medications   Medication Sig Start Date End Date Taking? Authorizing Provider  aspirin EC 81 MG EC tablet Take 1 tablet (81 mg total) by mouth daily. Swallow whole. 08/30/21   Barrett, Erin R, PA-C  blood glucose meter kit and supplies KIT Dispense based on patient and insurance preference. Use up to twice a daily as directed. (FOR ICD-10 - type 2 DM uncontrolled E11.9) 08/19/19   Johnson, Clanford L, MD  blood glucose meter kit and supplies Dispense based on patient and insurance preference. Use up to four times daily as directed. (FOR ICD-10 E10.9, E11.9). 08/23/19   Jannifer Rodney A, FNP  blood glucose meter kit and supplies Use up to four times daily as directed. dx E11.9 08/23/19   Remus Loffler, PA-C  Blood Glucose Monitoring Suppl (ONE TOUCH ULTRA 2) w/Device KIT USE TO CHECK BLOOD SUGAR UP TO 4 TIMES DAILY AS DIRECTED 05/15/20    Jannifer Rodney A, FNP  clopidogrel (PLAVIX) 75 MG tablet Take 1 tablet (75 mg total) by mouth daily. 08/30/21   Barrett, Erin R, PA-C  dapagliflozin propanediol (FARXIGA) 10 MG TABS tablet Take 1 tablet (10 mg total) by mouth daily. 08/30/21   Barrett, Erin R, PA-C  ezetimibe (ZETIA) 10 MG tablet Take 1 tablet (10 mg total) by mouth daily. 10/17/21 01/20/22  Azalee Course, PA  glucose blood test strip Use as instructed 05/15/20   Jannifer Rodney A, FNP  insulin aspart (NOVOLOG) 100 UNIT/ML FlexPen Inject 9 Units into the skin 2 (two) times daily with a meal. Patient not taking: Reported on 01/20/2022 01/10/20   Jannifer Rodney A, FNP  insulin degludec (TRESIBA FLEXTOUCH) 100 UNIT/ML SOPN FlexTouch Pen Inject 0.05 mLs (5 Units total) into the skin daily. Patient taking differently: Inject 18 Units into the skin daily. 08/10/19   Jannifer Rodney A, FNP  Insulin Pen Needle (PEN NEEDLES) 32G X 4 MM MISC 100 each by Does not apply route 4 (four) times daily. E10.9 05/16/20   Jannifer Rodney A, FNP  isosorbide mononitrate (IMDUR) 60 MG 24 hr tablet Take 1 tablet (60 mg total) by mouth daily. 09/10/21   Azalee Course, PA  Lancets (ONETOUCH DELICA PLUS LANCET33G) MISC USE TO CHECK BLOOD SUGAR UP TO 4 TIMES A DAY AS DIRECTED 01/23/20   Junie Spencer, FNP  levofloxacin (LEVAQUIN) 500 MG tablet Take 1 tablet (500 mg total) by mouth daily. Patient not taking: Reported on 01/20/2022 10/21/21   Blue, Soijett A, PA-C  metoprolol tartrate (LOPRESSOR) 50 MG tablet Take 1 tablet (50 mg total) by mouth 2 (two) times daily. 02/28/22   Rollene Rotunda, MD  Multiple Vitamin (MULTIVITAMIN WITH MINERALS) TABS tablet Take 1 tablet by mouth daily.    [provider]  nitroGLYCERIN (NITROSTAT) 0.4 MG SL tablet Place 0.4 mg under the tongue every 5 (five) minutes as needed for chest pain.    [provider]  OLANZapine (ZYPREXA) 5 MG tablet Take 5 mg by mouth at bedtime.    [provider]  oxyCODONE (OXY IR/ROXICODONE) 5 MG  immediate release tablet Take 1-2 tablets (5-10 mg total) by mouth every 4 (four) hours as needed for severe pain. Patient not taking: Reported on 01/20/2022 09/06/21   Corliss Skains, MD  rosuvastatin (CRESTOR) 40 MG tablet Take 1 tablet (40 mg total) by mouth daily. 08/30/21   Barrett, Erin R, PA-C  tamsulosin (FLOMAX) 0.4 MG CAPS capsule Take 0.4 mg by mouth daily.    [provider]      Allergies    Enalapril    Review of Systems   Review of Systems  Constitutional:  Negative for chills and fever.  HENT:  Negative for sore throat.   Eyes:  Negative for redness.  Respiratory:  Positive for cough and shortness of breath.   Cardiovascular:  Negative for chest pain, palpitations and leg swelling.  Gastrointestinal:  Negative for abdominal pain and vomiting.  Genitourinary:  Negative for flank pain.  Musculoskeletal:  Negative for back pain and neck pain.  Skin:  Negative for rash.  Neurological:  Negative for headaches.  Hematological:  Does not bruise/bleed easily.  Psychiatric/Behavioral:  Negative for confusion.    Physical Exam Updated Vital Signs BP (!) 191/161 (BP Location: Left Arm)   Pulse 97   Temp 99 F (37.2 C) (Oral)   Resp (!) 27   Ht 1.702 m (5\' 7" )   Wt 70.3 kg   SpO2 98%   BMI 24.28 kg/m  Physical Exam Vitals and nursing note reviewed.  Constitutional:      Appearance: Normal appearance. He is well-developed.  HENT:     Head: Atraumatic.     Nose: Nose normal.     Mouth/Throat:     Mouth: Mucous membranes are moist.  Eyes:     General: No scleral icterus.    Conjunctiva/sclera: Conjunctivae normal.  Neck:     Trachea: No tracheal deviation.     Comments: No stiffness or rigidity.  Cardiovascular:     Rate and Rhythm: Normal rate and regular rhythm.     Pulses: Normal pulses.     Heart sounds: Normal heart sounds. No murmur heard.   No friction rub. No gallop.  Pulmonary:     Effort: Pulmonary effort is normal. No accessory muscle  usage or respiratory distress.     Breath sounds: Rhonchi present.  Abdominal:     General: Bowel sounds are normal. There is no distension.     Palpations: Abdomen is soft.     Tenderness: There is no abdominal tenderness.  Genitourinary:    Comments: No cva tenderness. Musculoskeletal:        General: No swelling or tenderness.     Cervical back: Normal range of motion and neck supple. No rigidity.     Right lower leg: No edema.  Left lower leg: No edema.  Skin:    General: Skin is warm and dry.     Findings: No rash.  Neurological:     Mental Status: He is alert.     Comments: Alert, speech clear.   Psychiatric:        Mood and Affect: Mood normal.    ED Results / Procedures / Treatments   Labs (all labs ordered are listed, but only abnormal results are displayed) Results for orders placed or performed during the hospital encounter of 03/16/22  Brain natriuretic peptide  Result Value Ref Range   B Natriuretic Peptide 620.0 (H) 0.0 - 100.0 pg/mL  CBC  Result Value Ref Range   WBC 29.6 (H) 4.0 - 10.5 K/uL   RBC 5.19 4.22 - 5.81 MIL/uL   Hemoglobin 12.8 (L) 13.0 - 17.0 g/dL   HCT 81.1 91.4 - 78.2 %   MCV 81.3 80.0 - 100.0 fL   MCH 24.7 (L) 26.0 - 34.0 pg   MCHC 30.3 30.0 - 36.0 g/dL   RDW 95.6 (H) 21.3 - 08.6 %   Platelets 121 (L) 150 - 400 K/uL   nRBC 0.0 0.0 - 0.2 %  Comprehensive metabolic panel  Result Value Ref Range   Sodium 141 135 - 145 mmol/L   Potassium 4.2 3.5 - 5.1 mmol/L   Chloride 107 98 - 111 mmol/L   CO2 18 (L) 22 - 32 mmol/L   Glucose, Bld 112 (H) 70 - 99 mg/dL   BUN 43 (H) 8 - 23 mg/dL   Creatinine, Ser 5.78 (H) 0.61 - 1.24 mg/dL   Calcium 8.6 (L) 8.9 - 10.3 mg/dL   Total Protein 7.1 6.5 - 8.1 g/dL   Albumin 3.3 (L) 3.5 - 5.0 g/dL   AST 469 (H) 15 - 41 U/L   ALT 41 0 - 44 U/L   Alkaline Phosphatase 78 38 - 126 U/L   Total Bilirubin 1.0 0.3 - 1.2 mg/dL   GFR, Estimated 27 (L) >60 mL/min   Anion gap 16 (H) 5 - 15   DG Chest Portable 1  View  Result Date: 03/16/2022 CLINICAL DATA:  Shortness of breath EXAM: PORTABLE CHEST 1 VIEW COMPARISON:  10/21/2021 FINDINGS: Prior CABG. Heart is normal size. No confluent opacities or effusions. Linear scarring at the left lung base. No acute bony abnormality. IMPRESSION: No active disease. Electronically Signed   By: Charlett Nose M.D.   On: 03/16/2022 22:01     ED ECG REPORT   Date: 03/16/2022  Rate: 96  Rhythm: normal sinus rhythm  QRS Axis: normal  Intervals: normal  ST/T Wave abnormalities: normal  Conduction Disutrbances:none  Narrative Interpretation:   Old EKG Reviewed: unchanged +baseline wander  I have personally reviewed the EKG tracing     Radiology DG Chest Portable 1 View  Result Date: 03/16/2022 CLINICAL DATA:  Shortness of breath EXAM: PORTABLE CHEST 1 VIEW COMPARISON:  10/21/2021 FINDINGS: Prior CABG. Heart is normal size. No confluent opacities or effusions. Linear scarring at the left lung base. No acute bony abnormality. IMPRESSION: No active disease. Electronically Signed   By: Charlett Nose M.D.   On: 03/16/2022 22:01    Procedures Procedures    Medications Ordered in ED Medications  albuterol (VENTOLIN HFA) 108 (90 Base) MCG/ACT inhaler 2 puff (has no administration in time range)    ED Course/ Medical Decision Making/ A&P  Medical Decision Making Problems Addressed: Non-productive cough: acute illness or injury with systemic symptoms Other elevated white blood cell (WBC) count: acute illness or injury with systemic symptoms Shortness of breath: acute illness or injury with systemic symptoms that poses a threat to life or bodily functions  Amount and/or Complexity of Data Reviewed Independent Historian: EMS    Details: ems/fam. hx External Data Reviewed: notes. Labs: ordered. Decision-making details documented in ED Course. Radiology: ordered and independent interpretation performed. Decision-making details documented  in ED Course. ECG/medicine tests: ordered and independent interpretation performed. Decision-making details documented in ED Course. Discussion of management or test interpretation with external provider(s): Hospitalists, discussed pt, admit.   Risk Prescription drug management. Decision regarding hospitalization.   Iv ns. Continuous pulse ox and cardiac monitoring. Labs ordered/sent. Imaging ordered.   Reviewed nursing notes and prior charts for additional history. External reports reviewed. Additional history from: EMS/family.   Cardiac monitor: sinus rhythm, rate 94.  Labs reviewed/interpreted by me - wbc high.   Xrays reviewed/interpreted by me - no def pna.   Initial bp reading was high ?accurate as cuff not applied correctly and line under pt. Cuff re-adjusted. On repeat, bp is now soft, 90s. Pt denies chest pain. Does note increased cough in past 1-2 days. Sounds congested.  Additional labs added. Cultures. Lactate.   Additional labs reviewed/interpreted by me - aki on labs.   Iv abx given.   Will consult hospitalists for admission.   Signed out to Dr Blinda Leatherwood to f/u on pending labs, trop, lactate, ua, etc.            Final Clinical Impression(s) / ED Diagnoses Final diagnoses:  None    Rx / DC Orders ED Discharge Orders     None            Cathren Laine, MD 03/16/22 2338

## 2022-03-17 ENCOUNTER — Other Ambulatory Visit (HOSPITAL_COMMUNITY): Payer: Medicare HMO

## 2022-03-17 ENCOUNTER — Encounter (HOSPITAL_COMMUNITY): Payer: Medicare HMO

## 2022-03-17 ENCOUNTER — Other Ambulatory Visit (HOSPITAL_COMMUNITY): Payer: Self-pay | Admitting: *Deleted

## 2022-03-17 ENCOUNTER — Inpatient Hospital Stay (HOSPITAL_COMMUNITY): Payer: Medicare HMO

## 2022-03-17 DIAGNOSIS — I152 Hypertension secondary to endocrine disorders: Secondary | ICD-10-CM

## 2022-03-17 DIAGNOSIS — E785 Hyperlipidemia, unspecified: Secondary | ICD-10-CM

## 2022-03-17 DIAGNOSIS — J189 Pneumonia, unspecified organism: Secondary | ICD-10-CM | POA: Diagnosis not present

## 2022-03-17 DIAGNOSIS — R778 Other specified abnormalities of plasma proteins: Secondary | ICD-10-CM | POA: Diagnosis not present

## 2022-03-17 DIAGNOSIS — N179 Acute kidney failure, unspecified: Secondary | ICD-10-CM

## 2022-03-17 DIAGNOSIS — E1165 Type 2 diabetes mellitus with hyperglycemia: Secondary | ICD-10-CM

## 2022-03-17 DIAGNOSIS — A419 Sepsis, unspecified organism: Principal | ICD-10-CM

## 2022-03-17 DIAGNOSIS — E1169 Type 2 diabetes mellitus with other specified complication: Secondary | ICD-10-CM

## 2022-03-17 DIAGNOSIS — R6520 Severe sepsis without septic shock: Secondary | ICD-10-CM

## 2022-03-17 DIAGNOSIS — E46 Unspecified protein-calorie malnutrition: Secondary | ICD-10-CM

## 2022-03-17 DIAGNOSIS — E1159 Type 2 diabetes mellitus with other circulatory complications: Secondary | ICD-10-CM

## 2022-03-17 DIAGNOSIS — E8809 Other disorders of plasma-protein metabolism, not elsewhere classified: Secondary | ICD-10-CM

## 2022-03-17 DIAGNOSIS — R7989 Other specified abnormal findings of blood chemistry: Secondary | ICD-10-CM

## 2022-03-17 LAB — URINALYSIS, ROUTINE W REFLEX MICROSCOPIC
Bilirubin Urine: NEGATIVE
Glucose, UA: >=500 mg/dL — AB
Ketones, ur: NEGATIVE mg/dL
Nitrite: NEGATIVE
Protein, ur: 30 mg/dL — AB
RBC / HPF: >50 RBC/hpf — ABNORMAL HIGH (ref 0–5)
Specific Gravity, Urine: 1.017 (ref 1.005–1.030)
WBC, UA: >50 WBC/hpf — ABNORMAL HIGH (ref 0–5)
pH: 5 (ref 5.0–8.0)

## 2022-03-17 LAB — PROTIME-INR
INR: 1.7 — ABNORMAL HIGH (ref 0.8–1.2)
Prothrombin Time: 19.5 seconds — ABNORMAL HIGH (ref 11.4–15.2)

## 2022-03-17 LAB — PROCALCITONIN: Procalcitonin: >150 ng/mL

## 2022-03-17 LAB — RESP PANEL BY RT-PCR (FLU A&B, COVID) ARPGX2
Influenza A by PCR: NEGATIVE
Influenza B by PCR: NEGATIVE
SARS Coronavirus 2 by RT PCR: NEGATIVE

## 2022-03-17 LAB — GLUCOSE, CAPILLARY
Glucose-Capillary: 121 mg/dL — ABNORMAL HIGH (ref 70–99)
Glucose-Capillary: 107 mg/dL — ABNORMAL HIGH (ref 70–99)
Glucose-Capillary: 169 mg/dL — ABNORMAL HIGH (ref 70–99)
Glucose-Capillary: 81 mg/dL (ref 70–99)

## 2022-03-17 LAB — LACTIC ACID, PLASMA
Lactic Acid, Venous: 5 mmol/L (ref 0.5–1.9)
Lactic Acid, Venous: 3.7 mmol/L (ref 0.5–1.9)
Lactic Acid, Venous: 2.4 mmol/L (ref 0.5–1.9)
Lactic Acid, Venous: 1.7 mmol/L (ref 0.5–1.9)

## 2022-03-17 LAB — TROPONIN I (HIGH SENSITIVITY)
Troponin I (High Sensitivity): 484 ng/L (ref <18)
Troponin I (High Sensitivity): 481 ng/L (ref <18)
Troponin I (High Sensitivity): 572 ng/L (ref <18)

## 2022-03-17 LAB — CORTISOL-AM, BLOOD: Cortisol - AM: 45.6 ug/dL — ABNORMAL HIGH (ref 6.7–22.6)

## 2022-03-17 LAB — MAGNESIUM: Magnesium: 1.9 mg/dL (ref 1.7–2.4)

## 2022-03-17 MED ORDER — GUAIFENESIN ER 600 MG PO TB12
600.0000 mg | ORAL_TABLET | Freq: Two times a day (BID) | ORAL | Status: DC
  Administered 2022-03-17 – 2022-03-24 (×15): 600 mg via ORAL
  Filled 2022-03-17 (×15): qty 1

## 2022-03-17 MED ORDER — ALBUTEROL SULFATE (2.5 MG/3ML) 0.083% IN NEBU: 2.5000 mg | INHALATION_SOLUTION | RESPIRATORY_TRACT | Status: DC | PRN

## 2022-03-17 MED ORDER — ADULT MULTIVITAMIN W/MINERALS CH
1.0000 | ORAL_TABLET | Freq: Every day | ORAL | Status: DC
  Administered 2022-03-17 – 2022-03-24 (×8): 1 via ORAL
  Filled 2022-03-17 (×8): qty 1

## 2022-03-17 MED ORDER — ISOSORBIDE MONONITRATE ER 60 MG PO TB24
60.0000 mg | ORAL_TABLET | Freq: Every day | ORAL | Status: DC
  Administered 2022-03-17 – 2022-03-24 (×8): 60 mg via ORAL
  Filled 2022-03-17 (×7): qty 1

## 2022-03-17 MED ORDER — POLYETHYLENE GLYCOL 3350 17 G PO PACK: 17.0000 g | PACK | Freq: Every day | ORAL | Status: DC | PRN

## 2022-03-17 MED ORDER — INSULIN ASPART 100 UNIT/ML IJ SOLN
0.0000 [IU] | Freq: Three times a day (TID) | INTRAMUSCULAR | Status: DC
  Administered 2022-03-17: 2 [IU] via SUBCUTANEOUS
  Administered 2022-03-17 – 2022-03-18 (×2): 3 [IU] via SUBCUTANEOUS
  Administered 2022-03-18: 8 [IU] via SUBCUTANEOUS
  Administered 2022-03-19 – 2022-03-20 (×4): 3 [IU] via SUBCUTANEOUS
  Administered 2022-03-21 (×2): 2 [IU] via SUBCUTANEOUS
  Administered 2022-03-22: 3 [IU] via SUBCUTANEOUS
  Administered 2022-03-22 – 2022-03-23 (×2): 2 [IU] via SUBCUTANEOUS
  Administered 2022-03-23: 5 [IU] via SUBCUTANEOUS
  Administered 2022-03-24: 2 [IU] via SUBCUTANEOUS

## 2022-03-17 MED ORDER — ACETAMINOPHEN 650 MG RE SUPP: 650.0000 mg | Freq: Four times a day (QID) | RECTAL | Status: DC | PRN

## 2022-03-17 MED ORDER — SODIUM CHLORIDE 0.9 % IV SOLN
2.0000 g | INTRAVENOUS | Status: AC
  Administered 2022-03-18 – 2022-03-21 (×5): 2 g via INTRAVENOUS
  Filled 2022-03-17 (×5): qty 20

## 2022-03-17 MED ORDER — EZETIMIBE 10 MG PO TABS
10.0000 mg | ORAL_TABLET | Freq: Every day | ORAL | Status: DC
  Administered 2022-03-17 – 2022-03-24 (×8): 10 mg via ORAL
  Filled 2022-03-17 (×8): qty 1

## 2022-03-17 MED ORDER — ASPIRIN EC 81 MG PO TBEC
81.0000 mg | DELAYED_RELEASE_TABLET | Freq: Every day | ORAL | Status: DC
  Administered 2022-03-17 – 2022-03-24 (×8): 81 mg via ORAL
  Filled 2022-03-17 (×8): qty 1

## 2022-03-17 MED ORDER — BOOST / RESOURCE BREEZE PO LIQD CUSTOM
1.0000 | Freq: Three times a day (TID) | ORAL | Status: DC
  Administered 2022-03-17 – 2022-03-24 (×16): 1 via ORAL

## 2022-03-17 MED ORDER — ONDANSETRON HCL 4 MG/2ML IJ SOLN: 4.0000 mg | Freq: Four times a day (QID) | INTRAMUSCULAR | Status: DC | PRN

## 2022-03-17 MED ORDER — HEPARIN SODIUM (PORCINE) 5000 UNIT/ML IJ SOLN
5000.0000 [IU] | Freq: Three times a day (TID) | INTRAMUSCULAR | Status: DC
  Administered 2022-03-17 – 2022-03-24 (×22): 5000 [IU] via SUBCUTANEOUS
  Filled 2022-03-17 (×20): qty 1

## 2022-03-17 MED ORDER — METOPROLOL TARTRATE 50 MG PO TABS
50.0000 mg | ORAL_TABLET | Freq: Two times a day (BID) | ORAL | Status: DC
  Administered 2022-03-17 – 2022-03-24 (×14): 50 mg via ORAL
  Filled 2022-03-17 (×15): qty 1

## 2022-03-17 MED ORDER — PROSOURCE PLUS PO LIQD
30.0000 mL | Freq: Two times a day (BID) | ORAL | Status: DC
  Administered 2022-03-17 – 2022-03-24 (×13): 30 mL via ORAL
  Filled 2022-03-17 (×12): qty 30

## 2022-03-17 MED ORDER — ACETAMINOPHEN 325 MG PO TABS
650.0000 mg | ORAL_TABLET | Freq: Four times a day (QID) | ORAL | Status: DC | PRN
  Administered 2022-03-17 – 2022-03-24 (×4): 650 mg via ORAL
  Filled 2022-03-17 (×4): qty 2

## 2022-03-17 MED ORDER — OLANZAPINE 5 MG PO TABS
5.0000 mg | ORAL_TABLET | Freq: Every day | ORAL | Status: DC
  Administered 2022-03-17 – 2022-03-23 (×7): 5 mg via ORAL
  Filled 2022-03-17 (×7): qty 1

## 2022-03-17 MED ORDER — INSULIN ASPART 100 UNIT/ML IJ SOLN
0.0000 [IU] | Freq: Every day | INTRAMUSCULAR | Status: DC
  Administered 2022-03-18: 2 [IU] via SUBCUTANEOUS

## 2022-03-17 MED ORDER — ONDANSETRON HCL 4 MG PO TABS: 4.0000 mg | ORAL_TABLET | Freq: Four times a day (QID) | ORAL | Status: DC | PRN

## 2022-03-17 MED ORDER — OXYCODONE HCL 5 MG PO TABS
5.0000 mg | ORAL_TABLET | ORAL | Status: DC | PRN
  Administered 2022-03-19 – 2022-03-22 (×4): 5 mg via ORAL
  Filled 2022-03-17 (×5): qty 1

## 2022-03-17 MED ORDER — ROSUVASTATIN CALCIUM 20 MG PO TABS
40.0000 mg | ORAL_TABLET | Freq: Every day | ORAL | Status: DC
  Administered 2022-03-17 – 2022-03-24 (×8): 40 mg via ORAL
  Filled 2022-03-17 (×8): qty 2

## 2022-03-17 MED ORDER — TAMSULOSIN HCL 0.4 MG PO CAPS
0.4000 mg | ORAL_CAPSULE | Freq: Every day | ORAL | Status: DC
  Administered 2022-03-17 – 2022-03-24 (×8): 0.4 mg via ORAL
  Filled 2022-03-17 (×8): qty 1

## 2022-03-17 MED ORDER — SODIUM CHLORIDE 0.9 % IV SOLN
500.0000 mg | INTRAVENOUS | Status: AC
  Administered 2022-03-18 – 2022-03-22 (×5): 500 mg via INTRAVENOUS
  Filled 2022-03-17 (×5): qty 5

## 2022-03-17 MED ORDER — CLOPIDOGREL BISULFATE 75 MG PO TABS
75.0000 mg | ORAL_TABLET | Freq: Every day | ORAL | Status: DC
  Administered 2022-03-17 – 2022-03-24 (×8): 75 mg via ORAL
  Filled 2022-03-17 (×8): qty 1

## 2022-03-17 NOTE — Progress Notes (Signed)
Date and time results received: 03/17/22 '@1620'$  ?Test: troponin ?Critical Value: 572 ?Name of Provider Notified: Dr. Doristine Bosworth ?Orders Received? Or Actions Taken?: no new orders at this time ?Deirdre Pippins, RN  ?

## 2022-03-17 NOTE — Assessment & Plan Note (Signed)
-   Continue statin medication  

## 2022-03-17 NOTE — Progress Notes (Signed)
?PROGRESS NOTE ? ? ? ?Robert Osborne  GDJ:[id number] DOB: 11/26/1946 DOA: 03/16/2022 ?PCP: System, Provider Not In ? ? ?Brief Narrative:  ?HPI: Robert Osborne is a 76 y.o. male with medical history significant of with history of bladder cancer, diabetes mellitus type 2, hyperlipidemia, hypertension, behavioral disturbance, and more presents to ED with a chief complaint of myalgias and dyspnea.  Patient reports myalgias of been going on since September.  He does not describe them as a burning pain but an aching pain.  He got worse just hours prior to presentation.  Patient reports that his dyspnea started night before presentation.  He had associated wheezing and coughing productive of white sputum.  His dyspnea is exertional.  He had no associated chest pain, palpitations, dizziness/syncope.  Patient reports he has an inhaler and he used it 3 times during the day, and it only offered temporary relief each time.  Patient reports prior to his dyspnea starting he had an episode of nausea and vomiting with choking.  His last normal meal was breakfast on the day of presentation.  Last normal bowel movement was in the morning of the day of presentation.  Patient reports he does have a left lower quadrant pain.  It started last night after the nausea and vomiting.  It is intermittent.  It is not present now.  Patient also reports that he had dysuria a couple of weeks ago, he presented to his PCP who gave him some medication (presumably an antibiotic), and his pain went away.  Patient reports headaches today.  They are not both of his temple and go across the front of his head.  He has had no change in vision, no change in hearing.  Tylenol does help the pain.  Patient has no other complaints at this time. ?  ?Patient does not smoke, does not drink alcohol, does not use illicit drugs.  He is vaccinated for COVID.  Patient is full code. ? ?Assessment & Plan: ?  ?Principal Problem: ?  Sepsis ([place]) ?Active Problems: ?   Hyperlipidemia associated with type 2 diabetes mellitus ([place]) ?  Hypertension associated with diabetes ([place]) ?  Hypoalbuminemia due to protein-calorie malnutrition ([place]) ?  AKI (acute kidney injury) ([place]) ?  Uncontrolled type 2 diabetes mellitus with hyperglycemia ([place]) ?  CAP (community acquired pneumonia) ?  Elevated troponin ? ?Severe sepsis secondary to community-acquired pneumonia, POA: Patient meets criteria for severe sepsis due toTachycardia, tachypnea, leukocytosis 29.6 and lactic acidosis of 4.7 upon presentation.  Although no infiltrates are seen on the chest x-ray but patient does have a history of possible aspiration and lungs have coarse breath sounds with rhonchi bilaterally.  Repeat chest x-ray cannot exclude pneumonia.  Procalcitonin significantly elevated >150.  Sepsis could also be secondary to UTI.  UA is ordered but still pending.  Patient is on Rocephin and Zithromax which I will continue.  Lactic acidosis has now resolved.  We will follow cultures and tailor antibiotics accordingly.  Continue gentle hydration.  When I saw him this morning, patient had some twitching of the lower lip raising suspicion for some dysphagia.  I have consulted SLP. ? ?Elevated troponin: Patient has significantly elevated troponin 403> 484> 481.  Although significantly elevated but flat and patient not having any chest pain or shortness of breath suggest possible demand ischemia in the setting of sepsis however I will proceed with transthoracic echo to rule out wall motion abnormality.  Monitor on telemetry. ? ?Uncontrolled type 2 diabetes mellitus with  hyperglycemia ([place]) ?-5 units of basal insulin at home and 2 units of mealtime insulin at home ?-Holding basal insulin, continue sliding scale coverage.  Blood sugar labile but fairly controlled. ?Pending hemoglobin A1c ? ?AKI on CKD stage IIIa: Patient's baseline creatinine appears to be anywhere from 1.25-1.44 with a GFR around 49.  Presented with creatinine of  2.42.  Likely prerenal secondary to sepsis.  Continue gentle hydration and repeat labs in the morning.  Avoid nephrotoxic agents including Farxiga. ?  ?Hypoalbuminemia: Seen by nutrition, per them, patient does not meet criteria for malnutrition however he remains at high risk due to poor appetite. ? ?Hypertension: Blood pressure was soft earlier but now better.  Continue Imdur and resume Lopressor.  ? ?Hyperlipidemia associated with type 2 diabetes mellitus ([place]) ?- Continue statin medication ? ?DVT prophylaxis: heparin injection 5,000 Units Start: 03/17/22 0600 ?SCDs Start: 03/17/22 0136 ?  Code Status: Full Code  ?Family Communication:  None present at bedside.  Plan of care discussed with patient in length and he/she verbalized understanding and agreed with it. ? ?Status is: Inpatient ?Remains inpatient appropriate because: Patient is very sick ? ? ?Estimated body mass index is 24.28 kg/m? as calculated from the following: ?  Height as of this encounter: '5\' 7"'$  (1.702 m). ?  Weight as of this encounter: 70.3 kg. ? ?  ?Nutritional Assessment: ?Body mass index is 24.28 kg/m?Marland KitchenMarland Kitchen ?Seen by dietician.  I agree with the assessment and plan as outlined below: ?Nutrition Status: ?Nutrition Problem: Inadequate oral intake ?Etiology: poor appetite, vomiting ?Signs/Symptoms: meal completion < 50% ?Interventions: Boost Breeze, MVI, Prostat ? ?. ?Skin Assessment: ?I have examined the patient's skin and I agree with the wound assessment as performed by the wound care RN as outlined below: ?  ? ?Consultants:  ?None ? ?Procedures:  ?None ? ?Antimicrobials:  ?Anti-infectives (From admission, onward)  ? ? Start     Dose/Rate Route Frequency Ordered Stop  ? 03/18/22 0000  cefTRIAXone (ROCEPHIN) 2 g in sodium chloride 0.9 % 100 mL IVPB       ? 2 g ?200 mL/hr over 30 Minutes Intravenous Every 24 hours 03/17/22 0135 03/22/22 2359  ? 03/18/22 0000  azithromycin (ZITHROMAX) 500 mg in sodium chloride 0.9 % 250 mL IVPB       ? 500 mg ?250  mL/hr over 60 Minutes Intravenous Every 24 hours 03/17/22 0135 03/22/22 2359  ? 03/16/22 2330  cefTRIAXone (ROCEPHIN) 1 g in sodium chloride 0.9 % 100 mL IVPB       ? 1 g ?200 mL/hr over 30 Minutes Intravenous  Once 03/16/22 2326 03/17/22 0035  ? 03/16/22 2330  azithromycin (ZITHROMAX) 500 mg in sodium chloride 0.9 % 250 mL IVPB       ? 500 mg ?250 mL/hr over 60 Minutes Intravenous  Once 03/16/22 2326 03/17/22 0110  ? ?  ?  ? ? ?Subjective: ?Patient seen and examined.  He was sitting in the chair, he was alert and oriented however appeared to be very weak.  Was complaining of some shortness of breath but also stated that he is feeling better than yesterday.  He told me that he lives with his wife and is normally independent with his ADLs. ? ?Objective: ?Vitals:  ? 03/17/22 0432 03/17/22 0536 03/17/22 0806 03/17/22 1207  ?BP: 132/65 135/70 (!) 146/86 124/70  ?Pulse: 89 96 96 93  ?Resp: (!) '24 15 18 16  '$ ?Temp: 98.4 ?F (36.9 ?C) 99 ?F (37.2 ?C) 97.9 ?F (36.6 ?C)  98.4 ?F (36.9 ?C)  ?TempSrc: Oral Oral    ?SpO2: 96% 95% 96% 95%  ?Weight:      ?Height:      ? ? ?Intake/Output Summary (Last 24 hours) at 03/17/2022 1444 ?Last data filed at 03/17/2022 1300 ?Gross per 24 hour  ?Intake 3558.95 ml  ?Output 200 ml  ?Net 3358.95 ml  ? ?Filed Weights  ? 03/16/22 2130  ?Weight: 70.3 kg  ? ? ?Examination: ? ?General exam: Appears calm and comfortable with tremors in right upper hand and lower lip ?Respiratory system: Rhonchi bilaterally. Respiratory effort normal. ?Cardiovascular system: S1 & S2 heard, RRR. No JVD, murmurs, rubs, gallops or clicks. No pedal edema. ?Gastrointestinal system: Abdomen is nondistended, soft and nontender. No organomegaly or masses felt. Normal bowel sounds heard. ?Central nervous system: Alert and oriented. No focal neurological deficits. ?Extremities: Symmetric 5 x 5 power. ?Skin: No rashes, lesions or ulcers ?Psychiatry: Judgement and insight appear normal. Mood & affect appropriate.  ? ? ?Data Reviewed: I  have personally reviewed following labs and imaging studies ? ?CBC: ?Recent Labs  ?Lab 03/16/22 ?2221 03/17/22 ?9323  ?WBC 29.6* 28.3*  ?NEUTROABS  --  14.2*  ?HGB 12.8* 11.8*  ?HCT 42.2 38.3*  ?MCV 81.3 80.5  ?P

## 2022-03-17 NOTE — Progress Notes (Signed)
Patient voided in urinal but was thrown out by another nurse who was unaware of specimen being needed. Will attempt to obtain another sample.  ?

## 2022-03-17 NOTE — Assessment & Plan Note (Signed)
-   Albumin 3.3 ?-Secondary to poor p.o. intake most likely ?-Encourage nutrient dense food choices ?-Continue to monitor ?

## 2022-03-17 NOTE — Assessment & Plan Note (Signed)
-   Subjective fever, cough, shortness of breath ?-Patient reports recently treated for pneumonia 3 months ago ?-Concern for aspiration pneumonia given that his shortness of breath started after vomiting and then choking episode ?-Speech eval and treat ?-Continue Rocephin and Zithromax ?-Culture expectorated sputum ?-Blood cultures pending ?-Breathing treatments as needed ?-Continue to monitor ?

## 2022-03-17 NOTE — TOC Initial Note (Signed)
Transition of Care (TOC) - Initial/Assessment Note  ? ? ?Patient Details  ?Name: Robert Osborne ?MRN: [id number] ?Date of Birth: Mar 30, 1946 ? ?Transition of Care (TOC) CM/SW Contact:    ?Salome Arnt, LCSW ?Phone Number: ?03/17/2022, 11:23 AM ? ?Clinical Narrative:  Pt admitted due to sepsis. TOC completed assessment for high risk readmission score. Pt reports he lives with his wife and is independent with ADLs. He ambulates with a cane or a walker at baseline. Pt indicates his PCP is in [place]. He plans to return home when medically stable. No current home health services. TOC will continue to follow and assist with d/c planning.                ? ? ?Expected Discharge Plan: Home/Self Care ?Barriers to Discharge: Continued Medical Work up ? ? ?Patient Goals and CMS Choice ?Patient states their goals for this hospitalization and ongoing recovery are:: return home ?  ?Choice offered to / list presented to : Patient ? ?Expected Discharge Plan and Services ?Expected Discharge Plan: Home/Self Care ?In-house Referral: Clinical Social Work ?  ?  ?Living arrangements for the past 2 months: [place] ?                ?  ?  ?  ?  ?  ?  ?  ?  ?  ?  ? ?Prior Living Arrangements/Services ?Living arrangements for the past 2 months: [place] ?Lives with:: Spouse ?Patient language and need for interpreter reviewed:: Yes ?Do you feel safe going back to the place where you live?: Yes      ?Need for Family Participation in Patient Care: No (Comment) ?  ?Current home services: DME (cane, walker) ?Criminal Activity/Legal Involvement Pertinent to Current Situation/Hospitalization: No - Comment as needed ? ?Activities of Daily Living ?Home Assistive Devices/Equipment: Cane (specify quad or straight), CBG Meter, Walker (specify type) ?ADL Screening (condition at time of admission) ?Patient's cognitive ability adequate to safely complete daily activities?: Yes ?Is the patient deaf or have difficulty hearing?:  No ?Does the patient have difficulty seeing, even when wearing glasses/contacts?: Yes ?Does the patient have difficulty concentrating, remembering, or making decisions?: No ?Patient able to express need for assistance with ADLs?: Yes ?Does the patient have difficulty dressing or bathing?: Yes ?Independently performs ADLs?: Yes (appropriate for developmental age) ?Does the patient have difficulty walking or climbing stairs?: Yes ?Weakness of Legs: Both ?Weakness of Arms/Hands: Both ? ?Permission Sought/Granted ?  ?  ?   ?   ?   ?   ? ?Emotional Assessment ?  ?  ?Affect (typically observed): Appropriate ?Orientation: : Oriented to Self, Oriented to Place, Oriented to  Time, Oriented to Situation ?Alcohol / Substance Use: Not Applicable ?Psych Involvement: No (comment) ? ?Admission diagnosis:  Shortness of breath [R06.02] ?Thrombocytopenia ([place]) [D69.6] ?Non-productive cough [R05.8] ?AKI (acute kidney injury) ([place]) [N17.9] ?Sepsis ([place]) [A41.9] ?Other elevated white blood cell (WBC) count [D72.828] ?Patient Active Problem List  ? Diagnosis Date Noted  ? CAP (community acquired pneumonia) 03/17/2022  ? Elevated troponin 03/17/2022  ? Sepsis ([place]) 03/16/2022  ? NSTEMI (non-ST elevated myocardial infarction) ([place]) 09/23/2021  ? S/P CABG x 4 08/23/2021  ? Acute anterior wall MI ([place]) 08/16/2021  ? Chest pain 08/15/2021  ? Right upper lobe pulmonary nodule 08/15/2021  ? Hypoalbuminemia due to protein-calorie malnutrition ([place]) 08/15/2021  ? AKI (acute kidney injury) ([place]) 08/15/2021  ? Hyperglycemia due to diabetes mellitus ([place]) 08/15/2021  ? BPH (benign  prostatic hyperplasia) 08/15/2021  ? Hypertension associated with diabetes ([place]) 06/25/2021  ? Male erectile disorder 03/05/2021  ? Diabetic polyneuropathy associated with type 2 diabetes mellitus ([place]) 05/07/2020  ? Frontotemporal dementia with behavioral disturbance ([place]) 01/07/2020  ? Noncompliance with diabetes treatment 01/07/2020  ? Generalized weakness   ?  Neurological deficit present   ? Facial droop 08/17/2019  ? Hx of bladder cancer 07/29/2019  ? Abnormal stress test 08/11/2018  ? PVC's (premature ventricular contractions) 08/11/2018  ? Hypokalemia 11/27/2017  ? Hypomagnesemia 11/27/2017  ? Major frontotemporal neurocognitive disorder, probable, with behavioral disturbance 11/27/2017  ? Uncontrolled type 2 diabetes mellitus with hyperglycemia ([place]) 11/27/2017  ? Erectile dysfunction 10/24/2016  ? Bladder cancer ([place]) 08/18/2013  ? Diabetes mellitus ([place])   ? Vitamin D deficiency 05/17/2013  ? Hyperlipidemia associated with type 2 diabetes mellitus ([place]) 05/17/2013  ? ?PCP:  System, Provider Not In ?Pharmacy:   ?[id number] [address] [place]?Phone: [phone]: [phone]? ? ? ? ?Social Determinants of Health (SDOH) Interventions ?  ? ?Readmission Risk Interventions ? ?  03/17/2022  ? 11:21 AM 08/27/2021  ? 12:30 PM  ?Readmission Risk Prevention Plan  ?Transportation Screening Complete Complete  ?PCP or Specialist Appt within 3-5 Days  Complete  ?HParkeror Home Care Consult  Complete  ?Social Work Consult for RSorrentoPlanning/Counseling  Complete  ?Palliative Care Screening  Not Applicable  ?Medication Review (Press photographer Complete Complete  ?HBoydor Home Care Consult Complete   ?SW Recovery Care/Counseling Consult Complete   ?Palliative Care Screening Not Applicable   ?SCliffordNot Applicable   ? ? ? ?

## 2022-03-17 NOTE — Progress Notes (Signed)
Initial Nutrition Assessment ? ?DOCUMENTATION CODES:  ? ?Not applicable ? ?INTERVENTION:  ?- Encourage PO intake ?- Boost Breeze po TID, each supplement provides 250 kcal and 9 grams of protein ?- 30 ml ProSource Plus BID, each supplement provides 100 kcals and 15 grams protein.  ?- MVI with minerals daily ? ?NUTRITION DIAGNOSIS:  ? ?Inadequate oral intake related to poor appetite, vomiting as evidenced by meal completion < 50%. ? ?GOAL:  ? ?Patient will meet greater than or equal to 90% of their needs ? ?MONITOR:  ? ?PO intake, Supplement acceptance, Labs, Weight trends ? ?REASON FOR ASSESSMENT:  ? ?Malnutrition Screening Tool ?  ? ?ASSESSMENT:  ? ?Pt admitted from home with myalgia and dyspnea secondary to CAP with concern for aspiration PNA. PMH significant for bladder cancer, T2DM, HLD, HTN and behavioral disturbance. ? ?Pt reports a very poor appetite for the past 3-4 weeks which he attributes to PNA. He has been experiencing emesis for the last week of which he reports minimal improvement. He reports taking very few bites of food within the last month which includes apple sauce, chocolate pudding and sprite. He is familiar with nutrition supplements but does not enjoy Ensure. He is agreeable to trying [employer] and ProSource for additional protein intake. Discussed trying to add electrolyte containing beverages at home during period of emesis and poor PO intake to help aid in dehydration.  ? ?1 documented meal completion of 25% ? ?Pt states that he has been less mobile within the last few weeks d/t progressive worsening of his illness. He did not provide weight history. Reviewed weight documentation. Weights appear stable. Current admit weight noted to be 70.3 kg (155 lbs). Will continue to monitor throughout admission. ? ?Pt does not meet criteria at this time but he is at high risk for malnutrition given poor PO intake. ? ?Medications: SSI, IV abx ? ?Labs: CBG's 107-121 x6 hours, BUN 43, Cr 2.42, AST  116 ? ?NUTRITION - FOCUSED PHYSICAL EXAM: ? ?Flowsheet Row Most Recent Value  ?Orbital Region Mild depletion  ?Upper Arm Region No depletion  ?Thoracic and Lumbar Region No depletion  ?Buccal Region No depletion  ?Temple Region No depletion  ?Clavicle Bone Region No depletion  ?Clavicle and Acromion Bone Region No depletion  ?Scapular Bone Region Mild depletion  ?Dorsal Hand Mild depletion  ?Patellar Region Mild depletion  ?Anterior Thigh Region Mild depletion  ?Posterior Calf Region No depletion  ?Edema (RD Assessment) None  ?Hair Reviewed  ?Eyes Reviewed  ?Mouth Reviewed  ?Skin Reviewed  ?Nails Reviewed  ? ?  ? ?Diet Order:   ?Diet Order   ? ?       ?  Diet Carb Modified Fluid consistency: Thin; Room service appropriate? Yes  Diet effective now       ?  ? ?  ?  ? ?  ? ? ?EDUCATION NEEDS:  ? ?Education needs have been addressed ? ?Skin:  Skin Assessment: Reviewed RN Assessment ? ?Last BM:  4/2 ? ?Height:  ? ?Ht Readings from Last 1 Encounters:  ?03/16/22 '5\' 7"'$  (1.702 m)  ? ? ?Weight:  ? ?Wt Readings from Last 1 Encounters:  ?03/16/22 70.3 kg  ? ?BMI:  Body mass index is 24.28 kg/m?. ? ?Estimated Nutritional Needs:  ? ?Kcal:  1800-2000 ? ?Protein:  90-105g ? ?Fluid:  >/=1.8L ? ?Clayborne Dana, RDN, LDN ?Clinical Nutrition ?

## 2022-03-17 NOTE — Assessment & Plan Note (Addendum)
-   Hold Lopressor ?-Soft blood pressures with systolic in the 73S ?-Initially in the ED recorded blood pressures are inaccurate as there was a malfunction with the cuff ?-Restart Farxiga at discharge ?

## 2022-03-17 NOTE — Assessment & Plan Note (Signed)
-  5 units of basal insulin at home and 2 units of mealtime insulin at home ?-Holding basal insulin, continue sliding scale coverage ?-Update hemoglobin A1c ?-Carb modified diet ?-Monitor CBGs ?

## 2022-03-17 NOTE — Evaluation (Signed)
Clinical/Bedside Swallow Evaluation ?Patient Details  ?Name: Robert Osborne ?MRN: [id number] ?Date of Birth: 29-Apr-1946 ? ?Today's Date: 03/17/2022 ?Time: SLP Start Time (ACUTE ONLY): 1502 SLP Stop Time (ACUTE ONLY): 1526 ?SLP Time Calculation (min) (ACUTE ONLY): 24 min ? ?Past Medical History:  ?Past Medical History:  ?Diagnosis Date  ? Bladder cancer ([place]) 08/18/2013  ? Cancer [facility])   ? papillary urethral carcinoma,low grade, non-invasive  ? Diabetes mellitus without complication ([place])   ? Erectile dysfunction 10/24/2016  ? Hyperlipidemia associated with type 2 diabetes mellitus ([place]) 05/17/2013  ? Hypertension associated with diabetes ([place])   ? Hypokalemia 11/27/2017  ? Hypomagnesemia 11/27/2017  ? Major frontotemporal neurocognitive disorder, probable, with behavioral disturbance 11/27/2017  ? Vitamin D deficiency 05/17/2013  ? ?Past Surgical History:  ?Past Surgical History:  ?Procedure Laterality Date  ? COLONOSCOPY W/ POLYPECTOMY    ? CORONARY ARTERY BYPASS GRAFT N/A 08/22/2021  ? Procedure: CORONARY ARTERY BYPASS GRAFTING (CABG), ON PUMP, TIMES FOUR , USING LEFT INTERNAL MAMMARY ARTERY AND ENDOSCOPICALLY HARVESTED RIGHT GREATER SAPHENOUS VEIN;  Surgeon: Lajuana Matte, MD;  Location: [facility];  Service: Open Heart Surgery;  Laterality: N/A;  ? CORONARY BALLOON ANGIOPLASTY N/A 08/16/2021  ? Procedure: CORONARY BALLOON ANGIOPLASTY;  Surgeon: Jettie Booze, MD;  Location: [facility];  Service: Cardiovascular;  Laterality: N/A;  ? ENDOVEIN HARVEST OF GREATER SAPHENOUS VEIN  08/22/2021  ? Procedure: ENDOVEIN HARVEST OF GREATER SAPHENOUS VEIN;  Surgeon: Lajuana Matte, MD;  Location: [facility];  Service: Open Heart Surgery;;  ? LEFT HEART CATH AND CORONARY ANGIOGRAPHY N/A 08/16/2021  ? Procedure: LEFT HEART CATH AND CORONARY ANGIOGRAPHY;  Surgeon: Jettie Booze, MD;  Location: [place] [facility] [facility];  Service: Cardiovascular;  Laterality: N/A;  ? TEE WITHOUT CARDIOVERSION N/A 08/22/2021  ? Procedure:  TRANSESOPHAGEAL ECHOCARDIOGRAM (TEE);  Surgeon: Lajuana Matte, MD;  Location: [facility];  Service: Open Heart Surgery;  Laterality: N/A;  ? ?HPI:  ?Robert Osborne is a 76 y.o. male with medical history significant of with history of bladder cancer, diabetes mellitus type 2, hyperlipidemia, hypertension, behavioral disturbance, and more presents to ED with a chief complaint of myalgias and dyspnea.  Patient reports myalgias of been going on since September.  He does not describe them as a burning pain but an aching pain.  He got worse just hours prior to presentation.  Patient reports that his dyspnea started night before presentation.  He had associated wheezing and coughing productive of white sputum.  His dyspnea is exertional.  He had no associated chest pain, palpitations, dizziness/syncope.  Patient reports he has an inhaler and he used it 3 times during the day, and it only offered temporary relief each time.  Patient reports prior to his dyspnea starting he had an episode of nausea and vomiting with choking.  His last normal meal was breakfast on the day of presentation.  Last normal bowel movement was in the morning of the day of presentation.  Patient reports he does have a left lower quadrant pain.  It started last night after the nausea and vomiting.  It is intermittent.  It is not present now.  Patient also reports that he had dysuria a couple of weeks ago, he presented to his PCP who gave him some medication (presumably an antibiotic), and his pain went away.  Patient reports headaches today.  They are not both of his temple and go across the front of his head.  He has had no change  in vision, no change in hearing.  Tylenol does help the pain.  Patient has no other complaints at this time. BSE requested.  ?  ?Assessment / Plan / Recommendation  ?Clinical Impression ? Clinical swallow evaluation completed at bedside. Pt alert and cooperative and denies difficulty swallowing. He reports emesis for the  past several days. He was seen by SLP after his CABG in September 2022, but was discharged on regular textures and thin liquids. Oral motor examination is WNL. Pt consumed thin via cup/straw, puree, and regular textures and exhibits seemingly functional swallow without overt signs or symptoms of aspiration and no reports of globus. Recommend regular textures and thin liquids with standard aspiration and reflux precautions, po medications whole with water. No further SLP services indicated at this time. Reconsult as needed. ?SLP Visit Diagnosis: Dysphagia, unspecified (R13.10) ?   ?Aspiration Risk ? No limitations  ?  ?Diet Recommendation Regular;Thin liquid  ? ?Liquid Administration via: Cup;Straw ?Medication Administration: Whole meds with liquid ?Supervision: Patient able to self feed ?Postural Changes: Seated upright at 90 degrees;Remain upright for at least 30 minutes after po intake  ?  ?Other  Recommendations Oral Care Recommendations: Oral care BID ?Other Recommendations: Clarify dietary restrictions   ? ?Recommendations for follow up therapy are one component of a multi-disciplinary discharge planning process, led by the attending physician.  Recommendations may be updated based on patient status, additional functional criteria and insurance authorization. ? ?Follow up Recommendations No SLP follow up  ? ? ?  ?Assistance Recommended at Discharge None  ?Functional Status Assessment Patient has not had a recent decline in their functional status  ?Frequency and Duration    ?  ?  ?   ? ?Prognosis Prognosis for Safe Diet Advancement: Good  ? ?  ? ?Swallow Study   ?General Date of Onset: 03/16/22 ?HPI: Robert Osborne is a 76 y.o. male with medical history significant of with history of bladder cancer, diabetes mellitus type 2, hyperlipidemia, hypertension, behavioral disturbance, and more presents to ED with a chief complaint of myalgias and dyspnea.  Patient reports myalgias of been going on since September.   He does not describe them as a burning pain but an aching pain.  He got worse just hours prior to presentation.  Patient reports that his dyspnea started night before presentation.  He had associated wheezing and coughing productive of white sputum.  His dyspnea is exertional.  He had no associated chest pain, palpitations, dizziness/syncope.  Patient reports he has an inhaler and he used it 3 times during the day, and it only offered temporary relief each time.  Patient reports prior to his dyspnea starting he had an episode of nausea and vomiting with choking.  His last normal meal was breakfast on the day of presentation.  Last normal bowel movement was in the morning of the day of presentation.  Patient reports he does have a left lower quadrant pain.  It started last night after the nausea and vomiting.  It is intermittent.  It is not present now.  Patient also reports that he had dysuria a couple of weeks ago, he presented to his PCP who gave him some medication (presumably an antibiotic), and his pain went away.  Patient reports headaches today.  They are not both of his temple and go across the front of his head.  He has had no change in vision, no change in hearing.  Tylenol does help the pain.  Patient has no other complaints at  this time. BSE requested. ?Type of Study: Bedside Swallow Evaluation ?Previous Swallow Assessment: 08/23/2021 s/p CABG ?Diet Prior to this Study: Regular ?Temperature Spikes Noted: No ?Respiratory Status: Room air ?History of Recent Intubation: No ?Behavior/Cognition: Alert;Cooperative;Pleasant mood ?Oral Cavity Assessment: Within Functional Limits ?Oral Care Completed by SLP: No ?Oral Cavity - Dentition: Adequate natural dentition ?Vision: Functional for self-feeding ?Self-Feeding Abilities: Able to feed self ?Patient Positioning: Upright in bed ?Baseline Vocal Quality: Normal ?Volitional Cough: Strong ?Volitional Swallow: Able to elicit  ?  ?Oral/Motor/Sensory Function Overall Oral  Motor/Sensory Function: Within functional limits   ?Ice Chips Ice chips: Within functional limits ?Presentation: Spoon   ?Thin Liquid Thin Liquid: Within functional limits ?Presentation: Cup;Self Fed;Straw  ?

## 2022-03-17 NOTE — H&P (Signed)
?History and Physical  ? ? ?Patient: Robert Osborne SWN:[id number] DOB: Feb 15, 1946 ?DOA: 03/16/2022 ?DOS: the patient was seen and examined on 03/17/2022 ?PCP: System, Provider Not In  ?Patient coming from: Home ? ?Chief Complaint:  ?Chief Complaint  ?Patient presents with  ? Shortness of Breath  ? ?HPI: Robert Osborne is a 76 y.o. male with medical history significant of with history of bladder cancer, diabetes mellitus type 2, hyperlipidemia, hypertension, behavioral disturbance, and more presents to ED with a chief complaint of myalgias and dyspnea.  Patient reports myalgias of been going on since September.  He does not describe them as a burning pain but an aching pain.  He got worse just hours prior to presentation.  Patient reports that his dyspnea started night before presentation.  He had associated wheezing and coughing productive of white sputum.  His dyspnea is exertional.  He had no associated chest pain, palpitations, dizziness/syncope.  Patient reports he has an inhaler and he used it 3 times during the day, and it only offered temporary relief each time.  Patient reports prior to his dyspnea starting he had an episode of nausea and vomiting with choking.  His last normal meal was breakfast on the day of presentation.  Last normal bowel movement was in the morning of the day of presentation.  Patient reports he does have a left lower quadrant pain.  It started last night after the nausea and vomiting.  It is intermittent.  It is not present now.  Patient also reports that he had dysuria a couple of weeks ago, he presented to his PCP who gave him some medication (presumably an antibiotic), and his pain went away.  Patient reports headaches today.  They are not both of his temple and go across the front of his head.  He has had no change in vision, no change in hearing.  Tylenol does help the pain.  Patient has no other complaints at this time. ? ?Patient does not smoke, does not drink alcohol, does not  use illicit drugs.  He is vaccinated for COVID.  Patient is full code. ?Review of Systems: As mentioned in the history of present illness. All other systems reviewed and are negative. ?Past Medical History:  ?Diagnosis Date  ? Bladder cancer ([place]) 08/18/2013  ? Cancer [facility]   ? papillary urethral carcinoma,low grade, non-invasive  ? Diabetes mellitus without complication ([place])   ? Erectile dysfunction 10/24/2016  ? Hyperlipidemia associated with type 2 diabetes mellitus ([place]) 05/17/2013  ? Hypertension associated with diabetes ([place])   ? Hypokalemia 11/27/2017  ? Hypomagnesemia 11/27/2017  ? Major frontotemporal neurocognitive disorder, probable, with behavioral disturbance 11/27/2017  ? Vitamin D deficiency 05/17/2013  ? ?Past Surgical History:  ?Procedure Laterality Date  ? COLONOSCOPY W/ POLYPECTOMY    ? CORONARY ARTERY BYPASS GRAFT N/A 08/22/2021  ? Procedure: CORONARY ARTERY BYPASS GRAFTING (CABG), ON PUMP, TIMES FOUR , USING LEFT INTERNAL MAMMARY ARTERY AND ENDOSCOPICALLY HARVESTED RIGHT GREATER SAPHENOUS VEIN;  Surgeon: Lajuana Matte, MD;  Location: [place];  Service: Open Heart Surgery;  Laterality: N/A;  ? CORONARY BALLOON ANGIOPLASTY N/A 08/16/2021  ? Procedure: CORONARY BALLOON ANGIOPLASTY;  Surgeon: Jettie Booze, MD;  Location: [place] CV [facility];  Service: Cardiovascular;  Laterality: N/A;  ? ENDOVEIN HARVEST OF GREATER SAPHENOUS VEIN  08/22/2021  ? Procedure: ENDOVEIN HARVEST OF GREATER SAPHENOUS VEIN;  Surgeon: Lajuana Matte, MD;  Location: [place];  Service: Open Heart Surgery;;  ? LEFT HEART CATH AND  CORONARY ANGIOGRAPHY N/A 08/16/2021  ? Procedure: LEFT HEART CATH AND CORONARY ANGIOGRAPHY;  Surgeon: Jettie Booze, MD;  Location: [place] CV [facility];  Service: Cardiovascular;  Laterality: N/A;  ? TEE WITHOUT CARDIOVERSION N/A 08/22/2021  ? Procedure: TRANSESOPHAGEAL ECHOCARDIOGRAM (TEE);  Surgeon: Lajuana Matte, MD;  Location: [place];  Service: Open Heart Surgery;  Laterality: N/A;   ? ?Social History:  reports that he quit smoking about 48 years ago. His smoking use included cigarettes. He has never used smokeless tobacco. He reports that he does not drink alcohol and does not use drugs. ? ?Allergies  ?Allergen Reactions  ? Enalapril Cough  ? ? ?Family History  ?Problem Relation Age of Onset  ? Heart attack Maternal Grandmother   ? Stroke Maternal Grandfather   ? Heart attack Mother 50  ? ? ?Prior to Admission medications   ?Medication Sig Start Date End Date Taking? Authorizing Provider  ?aspirin EC 81 MG EC tablet Take 1 tablet (81 mg total) by mouth daily. Swallow whole. 08/30/21   Barrett, Erin R, PA-C  ?blood glucose meter kit and supplies KIT Dispense based on patient and insurance preference. Use up to twice a daily as directed. (FOR ICD-10 - type 2 DM uncontrolled E11.9) 08/19/19   Murlean Iba, MD  ?blood glucose meter kit and supplies Dispense based on patient and insurance preference. Use up to four times daily as directed. (FOR ICD-10 E10.9, E11.9). 08/23/19   Sharion Balloon, FNP  ?blood glucose meter kit and supplies Use up to four times daily as directed. dx E11.9 08/23/19   Terald Sleeper, PA-C  ?Blood Glucose Monitoring Suppl (ONE TOUCH ULTRA 2) w/Device KIT USE TO CHECK BLOOD SUGAR UP TO 4 TIMES DAILY AS DIRECTED 05/15/20   Sharion Balloon, FNP  ?clopidogrel (PLAVIX) 75 MG tablet Take 1 tablet (75 mg total) by mouth daily. 08/30/21   Barrett, Erin R, PA-C  ?dapagliflozin propanediol (FARXIGA) 10 MG TABS tablet Take 1 tablet (10 mg total) by mouth daily. 08/30/21   Barrett, Erin R, PA-C  ?ezetimibe (ZETIA) 10 MG tablet Take 1 tablet (10 mg total) by mouth daily. 10/17/21 01/20/22  Almyra Deforest, PA  ?glucose blood test strip Use as instructed 05/15/20   Sharion Balloon, FNP  ?insulin aspart (NOVOLOG) 100 UNIT/ML FlexPen Inject 9 Units into the skin 2 (two) times daily with a meal. ?Patient not taking: Reported on 01/20/2022 01/10/20   Sharion Balloon, FNP  ?insulin degludec (TRESIBA  FLEXTOUCH) 100 UNIT/ML SOPN FlexTouch Pen Inject 0.05 mLs (5 Units total) into the skin daily. ?Patient taking differently: Inject 18 Units into the skin daily. 08/10/19   Sharion Balloon, FNP  ?Insulin Pen Needle (PEN NEEDLES) 32G X 4 MM MISC 100 each by Does not apply route 4 (four) times daily. E10.9 05/16/20   Sharion Balloon, FNP  ?isosorbide mononitrate (IMDUR) 60 MG 24 hr tablet Take 1 tablet (60 mg total) by mouth daily. 09/10/21   Almyra Deforest, [place]  ?Lancets (ONETOUCH DELICA PLUS [id number]) MISC USE TO CHECK BLOOD SUGAR UP TO 4 TIMES A DAY AS DIRECTED 01/23/20   Sharion Balloon, FNP  ?levofloxacin (LEVAQUIN) 500 MG tablet Take 1 tablet (500 mg total) by mouth daily. ?Patient not taking: Reported on 01/20/2022 10/21/21   Blue, Soijett A, PA-C  ?metoprolol tartrate (LOPRESSOR) 50 MG tablet Take 1 tablet (50 mg total) by mouth 2 (two) times daily. 02/28/22   Minus Breeding, MD  ?Multiple Vitamin (MULTIVITAMIN WITH  MINERALS) TABS tablet Take 1 tablet by mouth daily.    [provider]  ?nitroGLYCERIN (NITROSTAT) 0.4 MG SL tablet Place 0.4 mg under the tongue every 5 (five) minutes as needed for chest pain.    [provider]  ?OLANZapine (ZYPREXA) 5 MG tablet Take 5 mg by mouth at bedtime.    [provider]  ?oxyCODONE (OXY IR/ROXICODONE) 5 MG immediate release tablet Take 1-2 tablets (5-10 mg total) by mouth every 4 (four) hours as needed for severe pain. ?Patient not taking: Reported on 01/20/2022 09/06/21   Lajuana Matte, MD  ?rosuvastatin (CRESTOR) 40 MG tablet Take 1 tablet (40 mg total) by mouth daily. 08/30/21   Barrett, Erin R, PA-C  ?tamsulosin (FLOMAX) 0.4 MG CAPS capsule Take 0.4 mg by mouth daily.    [provider]  ? ? ?Physical Exam: ?Vitals:  ? 03/17/22 0000 03/17/22 0030 03/17/22 0100 03/17/22 0136  ?BP: 112/73 101/67 104/70 (!) 155/87  ?Pulse: 96 91 89 88  ?Resp: (!) 30 (!) 27 (!) 25 20  ?Temp:   98.3 ?F (36.8 ?C)   ?TempSrc:      ?SpO2: 97% 95% 98% 92%  ?Weight:       ?Height:      ? ?1.  General: ?Patient lying supine in bed,  no acute distress ?  ?2. Psychiatric: ?Alert and oriented x 3, flat affect, behavior normal for situation, pleasant and cooperative with exam ?

## 2022-03-17 NOTE — Assessment & Plan Note (Signed)
-   Likely leak in the setting of sepsis ?-No chest pain ?-EKG pending ?-Continue to trend ?-Continue medical optimization with aspirin, Plavix, statin, Imdur ?-Holding beta-blocker in the setting of soft blood pressures ?-Restart Farxiga at discharge ?

## 2022-03-17 NOTE — Progress Notes (Signed)
Date and time results received: 03/17/22 0130 ?(use smartphrase ".now" to insert current time) ? ?Test: lactic acid ?Critical Value: 5.0 ? ?Name of Provider Notified: Dr. Clearence Ped ? ?Orders Received? Or Actions Taken?:    ? ?No new orders, patient getting fluids at this time.  ?

## 2022-03-17 NOTE — Assessment & Plan Note (Signed)
-  Creatinine at baseline 1.55 ?-Today creatinine is 2.42 ?-Bicarb is decreased to 18 ?-Prerenal given dehydration/sepsis state ?-Continue fluids ?-Trend in the a.m. ?-Avoid nephrotoxic agents when possible ?

## 2022-03-17 NOTE — Sepsis Progress Note (Signed)
Following for sepsis monitoring ?

## 2022-03-17 NOTE — Assessment & Plan Note (Signed)
-   Tachycardia, tachypnea, leukocytosis 29.6 ?-AKI and lactic acidosis ?-Most likely source is pneumonia given patient's dyspnea, cough, subjective fever ?-No infiltrate seen on chest x-ray, will repeat x-ray after adequate hydration ?-Other possible source is urine ?-UA and urine culture pending ?-Rocephin and Zithromax will cover both pneumonia and urine ?-Sepsis order set utilized ?-Check Pro-Cal in the a.m. ?-2.75 bolus ordered in the ED ?-Continue LR at 150 mL/h ?-Monitor closely for fluid overload ?-Continue to monitor ?

## 2022-03-18 ENCOUNTER — Inpatient Hospital Stay (HOSPITAL_COMMUNITY): Payer: Medicare HMO

## 2022-03-18 DIAGNOSIS — A419 Sepsis, unspecified organism: Secondary | ICD-10-CM | POA: Diagnosis not present

## 2022-03-18 DIAGNOSIS — R0609 Other forms of dyspnea: Secondary | ICD-10-CM

## 2022-03-18 DIAGNOSIS — R778 Other specified abnormalities of plasma proteins: Secondary | ICD-10-CM

## 2022-03-18 DIAGNOSIS — R6520 Severe sepsis without septic shock: Secondary | ICD-10-CM | POA: Diagnosis not present

## 2022-03-18 DIAGNOSIS — N179 Acute kidney failure, unspecified: Secondary | ICD-10-CM | POA: Diagnosis not present

## 2022-03-18 LAB — CBC WITH DIFFERENTIAL/PLATELET
Band Neutrophils: 7 %
Band Neutrophils: 6 %
Basophils Absolute: 0 10*3/uL (ref 0.0–0.1)
Basophils Absolute: 0 10*3/uL (ref 0.0–0.1)
Basophils Relative: 0 %
Basophils Relative: 0 %
Eosinophils Absolute: 0 10*3/uL (ref 0.0–0.5)
Eosinophils Absolute: 2.1 10*3/uL — ABNORMAL HIGH (ref 0.0–0.5)
Eosinophils Relative: 0 %
Eosinophils Relative: 10 %
HCT: 38.3 % — ABNORMAL LOW (ref 39.0–52.0)
HCT: 38.7 % — ABNORMAL LOW (ref 39.0–52.0)
Hemoglobin: 11.8 g/dL — ABNORMAL LOW (ref 13.0–17.0)
Hemoglobin: 12.3 g/dL — ABNORMAL LOW (ref 13.0–17.0)
Lymphocytes Relative: 8 %
Lymphocytes Relative: 3 %
Lymphs Abs: 2.3 10*3/uL (ref 0.7–4.0)
Lymphs Abs: 0.6 10*3/uL — ABNORMAL LOW (ref 0.7–4.0)
MCH: 24.8 pg — ABNORMAL LOW (ref 26.0–34.0)
MCH: 25.3 pg — ABNORMAL LOW (ref 26.0–34.0)
MCHC: 30.8 g/dL (ref 30.0–36.0)
MCHC: 31.8 g/dL (ref 30.0–36.0)
MCV: 80.5 fL (ref 80.0–100.0)
MCV: 79.5 fL — ABNORMAL LOW (ref 80.0–100.0)
Metamyelocytes Relative: 1 %
Monocytes Absolute: 1.4 10*3/uL — ABNORMAL HIGH (ref 0.1–1.0)
Monocytes Absolute: 0.2 10*3/uL (ref 0.1–1.0)
Monocytes Relative: 5 %
Monocytes Relative: 1 %
Neutro Abs: 24.6 10*3/uL — ABNORMAL HIGH (ref 1.7–7.7)
Neutro Abs: 18 10*3/uL — ABNORMAL HIGH (ref 1.7–7.7)
Neutrophils Relative %: 80 %
Neutrophils Relative %: 79 %
Platelets: 121 10*3/uL — ABNORMAL LOW (ref 150–400)
Platelets: 113 10*3/uL — ABNORMAL LOW (ref 150–400)
RBC: 4.76 MIL/uL (ref 4.22–5.81)
RBC: 4.87 MIL/uL (ref 4.22–5.81)
RDW: 17.3 % — ABNORMAL HIGH (ref 11.5–15.5)
RDW: 17.7 % — ABNORMAL HIGH (ref 11.5–15.5)
WBC: 28.3 10*3/uL — ABNORMAL HIGH (ref 4.0–10.5)
WBC: 21.2 10*3/uL — ABNORMAL HIGH (ref 4.0–10.5)
nRBC: 0 % (ref 0.0–0.2)
nRBC: 0 % (ref 0.0–0.2)

## 2022-03-18 LAB — BASIC METABOLIC PANEL
Anion gap: 9 (ref 5–15)
BUN: 44 mg/dL — ABNORMAL HIGH (ref 8–23)
CO2: 19 mmol/L — ABNORMAL LOW (ref 22–32)
Calcium: 8.2 mg/dL — ABNORMAL LOW (ref 8.9–10.3)
Chloride: 113 mmol/L — ABNORMAL HIGH (ref 98–111)
Creatinine, Ser: 2.13 mg/dL — ABNORMAL HIGH (ref 0.61–1.24)
GFR, Estimated: 31 mL/min — ABNORMAL LOW (ref >60)
Glucose, Bld: 95 mg/dL (ref 70–99)
Potassium: 3.9 mmol/L (ref 3.5–5.1)
Sodium: 141 mmol/L (ref 135–145)

## 2022-03-18 LAB — PROCALCITONIN: Procalcitonin: 85.67 ng/mL

## 2022-03-18 LAB — ECHOCARDIOGRAM COMPLETE
AR max vel: 2.63 cm2
AV Area VTI: 2.67 cm2
AV Area mean vel: 2.39 cm2
AV Mean grad: 3 mmHg
AV Peak grad: 4.9 mmHg
Ao pk vel: 1.11 m/s
Area-P 1/2: 2.82 cm2
Calc EF: 56.4 %
Height: 67 in
MV VTI: 1.76 cm2
S' Lateral: 3 cm
Single Plane A2C EF: 45.6 %
Single Plane A4C EF: 64.2 %
Weight: 2480 oz

## 2022-03-18 LAB — GLUCOSE, CAPILLARY
Glucose-Capillary: 88 mg/dL (ref 70–99)
Glucose-Capillary: 263 mg/dL — ABNORMAL HIGH (ref 70–99)
Glucose-Capillary: 161 mg/dL — ABNORMAL HIGH (ref 70–99)
Glucose-Capillary: 249 mg/dL — ABNORMAL HIGH (ref 70–99)

## 2022-03-18 LAB — HEMOGLOBIN A1C
Hgb A1c MFr Bld: 7.3 % — ABNORMAL HIGH (ref 4.8–5.6)
Mean Plasma Glucose: 163 mg/dL

## 2022-03-18 MED ORDER — SODIUM CHLORIDE 0.9 % IV SOLN: INTRAVENOUS | Status: DC

## 2022-03-18 NOTE — Consult Note (Addendum)
Cardiology Consultation:   Patient ID: Robert Osborne MRN: [id number]; DOB: 01/31/1946  Admit date: 03/16/2022 Date of Consult: 03/18/2022  PCP:  System, Provider Not In   [facility] Providers Cardiologist:  Rollene Rotunda, MD   {     Patient Profile:   Robert Osborne is a 76 y.o. male with a hx of CAD with recent CABG 08/2021 who is being seen 03/18/2022 for the evaluation of elevated troponin at the request of Dr Jacqulyn Bath.  History of Present Illness:   Robert Osborne 76 yo male history of CAD with prior CABG 08/2021 (LIMA-Diag, SVG-LAD, SVG-PDA,SVG-OM), HTN, HL, DM2, presents with muscle aches and fatigue. Admitted with severe sepsis secondary to pneumonia.  He denies any chest pains at home prior to admission.    ER vitals:  BNP 620 WBC 29.6 Hgb 12.8 Plt 121 K 4.2 Cr 2.42 BUN 43 bicarb 18 AST 116 Lactici acid 4.7-->5 Procalc >150 Trop 403-->484-->481-->572 COVID neg Flu neg CXR density posterior right heart EKG SR, no ischemic changes  08/2021 echo: LVEF 50-55%, apical akinesis, mod RV dysfunction,  08/2021 cath: mid LAD 90% and distal 100%, LCX ostial 75%, OM1 90%, OM2 80%, RCA mid 25%, RPAV 90%  Past Medical History:  Diagnosis Date   Bladder cancer ([facility]) 08/18/2013   Cancer ([facility])    papillary urethral carcinoma,low grade, non-invasive   Diabetes mellitus without complication ([facility])    Erectile dysfunction 10/24/2016   Hyperlipidemia associated with type 2 diabetes mellitus ([facility]) 05/17/2013   Hypertension associated with diabetes ([facility])    Hypokalemia 11/27/2017   Hypomagnesemia 11/27/2017   Major frontotemporal neurocognitive disorder, probable, with behavioral disturbance 11/27/2017   Vitamin D deficiency 05/17/2013    Past Surgical History:  Procedure Laterality Date   COLONOSCOPY W/ POLYPECTOMY     CORONARY ARTERY BYPASS GRAFT N/A 08/22/2021   Procedure: CORONARY ARTERY BYPASS GRAFTING (CABG), ON PUMP, TIMES FOUR , USING LEFT INTERNAL MAMMARY ARTERY AND ENDOSCOPICALLY  HARVESTED RIGHT GREATER SAPHENOUS VEIN;  Surgeon: Corliss Skains, MD;  Location: [facility] OR;  Service: Open Heart Surgery;  Laterality: N/A;   CORONARY BALLOON ANGIOPLASTY N/A 08/16/2021   Procedure: CORONARY BALLOON ANGIOPLASTY;  Surgeon: Corky Crafts, MD;  [facility]: [facility] INVASIVE CV LAB;  Service: Cardiovascular;  Laterality: N/A;   ENDOVEIN HARVEST OF GREATER SAPHENOUS VEIN  08/22/2021   Procedure: ENDOVEIN HARVEST OF GREATER SAPHENOUS VEIN;  Surgeon: Corliss Skains, MD;  Location: [facility] OR;  Service: Open Heart Surgery;;   LEFT HEART CATH AND CORONARY ANGIOGRAPHY N/A 08/16/2021   Procedure: LEFT HEART CATH AND CORONARY ANGIOGRAPHY;  Surgeon: Corky Crafts, MD;  Location: [facility] INVASIVE CV LAB;  Service: Cardiovascular;  Laterality: N/A;   TEE WITHOUT CARDIOVERSION N/A 08/22/2021   Procedure: TRANSESOPHAGEAL ECHOCARDIOGRAM (TEE);  Surgeon: Corliss Skains, MD;  Location: [facility] OR;  Service: Open Heart Surgery;  Laterality: N/A;       Inpatient Medications: Scheduled Meds:  (feeding supplement) PROSource Plus  30 mL Oral BID BM   aspirin EC  81 mg Oral Daily   clopidogrel  75 mg Oral Daily   ezetimibe  10 mg Oral Daily   feeding supplement  1 Container Oral TID BM   guaiFENesin  600 mg Oral BID   heparin  5,000 Units Subcutaneous Q8H   insulin aspart  0-15 Units Subcutaneous TID WC   insulin aspart  0-5 Units Subcutaneous QHS   isosorbide mononitrate  60 mg Oral Daily   metoprolol tartrate  50 mg Oral  BID   multivitamin with minerals  1 tablet Oral Daily   OLANZapine  5 mg Oral QHS   rosuvastatin  40 mg Oral Daily   tamsulosin  0.4 mg Oral Daily   Continuous Infusions:  azithromycin 500 mg (03/18/22 0059)   cefTRIAXone (ROCEPHIN)  IV 2 g (03/18/22 0017)   PRN Meds: acetaminophen **OR** acetaminophen, albuterol, ondansetron **OR** ondansetron (ZOFRAN) IV, oxyCODONE, polyethylene glycol  Allergies:    Allergies  Allergen Reactions   Enalapril Cough    Social  History:   Social History   Socioeconomic History   Marital status: Married    Spouse name: Not on file   Number of children: 3   Years of education: Not on file   Highest education level: Not on file  Occupational History   Occupation: landscaping    Comment: retired, works part time  Tobacco Use   Smoking status: Former    Types: Cigarettes    Quit date: 05/17/1973    Years since quitting: 48.8   Smokeless tobacco: Never  Vaping Use   Vaping Use: Never used  Substance and Sexual Activity   Alcohol use: No   Drug use: No   Sexual activity: Not on file  Other Topics Concern   Not on file  Social History Narrative   Still works as Administrator.      Social Determinants of Health   Financial Resource Strain: Not on file  Food Insecurity: Not on file  Transportation Needs: Not on file  Physical Activity: Not on file  Stress: Not on file  Social Connections: Not on file  Intimate Partner Violence: Not on file    Family History:    Family History  Problem Relation Age of Onset   Heart attack Maternal Grandmother    Stroke Maternal Grandfather    Heart attack Mother 7     ROS:  Please see the history of present illness.   All other ROS reviewed and negative.     Physical Exam/Data:   Vitals:   03/17/22 0806 03/17/22 1207 03/17/22 1946 03/18/22 0426  BP: (!) 146/86 124/70 111/77 (!) 144/82  Pulse: 96 93 69 83  Resp: 18 16 18 16   Temp: 97.9 F (36.6 C) 98.4 F (36.9 C) 98.4 F (36.9 C) 98.9 F (37.2 C)  TempSrc:   Oral Oral  SpO2: 96% 95% 97% 95%  Weight:      Height:        Intake/Output Summary (Last 24 hours) at 03/18/2022 0833 Last data filed at 03/18/2022 0429 Gross per 24 hour  Intake 1140 ml  Output 1700 ml  Net -560 ml      03/16/2022    9:30 PM 02/10/2022    4:07 PM 01/27/2022    2:23 PM  Last 3 Weights  Weight (lbs) 155 lb 150 lb 153 lb 10.6 oz  Weight (kg) 70.308 kg 68.04 kg 69.7 kg     Body mass index is 24.28 kg/m.  General:  Well  nourished, well developed, in no acute distress HEENT: normal Neck: no JVD Vascular: No carotid bruits; Distal pulses 2+ bilaterally Cardiac:  normal S1, S2; RRR; no murmur  Lungs:  coarse breath sounds bilaterla bases Abd: soft, nontender, no hepatomegaly  Ext: no edema Musculoskeletal:  No deformities, BUE and BLE strength normal and equal Skin: warm and dry  Neuro:  CNs 2-12 intact, no focal abnormalities noted Psych:  Normal affect     Relevant CV Studies:   Laboratory Data:  High Sensitivity Troponin:   Recent Labs  Lab 03/16/22 2221 03/17/22 0416 03/17/22 0643 03/17/22 1510  TROPONINIHS 403* 484* 481* 572*     Chemistry Recent Labs  Lab 03/16/22 2221 03/17/22 0417 03/18/22 0534  NA 141  --  141  K 4.2  --  3.9  CL 107  --  113*  CO2 18*  --  19*  GLUCOSE 112*  --  95  BUN 43*  --  44*  CREATININE 2.42*  --  2.13*  CALCIUM 8.6*  --  8.2*  MG  --  1.9  --   GFRNONAA 27*  --  31*  ANIONGAP 16*  --  9    Recent Labs  Lab 03/16/22 2221  PROT 7.1  ALBUMIN 3.3*  AST 116*  ALT 41  ALKPHOS 78  BILITOT 1.0   Lipids No results for input(s): CHOL, TRIG, HDL, LABVLDL, LDLCALC, CHOLHDL in the last 168 hours.  Hematology Recent Labs  Lab 03/16/22 2221 03/17/22 0417 03/18/22 0534  WBC 29.6* 28.3* 21.2*  RBC 5.19 4.76 4.87  HGB 12.8* 11.8* 12.3*  HCT 42.2 38.3* 38.7*  MCV 81.3 80.5 79.5*  MCH 24.7* 24.8* 25.3*  MCHC 30.3 30.8 31.8  RDW 17.4* 17.3* 17.7*  PLT 121* 121* 113*   Thyroid No results for input(s): TSH, FREET4 in the last 168 hours.  BNP Recent Labs  Lab 03/16/22 2221  BNP 620.0*    DDimer No results for input(s): DDIMER in the last 168 hours.   Radiology/Studies:  DG CHEST PORT 1 VIEW  Result Date: 03/17/2022 CLINICAL DATA:  Sepsis EXAM: PORTABLE CHEST 1 VIEW COMPARISON:  03/16/2022 FINDINGS: Sternotomy wires overlie normal cardiac silhouette. Linear atelectasis in the LEFT lower lobe. Density posterior the RIGHT heart. No pulmonary  edema no pneumothorax. IMPRESSION: Density posterior the RIGHT heart. Cannot exclude pneumonia, atelectasis, or nodule. Electronically Signed   By: Genevive Bi M.D.   On: 03/17/2022 08:14   DG Chest Portable 1 View  Result Date: 03/16/2022 CLINICAL DATA:  Shortness of breath EXAM: PORTABLE CHEST 1 VIEW COMPARISON:  10/21/2021 FINDINGS: Prior CABG. Heart is normal size. No confluent opacities or effusions. Linear scarring at the left lung base. No acute bony abnormality. IMPRESSION: No active disease. Electronically Signed   By: Charlett Nose M.D.   On: 03/16/2022 22:01     Assessment and Plan:   1.History of CAD/Prior CABG/Elevated troponin - CABG just 7 months ago.  -elevated troponin in setting of severe sepsis, lactic acidosis, AKI, hypotension.  - EKG without ischemic changes, echo pending. Denies any chest pains or SOB - at this time suspect demand ischemia in setting of severe sepsis.Can f/u echo, no plans for ischemic testing at this time. Continue home cardiac regimen, on DAPT for prior NSTEMI managed with CABG.   2.Severe sepsis - WBC 29, lactic acid 5, Pro calc >150. Organ dysfunction with AKI, elevated LFTs  3.AKI on CKD - Cr 2.4 on admission in setting of severe sepsis, baseline 1.4-1.5  We will f/u echo. If benign we will sign off inpatient care.    Addendum: no significant change on echo, we will sign off inpatient care  For questions or updates, please contact [facility] Please consult [email or web address] for contact info under    Signed, Dina Rich, MD  03/18/2022 8:33 AM

## 2022-03-18 NOTE — Progress Notes (Signed)
*  PRELIMINARY RESULTS* ?Echocardiogram ?2D Echocardiogram has been performed. ? ?Robert Osborne ?03/18/2022, 2:37 PM ?

## 2022-03-18 NOTE — Progress Notes (Signed)
Patient sitting up in the chair, tolerated his breakfast well. Call bell within reach.  ?

## 2022-03-18 NOTE — Progress Notes (Signed)
?PROGRESS NOTE ? ? ? ?Robert Osborne  YIR:[id number] DOB: 12-28-1945 DOA: 03/16/2022 ?PCP: System, Provider Not In ? ? ?Brief Narrative:  ?HPI: Robert Osborne is a 76 y.o. male with medical history significant of with history of bladder cancer, diabetes mellitus type 2, hyperlipidemia, hypertension, behavioral disturbance, and more presents to ED with a chief complaint of myalgias and dyspnea.  Patient reports myalgias of been going on since September.  He does not describe them as a burning pain but an aching pain.  He got worse just hours prior to presentation.  Patient reports that his dyspnea started night before presentation.  He had associated wheezing and coughing productive of white sputum.  His dyspnea is exertional.  He had no associated chest pain, palpitations, dizziness/syncope.  Patient reports he has an inhaler and he used it 3 times during the day, and it only offered temporary relief each time.  Patient reports prior to his dyspnea starting he had an episode of nausea and vomiting with choking.  His last normal meal was breakfast on the day of presentation.  Last normal bowel movement was in the morning of the day of presentation.  Patient reports he does have a left lower quadrant pain.  It started last night after the nausea and vomiting.  It is intermittent.  It is not present now.  Patient also reports that he had dysuria a couple of weeks ago, he presented to his PCP who gave him some medication (presumably an antibiotic), and his pain went away.  Patient reports headaches today.  They are not both of his temple and go across the front of his head.  He has had no change in vision, no change in hearing.  Tylenol does help the pain.  Patient has no other complaints at this time. ?  ?Patient does not smoke, does not drink alcohol, does not use illicit drugs.  He is vaccinated for COVID.  Patient is full code. ? ?Assessment & Plan: ?  ?Principal Problem: ?  Sepsis ([place]) ?Active Problems: ?   Hyperlipidemia associated with type 2 diabetes mellitus ([place]) ?  Hypertension associated with diabetes ([place]) ?  Hypoalbuminemia due to protein-calorie malnutrition ([place]) ?  AKI (acute kidney injury) ([place]) ?  Uncontrolled type 2 diabetes mellitus with hyperglycemia ([place]) ?  CAP (community acquired pneumonia) ?  Elevated troponin ? ?Severe sepsis secondary to community-acquired pneumonia, POA: Patient meets criteria for severe sepsis due toTachycardia, tachypnea, leukocytosis 29.6 and lactic acidosis of 4.7 upon presentation.  Although no infiltrates are seen on the initial chest x-ray but patient does have a history of possible aspiration and lungs have coarse breath sounds with rhonchi bilaterally.  Repeat chest x-ray cannot exclude pneumonia.  Procalcitonin significantly elevated >150 which improved to 89 today.  Sepsis could also be secondary to UTI.  We will continue Rocephin and Zithromax.  Lactic acidosis resolved.  Culture remain negative so far.  Seen by SLP and he was not found to have any aspiration risk.  Appears very weak, lives with his wife.  We will consult PT OT. ? ?Elevated troponin with prior history of CAD s/p CABG 7 months ago: Patient has significantly elevated troponin 403> 484> 481> 572.  Although significantly elevated but flat and patient not having any chest pain or shortness of breath suggest possible demand ischemia in the setting of sepsis.  Cardiology also agrees with this.  Echo pending to rule out wall motion abnormality.  Continue DAPT. ? ?Uncontrolled type 2 diabetes mellitus  with hyperglycemia ([place]) ?-5 units of basal insulin at home and 2 units of mealtime insulin at home ?-Holding basal insulin, continue sliding scale coverage.  Blood sugar labile but fairly controlled.  Hemoglobin A1c 7.3. ? ?AKI on CKD stage IIIa: Patient's baseline creatinine appears to be anywhere from 1.25-1.44 with a GFR around 49.  Presented with creatinine of 2.42 with some improvement to 2.13 today.   Likely prerenal secondary to sepsis.  I will resume gentle hydration.  Avoid nephrotoxic agents including Farxiga. ?  ?Hypoalbuminemia: Seen by nutrition, per them, patient does not meet criteria for malnutrition however he remains at high risk due to poor appetite. ? ?Hypertension: Blood pressure very well controlled.  Continue Imdur and resume Lopressor.  ? ?Hyperlipidemia associated with type 2 diabetes mellitus ([place]) ?- Continue statin medication ? ?DVT prophylaxis: heparin injection 5,000 Units Start: 03/17/22 0600 ?SCDs Start: 03/17/22 0136 ?  Code Status: Full Code  ?Family Communication:  None present at bedside.  Plan of care discussed with patient in length and he/she verbalized understanding and agreed with it. ? ?Status is: Inpatient ?Remains inpatient appropriate because: Patient is very sick ? ? ?Estimated body mass index is 24.28 kg/m? as calculated from the following: ?  Height as of this encounter: '5\' 7"'$  (1.702 m). ?  Weight as of this encounter: 70.3 kg. ? ?  ?Nutritional Assessment: ?Body mass index is 24.28 kg/m?Marland KitchenMarland Kitchen ?Seen by dietician.  I agree with the assessment and plan as outlined below: ?Nutrition Status: ?Nutrition Problem: Inadequate oral intake ?Etiology: poor appetite, vomiting ?Signs/Symptoms: meal completion < 50% ?Interventions: Boost Breeze, MVI, Prostat ? ?. ?Skin Assessment: ?I have examined the patient's skin and I agree with the wound assessment as performed by the wound care RN as outlined below: ?  ? ?Consultants:  ?None ? ?Procedures:  ?None ? ?Antimicrobials:  ?Anti-infectives (From admission, onward)  ? ? Start     Dose/Rate Route Frequency Ordered Stop  ? 03/18/22 0000  cefTRIAXone (ROCEPHIN) 2 g in sodium chloride 0.9 % 100 mL IVPB       ? 2 g ?200 mL/hr over 30 Minutes Intravenous Every 24 hours 03/17/22 0135 03/22/22 2359  ? 03/18/22 0000  azithromycin (ZITHROMAX) 500 mg in sodium chloride 0.9 % 250 mL IVPB       ? 500 mg ?250 mL/hr over 60 Minutes Intravenous Every 24  hours 03/17/22 0135 03/22/22 2359  ? 03/16/22 2330  cefTRIAXone (ROCEPHIN) 1 g in sodium chloride 0.9 % 100 mL IVPB       ? 1 g ?200 mL/hr over 30 Minutes Intravenous  Once 03/16/22 2326 03/17/22 0035  ? 03/16/22 2330  azithromycin (ZITHROMAX) 500 mg in sodium chloride 0.9 % 250 mL IVPB       ? 500 mg ?250 mL/hr over 60 Minutes Intravenous  Once 03/16/22 2326 03/17/22 0110  ? ?  ?  ? ? ?Subjective: ?Seen and examined.  He tells me that he feels better, breathing has improved.  No other complaint. ? ?Objective: ?Vitals:  ? 03/17/22 0806 03/17/22 1207 03/17/22 1946 03/18/22 0426  ?BP: (!) 146/86 124/70 111/77 (!) 144/82  ?Pulse: 96 93 69 83  ?Resp: '18 16 18 16  '$ ?Temp: 97.9 ?F (36.6 ?C) 98.4 ?F (36.9 ?C) 98.4 ?F (36.9 ?C) 98.9 ?F (37.2 ?C)  ?TempSrc:   Oral Oral  ?SpO2: 96% 95% 97% 95%  ?Weight:      ?Height:      ? ? ?Intake/Output Summary (Last 24 hours) at 03/18/2022  1030 ?Last data filed at 03/18/2022 0900 ?Gross per 24 hour  ?Intake 1380 ml  ?Output 1700 ml  ?Net -320 ml  ? ? ?Filed Weights  ? 03/16/22 2130  ?Weight: 70.3 kg  ? ? ?Examination: ? ?General exam: Appears calm and comfortable but appears very weak ?Respiratory system: Bilateral rhonchi, improved compared to yesterday. Respiratory effort normal. ?Cardiovascular system: S1 & S2 heard, RRR. No JVD, murmurs, rubs, gallops or clicks. No pedal edema. ?Gastrointestinal system: Abdomen is nondistended, soft and nontender. No organomegaly or masses felt. Normal bowel sounds heard. ?Central nervous system: Alert and oriented. No focal neurological deficits. ?Extremities: Symmetric 5 x 5 power. ?Skin: No rashes, lesions or ulcers.  ?Psychiatry: Judgement and insight appear normal. Mood & affect appropriate.  ? ? ?Data Reviewed: I have personally reviewed following labs and imaging studies ? ?CBC: ?Recent Labs  ?Lab 03/16/22 ?2221 03/17/22 ?4360 03/18/22 ?6770  ?WBC 29.6* 28.3* 21.2*  ?NEUTROABS  --  14.2* 18.0*  ?HGB 12.8* 11.8* 12.3*  ?HCT 42.2 38.3* 38.7*  ?MCV  81.3 80.5 79.5*  ?PLT 121* 121* 113*  ? ? ?Basic Metabolic Panel: ?Recent Labs  ?Lab 03/16/22 ?2221 03/17/22 ?3403 03/18/22 ?5248  ?NA 141  --  141  ?K 4.2  --  3.9  ?CL 107  --  113*  ?CO2 18*  --  19*  ?GLUCOSE 112

## 2022-03-19 DIAGNOSIS — R058 Other specified cough: Secondary | ICD-10-CM

## 2022-03-19 DIAGNOSIS — D72829 Elevated white blood cell count, unspecified: Secondary | ICD-10-CM

## 2022-03-19 DIAGNOSIS — R0602 Shortness of breath: Secondary | ICD-10-CM | POA: Diagnosis not present

## 2022-03-19 DIAGNOSIS — D72828 Other elevated white blood cell count: Secondary | ICD-10-CM

## 2022-03-19 DIAGNOSIS — A419 Sepsis, unspecified organism: Secondary | ICD-10-CM | POA: Diagnosis not present

## 2022-03-19 DIAGNOSIS — N179 Acute kidney failure, unspecified: Secondary | ICD-10-CM | POA: Diagnosis not present

## 2022-03-19 DIAGNOSIS — D696 Thrombocytopenia, unspecified: Secondary | ICD-10-CM

## 2022-03-19 DIAGNOSIS — R6520 Severe sepsis without septic shock: Secondary | ICD-10-CM | POA: Diagnosis not present

## 2022-03-19 LAB — GLUCOSE, CAPILLARY
Glucose-Capillary: 118 mg/dL — ABNORMAL HIGH (ref 70–99)
Glucose-Capillary: 178 mg/dL — ABNORMAL HIGH (ref 70–99)
Glucose-Capillary: 198 mg/dL — ABNORMAL HIGH (ref 70–99)
Glucose-Capillary: 159 mg/dL — ABNORMAL HIGH (ref 70–99)

## 2022-03-19 LAB — BASIC METABOLIC PANEL
Anion gap: 7 (ref 5–15)
BUN: 45 mg/dL — ABNORMAL HIGH (ref 8–23)
CO2: 20 mmol/L — ABNORMAL LOW (ref 22–32)
Calcium: 8.1 mg/dL — ABNORMAL LOW (ref 8.9–10.3)
Chloride: 113 mmol/L — ABNORMAL HIGH (ref 98–111)
Creatinine, Ser: 2.17 mg/dL — ABNORMAL HIGH (ref 0.61–1.24)
GFR, Estimated: 30 mL/min — ABNORMAL LOW (ref >60)
Glucose, Bld: 139 mg/dL — ABNORMAL HIGH (ref 70–99)
Potassium: 3.6 mmol/L (ref 3.5–5.1)
Sodium: 140 mmol/L (ref 135–145)

## 2022-03-19 LAB — CBC WITH DIFFERENTIAL/PLATELET
Abs Immature Granulocytes: 0.04 10*3/uL (ref 0.00–0.07)
Basophils Absolute: 0 10*3/uL (ref 0.0–0.1)
Basophils Relative: 0 %
Eosinophils Absolute: 0 10*3/uL (ref 0.0–0.5)
Eosinophils Relative: 0 %
HCT: 36.7 % — ABNORMAL LOW (ref 39.0–52.0)
Hemoglobin: 11.7 g/dL — ABNORMAL LOW (ref 13.0–17.0)
Immature Granulocytes: 0 %
Lymphocytes Relative: 10 %
Lymphs Abs: 1.2 10*3/uL (ref 0.7–4.0)
MCH: 25.3 pg — ABNORMAL LOW (ref 26.0–34.0)
MCHC: 31.9 g/dL (ref 30.0–36.0)
MCV: 79.4 fL — ABNORMAL LOW (ref 80.0–100.0)
Monocytes Absolute: 0.7 10*3/uL (ref 0.1–1.0)
Monocytes Relative: 6 %
Neutro Abs: 10.2 10*3/uL — ABNORMAL HIGH (ref 1.7–7.7)
Neutrophils Relative %: 84 %
Platelets: 122 10*3/uL — ABNORMAL LOW (ref 150–400)
RBC: 4.62 MIL/uL (ref 4.22–5.81)
RDW: 17.5 % — ABNORMAL HIGH (ref 11.5–15.5)
WBC: 12.1 10*3/uL — ABNORMAL HIGH (ref 4.0–10.5)
nRBC: 0 % (ref 0.0–0.2)

## 2022-03-19 LAB — URINE CULTURE: Culture: NO GROWTH

## 2022-03-19 NOTE — Evaluation (Signed)
Physical Therapy Evaluation ?Patient Details ?Name: Robert Osborne ?MRN: [id number] ?DOB: 11-27-1946 ?Today's Date: 03/19/2022 ? ?History of Present Illness ? Robert Osborne is a 76 y.o. male with medical history significant of with history of bladder cancer, diabetes mellitus type 2, hyperlipidemia, hypertension, behavioral disturbance, and more presents to ED with a chief complaint of myalgias and dyspnea.  Patient reports myalgias of been going on since September.  He does not describe them as a burning pain but an aching pain.  He got worse just hours prior to presentation.  Patient reports that his dyspnea started night before presentation.  He had associated wheezing and coughing productive of white sputum.  His dyspnea is exertional.  He had no associated chest pain, palpitations, dizziness/syncope.  Patient reports he has an inhaler and he used it 3 times during the day, and it only offered temporary relief each time.  Patient reports prior to his dyspnea starting he had an episode of nausea and vomiting with choking.  His last normal meal was breakfast on the day of presentation.  Last normal bowel movement was in the morning of the day of presentation.  Patient reports he does have a left lower quadrant pain.  It started last night after the nausea and vomiting.  It is intermittent.  It is not present now.  Patient also reports that he had dysuria a couple of weeks ago, he presented to his PCP who gave him some medication (presumably an antibiotic), and his pain went away.  Patient reports headaches today.  They are not both of his temple and go across the front of his head.  He has had no change in vision, no change in hearing.  Tylenol does help the pain.  Patient has no other complaints at this time. ?  ?Clinical Impression ? Patient presents seated in chair. He is awake, alert and cooperative. Patient noting increased neck pain. Patient able to perform transfers with minimal assistance, though performs  in very slow, labored fashion. Patient able to stand from sitting Mod I with heavy reliance on Ues. Gait is very slow, with small step, flexed trunk posturing. Patient does require verbal cueing for proper use of RW, and hand placement with transfers. Patient able to ambulate short distance from chair to bed and back to chair with no observed distress. Patient left in chair with phone and call bell in reach, nursing present in room. Patient will benefit from continued physical therapy in hospital and recommended venue below to increase strength, balance, endurance for safe ADLs and gait.  ?   ?   ? ?Recommendations for follow up therapy are one component of a multi-disciplinary discharge planning process, led by the attending physician.  Recommendations may be updated based on patient status, additional functional criteria and insurance authorization. ? ?Follow Up Recommendations Other (comment) (Likely HH candidate, unless no home support as stated or wife unable, in that case SNF) ? ?  ?Assistance Recommended at Discharge Intermittent Supervision/Assistance  ?Patient can return home with the following ? A little help with walking and/or transfers;A little help with bathing/dressing/bathroom;Help with stairs or ramp for entrance ? ?  ?Equipment Recommendations None recommended by PT  ?Recommendations for Other Services ?    ?  ?Functional Status Assessment Patient has had a recent decline in their functional status and demonstrates the ability to make significant improvements in function in a reasonable and predictable amount of time.  ? ?  ?Precautions / Restrictions Precautions ?Precautions: Fall ?Restrictions ?Weight  Bearing Restrictions: No  ? ?  ? ?Mobility ? Bed Mobility ?Overal bed mobility: Modified Independent, Needs Assistance ?Bed Mobility: Supine to Sit ?  ?  ?Supine to sit: Min assist ?  ?  ?General bed mobility comments: slow, labored movement ?  ? ?Transfers ?Overall transfer level: Needs  assistance ?  ?Transfers: Sit to/from Stand ?Sit to Stand: Supervision, Modified independent (Device/Increase time) ?  ?  ?  ?  ?  ?General transfer comment: slow labored movement, relies on UEs ?  ? ?Ambulation/Gait ?Ambulation/Gait assistance: Supervision, Modified independent (Device/Increase time) ?Gait Distance (Feet): 5 Feet ?Assistive device: Rolling walker (2 wheels) ?Gait Pattern/deviations: Decreased stride length ?Gait velocity: slow ?  ?  ?General Gait Details: small step, very slow gait, flexed trunk, requires cues for proper distance to RW and cues for turning ? ?Stairs ?  ?  ?  ?  ?  ? ?Wheelchair Mobility ?  ? ?Modified Rankin (Stroke Patients Only) ?  ? ?  ? ?Balance Overall balance assessment: Needs assistance ?Sitting-balance support: Feet supported, Bilateral upper extremity supported ?Sitting balance-Leahy Scale: Poor ?Sitting balance - Comments: seated at EOB ?  ?Standing balance support: Bilateral upper extremity supported, During functional activity ?Standing balance-Leahy Scale: Poor ?Standing balance comment: Requires RW support to stand ?  ?  ?  ?  ?  ?  ?  ?  ?  ?  ?  ?   ? ? ? ?Pertinent Vitals/Pain Pain Assessment ?Pain Assessment: 0-10 ?Pain Score: 9  ?Pain Location: neck ?Pain Descriptors / Indicators: Aching ?Pain Intervention(s): Limited activity within patient's tolerance, Monitored during session  ? ? ?Home Living Family/patient expects to be discharged to:: Private residence ?Living Arrangements: Spouse/significant other ?Available Help at Discharge: Family;Available 24 hours/day ?Type of Home: House ?  ?  ?  ?  ?Home Layout: One level ?Home Equipment: Conservation officer, nature (2 wheels);Cane - single point ?   ?  ?Prior Function Prior Level of Function : Independent/Modified Independent ?  ?  ?  ?  ?  ?  ?Mobility Comments: Reports ambulating with RW at baseline ?  ?  ? ? ?Hand Dominance  ?   ? ?  ?Extremity/Trunk Assessment  ? Upper Extremity Assessment ?Upper Extremity Assessment: Defer  to OT evaluation ?  ? ?Lower Extremity Assessment ?Lower Extremity Assessment: Generalized weakness ?  ? ?   ?Communication  ? Communication: No difficulties  ?Cognition Arousal/Alertness: Awake/alert ?Behavior During Therapy: [facility] for tasks assessed/performed ?Overall Cognitive Status: Within Functional Limits for tasks assessed ?  ?  ?  ?  ?  ?  ?  ?  ?  ?  ?  ?  ?  ?  ?  ?  ?  ?  ?  ? ?  ?General Comments   ? ?  ?Exercises    ? ?Assessment/Plan  ?  ?PT Assessment Patient needs continued PT services  ?PT Problem List Decreased strength;Decreased mobility;Decreased activity tolerance;Decreased balance;Pain ? ?   ?  ?PT Treatment Interventions DME instruction;Therapeutic exercise;Gait training;Balance training;Manual techniques;Therapeutic activities;Patient/family education;Functional mobility training;Neuromuscular re-education   ? ?PT Goals (Current goals can be found in the Care Plan section)  ?Acute Rehab PT Goals ?Patient Stated Goal: Return home ?PT Goal Formulation: With patient ?Time For Goal Achievement: 04/02/22 ?Potential to Achieve Goals: Good ? ?  ?Frequency Min 3X/week ?  ? ? ?Co-evaluation   ?  ?  ?  ?  ? ? ?  ?AM-PAC PT "6 Clicks" Mobility  ?Outcome Measure  Help needed turning from your back to your side while in a flat bed without using bedrails?: A Little ?Help needed moving from lying on your back to sitting on the side of a flat bed without using bedrails?: A Little ?Help needed moving to and from a bed to a chair (including a wheelchair)?: A Little ?Help needed standing up from a chair using your arms (e.g., wheelchair or bedside chair)?: A Little ?Help needed to walk in hospital room?: A Little ?Help needed climbing 3-5 steps with a railing? : A Lot ?6 Click Score: 17 ? ?  ?End of Session Equipment Utilized During Treatment: Gait belt ?Activity Tolerance: Patient limited by fatigue ?Patient left: in chair;with call bell/phone within reach;with nursing/sitter in room ?Nurse Communication:  Mobility status ?PT Visit Diagnosis: Muscle weakness (generalized) (M62.81);Other abnormalities of gait and mobility (R26.89);Unsteadiness on feet (R26.81);Difficulty in walking, not elsewhere classified (R26.2) ?  ?

## 2022-03-19 NOTE — Plan of Care (Signed)
?  Problem: Acute Rehab OT Goals (only OT should resolve) ?Goal: Pt. Will Perform Grooming ?Flowsheets (Taken 03/19/2022 1222) ?Pt Will Perform Grooming: ? standing ? with supervision ? with min guard assist ?Goal: Pt. Will Perform Upper Body Dressing ?Flowsheets (Taken 03/19/2022 1222) ?Pt Will Perform Upper Body Dressing: ? Independently ? sitting ?Goal: Pt. Will Perform Lower Body Dressing ?Flowsheets (Taken 03/19/2022 1222) ?Pt Will Perform Lower Body Dressing: ? Independently ? sitting/lateral leans ?Goal: Pt. Will Transfer To Toilet ?Flowsheets (Taken 03/19/2022 1222) ?Pt Will Transfer to Toilet: ? with modified independence ? stand pivot transfer ?Goal: Pt/Caregiver Will Perform Home Exercise Program ?Flowsheets (Taken 03/19/2022 1222) ?Pt/caregiver will Perform Home Exercise Program: ? Increased ROM ? Increased strength ? Both right and left upper extremity ? Independently ? Kaliopi Blyden OT, MOT ? ?

## 2022-03-19 NOTE — Evaluation (Signed)
Occupational Therapy Evaluation ?Patient Details ?Name: Robert Osborne ?MRN: [id number] ?DOB: 1946-07-10 ?Today's Date: 03/19/2022 ? ? ?History of Present Illness Robert Osborne is a 76 y.o. male with medical history significant of with history of bladder cancer, diabetes mellitus type 2, hyperlipidemia, hypertension, behavioral disturbance, and more presents to ED with a chief complaint of myalgias and dyspnea.  Patient reports myalgias of been going on since September.  He does not describe them as a burning pain but an aching pain.  He got worse just hours prior to presentation.  Patient reports that his dyspnea started night before presentation.  He had associated wheezing and coughing productive of white sputum.  His dyspnea is exertional.  He had no associated chest pain, palpitations, dizziness/syncope.  Patient reports he has an inhaler and he used it 3 times during the day, and it only offered temporary relief each time.  Patient reports prior to his dyspnea starting he had an episode of nausea and vomiting with choking.  His last normal meal was breakfast on the day of presentation.  Last normal bowel movement was in the morning of the day of presentation.  Patient reports he does have a left lower quadrant pain.  It started last night after the nausea and vomiting.  It is intermittent.  It is not present now.  Patient also reports that he had dysuria a couple of weeks ago, he presented to his PCP who gave him some medication (presumably an antibiotic), and his pain went away.  Patient reports headaches today.  They are not both of his temple and go across the front of his head.  He has had no change in vision, no change in hearing.  Tylenol does help the pain.  Patient has no other complaints at this time.  ? ?Clinical Impression ?  ?Pt agreeable to OT evaluation. Pt presents weak with poor to fair sitting balance at EOB and need for RW and min A for functional step pivot transfer to chair with very slow  labored movement. Pt removed from supplemental O2 during session with SpO2 staying around 97% or higher. Pt reported 24/7 assist but wife not present to provide information on level of support possible. SNF recommended if wife is unable to give support needed. If wife can support than [facility] OT is appropriate. Pt will benefit from continued OT in the hospital and recommended venue below to increase strength, balance, and endurance for safe ADL's.  ? ? ?   ? ?Recommendations for follow up therapy are one component of a multi-disciplinary discharge planning process, led by the attending physician.  Recommendations may be updated based on patient status, additional functional criteria and insurance authorization.  ? ?Follow Up Recommendations ? Other (comment) (SNF if needed support is not available at home.)  ?  ?Assistance Recommended at Discharge Intermittent Supervision/Assistance  ?Patient can return home with the following A little help with walking and/or transfers;A little help with bathing/dressing/bathroom;Assistance with cooking/housework;Assist for transportation;Help with stairs or ramp for entrance ? ?  ?Functional Status Assessment ? Patient has had a recent decline in their functional status and demonstrates the ability to make significant improvements in function in a reasonable and predictable amount of time.  ?Equipment Recommendations ?    ?  ?Recommendations for Other Services   ? ? ?  ?Precautions / Restrictions Precautions ?Precautions: Fall ?Restrictions ?Weight Bearing Restrictions: No  ? ?  ? ?Mobility Bed Mobility ?Overal bed mobility: Needs Assistance ?Bed Mobility: Supine to Sit ?  ?  ?  Supine to sit: Min guard ?  ?  ?General bed mobility comments: Very slow labored movement and extended time ?  ? ?Transfers ?Overall transfer level: Needs assistance ?Equipment used: Rolling walker (2 wheels) ?Transfers: Sit to/from Stand, Bed to chair/wheelchair/BSC ?Sit to Stand: Min assist ?  ?  ?Step  pivot transfers: Min assist ?  ?  ?General transfer comment: slow labored movement, relies on UEs ?  ? ?  ?Balance Overall balance assessment: Needs assistance ?Sitting-balance support: Feet supported, No upper extremity supported ?Sitting balance-Leahy Scale: Poor ?Sitting balance - Comments: poor to fair seated EOB ?  ?Standing balance support: Bilateral upper extremity supported, During functional activity ?Standing balance-Leahy Scale: Poor ?Standing balance comment: Requires RW support to stand ?  ?  ?  ?  ?  ?  ?  ?  ?  ?  ?  ?   ? ?ADL either performed or assessed with clinical judgement  ? ?ADL Overall ADL's : Needs assistance/impaired ?Eating/Feeding: Set up;Sitting ?  ?Grooming: Sitting;Modified independent;Set up ?  ?  ?  ?Lower Body Bathing: Modified independent;Sitting/lateral leans ?  ?Upper Body Dressing : Sitting;Set up;Modified independent ?  ?Lower Body Dressing: Modified independent;Supervision/safety;Sitting/lateral leans ?Lower Body Dressing Details (indicate cue type and reason): Pt donning and doffing socks seated in chair with extended time. ?Toilet Transfer: Minimal assistance;Stand-pivot;Rolling walker (2 wheels) ?Toilet Transfer Details (indicate cue type and reason): EOB to chair transfer ?  ?  ?  ?  ?Functional mobility during ADLs: Minimal assistance;Rolling walker (2 wheels) ?General ADL Comments: Very slow labored movement for mobility.  ? ? ? ?Vision Baseline Vision/History:  ("eyes get filmed up" "eyes water" "get off balance") ?Ability to See in Adequate Light: 1 Impaired ?Patient Visual Report: No change from baseline ?Vision Assessment?: No apparent visual deficits (Other than baseline reports.)  ?   ?Perception   ?  ?Praxis   ?  ? ?Pertinent Vitals/Pain Pain Assessment ?Pain Assessment: 0-10 ?Pain Score: 9  ?Pain Location: neck ?Pain Descriptors / Indicators: Aching ?Pain Intervention(s): Limited activity within patient's tolerance, Monitored during session, Repositioned   ? ? ? ?Hand Dominance Right ?  ?Extremity/Trunk Assessment Upper Extremity Assessment ?Upper Extremity Assessment: RUE deficits/detail;LUE deficits/detail ?RUE Deficits / Details: 3-/5 MMT shoulder flexion. WFL P/ROM of shoulder flexion and abduction. Generally weak otherwise. ?LUE Deficits / Details: ~50% available A/ROM for shoulder flexion. P/ROM to about 75% of availble range. Generaly weak otherwise. ?  ?Lower Extremity Assessment ?Lower Extremity Assessment: Defer to PT evaluation ?  ?Cervical / Trunk Assessment ?Cervical / Trunk Assessment: Kyphotic ?  ?Communication Communication ?Communication: No difficulties ?  ?Cognition Arousal/Alertness: Awake/alert ?Behavior During Therapy: [facility] for tasks assessed/performed ?Overall Cognitive Status: Within Functional Limits for tasks assessed ?  ?  ?  ?  ?  ?  ?  ?  ?  ?  ?  ?  ?  ?  ?  ?  ?  ?  ?  ?   ? ?  ?   ?  ?    ? ? ?Home Living Family/patient expects to be discharged to:: Private residence ?Living Arrangements: Spouse/significant other ?Available Help at Discharge: Family;Available 24 hours/day ?Type of Home: House ?Home Access: Stairs to enter ?Entrance Stairs-Number of Steps: 2 ?Entrance Stairs-Rails: None ?Home Layout: One level ?  ?  ?Bathroom Shower/Tub: Walk-in shower ?  ?Bathroom Toilet: Standard ?Bathroom Accessibility: Yes ?How Accessible: Accessible via walker ?Home Equipment: Conservation officer, nature (2 wheels);Cane - single point;BSC/3in1;Rollator (4 wheels) ?  ?  ?  ? ?  ?  Prior Functioning/Environment Prior Level of Function : Needs assist ?  ?  ?  ?  ?  ?  ?Mobility Comments: Reports ambulating with RW at baseline. PRN assist from wife. ?ADLs Comments: Independent ADL's; assisted for IADL's by wife. ?  ? ?  ?  ?OT Problem List: Decreased strength;Decreased range of motion;Decreased activity tolerance;Impaired balance (sitting and/or standing) ?  ?   ?OT Treatment/Interventions: Self-care/ADL training;Therapeutic exercise;Therapeutic  activities;Patient/family education;Balance training  ?  ?OT Goals(Current goals can be found in the care plan section) Acute Rehab OT Goals ?Patient Stated Goal: return home ?OT Goal Formulation: With patient ?Time For Goal Achie

## 2022-03-19 NOTE — Progress Notes (Signed)
?PROGRESS NOTE ? ? ? ?Robert Osborne  HUD:[id number] DOB: [birth year] DOA: 03/16/2022 ?PCP: System, Provider Not In ? ? ?Brief Narrative:  ?Robert Osborne is a 76 y.o. male with medical history significant of with history of bladder cancer, diabetes mellitus type 2, hyperlipidemia, hypertension, behavioral disturbance, and more presents to ED with a chief complaint of myalgias and dyspnea.  ? ?Assessment & Plan: ?  ?Principal Problem: ?  Sepsis ([place]) ?Active Problems: ?  Hyperlipidemia associated with type 2 diabetes mellitus ([place]) ?  Hypertension associated with diabetes ([place]) ?  Hypoalbuminemia due to protein-calorie malnutrition ([place]) ?  AKI (acute kidney injury) ([place]) ?  Uncontrolled type 2 diabetes mellitus with hyperglycemia ([place]) ?  CAP (community acquired pneumonia) ?  Elevated troponin ? ? ?Severe sepsis secondary to community acquired versus aspiration pneumonia, POA  ?Right lower lobe infiltrates noted at the mediastinum compared to previous ?Continue ceftriaxone, azithromycin ?Procalcitonin downtrending appropriately ? ?Acute ambulatory dysfunction ?PT OT following - recommending SNF versus home health depending on level of home care from wife ? ?Elevated troponin  ?Patient remains asymptomatic ?Troponin elevation likely supply demand mismatch secondary to above severe sepsis ?Known history of CAD s/p CABG 7 months ago ?Echo limited, 55% EF with mild grade 1 diastolic dysfunction ?Continue DAPT. ?  ?Uncontrolled diabetes mellitus type 2 with hyperglycemia  ?5 units of basal insulin at home and 2 units of mealtime insulin at home ?-Holding basal insulin, continue sliding scale coverage.  Blood sugar labile but fairly controlled.  Hemoglobin A1c 7.3. ?  ?AKI on CKD stage IIIa:  ?Likely prerenal secondary to sepsis. ?Continue to increase p.o. intake as appropriate ?  ?Hypoalbuminemia:  ?High risk for malnutrition but not meeting criteria at this time ?  ?Essential hypertension  ?Continue Imdur and resume  Lopressor.  ?  ?Hyperlipidemia associated with type 2 diabetes mellitus ([place]) ?- Continue statin medication ?  ?DVT prophylaxis: heparin ?  Code Status: Full Code  ?Family Communication:  None present ? ?Status is: Inpatient ? ?Dispo: The patient is from: Home ?             Anticipated d/c is to: To be determined ?             Anticipated d/c date is: 24 to 48 hours ?             Patient currently not medically stable for discharge ? ?Consultants:  ?None ? ?Procedures:  ?None ? ?Antimicrobials:  ?Azithromycin, ceftriaxone ? ?Subjective: ?No acute issues or events overnight denies nausea vomiting diarrhea constipation headache fevers chills or chest pain ? ?Objective: ?Vitals:  ? 03/18/22 0426 03/18/22 1206 03/18/22 2014 03/19/22 8588  ?BP: (!) 144/82 117/69 121/70 128/73  ?Pulse: 83 74 73 75  ?Resp: '16 16 20 17  '$ ?Temp: 98.9 ?F (37.2 ?C) 98.6 ?F (37 ?C) 98.8 ?F (37.1 ?C) 98.3 ?F (36.8 ?C)  ?TempSrc: Oral  Oral Oral  ?SpO2: 95% 98% 100% 100%  ?Weight:      ?Height:      ? ? ?Intake/Output Summary (Last 24 hours) at 03/19/2022 0653 ?Last data filed at 03/19/2022 0600 ?Gross per 24 hour  ?Intake 2966.04 ml  ?Output 2250 ml  ?Net 716.04 ml  ? ?Filed Weights  ? 03/16/22 2130  ?Weight: 70.3 kg  ? ? ?Examination: ? ?General exam: Appears calm and comfortable  ?Respiratory system: Clear to auscultation. Respiratory effort normal. ?Cardiovascular system: S1 & S2 heard, RRR. No JVD, murmurs, rubs, gallops or clicks. No pedal  edema. ?Gastrointestinal system: Abdomen is nondistended, soft and nontender. No organomegaly or masses felt. Normal bowel sounds heard. ?Central nervous system: Alert and oriented. No focal neurological deficits. ?Extremities: Symmetric 5 x 5 power. ?Skin: No rashes, lesions or ulcers ?Psychiatry: Judgement and insight appear normal. Mood & affect appropriate.  ? ? ? ?Data Reviewed: I have personally reviewed following labs and imaging studies ? ?CBC: ?Recent Labs  ?Lab 03/16/22 ?2221 03/17/22 ?9242  03/18/22 ?6834 03/19/22 ?1962  ?WBC 29.6* 28.3* 21.2* 12.1*  ?NEUTROABS  --  24.6* 18.0* 10.2*  ?HGB 12.8* 11.8* 12.3* 11.7*  ?HCT 42.2 38.3* 38.7* 36.7*  ?MCV 81.3 80.5 79.5* 79.4*  ?PLT 121* 121* 113* 122*  ? ?Basic Metabolic Panel: ?Recent Labs  ?Lab 03/16/22 ?2221 03/17/22 ?2297 03/18/22 ?9892 03/19/22 ?1194  ?NA 141  --  141 140  ?K 4.2  --  3.9 3.6  ?CL 107  --  113* 113*  ?CO2 18*  --  19* 20*  ?GLUCOSE 112*  --  95 139*  ?BUN 43*  --  44* 45*  ?CREATININE 2.42*  --  2.13* 2.17*  ?CALCIUM 8.6*  --  8.2* 8.1*  ?MG  --  1.9  --   --   ? ?GFR: ?Estimated Creatinine Clearance: 26.2 mL/min (A) (by C-G formula based on SCr of 2.17 mg/dL (H)). ?Liver Function Tests: ?Recent Labs  ?Lab 03/16/22 ?2221  ?AST 116*  ?ALT 41  ?ALKPHOS 78  ?BILITOT 1.0  ?PROT 7.1  ?ALBUMIN 3.3*  ? ?No results for input(s): LIPASE, AMYLASE in the last 168 hours. ?No results for input(s): AMMONIA in the last 168 hours. ?Coagulation Profile: ?Recent Labs  ?Lab 03/17/22 ?1740  ?INR 1.7*  ? ?Cardiac Enzymes: ?No results for input(s): CKTOTAL, CKMB, CKMBINDEX, TROPONINI in the last 168 hours. ?BNP (last 3 results) ?No results for input(s): PROBNP in the last 8760 hours. ?HbA1C: ?Recent Labs  ?  03/17/22 ?0416  ?HGBA1C 7.3*  ? ?CBG: ?Recent Labs  ?Lab 03/17/22 ?1951 03/18/22 ?0730 03/18/22 ?1113 03/18/22 ?1611 03/18/22 ?2156  ?GLUCAP 81 88 263* 161* 249*  ? ?Lipid Profile: ?No results for input(s): CHOL, HDL, LDLCALC, TRIG, CHOLHDL, LDLDIRECT in the last 72 hours. ?Thyroid Function Tests: ?No results for input(s): TSH, T4TOTAL, FREET4, T3FREE, THYROIDAB in the last 72 hours. ?Anemia Panel: ?No results for input(s): VITAMINB12, FOLATE, FERRITIN, TIBC, IRON, RETICCTPCT in the last 72 hours. ?Sepsis Labs: ?Recent Labs  ?Lab 03/17/22 ?0000 03/17/22 ?8144 03/17/22 ?8185 03/17/22 ?1227 03/18/22 ?6314  ?PROCALCITON  --  >150.00  --   --  85.67  ?LATICACIDVEN 5.0* 3.7* 2.4* 1.7  --   ? ? ?Recent Results (from the past 240 hour(s))  ?Resp Panel by RT-PCR  (Flu A&B, Covid) Nasopharyngeal Swab     Status: None  ? Collection Time: 03/16/22 11:19 PM  ? Specimen: Nasopharyngeal Swab; Nasopharyngeal(NP) swabs in vial transport medium  ?Result Value Ref Range Status  ? SARS Coronavirus 2 by RT PCR NEGATIVE NEGATIVE Final  ?  Comment: (NOTE) ?SARS-CoV-2 target nucleic acids are NOT DETECTED. ? ?The SARS-CoV-2 RNA is generally detectable in upper respiratory ?specimens during the acute phase of infection. The lowest ?concentration of SARS-CoV-2 viral copies this assay can detect is ?138 copies/mL. A negative result does not preclude SARS-Cov-2 ?infection and should not be used as the sole basis for treatment or ?other patient management decisions. A negative result may occur with  ?improper specimen collection/handling, submission of specimen other ?than nasopharyngeal swab, presence of viral mutation(s) within the ?  areas targeted by this assay, and inadequate number of viral ?copies(<138 copies/mL). A negative result must be combined with ?clinical observations, patient history, and epidemiological ?information. The expected result is Negative. ? ?Fact Sheet for Patients:  ?EntrepreneurPulse.com.au ? ?Fact Sheet for Healthcare Providers:  ?IncredibleEmployment.be ? ?This test is no t yet approved or cleared by the [place] FDA and  ?has been authorized for detection and/or diagnosis of SARS-CoV-2 by ?FDA under an Emergency Use Authorization (EUA). This EUA will remain  ?in effect (meaning this test can be used) for the duration of the ?COVID-19 declaration under Section 564(b)(1) of the Act, 21 ?U.S.C.section 360bbb-3(b)(1), unless the authorization is terminated  ?or revoked sooner.  ? ? ?  ? Influenza A by PCR NEGATIVE NEGATIVE Final  ? Influenza B by PCR NEGATIVE NEGATIVE Final  ?  Comment: (NOTE) ?The Xpert Xpress SARS-CoV-2/FLU/RSV plus assay is intended as an aid ?in the diagnosis of influenza from Nasopharyngeal swab specimens  and ?should not be used as a sole basis for treatment. Nasal washings and ?aspirates are unacceptable for Xpert Xpress SARS-CoV-2/FLU/RSV ?testing. ? ?Fact Sheet for Patients: ?https://barnett.com/

## 2022-03-19 NOTE — Plan of Care (Signed)
?  Problem: Acute Rehab PT Goals(only PT should resolve) ?Goal: Pt Will Go Supine/Side To Sit ?Flowsheets (Taken 03/19/2022 1010) ?Pt will go Supine/Side to Sit: with modified independence ?Goal: Patient Will Transfer Sit To/From Stand ?Flowsheets (Taken 03/19/2022 1010) ?Patient will transfer sit to/from stand: with modified independence ?Note: No verbal cues  ?Goal: Pt Will Transfer Bed To Chair/Chair To Bed ?Flowsheets (Taken 03/19/2022 1010) ?Pt will Transfer Bed to Chair/Chair to Bed: with modified independence ?Note: No verbal cues ?Goal: Pt Will Ambulate ?Flowsheets (Taken 03/19/2022 1010) ?Pt will Ambulate: ? 100 feet ? with supervision ? with rolling walker ?Note: Min verbal cues ?Goal: Pt/caregiver will Perform Home Exercise Program ?Flowsheets (Taken 03/19/2022 1010) ?Pt/caregiver will Perform Home Exercise Program: ? Independently ? For increased strengthening ? For improved balance ? ?10:12 AM, 03/19/22 ?Josue Hector PT DPT  ?Physical Therapist with [facility]  ?[facility]  ?[phone] ? ?  ?

## 2022-03-20 ENCOUNTER — Other Ambulatory Visit: Payer: Self-pay | Admitting: Physician Assistant

## 2022-03-20 DIAGNOSIS — N179 Acute kidney failure, unspecified: Secondary | ICD-10-CM | POA: Diagnosis not present

## 2022-03-20 DIAGNOSIS — E1169 Type 2 diabetes mellitus with other specified complication: Secondary | ICD-10-CM | POA: Diagnosis not present

## 2022-03-20 DIAGNOSIS — R778 Other specified abnormalities of plasma proteins: Secondary | ICD-10-CM | POA: Diagnosis not present

## 2022-03-20 DIAGNOSIS — R0602 Shortness of breath: Secondary | ICD-10-CM | POA: Diagnosis not present

## 2022-03-20 LAB — CBC
HCT: 37.8 % — ABNORMAL LOW (ref 39.0–52.0)
Hemoglobin: 11.3 g/dL — ABNORMAL LOW (ref 13.0–17.0)
MCH: 24.2 pg — ABNORMAL LOW (ref 26.0–34.0)
MCHC: 29.9 g/dL — ABNORMAL LOW (ref 30.0–36.0)
MCV: 80.9 fL (ref 80.0–100.0)
Platelets: 122 10*3/uL — ABNORMAL LOW (ref 150–400)
RBC: 4.67 MIL/uL (ref 4.22–5.81)
RDW: 17.3 % — ABNORMAL HIGH (ref 11.5–15.5)
WBC: 5.5 10*3/uL (ref 4.0–10.5)
nRBC: 0 % (ref 0.0–0.2)

## 2022-03-20 LAB — GLUCOSE, CAPILLARY
Glucose-Capillary: 102 mg/dL — ABNORMAL HIGH (ref 70–99)
Glucose-Capillary: 154 mg/dL — ABNORMAL HIGH (ref 70–99)
Glucose-Capillary: 174 mg/dL — ABNORMAL HIGH (ref 70–99)

## 2022-03-20 LAB — BASIC METABOLIC PANEL
Anion gap: 7 (ref 5–15)
BUN: 39 mg/dL — ABNORMAL HIGH (ref 8–23)
CO2: 22 mmol/L (ref 22–32)
Calcium: 8.3 mg/dL — ABNORMAL LOW (ref 8.9–10.3)
Chloride: 112 mmol/L — ABNORMAL HIGH (ref 98–111)
Creatinine, Ser: 1.86 mg/dL — ABNORMAL HIGH (ref 0.61–1.24)
GFR, Estimated: 37 mL/min — ABNORMAL LOW (ref >60)
Glucose, Bld: 129 mg/dL — ABNORMAL HIGH (ref 70–99)
Potassium: 3.7 mmol/L (ref 3.5–5.1)
Sodium: 141 mmol/L (ref 135–145)

## 2022-03-20 NOTE — [place] FL2 (Signed)
?[place] MEDICAID FL2 LEVEL OF CARE SCREENING TOOL  ?  ? ?IDENTIFICATION  ?Patient Name: ?Robert Osborne Birthdate: 1946/03/22 Sex: male Admission Date (Current Location): ?03/16/2022  ?[place] and [place] Number: ? Manuel Garcia and Address:  ?[facility] [address], [place] ?     Provider Number: ?[id number]  ?Attending Physician Name and Address:  ?Little Ishikawa, MD ? Relative Name and Phone Number:  ?  ?   ?Current Level of Care: ?Hospital Recommended Level of Care: ?[place] Prior Approval Number: ?  ? ?Date Approved/Denied: ?  PASRR Number: ?  ? ?Discharge Plan: ?SNF ?  ? ?Current Diagnoses: ?Patient Active Problem List  ? Diagnosis Date Noted  ? Non-productive cough   ? Leucocytosis   ? Shortness of breath   ? Thrombocytopenia ([place])   ? CAP (community acquired pneumonia) 03/17/2022  ? Elevated troponin 03/17/2022  ? Sepsis ([place]) 03/16/2022  ? NSTEMI (non-ST elevated myocardial infarction) ([place]) 09/23/2021  ? S/P CABG x 4 08/23/2021  ? Acute anterior wall MI ([place]) 08/16/2021  ? Chest pain 08/15/2021  ? Right upper lobe pulmonary nodule 08/15/2021  ? Hypoalbuminemia due to protein-calorie malnutrition ([place]) 08/15/2021  ? AKI (acute kidney injury) ([place]) 08/15/2021  ? Hyperglycemia due to diabetes mellitus ([place]) 08/15/2021  ? BPH (benign prostatic hyperplasia) 08/15/2021  ? Hypertension associated with diabetes ([place]) 06/25/2021  ? Male erectile disorder 03/05/2021  ? Diabetic polyneuropathy associated with type 2 diabetes mellitus ([place]) 05/07/2020  ? Frontotemporal dementia with behavioral disturbance ([place]) 01/07/2020  ? Noncompliance with diabetes treatment 01/07/2020  ? Generalized weakness   ? Neurological deficit present   ? Facial droop 08/17/2019  ? Hx of bladder cancer 07/29/2019  ? Abnormal stress test 08/11/2018  ? PVC's (premature ventricular contractions) 08/11/2018  ? Hypokalemia 11/27/2017  ? Hypomagnesemia 11/27/2017  ? Major  frontotemporal neurocognitive disorder, probable, with behavioral disturbance 11/27/2017  ? Uncontrolled type 2 diabetes mellitus with hyperglycemia ([place]) 11/27/2017  ? Erectile dysfunction 10/24/2016  ? Bladder cancer ([place]) 08/18/2013  ? Diabetes mellitus ([place])   ? Vitamin D deficiency 05/17/2013  ? Hyperlipidemia associated with type 2 diabetes mellitus ([place]) 05/17/2013  ? ? ?Orientation RESPIRATION BLADDER Height & Weight   ?  ?Self, Time, Situation, Place ? Normal Incontinent, External catheter Weight: 155 lb (70.3 kg) ?Height:  '5\' 7"'$  (170.2 cm)  ?BEHAVIORAL SYMPTOMS/MOOD NEUROLOGICAL BOWEL NUTRITION STATUS  ?    Continent Diet (Carb modified)  ?AMBULATORY STATUS COMMUNICATION OF NEEDS Skin   ?Extensive Assist Verbally Normal ?  ?  ?  ?    ?     ?     ? ? ?Personal Care Assistance Level of Assistance  ?Bathing, Feeding, Dressing Bathing Assistance: Limited assistance ?Feeding assistance: Independent ?Dressing Assistance: Limited assistance ?   ? ?Functional Limitations Info  ?Sight, Hearing, Speech Sight Info: Impaired ?Hearing Info: Adequate ?Speech Info: Adequate  ? ? ?SPECIAL CARE FACTORS FREQUENCY  ?PT (By licensed PT), OT (By licensed OT)   ?  ?PT Frequency: 5 times weekly ?OT Frequency: 5 times weekly ?  ?  ?  ?   ? ? ?Contractures Contractures Info: Not present  ? ? ?Additional Factors Info  ?Code Status, Allergies Code Status Info: FULL ?Allergies Info: Enalapril ?  ?  ?  ?   ? ?Current Medications (03/20/2022):  This is the current hospital active medication list ?Current Facility-Administered Medications  ?Medication Dose Route Frequency Provider Last Rate Last Admin  ? (  feeding supplement) PROSource Plus liquid 30 mL  30 mL Oral BID BM Pahwani, Ravi, MD   30 mL at 03/20/22 0908  ? acetaminophen (TYLENOL) tablet 650 mg  650 mg Oral Q6H PRN Zierle-Ghosh, Asia B, DO   650 mg at 03/20/22 0037  ? Or  ? acetaminophen (TYLENOL) suppository 650 mg  650 mg Rectal Q6H PRN Zierle-Ghosh, Asia B, DO      ? albuterol  (PROVENTIL) (2.5 MG/3ML) 0.083% nebulizer solution 2.5 mg  2.5 mg Nebulization Q2H PRN Zierle-Ghosh, Asia B, DO      ? aspirin EC tablet 81 mg  81 mg Oral Daily Zierle-Ghosh, Asia B, DO   81 mg at 03/20/22 0908  ? azithromycin (ZITHROMAX) 500 mg in sodium chloride 0.9 % 250 mL IVPB  500 mg Intravenous Q24H Zierle-Ghosh, Asia B, DO 250 mL/hr at 03/20/22 0124 500 mg at 03/20/22 0124  ? cefTRIAXone (ROCEPHIN) 2 g in sodium chloride 0.9 % 100 mL IVPB  2 g Intravenous Q24H Zierle-Ghosh, Asia B, DO 200 mL/hr at 03/19/22 2340 2 g at 03/19/22 2340  ? clopidogrel (PLAVIX) tablet 75 mg  75 mg Oral Daily Zierle-Ghosh, Asia B, DO   75 mg at 03/20/22 0907  ? ezetimibe (ZETIA) tablet 10 mg  10 mg Oral Daily Zierle-Ghosh, Asia B, DO   10 mg at 03/20/22 0907  ? feeding supplement (BOOST / RESOURCE BREEZE) liquid 1 Container  1 Container Oral TID BM Darliss Cheney, MD   1 Container at 03/19/22 2045  ? guaiFENesin (MUCINEX) 12 hr tablet 600 mg  600 mg Oral BID Zierle-Ghosh, Asia B, DO   600 mg at 03/20/22 0907  ? heparin injection 5,000 Units  5,000 Units Subcutaneous Q8H Zierle-Ghosh, Asia B, DO   5,000 Units at 03/20/22 0600  ? insulin aspart (novoLOG) injection 0-15 Units  0-15 Units Subcutaneous TID WC Zierle-Ghosh, Asia B, DO   3 Units at 03/19/22 1737  ? insulin aspart (novoLOG) injection 0-5 Units  0-5 Units Subcutaneous QHS Zierle-Ghosh, Asia B, DO   2 Units at 03/18/22 2209  ? isosorbide mononitrate (IMDUR) 24 hr tablet 60 mg  60 mg Oral Daily Zierle-Ghosh, Asia B, DO   60 mg at 03/20/22 0907  ? metoprolol tartrate (LOPRESSOR) tablet 50 mg  50 mg Oral BID Darliss Cheney, MD   50 mg at 03/20/22 0908  ? multivitamin with minerals tablet 1 tablet  1 tablet Oral Daily Darliss Cheney, MD   1 tablet at 03/20/22 0623  ? OLANZapine (ZYPREXA) tablet 5 mg  5 mg Oral QHS Zierle-Ghosh, Asia B, DO   5 mg at 03/19/22 2108  ? ondansetron (ZOFRAN) tablet 4 mg  4 mg Oral Q6H PRN Zierle-Ghosh, Asia B, DO      ? Or  ? ondansetron (ZOFRAN)  injection 4 mg  4 mg Intravenous Q6H PRN Zierle-Ghosh, Asia B, DO      ? oxyCODONE (Oxy IR/ROXICODONE) immediate release tablet 5 mg  5 mg Oral Q4H PRN Zierle-Ghosh, Asia B, DO   5 mg at 03/20/22 0043  ? polyethylene glycol (MIRALAX / GLYCOLAX) packet 17 g  17 g Oral Daily PRN Zierle-Ghosh, Asia B, DO      ? rosuvastatin (CRESTOR) tablet 40 mg  40 mg Oral Daily Zierle-Ghosh, Asia B, DO   40 mg at 03/20/22 0907  ? tamsulosin (FLOMAX) capsule 0.4 mg  0.4 mg Oral Daily Zierle-Ghosh, Asia B, DO   0.4 mg at 03/20/22 0907  ? ? ? ?Discharge Medications: ?Please  see discharge summary for a list of discharge medications. ? ?Relevant Imaging Results: ? ?Relevant Lab Results: ? ? ?Additional Information ?SSN: [id number] ? ?Iona Beard, LCSWA ? ? ? ? ?

## 2022-03-20 NOTE — Progress Notes (Signed)
?PROGRESS NOTE ? ? ? ?Robert Osborne  TKP:[id number] DOB: 30-Apr-1946 DOA: 03/16/2022 ?PCP: System, Provider Not In ? ? ?Brief Narrative:  ?Robert Osborne is a 76 y.o. male with medical history significant of with history of bladder cancer, diabetes mellitus type 2, hyperlipidemia, hypertension, behavioral disturbance, and more presents to ED with a chief complaint of myalgias and dyspnea.  ? ?Assessment & Plan: ?  ?Principal Problem: ?  Sepsis ([place]) ?Active Problems: ?  Hyperlipidemia associated with type 2 diabetes mellitus ([place]) ?  Hypertension associated with diabetes ([place]) ?  Hypoalbuminemia due to protein-calorie malnutrition ([place]) ?  AKI (acute kidney injury) ([place]) ?  Uncontrolled type 2 diabetes mellitus with hyperglycemia ([place]) ?  CAP (community acquired pneumonia) ?  Elevated troponin ?  Non-productive cough ?  Leucocytosis ?  Shortness of breath ?  Thrombocytopenia ([place]) ? ?Severe sepsis secondary to community acquired versus aspiration pneumonia, POA, resolving ?Right lower lobe infiltrates noted at the mediastinum compared to previous ?Continue ceftriaxone, azithromycin x5 days ?Procalcitonin downtrending appropriately ? ?Acute ambulatory dysfunction ?PT/OT following - recommending SNF -family agreeable, unable to care for the patient at home safely at this time given his increased needs. ? ?Elevated troponin  ?Patient remains asymptomatic ?Troponin elevation likely supply demand mismatch secondary to above severe sepsis ?Known history of CAD s/p CABG 7 months ago ?Echo limited, 55% EF with mild grade 1 diastolic dysfunction ?Continue DAPT. ?  ?Uncontrolled diabetes mellitus type 2 with hyperglycemia  ?5 units of basal insulin at home and 2 units of mealtime insulin at home ?-Holding basal insulin, continue sliding scale coverage.  Blood sugar labile but fairly controlled.  Hemoglobin A1c 7.3. ?  ?AKI on CKD stage IIIa:  ?Likely prerenal secondary to sepsis. ?Continue to increase p.o. intake as  appropriate ?  ?Hypoalbuminemia:  ?High risk for malnutrition but not meeting criteria at this time ?  ?Essential hypertension  ?Continue Imdur and resume Lopressor.  ?  ?Hyperlipidemia associated with type 2 diabetes mellitus ([place]) ?- Continue statin medication ?  ?DVT prophylaxis: heparin ?  Code Status: Full Code  ?Family Communication:  None present ? ?Status is: Inpatient ? ?Dispo: The patient is from: Home ?             Anticipated d/c is to: SNF ?             Anticipated d/c date is: 24 to 48 hours ?             Patient currently is medically stable for discharge ? ?Consultants:  ?None ? ?Procedures:  ?None ? ?Antimicrobials:  ?Azithromycin, ceftriaxone completed for 623 ? ?Subjective: ?No acute issues or events overnight denies nausea vomiting diarrhea constipation headache fevers chills or chest pain ? ?Objective: ?Vitals:  ? 03/19/22 0941 03/19/22 1353 03/19/22 2022 03/20/22 0516  ?BP: 121/73 118/73 132/74 132/73  ?Pulse: 81 64 69 (!) 58  ?Resp:   17 16  ?Temp:  98.1 ?F (36.7 ?C) 99 ?F (37.2 ?C) 98.8 ?F (37.1 ?C)  ?TempSrc:  Oral Oral   ?SpO2:  97% 97% 99%  ?Weight:      ?Height:      ? ? ?Intake/Output Summary (Last 24 hours) at 03/20/2022 0727 ?Last data filed at 03/20/2022 0024 ?Gross per 24 hour  ?Intake 736.25 ml  ?Output 600 ml  ?Net 136.25 ml  ? ? ?Filed Weights  ? 03/16/22 2130  ?Weight: 70.3 kg  ? ? ?Examination: ? ?General:  Pleasantly resting in bed, No acute distress. ?  Lungs: Right basilar rhonchi without overt wheezes or rales. ?Heart:  Regular rate and rhythm.  Without murmurs, rubs, or gallops. ?Abdomen:  Soft, nontender, nondistended.  Without guarding or rebound. ?Extremities: Without cyanosis, clubbing, edema, or obvious deformity. ? ?Data Reviewed: I have personally reviewed following labs and imaging studies ? ?CBC: ?Recent Labs  ?Lab 03/16/22 ?2221 03/17/22 ?1517 03/18/22 ?6160 03/19/22 ?7371 03/20/22 ?0626  ?WBC 29.6* 28.3* 21.2* 12.1* 5.5  ?NEUTROABS  --  24.6* 18.0* 10.2*  --   ?HGB  12.8* 11.8* 12.3* 11.7* 11.3*  ?HCT 42.2 38.3* 38.7* 36.7* 37.8*  ?MCV 81.3 80.5 79.5* 79.4* 80.9  ?PLT 121* 121* 113* 122* 122*  ? ? ?Basic Metabolic Panel: ?Recent Labs  ?Lab 03/16/22 ?2221 03/17/22 ?9485 03/18/22 ?4627 03/19/22 ?0350 03/20/22 ?0938  ?NA 141  --  141 140 141  ?K 4.2  --  3.9 3.6 3.7  ?CL 107  --  113* 113* 112*  ?CO2 18*  --  19* 20* 22  ?GLUCOSE 112*  --  95 139* 129*  ?BUN 43*  --  44* 45* 39*  ?CREATININE 2.42*  --  2.13* 2.17* 1.86*  ?CALCIUM 8.6*  --  8.2* 8.1* 8.3*  ?MG  --  1.9  --   --   --   ? ? ?GFR: ?Estimated Creatinine Clearance: 30.6 mL/min (A) (by C-G formula based on SCr of 1.86 mg/dL (H)). ?Liver Function Tests: ?Recent Labs  ?Lab 03/16/22 ?2221  ?AST 116*  ?ALT 41  ?ALKPHOS 78  ?BILITOT 1.0  ?PROT 7.1  ?ALBUMIN 3.3*  ? ? ?No results for input(s): LIPASE, AMYLASE in the last 168 hours. ?No results for input(s): AMMONIA in the last 168 hours. ?Coagulation Profile: ?Recent Labs  ?Lab 03/17/22 ?1829  ?INR 1.7*  ? ? ?Cardiac Enzymes: ?No results for input(s): CKTOTAL, CKMB, CKMBINDEX, TROPONINI in the last 168 hours. ?BNP (last 3 results) ?No results for input(s): PROBNP in the last 8760 hours. ?HbA1C: ?No results for input(s): HGBA1C in the last 72 hours. ? ?CBG: ?Recent Labs  ?Lab 03/18/22 ?2156 03/19/22 ?9371 03/19/22 ?1145 03/19/22 ?1615 03/19/22 ?2113  ?GLUCAP 249* 118* 178* 198* 159*  ? ? ?Lipid Profile: ?No results for input(s): CHOL, HDL, LDLCALC, TRIG, CHOLHDL, LDLDIRECT in the last 72 hours. ?Thyroid Function Tests: ?No results for input(s): TSH, T4TOTAL, FREET4, T3FREE, THYROIDAB in the last 72 hours. ?Anemia Panel: ?No results for input(s): VITAMINB12, FOLATE, FERRITIN, TIBC, IRON, RETICCTPCT in the last 72 hours. ?Sepsis Labs: ?Recent Labs  ?Lab 03/17/22 ?0000 03/17/22 ?6967 03/17/22 ?8938 03/17/22 ?1227 03/18/22 ?1017  ?PROCALCITON  --  >150.00  --   --  85.67  ?LATICACIDVEN 5.0* 3.7* 2.4* 1.7  --   ? ? ? ?Recent Results (from the past 240 hour(s))  ?Resp Panel by RT-PCR  (Flu A&B, Covid) Nasopharyngeal Swab     Status: None  ? Collection Time: 03/16/22 11:19 PM  ? Specimen: Nasopharyngeal Swab; Nasopharyngeal(NP) swabs in vial transport medium  ?Result Value Ref Range Status  ? SARS Coronavirus 2 by RT PCR NEGATIVE NEGATIVE Final  ?  Comment: (NOTE) ?SARS-CoV-2 target nucleic acids are NOT DETECTED. ? ?The SARS-CoV-2 RNA is generally detectable in upper respiratory ?specimens during the acute phase of infection. The lowest ?concentration of SARS-CoV-2 viral copies this assay can detect is ?138 copies/mL. A negative result does not preclude SARS-Cov-2 ?infection and should not be used as the sole basis for treatment or ?other patient management decisions. A negative result may occur with  ?improper specimen  collection/handling, submission of specimen other ?than nasopharyngeal swab, presence of viral mutation(s) within the ?areas targeted by this assay, and inadequate number of viral ?copies(<138 copies/mL). A negative result must be combined with ?clinical observations, patient history, and epidemiological ?information. The expected result is Negative. ? ?Fact Sheet for Patients:  ?EntrepreneurPulse.com.au ? ?Fact Sheet for Healthcare Providers:  ?IncredibleEmployment.be ? ?This test is no t yet approved or cleared by the [place] FDA and  ?has been authorized for detection and/or diagnosis of SARS-CoV-2 by ?FDA under an Emergency Use Authorization (EUA). This EUA will remain  ?in effect (meaning this test can be used) for the duration of the ?COVID-19 declaration under Section 564(b)(1) of the Act, 21 ?U.S.C.section 360bbb-3(b)(1), unless the authorization is terminated  ?or revoked sooner.  ? ? ?  ? Influenza A by PCR NEGATIVE NEGATIVE Final  ? Influenza B by PCR NEGATIVE NEGATIVE Final  ?  Comment: (NOTE) ?The Xpert Xpress SARS-CoV-2/FLU/RSV plus assay is intended as an aid ?in the diagnosis of influenza from Nasopharyngeal swab specimens  and ?should not be used as a sole basis for treatment. Nasal washings and ?aspirates are unacceptable for Xpert Xpress SARS-CoV-2/FLU/RSV ?testing. ? ?Fact Sheet for Patients: ?https://waller.org/

## 2022-03-20 NOTE — TOC Progression Note (Addendum)
Transition of Care (TOC) - Progression Note  ? ? ?Patient Details  ?Name: Robert Osborne ?MRN: [id number] ?Date of Birth: Aug 06, 1946 ? ?Transition of Care (TOC) CM/SW Contact  ?Shade Flood, LCSW ?Phone Number: ?03/20/2022, 9:39 AM ? ?Clinical Narrative:    ? ?TOC following. PT recommending SNF rehab at dc. Spoke with pt and also his wife to review recommendation. They are agreeable to SNF referrals. CMS provider options reviewed. Pt's wife requests referral to facility in [facility]. Pt will need PASRR and insurance auth. TOC will start PASRR today. SNF will need to start auth once facility found.  ? ?1300: Pt accepted at [facility]. They will start auth today. Pt's PASRR complete. TOC will follow. ? ?Expected Discharge Plan: [place] ?Barriers to Discharge: Continued Medical Work up ? ?Expected Discharge Plan and Services ?Expected Discharge Plan: [place] ?In-house Referral: Clinical Social Work ?  ?  ?Living arrangements for the past 2 months: [place] ?                ?  ?  ?  ?  ?  ?  ?  ?  ?  ?  ? ? ?Social Determinants of Health (SDOH) Interventions ?  ? ?Readmission Risk Interventions ? ?  03/17/2022  ? 11:21 AM 08/27/2021  ? 12:30 PM  ?Readmission Risk Prevention Plan  ?Transportation Screening Complete Complete  ?PCP or Specialist Appt within 3-5 Days  Complete  ?[place] or Home Care Consult  Complete  ?Social Work Consult for [place] Planning/Counseling  Complete  ?Palliative Care Screening  Not Applicable  ?Medication Review Press photographer) Complete Complete  ?[place] or Home Care Consult Complete   ?SW Recovery Care/Counseling Consult Complete   ?Palliative Care Screening Not Applicable   ?[place] Not Applicable   ? ? ?

## 2022-03-21 ENCOUNTER — Encounter (HOSPITAL_COMMUNITY): Payer: Medicare HMO

## 2022-03-21 DIAGNOSIS — J189 Pneumonia, unspecified organism: Secondary | ICD-10-CM | POA: Diagnosis not present

## 2022-03-21 DIAGNOSIS — D72825 Bandemia: Secondary | ICD-10-CM

## 2022-03-21 DIAGNOSIS — N179 Acute kidney failure, unspecified: Secondary | ICD-10-CM | POA: Diagnosis not present

## 2022-03-21 DIAGNOSIS — A419 Sepsis, unspecified organism: Secondary | ICD-10-CM | POA: Diagnosis not present

## 2022-03-21 DIAGNOSIS — R0602 Shortness of breath: Secondary | ICD-10-CM | POA: Diagnosis not present

## 2022-03-21 LAB — GLUCOSE, CAPILLARY
Glucose-Capillary: 163 mg/dL — ABNORMAL HIGH (ref 70–99)
Glucose-Capillary: 125 mg/dL — ABNORMAL HIGH (ref 70–99)
Glucose-Capillary: 139 mg/dL — ABNORMAL HIGH (ref 70–99)
Glucose-Capillary: 112 mg/dL — ABNORMAL HIGH (ref 70–99)
Glucose-Capillary: 137 mg/dL — ABNORMAL HIGH (ref 70–99)

## 2022-03-21 NOTE — Progress Notes (Signed)
Pt requested PRN pain meds for arm pain related to IV. New IV started due to infiltration ?Robert Osborne ? ?

## 2022-03-21 NOTE — Progress Notes (Signed)
Physical Therapy Treatment ?Patient Details ?Name: Robert Osborne ?MRN: [id number] ?DOB: 02-Jan-1946 ?Today's Date: 03/21/2022 ? ? ?History of Present Illness Robert Osborne is a 76 y.o. male with medical history significant of with history of bladder cancer, diabetes mellitus type 2, hyperlipidemia, hypertension, behavioral disturbance, and more presents to ED with a chief complaint of myalgias and dyspnea.  Patient reports myalgias of been going on since September.  He does not describe them as a burning pain but an aching pain.  He got worse just hours prior to presentation.  Patient reports that his dyspnea started night before presentation.  He had associated wheezing and coughing productive of white sputum.  His dyspnea is exertional.  He had no associated chest pain, palpitations, dizziness/syncope.  Patient reports he has an inhaler and he used it 3 times during the day, and it only offered temporary relief each time.  Patient reports prior to his dyspnea starting he had an episode of nausea and vomiting with choking.  His last normal meal was breakfast on the day of presentation.  Last normal bowel movement was in the morning of the day of presentation.  Patient reports he does have a left lower quadrant pain.  It started last night after the nausea and vomiting.  It is intermittent.  It is not present now.  Patient also reports that he had dysuria a couple of weeks ago, he presented to his PCP who gave him some medication (presumably an antibiotic), and his pain went away.  Patient reports headaches today.  They are not both of his temple and go across the front of his head.  He has had no change in vision, no change in hearing.  Tylenol does help the pain.  Patient has no other complaints at this time. ? ?  ?PT Comments  ? ? Patient continues to demonstrate strength, gait, and balance deficits requiring min G/A for mobility. He requires assist to pull to seated EOB. Demonstrates good sitting tolerance and  sitting balance at EOB and on commode. Patient with improving activity tolerance and ambulation ability with increased distance ambulated today. Patient sat in chair at end of session but then requested to return to bed due to history of LBP. Patient will benefit from continued skilled physical therapy in hospital and recommended venue below to increase strength, balance, endurance for safe ADLs and gait. ?  ?Recommendations for follow up therapy are one component of a multi-disciplinary discharge planning process, led by the attending physician.  Recommendations may be updated based on patient status, additional functional criteria and insurance authorization. ? ?Follow Up Recommendations ? Other (comment) (Likely HH candidate, unless no home support as stated or wife unable, in that case SNF) ?  ?  ?Assistance Recommended at Discharge Intermittent Supervision/Assistance  ?Patient can return home with the following A little help with walking and/or transfers;A little help with bathing/dressing/bathroom;Help with stairs or ramp for entrance ?  ?Equipment Recommendations ? None recommended by PT  ?  ?Recommendations for Other Services   ? ? ?  ?Precautions / Restrictions Precautions ?Precautions: Fall ?Restrictions ?Weight Bearing Restrictions: No  ?  ? ?Mobility ? Bed Mobility ?Overal bed mobility: Needs Assistance ?Bed Mobility: Supine to Sit, Sit to Supine ?  ?  ?Supine to sit: Min assist, HOB elevated ?Sit to supine: Supervision ?  ?General bed mobility comments: Very slow labored movement and extended time, requires assist to pull to seated and upright trunk ?  ? ?Transfers ?Overall transfer level: Needs  assistance ?Equipment used: Rolling walker (2 wheels) ?Transfers: Sit to/from Stand, Bed to chair/wheelchair/BSC ?Sit to Stand: Min guard ?  ?Step pivot transfers: Min guard ?  ?  ?  ?General transfer comment: slow labored movement, relies on UEs ?  ? ?Ambulation/Gait ?Ambulation/Gait assistance: Min guard ?Gait  Distance (Feet): 30 Feet ?Assistive device: Rolling walker (2 wheels) ?Gait Pattern/deviations: Decreased stride length ?Gait velocity: slow ?  ?  ?General Gait Details: small step, very slow gait, flexed trunk, RLE held in ER ? ? ?Stairs ?  ?  ?  ?  ?  ? ? ?Wheelchair Mobility ?  ? ?Modified Rankin (Stroke Patients Only) ?  ? ? ?  ?Balance Overall balance assessment: Needs assistance ?Sitting-balance support: Feet supported, No upper extremity supported ?Sitting balance-Leahy Scale: Poor ?Sitting balance - Comments: poor to fair seated EOB ?  ?Standing balance support: Bilateral upper extremity supported, During functional activity ?Standing balance-Leahy Scale: Poor ?Standing balance comment: Requires RW support to stand ?  ?  ?  ?  ?  ?  ?  ?  ?  ?  ?  ?  ? ?  ?Cognition Arousal/Alertness: Awake/alert ?Behavior During Therapy: [facility] for tasks assessed/performed ?Overall Cognitive Status: Within Functional Limits for tasks assessed ?  ?  ?  ?  ?  ?  ?  ?  ?  ?  ?  ?  ?  ?  ?  ?  ?  ?  ?  ? ?  ?Exercises   ? ?  ?General Comments   ?  ?  ? ?Pertinent Vitals/Pain Pain Assessment ?Pain Assessment: No/denies pain  ? ? ?Home Living   ?  ?  ?  ?  ?  ?  ?  ?  ?  ?   ?  ?Prior Function    ?  ?  ?   ? ?PT Goals (current goals can now be found in the care plan section) Acute Rehab PT Goals ?Patient Stated Goal: Return home ?PT Goal Formulation: With patient ?Time For Goal Achievement: 04/02/22 ?Potential to Achieve Goals: Good ?Progress towards PT goals: Progressing toward goals ? ?  ?Frequency ? ? ? Min 3X/week ? ? ? ?  ?PT Plan Current plan remains appropriate  ? ? ?Co-evaluation   ?  ?  ?  ?  ? ?  ?AM-PAC PT "6 Clicks" Mobility   ?Outcome Measure ? Help needed turning from your back to your side while in a flat bed without using bedrails?: A Little ?Help needed moving from lying on your back to sitting on the side of a flat bed without using bedrails?: A Little ?Help needed moving to and from a bed to a chair (including a  wheelchair)?: A Little ?Help needed standing up from a chair using your arms (e.g., wheelchair or bedside chair)?: A Little ?Help needed to walk in hospital room?: A Little ?Help needed climbing 3-5 steps with a railing? : A Lot ?6 Click Score: 17 ? ?  ?End of Session Equipment Utilized During Treatment: Gait belt ?Activity Tolerance: Patient limited by fatigue ?Patient left: in bed;with call bell/phone within reach;with bed alarm set ?Nurse Communication: Mobility status ?PT Visit Diagnosis: Muscle weakness (generalized) (M62.81);Other abnormalities of gait and mobility (R26.89);Unsteadiness on feet (R26.81);Difficulty in walking, not elsewhere classified (R26.2) ?  ? ? ?Time: 5366-4403 ?PT Time Calculation (min) (ACUTE ONLY): 29 min ? ?Charges:  $Therapeutic Activity: 23-37 mins          ?          ? ?  3:41 PM, 03/21/22 ?Mearl Latin PT, DPT ?Physical Therapist at [facility] ?[facility] ? ? ?

## 2022-03-21 NOTE — Progress Notes (Signed)
?PROGRESS NOTE ? ? ? ?Robert Osborne  XIP:[id number] DOB: 08-03-1946 DOA: 03/16/2022 ?PCP: System, Provider Not In ? ? ?Brief Narrative:  ?Robert Osborne is a 76 y.o. male with medical history significant of with history of bladder cancer, diabetes mellitus type 2, hyperlipidemia, hypertension, behavioral disturbance, and more presents to ED with a chief complaint of myalgias and dyspnea.  ? ?Assessment & Plan: ?  ?Principal Problem: ?  Sepsis ([place]) ?Active Problems: ?  Hyperlipidemia associated with type 2 diabetes mellitus ([place]) ?  Hypertension associated with diabetes ([place]) ?  Hypoalbuminemia due to protein-calorie malnutrition ([place]) ?  AKI (acute kidney injury) ([place]) ?  Uncontrolled type 2 diabetes mellitus with hyperglycemia ([place]) ?  CAP (community acquired pneumonia) ?  Elevated troponin ?  Non-productive cough ?  Leucocytosis ?  Shortness of breath ?  Thrombocytopenia ([place]) ? ?Severe sepsis secondary to community acquired versus aspiration pneumonia, POA, resolving ?Right lower lobe infiltrates noted at the mediastinum compared to previous ?Completed ceftriaxone, azithromycin ?Respiratory status back to baseline ?Speech evaluated, continue regular diet thin liquids ? ?Acute ambulatory dysfunction ?PT/OT following - recommending SNF -family/patient agreeable, unable to care for the patient at home safely at this time given his increased needs. ? ?Elevated troponin  ?Patient remains asymptomatic ?Troponin elevation likely supply demand mismatch secondary to above severe sepsis/pneumonia ?Known history of CAD s/p CABG 7 months ago ?Echo limited, 55% EF with mild grade 1 diastolic dysfunction ?Continue DAPT. ?  ?Uncontrolled diabetes mellitus type 2 with hyperglycemia  ?5 units of basal insulin at home and 2 units of mealtime insulin at home ?-Holding basal insulin, continue sliding scale coverage.  Blood sugar labile but fairly controlled.  Hemoglobin A1c 7.3. ?  ?AKI on CKD stage IIIa:  ?Likely prerenal  secondary to sepsis. ?Continue to increase p.o. intake as appropriate ?Improving, approaching baseline -no further need for IV fluids ?  ?Hypoalbuminemia:  ?High risk for malnutrition but not meeting criteria at this time ?  ?Essential hypertension  ?Continue Imdur and resume Lopressor.  ?  ?Hyperlipidemia associated with type 2 diabetes mellitus ([place]) ?- Continue statin medication ?  ?DVT prophylaxis: heparin ?  Code Status: Full Code  ?Family Communication:  None present ? ?Status is: Inpatient ? ?Dispo: The patient is from: Home ?             Anticipated d/c is to: SNF ?             Anticipated d/c date is: 24 to 48 hours ?             Patient currently is medically stable for discharge ? ?Consultants:  ?None ? ?Procedures:  ?None ? ?Antimicrobials:  ?Azithromycin, ceftriaxone completed 03/20/22 ? ?Subjective: ?No acute issues or events overnight denies nausea vomiting diarrhea constipation headache fevers chills chest pain, out of bed today to chair tolerating breakfast well. ? ?Objective: ?Vitals:  ? 03/20/22 1349 03/20/22 2040 03/20/22 2109 03/21/22 0356  ?BP: 128/69 (!) 142/75 (!) 146/77 (!) 154/82  ?Pulse: 67 67 66 68  ?Resp: '20 18 20 18  '$ ?Temp: 98 ?F (36.7 ?C) 98.6 ?F (37 ?C) 98.2 ?F (36.8 ?C) 98.7 ?F (37.1 ?C)  ?TempSrc: Oral Oral Oral Oral  ?SpO2:  99% 100% 100%  ?Weight:      ?Height:      ? ? ?Intake/Output Summary (Last 24 hours) at 03/21/2022 0715 ?Last data filed at 03/21/2022 0300 ?Gross per 24 hour  ?Intake 760.43 ml  ?Output 1550 ml  ?Net -789.57  ml  ? ? ?Filed Weights  ? 03/16/22 2130  ?Weight: 70.3 kg  ? ? ?Examination: ? ?General:  Pleasantly resting in bed, No acute distress. ?Lungs: Right basilar rhonchi without overt wheezes or rales. ?Heart:  Regular rate and rhythm.  Without murmurs, rubs, or gallops. ?Abdomen:  Soft, nontender, nondistended.  Without guarding or rebound. ?Extremities: Without cyanosis, clubbing, edema, or obvious deformity. ? ?Data Reviewed: I have personally reviewed following  labs and imaging studies ? ?CBC: ?Recent Labs  ?Lab 03/16/22 ?2221 03/17/22 ?1610 03/18/22 ?9604 03/19/22 ?5409 03/20/22 ?8119  ?WBC 29.6* 28.3* 21.2* 12.1* 5.5  ?NEUTROABS  --  24.6* 18.0* 10.2*  --   ?HGB 12.8* 11.8* 12.3* 11.7* 11.3*  ?HCT 42.2 38.3* 38.7* 36.7* 37.8*  ?MCV 81.3 80.5 79.5* 79.4* 80.9  ?PLT 121* 121* 113* 122* 122*  ? ? ?Basic Metabolic Panel: ?Recent Labs  ?Lab 03/16/22 ?2221 03/17/22 ?1478 03/18/22 ?2956 03/19/22 ?2130 03/20/22 ?8657  ?NA 141  --  141 140 141  ?K 4.2  --  3.9 3.6 3.7  ?CL 107  --  113* 113* 112*  ?CO2 18*  --  19* 20* 22  ?GLUCOSE 112*  --  95 139* 129*  ?BUN 43*  --  44* 45* 39*  ?CREATININE 2.42*  --  2.13* 2.17* 1.86*  ?CALCIUM 8.6*  --  8.2* 8.1* 8.3*  ?MG  --  1.9  --   --   --   ? ? ?GFR: ?Estimated Creatinine Clearance: 30.6 mL/min (A) (by C-G formula based on SCr of 1.86 mg/dL (H)). ?Liver Function Tests: ?Recent Labs  ?Lab 03/16/22 ?2221  ?AST 116*  ?ALT 41  ?ALKPHOS 78  ?BILITOT 1.0  ?PROT 7.1  ?ALBUMIN 3.3*  ? ? ?No results for input(s): LIPASE, AMYLASE in the last 168 hours. ?No results for input(s): AMMONIA in the last 168 hours. ?Coagulation Profile: ?Recent Labs  ?Lab 03/17/22 ?8469  ?INR 1.7*  ? ? ?Cardiac Enzymes: ?No results for input(s): CKTOTAL, CKMB, CKMBINDEX, TROPONINI in the last 168 hours. ?BNP (last 3 results) ?No results for input(s): PROBNP in the last 8760 hours. ?HbA1C: ?No results for input(s): HGBA1C in the last 72 hours. ? ?CBG: ?Recent Labs  ?Lab 03/19/22 ?1615 03/19/22 ?2113 03/20/22 ?6295 03/20/22 ?1048 03/20/22 ?2111  ?GLUCAP 198* 159* 102* 154* 174*  ? ? ?Lipid Profile: ?No results for input(s): CHOL, HDL, LDLCALC, TRIG, CHOLHDL, LDLDIRECT in the last 72 hours. ?Thyroid Function Tests: ?No results for input(s): TSH, T4TOTAL, FREET4, T3FREE, THYROIDAB in the last 72 hours. ?Anemia Panel: ?No results for input(s): VITAMINB12, FOLATE, FERRITIN, TIBC, IRON, RETICCTPCT in the last 72 hours. ?Sepsis Labs: ?Recent Labs  ?Lab 03/17/22 ?0000  03/17/22 ?2841 03/17/22 ?3244 03/17/22 ?1227 03/18/22 ?0102  ?PROCALCITON  --  >150.00  --   --  85.67  ?LATICACIDVEN 5.0* 3.7* 2.4* 1.7  --   ? ? ? ?Recent Results (from the past 240 hour(s))  ?Resp Panel by RT-PCR (Flu A&B, Covid) Nasopharyngeal Swab     Status: None  ? Collection Time: 03/16/22 11:19 PM  ? Specimen: Nasopharyngeal Swab; Nasopharyngeal(NP) swabs in vial transport medium  ?Result Value Ref Range Status  ? SARS Coronavirus 2 by RT PCR NEGATIVE NEGATIVE Final  ?  Comment: (NOTE) ?SARS-CoV-2 target nucleic acids are NOT DETECTED. ? ?The SARS-CoV-2 RNA is generally detectable in upper respiratory ?specimens during the acute phase of infection. The lowest ?concentration of SARS-CoV-2 viral copies this assay can detect is ?138 copies/mL. A negative result does not  preclude SARS-Cov-2 ?infection and should not be used as the sole basis for treatment or ?other patient management decisions. A negative result may occur with  ?improper specimen collection/handling, submission of specimen other ?than nasopharyngeal swab, presence of viral mutation(s) within the ?areas targeted by this assay, and inadequate number of viral ?copies(<138 copies/mL). A negative result must be combined with ?clinical observations, patient history, and epidemiological ?information. The expected result is Negative. ? ?Fact Sheet for Patients:  ?EntrepreneurPulse.com.au ? ?Fact Sheet for Healthcare Providers:  ?IncredibleEmployment.be ? ?This test is no t yet approved or cleared by the [place] FDA and  ?has been authorized for detection and/or diagnosis of SARS-CoV-2 by ?FDA under an Emergency Use Authorization (EUA). This EUA will remain  ?in effect (meaning this test can be used) for the duration of the ?COVID-19 declaration under Section 564(b)(1) of the Act, 21 ?U.S.C.section 360bbb-3(b)(1), unless the authorization is terminated  ?or revoked sooner.  ? ? ?  ? Influenza A by PCR NEGATIVE  NEGATIVE Final  ? Influenza B by PCR NEGATIVE NEGATIVE Final  ?  Comment: (NOTE) ?The Xpert Xpress SARS-CoV-2/FLU/RSV plus assay is intended as an aid ?in the diagnosis of influenza from Nasopharyngeal swab specimens

## 2022-03-21 NOTE — Care Management Important Message (Signed)
Important Message ? ?Patient Details  ?Name: Robert Osborne ?MRN: [id number] ?Date of Birth: 1946-07-11 ? ? ?Medicare Important Message Given:  Yes ? ?Reviewed Medicare IM with patient via room phone [phone]).  Copy of Medicare IM mailed to home address on file per patient request.   ? ? ?Dannette Barbara ?03/21/2022, 10:17 AM ?

## 2022-03-22 DIAGNOSIS — A419 Sepsis, unspecified organism: Secondary | ICD-10-CM | POA: Diagnosis not present

## 2022-03-22 DIAGNOSIS — N179 Acute kidney failure, unspecified: Secondary | ICD-10-CM | POA: Diagnosis not present

## 2022-03-22 DIAGNOSIS — R6520 Severe sepsis without septic shock: Secondary | ICD-10-CM | POA: Diagnosis not present

## 2022-03-22 LAB — GLUCOSE, CAPILLARY
Glucose-Capillary: 97 mg/dL (ref 70–99)
Glucose-Capillary: 137 mg/dL — ABNORMAL HIGH (ref 70–99)
Glucose-Capillary: 198 mg/dL — ABNORMAL HIGH (ref 70–99)
Glucose-Capillary: 152 mg/dL — ABNORMAL HIGH (ref 70–99)

## 2022-03-22 LAB — CULTURE, BLOOD (ROUTINE X 2)
Culture: NO GROWTH
Culture: NO GROWTH
Special Requests: ADEQUATE
Special Requests: ADEQUATE

## 2022-03-22 LAB — BASIC METABOLIC PANEL WITH GFR
Anion gap: 9 (ref 5–15)
BUN: 28 mg/dL — ABNORMAL HIGH (ref 8–23)
CO2: 23 mmol/L (ref 22–32)
Calcium: 8.6 mg/dL — ABNORMAL LOW (ref 8.9–10.3)
Chloride: 108 mmol/L (ref 98–111)
Creatinine, Ser: 1.87 mg/dL — ABNORMAL HIGH (ref 0.61–1.24)
GFR, Estimated: 36 mL/min — ABNORMAL LOW
Glucose, Bld: 126 mg/dL — ABNORMAL HIGH (ref 70–99)
Potassium: 3.8 mmol/L (ref 3.5–5.1)
Sodium: 140 mmol/L (ref 135–145)

## 2022-03-22 LAB — CBC
HCT: 39.1 % (ref 39.0–52.0)
Hemoglobin: 12.6 g/dL — ABNORMAL LOW (ref 13.0–17.0)
MCH: 25.5 pg — ABNORMAL LOW (ref 26.0–34.0)
MCHC: 32.2 g/dL (ref 30.0–36.0)
MCV: 79.1 fL — ABNORMAL LOW (ref 80.0–100.0)
Platelets: 133 10*3/uL — ABNORMAL LOW (ref 150–400)
RBC: 4.94 MIL/uL (ref 4.22–5.81)
RDW: 17.5 % — ABNORMAL HIGH (ref 11.5–15.5)
WBC: 4.8 10*3/uL (ref 4.0–10.5)
nRBC: 0 % (ref 0.0–0.2)

## 2022-03-22 MED ORDER — TRESIBA FLEXTOUCH 100 UNIT/ML ~~LOC~~ SOPN: 5.0000 [IU] | PEN_INJECTOR | Freq: Every day | SUBCUTANEOUS | 0 refills | Status: DC

## 2022-03-22 MED ORDER — PROSOURCE PLUS PO LIQD: 30.0000 mL | Freq: Two times a day (BID) | ORAL | 1 refills | Status: DC

## 2022-03-22 NOTE — Discharge Summary (Signed)
Physician Discharge Summary  ?OZ GAMMEL MQK:[id number] DOB: January 01, 1946 DOA: 03/16/2022 ? ?PCP: System, Provider Not In ? ?Admit date: 03/16/2022 ?Discharge date: 03/22/2022 ? ?Admitted From: Home ?Disposition: SNF ? ?Recommendations for Outpatient Follow-up:  ?Follow up with PCP in 1-2 weeks ?Please obtain BMP/CBC in one week ? ?Discharge Condition: Stable ?CODE STATUS: Full ?Diet recommendation: Low-salt low-fat diet ? ?Brief/Interim Summary: ?Robert Osborne is a 76 y.o. male with medical history significant of with history of bladder cancer, diabetes mellitus type 2, hyperlipidemia, hypertension, behavioral disturbance, and more presents to ED with a chief complaint of myalgias and dyspnea.  ? ?Patient admitted with sepsis secondary [facility] pneumonia versus aspiration pneumonia, symptoms improved quite drastically over the past 48 hours, unfortunately during patient's hospitalization he was noted to be profoundly weak with ongoing acute ambulatory dysfunction.  Patient had elevated troponin in the setting of pneumonia likely supply/demand mismatch.  Patient's other chronic comorbid conditions have stabilized, AKI improved with IV fluids, hypertension hyperlipidemia stable diabetes while poorly controlled outpatient has improved here on diabetic diet.  Patient otherwise stable and agreeable for discharge to SNF for ongoing physical therapy with ultimate plan to return home once stable and more independently ambulatory. ? ?Discharge Diagnoses:  ?Principal Problem: ?  Sepsis ([place]) ?Active Problems: ?  Hyperlipidemia associated with type 2 diabetes mellitus ([place]) ?  Hypertension associated with diabetes ([place]) ?  Hypoalbuminemia due to protein-calorie malnutrition ([place]) ?  AKI (acute kidney injury) ([place]) ?  Uncontrolled type 2 diabetes mellitus with hyperglycemia ([place]) ?  CAP (community acquired pneumonia) ?  Elevated troponin ?  Non-productive cough ?  Leucocytosis ?  Shortness of breath ?  Thrombocytopenia  ([place]) ? ? ? ?Discharge Instructions ? ?Discharge Instructions   ? ? Discharge patient   Complete by: As directed ?  ? Discharge disposition: 03-Skilled Nursing Facility  ? Discharge patient date: 03/22/2022  ? ?  ? ?Allergies as of 03/22/2022   ? ?   Reactions  ? Enalapril Cough  ? ?  ? ?  ?Medication List  ?  ? ?STOP taking these medications   ? ?insulin aspart 100 UNIT/ML FlexPen ?Commonly known as: NOVOLOG ?  ?levofloxacin 500 MG tablet ?Commonly known as: LEVAQUIN ?  ?oxyCODONE 5 MG immediate release tablet ?Commonly known as: Oxy IR/ROXICODONE ?  ? ?  ? ?TAKE these medications   ? ?(feeding supplement) PROSource Plus liquid ?Take 30 mLs by mouth 2 (two) times daily between meals. ?  ?aspirin 81 MG EC tablet ?Take 1 tablet (81 mg total) by mouth daily. Swallow whole. ?  ?blood glucose meter kit and supplies ?Dispense based on patient and insurance preference. Use up to four times daily as directed. (FOR ICD-10 E10.9, E11.9). ?  ?blood glucose meter kit and supplies ?Use up to four times daily as directed. dx E11.9 ?  ?blood glucose meter kit and supplies Kit ?Dispense based on patient and insurance preference. Use up to twice a daily as directed. (FOR ICD-10 - type 2 DM uncontrolled E11.9) ?  ?clopidogrel 75 MG tablet ?Commonly known as: PLAVIX ?Take 1 tablet (75 mg total) by mouth daily. ?  ?dapagliflozin propanediol 10 MG Tabs tablet ?Commonly known as: FARXIGA ?Take 1 tablet (10 mg total) by mouth daily. ?  ?ezetimibe 10 MG tablet ?Commonly known as: ZETIA ?Take 1 tablet (10 mg total) by mouth daily. ?  ?glucose blood test strip ?Use as instructed ?  ?isosorbide mononitrate 60 MG 24 hr tablet ?Commonly known as: IMDUR ?TAKE 1  TABLET BY MOUTH EVERY DAY ?  ?metoprolol tartrate 50 MG tablet ?Commonly known as: LOPRESSOR ?Take 1 tablet (50 mg total) by mouth 2 (two) times daily. ?  ?multivitamin with minerals Tabs tablet ?Take 1 tablet by mouth daily. ?  ?nitroGLYCERIN 0.4 MG SL tablet ?Commonly known as:  NITROSTAT ?Place 0.4 mg under the tongue every 5 (five) minutes as needed for chest pain. ?  ?OLANZapine 5 MG tablet ?Commonly known as: ZYPREXA ?Take 5 mg by mouth at bedtime. ?  ?ONE TOUCH ULTRA 2 w/Device Kit ?USE TO CHECK BLOOD SUGAR UP TO 4 TIMES DAILY AS DIRECTED ?  ?OneTouch Delica Plus [id number] Misc ?USE TO CHECK BLOOD SUGAR UP TO 4 TIMES A DAY AS DIRECTED ?  ?Pen Needles 32G X 4 MM Misc ?100 each by Does not apply route 4 (four) times daily. E10.9 ?  ?rosuvastatin 40 MG tablet ?Commonly known as: CRESTOR ?Take 1 tablet (40 mg total) by mouth daily. ?  ?tamsulosin 0.4 MG Caps capsule ?Commonly known as: FLOMAX ?Take 0.4 mg by mouth daily. ?  ?Tyler Aas FlexTouch 100 UNIT/ML FlexTouch Pen ?Generic drug: insulin degludec ?Inject 5 Units into the skin daily. ?What changed: how much to take ?  ? ?  ? ? ?Allergies  ?Allergen Reactions  ? Enalapril Cough  ? ? ?Consultations: ?None ?Procedures/Studies: ?DG CHEST PORT 1 VIEW ? ?Result Date: 03/17/2022 ?CLINICAL DATA:  Sepsis EXAM: PORTABLE CHEST 1 VIEW COMPARISON:  03/16/2022 FINDINGS: Sternotomy wires overlie normal cardiac silhouette. Linear atelectasis in the LEFT lower lobe. Density posterior the RIGHT heart. No pulmonary edema no pneumothorax. IMPRESSION: Density posterior the RIGHT heart. Cannot exclude pneumonia, atelectasis, or nodule. Electronically Signed   By: Suzy Bouchard M.D.   On: 03/17/2022 08:14  ? ?DG Chest Portable 1 View ? ?Result Date: 03/16/2022 ?CLINICAL DATA:  Shortness of breath EXAM: PORTABLE CHEST 1 VIEW COMPARISON:  10/21/2021 FINDINGS: Prior CABG. Heart is normal size. No confluent opacities or effusions. Linear scarring at the left lung base. No acute bony abnormality. IMPRESSION: No active disease. Electronically Signed   By: Rolm Baptise M.D.   On: 03/16/2022 22:01  ? ?ECHOCARDIOGRAM COMPLETE ? ?Result Date: 03/18/2022 ?   ECHOCARDIOGRAM REPORT   Patient Name:   Robert Osborne Date of Exam: 03/18/2022 Medical Rec #:  [id number]        Height:       67.0 in Accession #:    [id number]      Weight:       155.0 lb Date of Birth:  [birth year]       BSA:          1.815 m? Patient Age:    59 years        BP:           117/69 mmHg Patient Gender: M               HR:           76 bpm. Exam Location:  [facility] Procedure: 2D Echo, Cardiac Doppler and Color Doppler Indications:    Elevated Troponin  History:        Patient has prior history of Echocardiogram examinations, most                 recent 08/23/2021. Previous Myocardial Infarction, Prior CABG,                 Stroke and COPD, Arrythmias:PVC; Risk Factors:Diabetes,  Hypertension and Dyslipidemia. Images by Lonn Georgia, studet.  Sonographer:    Wenda Low Referring Phys: [id number] RAVI PAHWANI IMPRESSIONS  1. Left ventricular ejection fraction, by estimation, is 55%. The left ventricle has normal function. Left ventricular endocardial border not optimally defined to evaluate regional wall motion. There is mild left ventricular hypertrophy. Left ventricular diastolic parameters are consistent with Grade I diastolic dysfunction (impaired relaxation).  2. Right ventricular systolic function is normal. The right ventricular size is normal. There is normal pulmonary artery systolic pressure.  3. The mitral valve is abnormal. Mild mitral valve regurgitation. No evidence of mitral stenosis.  4. The aortic valve has an indeterminant number of cusps. There is mild calcification of the aortic valve. There is mild thickening of the aortic valve. Aortic valve regurgitation is not visualized. No aortic stenosis is present.  5. Aortic dilatation noted. There is mild dilatation of the ascending aorta, measuring 36 mm.  6. The inferior vena cava is normal in size with greater than 50% respiratory variability, suggesting right atrial pressure of 3 mmHg. FINDINGS  Left Ventricle: Left ventricular ejection fraction, by estimation, is 55%. The left ventricle has normal function. Left ventricular endocardial  border not optimally defined to evaluate regional wall motion. The left ventricular internal cavity size was normal in size. There is mild left ventricular hypertrophy. Left ventricular diastolic parameters are consistent wi

## 2022-03-22 NOTE — TOC Transition Note (Addendum)
Transition of Care (TOC) - CM/SW Discharge Note ? ? ?Patient Details  ?Name: Robert Osborne ?MRN: [id number] ?Date of Birth: [birth year] ? ?Transition of Care (TOC) CM/SW Contact:  ?Kerin Salen, RN ?Phone Number: ?03/22/2022, 12:49 PM ? ? ?Clinical Narrative: Patient to discharge to [facility] room 136 via [facility]. Attending discussed with wife yesterday. Nurse to call report to [phone]. TOC barriers resolved.  ?4:09pm Attempted to fax discharge summary to [phone] twice unsuccessful, called the facility was informed by Aldona Bar that the fax machine was not working and to make sure discharge information is in patients packet. Nurse notified. ? ? ? ?Final next level of care: [place] ?Barriers to Discharge: Barriers Resolved ? ? ?Patient Goals and CMS Choice ?Patient states their goals for this hospitalization and ongoing recovery are:: return home ?  ?Choice offered to / list presented to : Patient ? ?Discharge Placement ?  ?           ?Patient chooses bed at: [facility] (Room 136.) ?Patient to be transferred to facility by: [facility] ?Name of family member notified: Attempted to call wife, no answer voice box not set up. ?  ? ?Discharge Plan and Services ?In-house Referral: Clinical Social Work ?  ?           ?  ?  ?  ?  ?  ?  ?  ?  ?  ?  ? ?Social Determinants of Health (SDOH) Interventions ?  ? ? ?Readmission Risk Interventions ? ?  03/17/2022  ? 11:21 AM 08/27/2021  ? 12:30 PM  ?Readmission Risk Prevention Plan  ?Transportation Screening Complete Complete  ?PCP or Specialist Appt within 3-5 Days  Complete  ?[place] or Home Care Consult  Complete  ?Social Work Consult for [place] Planning/Counseling  Complete  ?Palliative Care Screening  Not Applicable  ?Medication Review Press photographer) Complete Complete  ?[place] or Home Care Consult Complete   ?SW Recovery Care/Counseling Consult Complete   ?Palliative Care Screening Not Applicable   ?[place]  Not Applicable   ? ? ? ? ? ?

## 2022-03-22 NOTE — Progress Notes (Signed)
Nsg Discharge Note ? ?Admit Date:  03/16/2022 ?Discharge date: 03/22/2022 ?  ?Robert Osborne to be D/C'd Skilled nursing facility per MD order.  AVS completed.  Copy for chart, and copy for patient signed, and dated. ?Patient/caregiver able to verbalize understanding. ? ?Discharge Medication: ?Allergies as of 03/22/2022   ? ?   Reactions  ? Enalapril Cough  ? ?  ? ?  ?Medication List  ?  ? ?STOP taking these medications   ? ?insulin aspart 100 UNIT/ML FlexPen ?Commonly known as: NOVOLOG ?  ?levofloxacin 500 MG tablet ?Commonly known as: LEVAQUIN ?  ?oxyCODONE 5 MG immediate release tablet ?Commonly known as: Oxy IR/ROXICODONE ?  ? ?  ? ?TAKE these medications   ? ?(feeding supplement) PROSource Plus liquid ?Take 30 mLs by mouth 2 (two) times daily between meals. ?  ?aspirin 81 MG EC tablet ?Take 1 tablet (81 mg total) by mouth daily. Swallow whole. ?  ?blood glucose meter kit and supplies ?Dispense based on patient and insurance preference. Use up to four times daily as directed. (FOR ICD-10 E10.9, E11.9). ?  ?blood glucose meter kit and supplies ?Use up to four times daily as directed. dx E11.9 ?  ?blood glucose meter kit and supplies Kit ?Dispense based on patient and insurance preference. Use up to twice a daily as directed. (FOR ICD-10 - type 2 DM uncontrolled E11.9) ?  ?clopidogrel 75 MG tablet ?Commonly known as: PLAVIX ?Take 1 tablet (75 mg total) by mouth daily. ?  ?dapagliflozin propanediol 10 MG Tabs tablet ?Commonly known as: FARXIGA ?Take 1 tablet (10 mg total) by mouth daily. ?  ?ezetimibe 10 MG tablet ?Commonly known as: ZETIA ?Take 1 tablet (10 mg total) by mouth daily. ?  ?glucose blood test strip ?Use as instructed ?  ?isosorbide mononitrate 60 MG 24 hr tablet ?Commonly known as: IMDUR ?TAKE 1 TABLET BY MOUTH EVERY DAY ?  ?metoprolol tartrate 50 MG tablet ?Commonly known as: LOPRESSOR ?Take 1 tablet (50 mg total) by mouth 2 (two) times daily. ?  ?multivitamin with minerals Tabs tablet ?Take 1 tablet by  mouth daily. ?  ?nitroGLYCERIN 0.4 MG SL tablet ?Commonly known as: NITROSTAT ?Place 0.4 mg under the tongue every 5 (five) minutes as needed for chest pain. ?  ?OLANZapine 5 MG tablet ?Commonly known as: ZYPREXA ?Take 5 mg by mouth at bedtime. ?  ?ONE TOUCH ULTRA 2 w/Device Kit ?USE TO CHECK BLOOD SUGAR UP TO 4 TIMES DAILY AS DIRECTED ?  ?OneTouch Delica Plus [id number] Misc ?USE TO CHECK BLOOD SUGAR UP TO 4 TIMES A DAY AS DIRECTED ?  ?Pen Needles 32G X 4 MM Misc ?100 each by Does not apply route 4 (four) times daily. E10.9 ?  ?rosuvastatin 40 MG tablet ?Commonly known as: CRESTOR ?Take 1 tablet (40 mg total) by mouth daily. ?  ?tamsulosin 0.4 MG Caps capsule ?Commonly known as: FLOMAX ?Take 0.4 mg by mouth daily. ?  ?Tyler Aas FlexTouch 100 UNIT/ML FlexTouch Pen ?Generic drug: insulin degludec ?Inject 5 Units into the skin daily. ?What changed: how much to take ?  ? ?  ? ? ?Discharge Assessment: ?Vitals:  ? 03/22/22 0525 03/22/22 0900  ?BP: 140/72 137/74  ?Pulse: 69 78  ?Resp: 18   ?Temp: 98 ?F (36.7 ?C)   ?SpO2: 97%   ? Skin clean, dry and intact without evidence of skin break down, no evidence of skin tears noted. ?IV catheter discontinued intact. Site without signs and symptoms of complications - no redness or  edema noted at insertion site, patient denies c/o pain - only slight tenderness at site.  Dressing with slight pressure applied. ? ?D/c Instructions-Education: ?Discharge instructions given to patient/family with verbalized understanding. ?D/c education completed with patient/family including follow up instructions, medication list, d/c activities limitations if indicated, with other d/c instructions as indicated by MD - patient able to verbalize understanding, all questions fully answered. ?Patient instructed to return to ED, call 911, or call MD for any changes in condition.  ?Patient escorted via [place], and D/C home via private auto. ? ?Dorcas Mcmurray, RN ?03/22/2022 12:45 PM  ?

## 2022-03-22 NOTE — Progress Notes (Signed)
Patient being discharged to [facility] called and given to [employer]. EMS to transport patient to awaiting facility.Will continue to monitor patient. ?

## 2022-03-23 DIAGNOSIS — J189 Pneumonia, unspecified organism: Secondary | ICD-10-CM | POA: Diagnosis not present

## 2022-03-23 DIAGNOSIS — N179 Acute kidney failure, unspecified: Secondary | ICD-10-CM | POA: Diagnosis not present

## 2022-03-23 DIAGNOSIS — A419 Sepsis, unspecified organism: Secondary | ICD-10-CM | POA: Diagnosis not present

## 2022-03-23 DIAGNOSIS — R778 Other specified abnormalities of plasma proteins: Secondary | ICD-10-CM | POA: Diagnosis not present

## 2022-03-23 LAB — GLUCOSE, CAPILLARY
Glucose-Capillary: 134 mg/dL — ABNORMAL HIGH (ref 70–99)
Glucose-Capillary: 213 mg/dL — ABNORMAL HIGH (ref 70–99)
Glucose-Capillary: 136 mg/dL — ABNORMAL HIGH (ref 70–99)

## 2022-03-23 NOTE — Discharge Summary (Signed)
Physician Discharge Summary  ?Robert Osborne TMH:[id number] DOB: October 23, 1946 DOA: 03/16/2022 ? ?PCP: System, Provider Not In ? ?Admit date: 03/16/2022 ?Discharge date: 03/23/2022 ? ?Admitted From: Home ?Disposition: SNF ? ?Recommendations for Outpatient Follow-up:  ?Follow up with PCP in 1-2 weeks ?Please obtain BMP/CBC in one week ? ?Discharge Condition: Stable ?CODE STATUS: Full ?Diet recommendation: Low-salt low-fat diet ? ?Brief/Interim Summary: ?Robert Osborne is a 76 y.o. male with medical history significant of with history of bladder cancer, diabetes mellitus type 2, hyperlipidemia, hypertension, behavioral disturbance, and more presents to ED with a chief complaint of myalgias and dyspnea.  ? ?Patient admitted with sepsis secondary Communicare pneumonia versus aspiration pneumonia, symptoms improved quite drastically over the past 48 hours, unfortunately during patient's hospitalization he was noted to be profoundly weak with ongoing acute ambulatory dysfunction.  Patient had elevated troponin in the setting of pneumonia likely supply/demand mismatch.  Patient's other chronic comorbid conditions have stabilized, AKI improved with IV fluids, hypertension hyperlipidemia stable diabetes while poorly controlled outpatient has improved here on diabetic diet.  Patient otherwise stable and agreeable for discharge to SNF for ongoing physical therapy with ultimate plan to return home once stable and more independently ambulatory. ? ?*OF NOTE PATIENT DISCHARGED 03/22/22 - transport never arrived to pick up patient. Remains medically stable for discharge. ? ?Discharge Diagnoses:  ?Principal Problem: ?  Sepsis ([place]) ?Active Problems: ?  Hyperlipidemia associated with type 2 diabetes mellitus ([place]) ?  Hypertension associated with diabetes ([place]) ?  Hypoalbuminemia due to protein-calorie malnutrition ([place]) ?  AKI (acute kidney injury) ([place]) ?  Uncontrolled type 2 diabetes mellitus with hyperglycemia ([place]) ?  CAP (community  acquired pneumonia) ?  Elevated troponin ?  Non-productive cough ?  Leucocytosis ?  Shortness of breath ?  Thrombocytopenia ([place]) ? ? ? ?Discharge Instructions ? ?Discharge Instructions   ? ? Discharge patient   Complete by: As directed ?  ? Discharge disposition: 03-Skilled Nursing Facility  ? Discharge patient date: 03/22/2022  ? ?  ? ?Allergies as of 03/23/2022   ? ?   Reactions  ? Enalapril Cough  ? ?  ? ?  ?Medication List  ?  ? ?STOP taking these medications   ? ?insulin aspart 100 UNIT/ML FlexPen ?Commonly known as: NOVOLOG ?  ?levofloxacin 500 MG tablet ?Commonly known as: LEVAQUIN ?  ?oxyCODONE 5 MG immediate release tablet ?Commonly known as: Oxy IR/ROXICODONE ?  ? ?  ? ?TAKE these medications   ? ?(feeding supplement) PROSource Plus liquid ?Take 30 mLs by mouth 2 (two) times daily between meals. ?  ?aspirin 81 MG EC tablet ?Take 1 tablet (81 mg total) by mouth daily. Swallow whole. ?  ?blood glucose meter kit and supplies ?Dispense based on patient and insurance preference. Use up to four times daily as directed. (FOR ICD-10 E10.9, E11.9). ?  ?blood glucose meter kit and supplies ?Use up to four times daily as directed. dx E11.9 ?  ?blood glucose meter kit and supplies Kit ?Dispense based on patient and insurance preference. Use up to twice a daily as directed. (FOR ICD-10 - type 2 DM uncontrolled E11.9) ?  ?clopidogrel 75 MG tablet ?Commonly known as: PLAVIX ?Take 1 tablet (75 mg total) by mouth daily. ?  ?dapagliflozin propanediol 10 MG Tabs tablet ?Commonly known as: FARXIGA ?Take 1 tablet (10 mg total) by mouth daily. ?  ?ezetimibe 10 MG tablet ?Commonly known as: ZETIA ?Take 1 tablet (10 mg total) by mouth daily. ?  ?glucose blood test  strip ?Use as instructed ?  ?isosorbide mononitrate 60 MG 24 hr tablet ?Commonly known as: IMDUR ?TAKE 1 TABLET BY MOUTH EVERY DAY ?  ?metoprolol tartrate 50 MG tablet ?Commonly known as: LOPRESSOR ?Take 1 tablet (50 mg total) by mouth 2 (two) times daily. ?  ?multivitamin  with minerals Tabs tablet ?Take 1 tablet by mouth daily. ?  ?nitroGLYCERIN 0.4 MG SL tablet ?Commonly known as: NITROSTAT ?Place 0.4 mg under the tongue every 5 (five) minutes as needed for chest pain. ?  ?OLANZapine 5 MG tablet ?Commonly known as: ZYPREXA ?Take 5 mg by mouth at bedtime. ?  ?ONE TOUCH ULTRA 2 w/Device Kit ?USE TO CHECK BLOOD SUGAR UP TO 4 TIMES DAILY AS DIRECTED ?  ?OneTouch Delica Plus [id number] Misc ?USE TO CHECK BLOOD SUGAR UP TO 4 TIMES A DAY AS DIRECTED ?  ?Pen Needles 32G X 4 MM Misc ?100 each by Does not apply route 4 (four) times daily. E10.9 ?  ?rosuvastatin 40 MG tablet ?Commonly known as: CRESTOR ?Take 1 tablet (40 mg total) by mouth daily. ?  ?tamsulosin 0.4 MG Caps capsule ?Commonly known as: FLOMAX ?Take 0.4 mg by mouth daily. ?  ?Tyler Aas FlexTouch 100 UNIT/ML FlexTouch Pen ?Generic drug: insulin degludec ?Inject 5 Units into the skin daily. ?What changed: how much to take ?  ? ?  ? ? ?Allergies  ?Allergen Reactions  ? Enalapril Cough  ? ? ?Consultations: ?None ?Procedures/Studies: ?DG CHEST PORT 1 VIEW ? ?Result Date: 03/17/2022 ?CLINICAL DATA:  Sepsis EXAM: PORTABLE CHEST 1 VIEW COMPARISON:  03/16/2022 FINDINGS: Sternotomy wires overlie normal cardiac silhouette. Linear atelectasis in the LEFT lower lobe. Density posterior the RIGHT heart. No pulmonary edema no pneumothorax. IMPRESSION: Density posterior the RIGHT heart. Cannot exclude pneumonia, atelectasis, or nodule. Electronically Signed   By: Suzy Bouchard M.D.   On: 03/17/2022 08:14  ? ?DG Chest Portable 1 View ? ?Result Date: 03/16/2022 ?CLINICAL DATA:  Shortness of breath EXAM: PORTABLE CHEST 1 VIEW COMPARISON:  10/21/2021 FINDINGS: Prior CABG. Heart is normal size. No confluent opacities or effusions. Linear scarring at the left lung base. No acute bony abnormality. IMPRESSION: No active disease. Electronically Signed   By: Rolm Baptise M.D.   On: 03/16/2022 22:01  ? ?ECHOCARDIOGRAM COMPLETE ? ?Result Date: 03/18/2022 ?    ECHOCARDIOGRAM REPORT   Patient Name:   Robert Osborne Date of Exam: 03/18/2022 Medical Rec #:  [id number]       Height:       67.0 in Accession #:    [id number]      Weight:       155.0 lb Date of Birth:  14-May-1946       BSA:          1.815 m? Patient Age:    76 years        BP:           117/69 mmHg Patient Gender: M               HR:           76 bpm. Exam Location:  [facility] Procedure: 2D Echo, Cardiac Doppler and Color Doppler Indications:    Elevated Troponin  History:        Patient has prior history of Echocardiogram examinations, most                 recent 08/23/2021. Previous Myocardial Infarction, Prior CABG,  Stroke and COPD, Arrythmias:PVC; Risk Factors:Diabetes,                 Hypertension and Dyslipidemia. Images by Lonn Georgia, studet.  Sonographer:    Wenda Low Referring Phys: [id number] RAVI PAHWANI IMPRESSIONS  1. Left ventricular ejection fraction, by estimation, is 55%. The left ventricle has normal function. Left ventricular endocardial border not optimally defined to evaluate regional wall motion. There is mild left ventricular hypertrophy. Left ventricular diastolic parameters are consistent with Grade I diastolic dysfunction (impaired relaxation).  2. Right ventricular systolic function is normal. The right ventricular size is normal. There is normal pulmonary artery systolic pressure.  3. The mitral valve is abnormal. Mild mitral valve regurgitation. No evidence of mitral stenosis.  4. The aortic valve has an indeterminant number of cusps. There is mild calcification of the aortic valve. There is mild thickening of the aortic valve. Aortic valve regurgitation is not visualized. No aortic stenosis is present.  5. Aortic dilatation noted. There is mild dilatation of the ascending aorta, measuring 36 mm.  6. The inferior vena cava is normal in size with greater than 50% respiratory variability, suggesting right atrial pressure of 3 mmHg. FINDINGS  Left Ventricle: Left ventricular  ejection fraction, by estimation, is 55%. The left ventricle has normal function. Left ventricular endocardial border not optimally defined to evaluate regional wall motion. The left ventricular internal cavity

## 2022-03-23 NOTE — TOC Transition Note (Signed)
Transition of Care (TOC) - CM/SW Discharge Note ? ? ?Patient Details  ?Name: Robert Osborne ?MRN: [id number] ?Date of Birth: April 11, 1946 ? ?Transition of Care (TOC) CM/SW Contact:  ?Kerin Salen, RN ?Phone Number: ?03/23/2022, 11:10 AM ? ? ?Clinical Narrative: Due to transportation patient was unable to discharge yesterday. Scheduled to discharge today, called [facility] spoke with [place] who says she will inform the staff and room number remains 136. Med. Necessity paperwork revised.  ? ? ? ?Final next level of care: [place] ?Barriers to Discharge: Barriers Resolved ? ? ?Patient Goals and CMS Choice ?Patient states their goals for this hospitalization and ongoing recovery are:: return home ?  ?Choice offered to / list presented to : Patient ? ?Discharge Placement ?  ?           ?Patient chooses bed at: [facility] (Room 136.) ?Patient to be transferred to facility by: [facility] ?Name of family member notified: Attempted to call wife, no answer voice box not set up. ?  ? ?Discharge Plan and Services ?In-house Referral: Clinical Social Work ?  ?           ?  ?  ?  ?  ?  ?  ?  ?  ?  ?  ? ?Social Determinants of Health (SDOH) Interventions ?  ? ? ?Readmission Risk Interventions ? ?  03/17/2022  ? 11:21 AM 08/27/2021  ? 12:30 PM  ?Readmission Risk Prevention Plan  ?Transportation Screening Complete Complete  ?PCP or Specialist Appt within 3-5 Days  Complete  ?[place] or Home Care Consult  Complete  ?Social Work Consult for [place] Planning/Counseling  Complete  ?Palliative Care Screening  Not Applicable  ?Medication Review Press photographer) Complete Complete  ?[place] or Home Care Consult Complete   ?SW Recovery Care/Counseling Consult Complete   ?Palliative Care Screening Not Applicable   ?[place] Not Applicable   ? ? ? ? ? ?

## 2022-03-24 ENCOUNTER — Encounter (HOSPITAL_COMMUNITY): Payer: Medicare HMO

## 2022-03-24 DIAGNOSIS — J189 Pneumonia, unspecified organism: Secondary | ICD-10-CM | POA: Diagnosis not present

## 2022-03-24 DIAGNOSIS — R278 Other lack of coordination: Secondary | ICD-10-CM | POA: Diagnosis not present

## 2022-03-24 DIAGNOSIS — I1310 Hypertensive heart and chronic kidney disease without heart failure, with stage 1 through stage 4 chronic kidney disease, or unspecified chronic kidney disease: Secondary | ICD-10-CM | POA: Diagnosis not present

## 2022-03-24 DIAGNOSIS — A419 Sepsis, unspecified organism: Secondary | ICD-10-CM | POA: Diagnosis not present

## 2022-03-24 DIAGNOSIS — R69 Illness, unspecified: Secondary | ICD-10-CM | POA: Diagnosis not present

## 2022-03-24 DIAGNOSIS — D72829 Elevated white blood cell count, unspecified: Secondary | ICD-10-CM | POA: Diagnosis not present

## 2022-03-24 DIAGNOSIS — F02818 Dementia in other diseases classified elsewhere, unspecified severity, with other behavioral disturbance: Secondary | ICD-10-CM | POA: Diagnosis not present

## 2022-03-24 DIAGNOSIS — I129 Hypertensive chronic kidney disease with stage 1 through stage 4 chronic kidney disease, or unspecified chronic kidney disease: Secondary | ICD-10-CM | POA: Diagnosis not present

## 2022-03-24 DIAGNOSIS — M6281 Muscle weakness (generalized): Secondary | ICD-10-CM | POA: Diagnosis not present

## 2022-03-24 DIAGNOSIS — R6520 Severe sepsis without septic shock: Secondary | ICD-10-CM | POA: Diagnosis not present

## 2022-03-24 DIAGNOSIS — N182 Chronic kidney disease, stage 2 (mild): Secondary | ICD-10-CM | POA: Diagnosis not present

## 2022-03-24 DIAGNOSIS — E440 Moderate protein-calorie malnutrition: Secondary | ICD-10-CM | POA: Diagnosis not present

## 2022-03-24 DIAGNOSIS — I10 Essential (primary) hypertension: Secondary | ICD-10-CM | POA: Diagnosis not present

## 2022-03-24 DIAGNOSIS — N1832 Chronic kidney disease, stage 3b: Secondary | ICD-10-CM | POA: Diagnosis not present

## 2022-03-24 DIAGNOSIS — Z7984 Long term (current) use of oral hypoglycemic drugs: Secondary | ICD-10-CM | POA: Diagnosis not present

## 2022-03-24 DIAGNOSIS — Z794 Long term (current) use of insulin: Secondary | ICD-10-CM | POA: Diagnosis not present

## 2022-03-24 DIAGNOSIS — R488 Other symbolic dysfunctions: Secondary | ICD-10-CM | POA: Diagnosis not present

## 2022-03-24 DIAGNOSIS — R0602 Shortness of breath: Secondary | ICD-10-CM | POA: Diagnosis not present

## 2022-03-24 DIAGNOSIS — E785 Hyperlipidemia, unspecified: Secondary | ICD-10-CM | POA: Diagnosis not present

## 2022-03-24 DIAGNOSIS — Z8551 Personal history of malignant neoplasm of bladder: Secondary | ICD-10-CM | POA: Diagnosis not present

## 2022-03-24 DIAGNOSIS — E8809 Other disorders of plasma-protein metabolism, not elsewhere classified: Secondary | ICD-10-CM | POA: Diagnosis not present

## 2022-03-24 DIAGNOSIS — B351 Tinea unguium: Secondary | ICD-10-CM | POA: Diagnosis not present

## 2022-03-24 DIAGNOSIS — R269 Unspecified abnormalities of gait and mobility: Secondary | ICD-10-CM | POA: Diagnosis not present

## 2022-03-24 DIAGNOSIS — E1159 Type 2 diabetes mellitus with other circulatory complications: Secondary | ICD-10-CM | POA: Diagnosis not present

## 2022-03-24 DIAGNOSIS — N1831 Chronic kidney disease, stage 3a: Secondary | ICD-10-CM | POA: Diagnosis not present

## 2022-03-24 DIAGNOSIS — E559 Vitamin D deficiency, unspecified: Secondary | ICD-10-CM | POA: Diagnosis not present

## 2022-03-24 DIAGNOSIS — R945 Abnormal results of liver function studies: Secondary | ICD-10-CM | POA: Diagnosis not present

## 2022-03-24 DIAGNOSIS — R7309 Other abnormal glucose: Secondary | ICD-10-CM | POA: Diagnosis not present

## 2022-03-24 DIAGNOSIS — E1165 Type 2 diabetes mellitus with hyperglycemia: Secondary | ICD-10-CM | POA: Diagnosis not present

## 2022-03-24 DIAGNOSIS — E1122 Type 2 diabetes mellitus with diabetic chronic kidney disease: Secondary | ICD-10-CM | POA: Diagnosis not present

## 2022-03-24 DIAGNOSIS — E1169 Type 2 diabetes mellitus with other specified complication: Secondary | ICD-10-CM | POA: Diagnosis not present

## 2022-03-24 DIAGNOSIS — F3112 Bipolar disorder, current episode manic without psychotic features, moderate: Secondary | ICD-10-CM | POA: Diagnosis not present

## 2022-03-24 DIAGNOSIS — N179 Acute kidney failure, unspecified: Secondary | ICD-10-CM | POA: Diagnosis not present

## 2022-03-24 DIAGNOSIS — D696 Thrombocytopenia, unspecified: Secondary | ICD-10-CM | POA: Diagnosis not present

## 2022-03-24 DIAGNOSIS — I152 Hypertension secondary to endocrine disorders: Secondary | ICD-10-CM | POA: Diagnosis not present

## 2022-03-24 DIAGNOSIS — E119 Type 2 diabetes mellitus without complications: Secondary | ICD-10-CM | POA: Diagnosis not present

## 2022-03-24 DIAGNOSIS — R778 Other specified abnormalities of plasma proteins: Secondary | ICD-10-CM | POA: Diagnosis not present

## 2022-03-24 LAB — CBC
HCT: 37.1 % — ABNORMAL LOW (ref 39.0–52.0)
Hemoglobin: 11.7 g/dL — ABNORMAL LOW (ref 13.0–17.0)
MCH: 24.9 pg — ABNORMAL LOW (ref 26.0–34.0)
MCHC: 31.5 g/dL (ref 30.0–36.0)
MCV: 78.9 fL — ABNORMAL LOW (ref 80.0–100.0)
Platelets: 155 10*3/uL (ref 150–400)
RBC: 4.7 MIL/uL (ref 4.22–5.81)
RDW: 17 % — ABNORMAL HIGH (ref 11.5–15.5)
WBC: 6.4 10*3/uL (ref 4.0–10.5)
nRBC: 0 % (ref 0.0–0.2)

## 2022-03-24 LAB — BASIC METABOLIC PANEL
Anion gap: 7 (ref 5–15)
BUN: 30 mg/dL — ABNORMAL HIGH (ref 8–23)
CO2: 24 mmol/L (ref 22–32)
Calcium: 8.3 mg/dL — ABNORMAL LOW (ref 8.9–10.3)
Chloride: 106 mmol/L (ref 98–111)
Creatinine, Ser: 2 mg/dL — ABNORMAL HIGH (ref 0.61–1.24)
GFR, Estimated: 34 mL/min — ABNORMAL LOW (ref >60)
Glucose, Bld: 146 mg/dL — ABNORMAL HIGH (ref 70–99)
Potassium: 3.7 mmol/L (ref 3.5–5.1)
Sodium: 137 mmol/L (ref 135–145)

## 2022-03-24 LAB — GLUCOSE, CAPILLARY: Glucose-Capillary: 135 mg/dL — ABNORMAL HIGH (ref 70–99)

## 2022-03-24 NOTE — Discharge Summary (Signed)
Physician Discharge Summary  ?[name] SWF:[id number] DOB: [birth year] DOA: 03/16/2022 ? ?PCP: System, Provider Not In ? ?Admit date: 03/16/2022 ?Discharge date: 03/24/2022 ? ?Admitted From: Home ?Disposition: SNF ? ?Recommendations for Outpatient Follow-up:  ?Follow up with PCP in 1-2 weeks ?Please obtain BMP/CBC in one week ? ?Discharge Condition: Stable ?CODE STATUS: Full ?Diet recommendation: Low-salt low-fat diet ? ?Brief/Interim Summary: ?Robert Osborne is a 76 y.o. male with medical history significant of with history of bladder cancer, diabetes mellitus type 2, hyperlipidemia, hypertension, behavioral disturbance, and more presents to ED with a chief complaint of myalgias and dyspnea.  ? ?Patient admitted with sepsis secondary Communicare pneumonia versus aspiration pneumonia, symptoms improved quite drastically over the past 48 hours, unfortunately during patient's hospitalization he was noted to be profoundly weak with ongoing acute ambulatory dysfunction.  Patient had elevated troponin in the setting of pneumonia likely supply/demand mismatch.  Patient's other chronic comorbid conditions have stabilized, AKI improved with IV fluids, hypertension hyperlipidemia stable diabetes while poorly controlled outpatient has improved here on diabetic diet.  Patient otherwise stable and agreeable for discharge to SNF for ongoing physical therapy with ultimate plan to return home once stable and more independently ambulatory. ? ?*OF NOTE PATIENT DISCHARGED 03/22/22 - transport never arrived to pick up patient. Remains medically stable for discharge. ? ?Discharge Diagnoses:  ?Principal Problem: ?  Sepsis ([place]) ?Active Problems: ?  Hyperlipidemia associated with type 2 diabetes mellitus ([place]) ?  Hypertension associated with diabetes ([place]) ?  Hypoalbuminemia due to protein-calorie malnutrition ([place]) ?  AKI (acute kidney injury) ([place]) ?  Uncontrolled type 2 diabetes mellitus with hyperglycemia ([place]) ?  CAP (community  acquired pneumonia) ?  Elevated troponin ?  Non-productive cough ?  Leucocytosis ?  Shortness of breath ?  Thrombocytopenia ([place]) ? ? ? ?Discharge Instructions ? ?Discharge Instructions   ? ? Discharge patient   Complete by: As directed ?  ? Discharge disposition: 03-Skilled Nursing Facility  ? Discharge patient date: 03/22/2022  ? ?  ? ?Allergies as of 03/24/2022   ? ?   Reactions  ? Enalapril Cough  ? ?  ? ?  ?Medication List  ?  ? ?STOP taking these medications   ? ?insulin aspart 100 UNIT/ML FlexPen ?Commonly known as: NOVOLOG ?  ?levofloxacin 500 MG tablet ?Commonly known as: LEVAQUIN ?  ?oxyCODONE 5 MG immediate release tablet ?Commonly known as: Oxy IR/ROXICODONE ?  ? ?  ? ?TAKE these medications   ? ?(feeding supplement) PROSource Plus liquid ?Take 30 mLs by mouth 2 (two) times daily between meals. ?  ?aspirin 81 MG EC tablet ?Take 1 tablet (81 mg total) by mouth daily. Swallow whole. ?  ?blood glucose meter kit and supplies ?Dispense based on patient and insurance preference. Use up to four times daily as directed. (FOR ICD-10 E10.9, E11.9). ?  ?blood glucose meter kit and supplies ?Use up to four times daily as directed. dx E11.9 ?  ?blood glucose meter kit and supplies Kit ?Dispense based on patient and insurance preference. Use up to twice a daily as directed. (FOR ICD-10 - type 2 DM uncontrolled E11.9) ?  ?clopidogrel 75 MG tablet ?Commonly known as: PLAVIX ?Take 1 tablet (75 mg total) by mouth daily. ?  ?dapagliflozin propanediol 10 MG Tabs tablet ?Commonly known as: FARXIGA ?Take 1 tablet (10 mg total) by mouth daily. ?  ?ezetimibe 10 MG tablet ?Commonly known as: ZETIA ?Take 1 tablet (10 mg total) by mouth daily. ?  ?glucose blood test  strip ?Use as instructed ?  ?isosorbide mononitrate 60 MG 24 hr tablet ?Commonly known as: IMDUR ?TAKE 1 TABLET BY MOUTH EVERY DAY ?  ?metoprolol tartrate 50 MG tablet ?Commonly known as: LOPRESSOR ?Take 1 tablet (50 mg total) by mouth 2 (two) times daily. ?   ?multivitamin with minerals Tabs tablet ?Take 1 tablet by mouth daily. ?  ?nitroGLYCERIN 0.4 MG SL tablet ?Commonly known as: NITROSTAT ?Place 0.4 mg under the tongue every 5 (five) minutes as needed for chest pain. ?  ?OLANZapine 5 MG tablet ?Commonly known as: ZYPREXA ?Take 5 mg by mouth at bedtime. ?  ?ONE TOUCH ULTRA 2 w/Device Kit ?USE TO CHECK BLOOD SUGAR UP TO 4 TIMES DAILY AS DIRECTED ?  ?OneTouch Delica Plus [id number] Misc ?USE TO CHECK BLOOD SUGAR UP TO 4 TIMES A DAY AS DIRECTED ?  ?Pen Needles 32G X 4 MM Misc ?100 each by Does not apply route 4 (four) times daily. E10.9 ?  ?rosuvastatin 40 MG tablet ?Commonly known as: CRESTOR ?Take 1 tablet (40 mg total) by mouth daily. ?  ?tamsulosin 0.4 MG Caps capsule ?Commonly known as: FLOMAX ?Take 0.4 mg by mouth daily. ?  ?Tyler Aas FlexTouch 100 UNIT/ML FlexTouch Pen ?Generic drug: insulin degludec ?Inject 5 Units into the skin daily. ?What changed: how much to take ?  ? ?  ? ? ?Allergies  ?Allergen Reactions  ? Enalapril Cough  ? ? ?Consultations: ?None ?Procedures/Studies: ?DG CHEST PORT 1 VIEW ? ?Result Date: 03/17/2022 ?CLINICAL DATA:  Sepsis EXAM: PORTABLE CHEST 1 VIEW COMPARISON:  03/16/2022 FINDINGS: Sternotomy wires overlie normal cardiac silhouette. Linear atelectasis in the LEFT lower lobe. Density posterior the RIGHT heart. No pulmonary edema no pneumothorax. IMPRESSION: Density posterior the RIGHT heart. Cannot exclude pneumonia, atelectasis, or nodule. Electronically Signed   By: Suzy Bouchard M.D.   On: 03/17/2022 08:14  ? ?DG Chest Portable 1 View ? ?Result Date: 03/16/2022 ?CLINICAL DATA:  Shortness of breath EXAM: PORTABLE CHEST 1 VIEW COMPARISON:  10/21/2021 FINDINGS: Prior CABG. Heart is normal size. No confluent opacities or effusions. Linear scarring at the left lung base. No acute bony abnormality. IMPRESSION: No active disease. Electronically Signed   By: Rolm Baptise M.D.   On: 03/16/2022 22:01  ? ?ECHOCARDIOGRAM COMPLETE ? ?Result Date:  03/18/2022 ?   ECHOCARDIOGRAM REPORT   Patient Name:   Robert Osborne Date of Exam: 03/18/2022 Medical Rec #:  [id number]       Height:       67.0 in Accession #:    [id number]      Weight:       155.0 lb Date of Birth:  Mar 30, 1946       BSA:          1.815 m? Patient Age:    12 years        BP:           117/69 mmHg Patient Gender: M               HR:           76 bpm. Exam Location:  [facility] Procedure: 2D Echo, Cardiac Doppler and Color Doppler Indications:    Elevated Troponin  History:        Patient has prior history of Echocardiogram examinations, most                 recent 08/23/2021. Previous Myocardial Infarction, Prior CABG,  Stroke and COPD, Arrythmias:PVC; Risk Factors:Diabetes,                 Hypertension and Dyslipidemia. Images by Lonn Georgia, studet.  Sonographer:    Wenda Low Referring Phys: [id number] RAVI PAHWANI IMPRESSIONS  1. Left ventricular ejection fraction, by estimation, is 55%. The left ventricle has normal function. Left ventricular endocardial border not optimally defined to evaluate regional wall motion. There is mild left ventricular hypertrophy. Left ventricular diastolic parameters are consistent with Grade I diastolic dysfunction (impaired relaxation).  2. Right ventricular systolic function is normal. The right ventricular size is normal. There is normal pulmonary artery systolic pressure.  3. The mitral valve is abnormal. Mild mitral valve regurgitation. No evidence of mitral stenosis.  4. The aortic valve has an indeterminant number of cusps. There is mild calcification of the aortic valve. There is mild thickening of the aortic valve. Aortic valve regurgitation is not visualized. No aortic stenosis is present.  5. Aortic dilatation noted. There is mild dilatation of the ascending aorta, measuring 36 mm.  6. The inferior vena cava is normal in size with greater than 50% respiratory variability, suggesting right atrial pressure of 3 mmHg. FINDINGS  Left Ventricle: Left  ventricular ejection fraction, by estimation, is 55%. The left ventricle has normal function. Left ventricular endocardial border not optimally defined to evaluate regional wall motion. The left ventricular internal cavit

## 2022-03-24 NOTE — TOC Transition Note (Signed)
Transition of Care (TOC) - CM/SW Discharge Note ? ? ?Patient Details  ?Name: Robert Osborne ?MRN: [id number] ?Date of Birth: 10/12/1946 ? ?Transition of Care (TOC) CM/SW Contact:  ?Iona Beard, LCSWA ?Phone Number: ?03/24/2022, 11:23 AM ? ? ?Clinical Narrative:    ?CSW aware that pts insurance auth has not expired. Pt will need new PT note for auth to be restarted by facility.  ? ?CSW alerted that pt was picked up by EMS. CSW reached out to [place] in admissions at SNF who states pt is in the building and they will figure things out. They will not need to have pt return to the hospital. CSW attempting to make family aware of D/C. TOC signing off.  ? ?Final next level of care: [place] ?Barriers to Discharge: Barriers Resolved ? ? ?Patient Goals and CMS Choice ?Patient states their goals for this hospitalization and ongoing recovery are:: return home ?  ?Choice offered to / list presented to : Patient ? ?Discharge Placement ?  ?           ?Patient [facility] bed at: [facility] (Room 136.) ?Patient to be transferred to facility by: [facility] ?Name of family member notified: Attempted to call wife, no answer voice box not set up. ?  ? ?Discharge Plan and Services ?In-house Referral: Clinical Social Work ?  ?           ?  ?  ?  ?  ?  ?  ?  ?  ?  ?  ? ?Social Determinants of Health (SDOH) Interventions ?  ? ? ?Readmission Risk Interventions ? ?  03/17/2022  ? 11:21 AM 08/27/2021  ? 12:30 PM  ?Readmission Risk Prevention Plan  ?Transportation Screening Complete Complete  ?PCP or Specialist Appt within 3-5 Days  Complete  ?[place] or Home Care Consult  Complete  ?Social Work Consult for Gerty Planning/Counseling  Complete  ?Palliative Care Screening  Not Applicable  ?Medication Review Press photographer) Complete Complete  ?[place] or Home Care Consult Complete   ?SW Recovery Care/Counseling Consult Complete   ?Palliative Care Screening Not Applicable   ?[place] Not  Applicable   ? ? ? ? ? ?

## 2022-03-24 NOTE — Plan of Care (Signed)

## 2022-03-24 NOTE — Care Management Important Message (Signed)
Important Message ? ?Patient Details  ?Name: Robert Osborne ?MRN: [id number] ?Date of Birth: 1946/09/28 ? ? ?Medicare Important Message Given:  No ? ?Patient discharged prior to attempt to review Medicare IM.  Last reviewed with patient on 03/21/22 via room phone. ? ? ?Dannette Barbara ?03/24/2022, 9:55 AM ?

## 2022-03-25 DIAGNOSIS — Z794 Long term (current) use of insulin: Secondary | ICD-10-CM | POA: Diagnosis not present

## 2022-03-25 DIAGNOSIS — J189 Pneumonia, unspecified organism: Secondary | ICD-10-CM | POA: Diagnosis not present

## 2022-03-25 DIAGNOSIS — D696 Thrombocytopenia, unspecified: Secondary | ICD-10-CM | POA: Diagnosis not present

## 2022-03-25 DIAGNOSIS — Z7984 Long term (current) use of oral hypoglycemic drugs: Secondary | ICD-10-CM | POA: Diagnosis not present

## 2022-03-25 DIAGNOSIS — N1831 Chronic kidney disease, stage 3a: Secondary | ICD-10-CM | POA: Diagnosis not present

## 2022-03-25 DIAGNOSIS — M6281 Muscle weakness (generalized): Secondary | ICD-10-CM | POA: Diagnosis not present

## 2022-03-25 DIAGNOSIS — E785 Hyperlipidemia, unspecified: Secondary | ICD-10-CM | POA: Diagnosis not present

## 2022-03-25 DIAGNOSIS — E8809 Other disorders of plasma-protein metabolism, not elsewhere classified: Secondary | ICD-10-CM | POA: Diagnosis not present

## 2022-03-25 DIAGNOSIS — E1122 Type 2 diabetes mellitus with diabetic chronic kidney disease: Secondary | ICD-10-CM | POA: Diagnosis not present

## 2022-03-25 DIAGNOSIS — I1310 Hypertensive heart and chronic kidney disease without heart failure, with stage 1 through stage 4 chronic kidney disease, or unspecified chronic kidney disease: Secondary | ICD-10-CM | POA: Diagnosis not present

## 2022-03-27 DIAGNOSIS — I129 Hypertensive chronic kidney disease with stage 1 through stage 4 chronic kidney disease, or unspecified chronic kidney disease: Secondary | ICD-10-CM | POA: Diagnosis not present

## 2022-03-27 DIAGNOSIS — E785 Hyperlipidemia, unspecified: Secondary | ICD-10-CM | POA: Diagnosis not present

## 2022-03-27 DIAGNOSIS — R945 Abnormal results of liver function studies: Secondary | ICD-10-CM | POA: Diagnosis not present

## 2022-03-27 DIAGNOSIS — Z7984 Long term (current) use of oral hypoglycemic drugs: Secondary | ICD-10-CM | POA: Diagnosis not present

## 2022-03-27 DIAGNOSIS — J189 Pneumonia, unspecified organism: Secondary | ICD-10-CM | POA: Diagnosis not present

## 2022-03-27 DIAGNOSIS — E1122 Type 2 diabetes mellitus with diabetic chronic kidney disease: Secondary | ICD-10-CM | POA: Diagnosis not present

## 2022-03-27 DIAGNOSIS — M6281 Muscle weakness (generalized): Secondary | ICD-10-CM | POA: Diagnosis not present

## 2022-03-27 DIAGNOSIS — D696 Thrombocytopenia, unspecified: Secondary | ICD-10-CM | POA: Diagnosis not present

## 2022-03-27 DIAGNOSIS — Z794 Long term (current) use of insulin: Secondary | ICD-10-CM | POA: Diagnosis not present

## 2022-03-27 DIAGNOSIS — E8809 Other disorders of plasma-protein metabolism, not elsewhere classified: Secondary | ICD-10-CM | POA: Diagnosis not present

## 2022-03-27 DIAGNOSIS — N1832 Chronic kidney disease, stage 3b: Secondary | ICD-10-CM | POA: Diagnosis not present

## 2022-03-28 ENCOUNTER — Encounter (HOSPITAL_COMMUNITY): Payer: Medicare HMO

## 2022-03-31 ENCOUNTER — Encounter (HOSPITAL_COMMUNITY): Payer: Medicare HMO

## 2022-04-01 DIAGNOSIS — N1832 Chronic kidney disease, stage 3b: Secondary | ICD-10-CM | POA: Diagnosis not present

## 2022-04-01 DIAGNOSIS — I129 Hypertensive chronic kidney disease with stage 1 through stage 4 chronic kidney disease, or unspecified chronic kidney disease: Secondary | ICD-10-CM | POA: Diagnosis not present

## 2022-04-01 DIAGNOSIS — J189 Pneumonia, unspecified organism: Secondary | ICD-10-CM | POA: Diagnosis not present

## 2022-04-01 DIAGNOSIS — E785 Hyperlipidemia, unspecified: Secondary | ICD-10-CM | POA: Diagnosis not present

## 2022-04-01 DIAGNOSIS — I10 Essential (primary) hypertension: Secondary | ICD-10-CM | POA: Diagnosis not present

## 2022-04-01 DIAGNOSIS — M6281 Muscle weakness (generalized): Secondary | ICD-10-CM | POA: Diagnosis not present

## 2022-04-02 DIAGNOSIS — N1832 Chronic kidney disease, stage 3b: Secondary | ICD-10-CM | POA: Diagnosis not present

## 2022-04-02 DIAGNOSIS — M6281 Muscle weakness (generalized): Secondary | ICD-10-CM | POA: Diagnosis not present

## 2022-04-02 DIAGNOSIS — D696 Thrombocytopenia, unspecified: Secondary | ICD-10-CM | POA: Diagnosis not present

## 2022-04-02 DIAGNOSIS — E785 Hyperlipidemia, unspecified: Secondary | ICD-10-CM | POA: Diagnosis not present

## 2022-04-02 DIAGNOSIS — R945 Abnormal results of liver function studies: Secondary | ICD-10-CM | POA: Diagnosis not present

## 2022-04-02 DIAGNOSIS — Z8551 Personal history of malignant neoplasm of bladder: Secondary | ICD-10-CM | POA: Diagnosis not present

## 2022-04-02 DIAGNOSIS — J189 Pneumonia, unspecified organism: Secondary | ICD-10-CM | POA: Diagnosis not present

## 2022-04-02 DIAGNOSIS — Z794 Long term (current) use of insulin: Secondary | ICD-10-CM | POA: Diagnosis not present

## 2022-04-02 DIAGNOSIS — R69 Illness, unspecified: Secondary | ICD-10-CM | POA: Diagnosis not present

## 2022-04-02 DIAGNOSIS — E1122 Type 2 diabetes mellitus with diabetic chronic kidney disease: Secondary | ICD-10-CM | POA: Diagnosis not present

## 2022-04-02 DIAGNOSIS — N182 Chronic kidney disease, stage 2 (mild): Secondary | ICD-10-CM | POA: Diagnosis not present

## 2022-04-02 DIAGNOSIS — I129 Hypertensive chronic kidney disease with stage 1 through stage 4 chronic kidney disease, or unspecified chronic kidney disease: Secondary | ICD-10-CM | POA: Diagnosis not present

## 2022-04-03 DIAGNOSIS — E1122 Type 2 diabetes mellitus with diabetic chronic kidney disease: Secondary | ICD-10-CM | POA: Diagnosis not present

## 2022-04-03 DIAGNOSIS — I129 Hypertensive chronic kidney disease with stage 1 through stage 4 chronic kidney disease, or unspecified chronic kidney disease: Secondary | ICD-10-CM | POA: Diagnosis not present

## 2022-04-03 DIAGNOSIS — N182 Chronic kidney disease, stage 2 (mild): Secondary | ICD-10-CM | POA: Diagnosis not present

## 2022-04-03 DIAGNOSIS — Z7984 Long term (current) use of oral hypoglycemic drugs: Secondary | ICD-10-CM | POA: Diagnosis not present

## 2022-04-03 DIAGNOSIS — E119 Type 2 diabetes mellitus without complications: Secondary | ICD-10-CM | POA: Diagnosis not present

## 2022-04-03 DIAGNOSIS — E785 Hyperlipidemia, unspecified: Secondary | ICD-10-CM | POA: Diagnosis not present

## 2022-04-03 DIAGNOSIS — Z794 Long term (current) use of insulin: Secondary | ICD-10-CM | POA: Diagnosis not present

## 2022-04-03 DIAGNOSIS — J189 Pneumonia, unspecified organism: Secondary | ICD-10-CM | POA: Diagnosis not present

## 2022-04-03 DIAGNOSIS — M6281 Muscle weakness (generalized): Secondary | ICD-10-CM | POA: Diagnosis not present

## 2022-04-04 ENCOUNTER — Encounter (HOSPITAL_COMMUNITY): Payer: Medicare HMO

## 2022-04-04 DIAGNOSIS — B351 Tinea unguium: Secondary | ICD-10-CM | POA: Diagnosis not present

## 2022-04-04 DIAGNOSIS — E1159 Type 2 diabetes mellitus with other circulatory complications: Secondary | ICD-10-CM | POA: Diagnosis not present

## 2022-04-07 ENCOUNTER — Encounter (HOSPITAL_COMMUNITY): Payer: Medicare HMO

## 2022-04-11 ENCOUNTER — Encounter (HOSPITAL_COMMUNITY): Payer: Medicare HMO

## 2022-04-14 ENCOUNTER — Encounter (HOSPITAL_COMMUNITY): Payer: Medicare HMO

## 2022-04-16 DIAGNOSIS — Z794 Long term (current) use of insulin: Secondary | ICD-10-CM | POA: Diagnosis not present

## 2022-04-16 DIAGNOSIS — I10 Essential (primary) hypertension: Secondary | ICD-10-CM | POA: Diagnosis not present

## 2022-04-16 DIAGNOSIS — E1122 Type 2 diabetes mellitus with diabetic chronic kidney disease: Secondary | ICD-10-CM | POA: Diagnosis not present

## 2022-04-16 DIAGNOSIS — C679 Malignant neoplasm of bladder, unspecified: Secondary | ICD-10-CM | POA: Diagnosis not present

## 2022-04-16 DIAGNOSIS — R69 Illness, unspecified: Secondary | ICD-10-CM | POA: Diagnosis not present

## 2022-04-16 DIAGNOSIS — E46 Unspecified protein-calorie malnutrition: Secondary | ICD-10-CM | POA: Diagnosis not present

## 2022-04-16 DIAGNOSIS — G3109 Other frontotemporal dementia: Secondary | ICD-10-CM | POA: Diagnosis not present

## 2022-04-16 DIAGNOSIS — Z8619 Personal history of other infectious and parasitic diseases: Secondary | ICD-10-CM | POA: Diagnosis not present

## 2022-04-16 DIAGNOSIS — E8809 Other disorders of plasma-protein metabolism, not elsewhere classified: Secondary | ICD-10-CM | POA: Diagnosis not present

## 2022-04-16 DIAGNOSIS — D696 Thrombocytopenia, unspecified: Secondary | ICD-10-CM | POA: Diagnosis not present

## 2022-04-16 DIAGNOSIS — J189 Pneumonia, unspecified organism: Secondary | ICD-10-CM | POA: Diagnosis not present

## 2022-05-03 DIAGNOSIS — E119 Type 2 diabetes mellitus without complications: Secondary | ICD-10-CM | POA: Diagnosis not present

## 2022-05-20 DIAGNOSIS — E119 Type 2 diabetes mellitus without complications: Secondary | ICD-10-CM | POA: Diagnosis not present

## 2022-05-20 DIAGNOSIS — E7800 Pure hypercholesterolemia, unspecified: Secondary | ICD-10-CM | POA: Diagnosis not present

## 2022-05-20 DIAGNOSIS — H5200 Hypermetropia, unspecified eye: Secondary | ICD-10-CM | POA: Diagnosis not present

## 2022-05-23 DIAGNOSIS — E119 Type 2 diabetes mellitus without complications: Secondary | ICD-10-CM | POA: Diagnosis not present

## 2022-06-06 DIAGNOSIS — E785 Hyperlipidemia, unspecified: Secondary | ICD-10-CM | POA: Diagnosis not present

## 2022-06-06 DIAGNOSIS — I10 Essential (primary) hypertension: Secondary | ICD-10-CM | POA: Diagnosis not present

## 2022-06-06 DIAGNOSIS — I152 Hypertension secondary to endocrine disorders: Secondary | ICD-10-CM | POA: Diagnosis not present

## 2022-06-06 DIAGNOSIS — E1169 Type 2 diabetes mellitus with other specified complication: Secondary | ICD-10-CM | POA: Diagnosis not present

## 2022-06-06 DIAGNOSIS — M545 Low back pain, unspecified: Secondary | ICD-10-CM | POA: Diagnosis not present

## 2022-06-06 DIAGNOSIS — E1159 Type 2 diabetes mellitus with other circulatory complications: Secondary | ICD-10-CM | POA: Diagnosis not present

## 2022-06-06 DIAGNOSIS — W19XXXS Unspecified fall, sequela: Secondary | ICD-10-CM | POA: Diagnosis not present

## 2022-06-10 ENCOUNTER — Other Ambulatory Visit: Payer: Self-pay

## 2022-06-10 ENCOUNTER — Encounter (HOSPITAL_COMMUNITY): Payer: Self-pay | Admitting: *Deleted

## 2022-06-10 ENCOUNTER — Emergency Department (HOSPITAL_COMMUNITY)
Admission: EM | Admit: 2022-06-10 | Discharge: 2022-06-10 | Disposition: A | Discharge status: Home / Self Care | Payer: Medicare HMO | Attending: Emergency Medicine | Admitting: Emergency Medicine

## 2022-06-10 ENCOUNTER — Emergency Department (HOSPITAL_COMMUNITY): Payer: Medicare HMO

## 2022-06-10 DIAGNOSIS — S92425A Nondisplaced fracture of distal phalanx of left great toe, initial encounter for closed fracture: Secondary | ICD-10-CM | POA: Diagnosis not present

## 2022-06-10 DIAGNOSIS — Z79899 Other long term (current) drug therapy: Secondary | ICD-10-CM | POA: Insufficient documentation

## 2022-06-10 DIAGNOSIS — S99922A Unspecified injury of left foot, initial encounter: Secondary | ICD-10-CM | POA: Diagnosis not present

## 2022-06-10 DIAGNOSIS — I10 Essential (primary) hypertension: Secondary | ICD-10-CM | POA: Diagnosis not present

## 2022-06-10 DIAGNOSIS — Z794 Long term (current) use of insulin: Secondary | ICD-10-CM | POA: Insufficient documentation

## 2022-06-10 DIAGNOSIS — Z853 Personal history of malignant neoplasm of breast: Secondary | ICD-10-CM | POA: Diagnosis not present

## 2022-06-10 DIAGNOSIS — R9431 Abnormal electrocardiogram [ECG] [EKG]: Secondary | ICD-10-CM | POA: Diagnosis not present

## 2022-06-10 DIAGNOSIS — E119 Type 2 diabetes mellitus without complications: Secondary | ICD-10-CM | POA: Insufficient documentation

## 2022-06-10 DIAGNOSIS — W19XXXA Unspecified fall, initial encounter: Secondary | ICD-10-CM

## 2022-06-10 DIAGNOSIS — M549 Dorsalgia, unspecified: Secondary | ICD-10-CM | POA: Diagnosis not present

## 2022-06-10 DIAGNOSIS — W010XXA Fall on same level from slipping, tripping and stumbling without subsequent striking against object, initial encounter: Secondary | ICD-10-CM | POA: Insufficient documentation

## 2022-06-10 DIAGNOSIS — Z7901 Long term (current) use of anticoagulants: Secondary | ICD-10-CM | POA: Insufficient documentation

## 2022-06-10 DIAGNOSIS — M545 Low back pain, unspecified: Secondary | ICD-10-CM | POA: Diagnosis not present

## 2022-06-10 DIAGNOSIS — S92422A Displaced fracture of distal phalanx of left great toe, initial encounter for closed fracture: Secondary | ICD-10-CM | POA: Diagnosis not present

## 2022-06-10 LAB — CBC WITH DIFFERENTIAL/PLATELET
Abs Immature Granulocytes: 0.01 10*3/uL (ref 0.00–0.07)
Basophils Absolute: 0 10*3/uL (ref 0.0–0.1)
Basophils Relative: 0 %
Eosinophils Absolute: 0 10*3/uL (ref 0.0–0.5)
Eosinophils Relative: 0 %
HCT: 46.9 % (ref 39.0–52.0)
Hemoglobin: 14.2 g/dL (ref 13.0–17.0)
Immature Granulocytes: 0 %
Lymphocytes Relative: 20 %
Lymphs Abs: 1.1 10*3/uL (ref 0.7–4.0)
MCH: 25.6 pg — ABNORMAL LOW (ref 26.0–34.0)
MCHC: 30.3 g/dL (ref 30.0–36.0)
MCV: 84.7 fL (ref 80.0–100.0)
Monocytes Absolute: 0.8 10*3/uL (ref 0.1–1.0)
Monocytes Relative: 16 %
Neutro Abs: 3.4 10*3/uL (ref 1.7–7.7)
Neutrophils Relative %: 64 %
Platelets: 176 10*3/uL (ref 150–400)
RBC: 5.54 MIL/uL (ref 4.22–5.81)
RDW: 17.2 % — ABNORMAL HIGH (ref 11.5–15.5)
WBC: 5.3 10*3/uL (ref 4.0–10.5)
nRBC: 0 % (ref 0.0–0.2)

## 2022-06-10 LAB — BASIC METABOLIC PANEL
Anion gap: 12 (ref 5–15)
BUN: 30 mg/dL — ABNORMAL HIGH (ref 8–23)
CO2: 21 mmol/L — ABNORMAL LOW (ref 22–32)
Calcium: 8.9 mg/dL (ref 8.9–10.3)
Chloride: 106 mmol/L (ref 98–111)
Creatinine, Ser: 1.52 mg/dL — ABNORMAL HIGH (ref 0.61–1.24)
GFR, Estimated: 47 mL/min — ABNORMAL LOW (ref >60)
Glucose, Bld: 88 mg/dL (ref 70–99)
Potassium: 4.3 mmol/L (ref 3.5–5.1)
Sodium: 139 mmol/L (ref 135–145)

## 2022-06-10 LAB — URINALYSIS, ROUTINE W REFLEX MICROSCOPIC
Bilirubin Urine: NEGATIVE
Glucose, UA: >=500 mg/dL — AB
Hgb urine dipstick: NEGATIVE
Ketones, ur: 20 mg/dL — AB
Leukocytes,Ua: NEGATIVE
Nitrite: NEGATIVE
Protein, ur: 30 mg/dL — AB
Specific Gravity, Urine: 1.027 (ref 1.005–1.030)
pH: 5 (ref 5.0–8.0)

## 2022-06-10 NOTE — ED Provider Notes (Signed)
[facility] EMERGENCY DEPARTMENT Provider Note   CSN: [id number] Arrival date & time: 06/10/22  2800     History  Chief Complaint  Patient presents with   [name]    Robert Osborne is a 76 y.o. male.   Fall Pertinent negatives include no chest pain, no abdominal pain, no headaches and no shortness of breath.       Robert Osborne is a 76 y.o. male with past medical history of type 2 diabetes, hyperlipidemia, hypertension and bladder cancer who presents to the Emergency Department complaining of injury sustained in a fall that occurred earlier today.  He was recently undergoing physical therapy for lower extremity weakness secondary to CABG procedure.  He underwent physical therapy, but was discontinued at patient's discretion.  He was admitted to the hospital in April for pneumonia.  Since his pneumonia diagnosis, he states that his lower extremities are weak, he is having to use a walker at baseline.  He notes having a mechanical fall 4 days ago and then fell again yesterday.  He notes an injury of his mid and lower back on Friday.  He was seen by PCP and x-rays were ordered.  Yesterday, he notes tripping over a brick and injuring his lower back and left great toe.  He denies any head injury or LOC.  He also denies any abdominal pain, chest pain or shortness of breath, pain or numbness of his legs, urine or bowel changes.  He has throbbing pain of his left great toe.   Home Medications Prior to Admission medications   Medication Sig Start Date End Date Taking? Authorizing Provider  aspirin EC 81 MG EC tablet Take 1 tablet (81 mg total) by mouth daily. Swallow whole. 08/30/21  Yes Barrett, Erin R, PA-C  clopidogrel (PLAVIX) 75 MG tablet Take 1 tablet (75 mg total) by mouth daily. 08/30/21  Yes Barrett, Erin R, PA-C  dapagliflozin propanediol (FARXIGA) 10 MG TABS tablet Take 1 tablet (10 mg total) by mouth daily. 08/30/21  Yes Barrett, Erin R, PA-C  ezetimibe (ZETIA) 10 MG tablet Take 1  tablet (10 mg total) by mouth daily. 10/17/21 06/10/22 Yes Meng, Isaac Laud, PA  insulin degludec (TRESIBA FLEXTOUCH) 100 UNIT/ML FlexTouch Pen Inject 5 Units into the skin daily. 03/22/22  Yes Little Ishikawa, MD  isosorbide mononitrate (IMDUR) 60 MG 24 hr tablet TAKE 1 TABLET BY MOUTH EVERY DAY 03/20/22  Yes Minus Breeding, MD  metoprolol tartrate (LOPRESSOR) 50 MG tablet Take 1 tablet (50 mg total) by mouth 2 (two) times daily. 02/28/22  Yes Minus Breeding, MD  Multiple Vitamin (MULTIVITAMIN WITH MINERALS) TABS tablet Take 1 tablet by mouth daily.   Yes [provider]  nitroGLYCERIN (NITROSTAT) 0.4 MG SL tablet Place 0.4 mg under the tongue every 5 (five) minutes as needed for chest pain.   Yes [provider]  OLANZapine (ZYPREXA) 5 MG tablet Take 5 mg by mouth at bedtime.   Yes [provider]  rosuvastatin (CRESTOR) 40 MG tablet Take 1 tablet (40 mg total) by mouth daily. 08/30/21  Yes Barrett, Erin R, PA-C  tamsulosin (FLOMAX) 0.4 MG CAPS capsule Take 0.4 mg by mouth daily.   Yes [provider]  blood glucose meter kit and supplies KIT Dispense based on patient and insurance preference. Use up to twice a daily as directed. (FOR ICD-10 - type 2 DM uncontrolled E11.9) 08/19/19   Johnson, Clanford L, MD  blood glucose meter kit and supplies Dispense based on patient  and insurance preference. Use up to four times daily as directed. (FOR ICD-10 E10.9, E11.9). 08/23/19   Evelina Dun A, FNP  blood glucose meter kit and supplies Use up to four times daily as directed. dx E11.9 08/23/19   Terald Sleeper, PA-C  Blood Glucose Monitoring Suppl (ONE TOUCH ULTRA 2) w/Device KIT USE TO CHECK BLOOD SUGAR UP TO 4 TIMES DAILY AS DIRECTED 05/15/20   Evelina Dun A, FNP  glucose blood test strip Use as instructed 05/15/20   Evelina Dun A, FNP  Insulin Pen Needle (PEN NEEDLES) 32G X 4 MM MISC 100 each by Does not apply route 4 (four) times daily. E10.9 05/16/20   Sharion Balloon, FNP   Lancets (ONETOUCH DELICA PLUS [id number]) MISC USE TO CHECK BLOOD SUGAR UP TO 4 TIMES A DAY AS DIRECTED 01/23/20   Evelina Dun A, FNP  Nutritional Supplements (,FEEDING SUPPLEMENT, PROSOURCE PLUS) liquid Take 30 mLs by mouth 2 (two) times daily between meals. 03/22/22   Little Ishikawa, MD      Allergies    Enalapril    Review of Systems   Review of Systems  Constitutional:  Negative for chills and fever.  Respiratory:  Negative for cough and shortness of breath.   Cardiovascular:  Negative for chest pain.  Gastrointestinal:  Negative for abdominal pain, nausea and vomiting.  Musculoskeletal:  Positive for arthralgias (Left great toe pain) and back pain. Negative for neck pain.  Skin:  Negative for color change.  Neurological:  Negative for dizziness, syncope, weakness, numbness and headaches.  Psychiatric/Behavioral:  Negative for confusion.     Physical Exam Updated Vital Signs BP 140/86   Pulse 78   Temp 98.4 F (36.9 C) (Oral)   Resp (!) 22   Ht _0  (1.676 m)   Wt 61.2 kg   SpO2 98%   BMI 21.79 kg/m  Physical Exam Vitals and nursing note reviewed.  Constitutional:      General: He is not in acute distress.    Appearance: Normal appearance. He is not ill-appearing.  HENT:     Head: Atraumatic.  Cardiovascular:     Rate and Rhythm: Normal rate and regular rhythm.     Pulses: Normal pulses.  Pulmonary:     Effort: Pulmonary effort is normal.  Musculoskeletal:        General: Tenderness and signs of injury present. No swelling.     Cervical back: Normal range of motion. No tenderness.     Right lower leg: No edema.     Left lower leg: No edema.     Right foot: Decreased range of motion. Tenderness and bony tenderness present. No laceration.     Comments: Tenderness to palpation of the lower thoracic upper and lower lumbar spine and bilateral paraspinal muscles.  Hip flexors and extensors are intact.  Tenderness with ecchymosis to the distal left great toe.   There is mild bleeding to the proximal nail without subungual hematoma or obvious avulsion of the nail.  Skin:    General: Skin is warm.     Capillary Refill: Capillary refill takes less than 2 seconds.  Neurological:     General: No focal deficit present.     Mental Status: He is alert.     Sensory: No sensory deficit.     Motor: No weakness.     ED Results / Procedures / Treatments   Labs (all labs ordered are listed, but only abnormal results are displayed) Labs Reviewed  BASIC METABOLIC PANEL - Abnormal; Notable for the following components:      Result Value   CO2 21 (*)    BUN 30 (*)    Creatinine, Ser 1.52 (*)    GFR, Estimated 47 (*)    All other components within normal limits  CBC WITH DIFFERENTIAL/PLATELET - Abnormal; Notable for the following components:   MCH 25.6 (*)    RDW 17.2 (*)    All other components within normal limits  URINALYSIS, ROUTINE W REFLEX MICROSCOPIC - Abnormal; Notable for the following components:   APPearance HAZY (*)    Glucose, UA >=500 (*)    Ketones, ur 20 (*)    Protein, ur 30 (*)    Bacteria, UA FEW (*)    All other components within normal limits    EKG None  Radiology DG Thoracic Spine W/Swimmers  Result Date: 06/10/2022 CLINICAL DATA:  Repeated falling.  Back pain. EXAM: THORACIC SPINE - 3 VIEWS COMPARISON:  Chest radiography 03/17/2022 FINDINGS: No malalignment. No evidence of thoracic region fracture. No degenerative change. Posteromedial ribs are negative. IMPRESSION: No thoracic region fracture identified. Electronically Signed   By: Nelson Chimes M.D.   On: 06/10/2022 14:30   DG Lumbar Spine Complete  Result Date: 06/10/2022 CLINICAL DATA:  Repeated falls.  Back pain. EXAM: LUMBAR SPINE - COMPLETE 4+ VIEW COMPARISON:  None FINDINGS: Normal alignment. No visible fracture in the lumbar region by radiography. Minimal endplate fracture could be inapparent. Consider MRI if concern persists. No disc space narrowing. No  significant facet arthropathy. Gallstone incidentally noted. IMPRESSION: No acute finding by radiography. Calcified gallstone in the right upper quadrant. Electronically Signed   By: Nelson Chimes M.D.   On: 06/10/2022 14:29   DG Foot 2 Views Left  Result Date: 06/10/2022 CLINICAL DATA:  Fall, right great toe injury EXAM: LEFT FOOT - 2 VIEW COMPARISON:  None Available. FINDINGS: Acute fracture involving the lateral base of the great toe distal phalanx with intra-articular extension to the IP joint. No significant displacement. Joint alignment is maintained without dislocation. Prominent hallux valgus deformity with lateral subluxation of the hallux sesamoids. Moderate first MTP joint osteoarthritis. Mild degenerative changes within the midfoot. Soft tissues within normal limits. IMPRESSION: Acute intra-articular fracture of the base of the great toe distal phalanx. Electronically Signed   By: Davina Poke D.O.   On: 06/10/2022 13:50    Procedures Procedures    Medications Ordered in ED Medications - No data to display  ED Course/ Medical Decision Making/ A&P                           Medical Decision Making Patient here for evaluation of injury sustained in a mechanical fall that occurred yesterday.  He notes having a fall several days ago as well.  X-rays of his lower back ordered by PCP.  Patient here for continued back pain and injury of his left great toe.  He has been having weakness of his lower extremities after CABG procedure last year.  On exam, patient well-appearing nontoxic.  Vital signs reassuring.  He does have some tenderness and along his lower thoracic and lumbar spine.  There is no bony step-offs.  Hip flexors and extensors are intact.  He has some bruising and tenderness of the distal left great toe.  I suspect fracture.  No subungual hematoma in the nail intact.  Amount and/or Complexity of Data Reviewed Labs: ordered.  Details: Labs interpreted by me, no evidence of  leukocytosis.  Hemoglobin reassuring.  Chemistries show mildly elevated BUN and serum creatinine, creatinine improved from 2 months ago.  Urinalysis without evidence of infection. Radiology: ordered.    Details: X-rays of the thoracic and lumbar spine without acute bony injury.  X-rays of the left foot show intra-articular fracture at the base of the distal phalanx of the great toe. ECG/medicine tests: ordered.    Details: EKG reviewed by Dr. Roderic Palau shows sinus rhythm Discussion of management or test interpretation with external provider(s): Patient here with back pain secondary to mechanical fall x2.  No focal neurodeficits on his exam.  No red flags or symptoms to suggest cauda equina.  No recent procedures to suggest spinal abscess.  He has pain and swelling of the left great toe with x-ray showing intra-articular fracture at the distal phalanx.  Toes were buddy taped and postop shoe applied.  He will follow-up with orthopedics.  He appears appropriate for discharge home.  All questions were answered.  Return precautions were discussed           Final Clinical Impression(s) / ED Diagnoses Final diagnoses:  Fall, initial encounter  Closed nondisplaced fracture of distal phalanx of left great toe, initial encounter    Rx / DC Orders ED Discharge Orders     None         Kem Parkinson, Hershal Coria 06/10/22 1901    Milton Ferguson, MD 06/11/22 1641

## 2022-06-23 DIAGNOSIS — E119 Type 2 diabetes mellitus without complications: Secondary | ICD-10-CM | POA: Diagnosis not present

## 2022-07-01 ENCOUNTER — Ambulatory Visit (INDEPENDENT_AMBULATORY_CARE_PROVIDER_SITE_OTHER): Payer: Medicare HMO | Admitting: Orthopaedic Surgery

## 2022-07-01 ENCOUNTER — Ambulatory Visit (INDEPENDENT_AMBULATORY_CARE_PROVIDER_SITE_OTHER): Payer: Medicare HMO

## 2022-07-01 ENCOUNTER — Encounter: Payer: Self-pay | Admitting: Orthopaedic Surgery

## 2022-07-01 VITALS — BP 115/70 | HR 62 | Ht 65.0 in | Wt 135.0 lb

## 2022-07-01 DIAGNOSIS — S92405A Nondisplaced unspecified fracture of left great toe, initial encounter for closed fracture: Secondary | ICD-10-CM | POA: Diagnosis not present

## 2022-07-01 DIAGNOSIS — S92425A Nondisplaced fracture of distal phalanx of left great toe, initial encounter for closed fracture: Secondary | ICD-10-CM

## 2022-07-01 DIAGNOSIS — M545 Low back pain, unspecified: Secondary | ICD-10-CM | POA: Diagnosis not present

## 2022-07-01 MED ORDER — HYDROCODONE-ACETAMINOPHEN 5-325 MG PO TABS: 1.0000 | ORAL_TABLET | ORAL | 0 refills | Status: AC | PRN

## 2022-07-01 NOTE — Progress Notes (Signed)
Subjective:    Patient ID: Robert Osborne, male    DOB: November 05, 1946, 75 y.o.   MRN: [id number]  HPI He hurt his left great toe at home 06-10-22.  He was seen in the ER that day and X-rays showed: IMPRESSION: Acute intra-articular fracture of the base of the great toe distal phalanx.   He also had some back pain and that is still bothering him localized to the lower back.    He is in a post op shoe.  He is feeling better with the foot but the back is still tender.   Review of Systems  Constitutional:  Positive for activity change.  Musculoskeletal:  Positive for arthralgias, back pain, gait problem and joint swelling.  All other systems reviewed and are negative. For Review of Systems, all other systems reviewed and are negative.  The following is a summary of the past history medically, past history surgically, known current medicines, social history and family history.  This information is gathered electronically by the computer from prior information and documentation.  I review this each visit and have found including this information at this point in the chart is beneficial and informative.   Past Medical History:  Diagnosis Date   Bladder cancer ([place]) 08/18/2013   Cancer ([place])    papillary urethral carcinoma,low grade, non-invasive   Diabetes mellitus without complication ([place])    Erectile dysfunction 10/24/2016   Hyperlipidemia associated with type 2 diabetes mellitus ([place]) 05/17/2013   Hypertension associated with diabetes ([place])    Hypokalemia 11/27/2017   Hypomagnesemia 11/27/2017   Major frontotemporal neurocognitive disorder, probable, with behavioral disturbance 11/27/2017   Vitamin D deficiency 05/17/2013    Past Surgical History:  Procedure Laterality Date   COLONOSCOPY W/ POLYPECTOMY     CORONARY ARTERY BYPASS GRAFT N/A 08/22/2021   Procedure: CORONARY ARTERY BYPASS GRAFTING (CABG), ON PUMP, TIMES FOUR , USING LEFT INTERNAL MAMMARY ARTERY AND ENDOSCOPICALLY HARVESTED  RIGHT GREATER SAPHENOUS VEIN;  Surgeon: Lajuana Matte, MD;  Location: [place];  Service: Open Heart Surgery;  Laterality: N/A;   CORONARY BALLOON ANGIOPLASTY N/A 08/16/2021   Procedure: CORONARY BALLOON ANGIOPLASTY;  Surgeon: Jettie Booze, MD;  Location: [place] [facility];  Service: Cardiovascular;  Laterality: N/A;   ENDOVEIN HARVEST OF GREATER SAPHENOUS VEIN  08/22/2021   Procedure: ENDOVEIN HARVEST OF GREATER SAPHENOUS VEIN;  Surgeon: Lajuana Matte, MD;  Location: [place];  Service: Open Heart Surgery;;   LEFT HEART CATH AND CORONARY ANGIOGRAPHY N/A 08/16/2021   Procedure: LEFT HEART CATH AND CORONARY ANGIOGRAPHY;  Surgeon: Jettie Booze, MD;  Location: [place] [facility];  Service: Cardiovascular;  Laterality: N/A;   TEE WITHOUT CARDIOVERSION N/A 08/22/2021   Procedure: TRANSESOPHAGEAL ECHOCARDIOGRAM (TEE);  Surgeon: Lajuana Matte, MD;  Location: [place];  Service: Open Heart Surgery;  Laterality: N/A;    Current Outpatient Medications on File Prior to Visit  Medication Sig Dispense Refill   aspirin EC 81 MG EC tablet Take 1 tablet (81 mg total) by mouth daily. Swallow whole. 30 tablet 11   blood glucose meter kit and supplies KIT Dispense based on patient and insurance preference. Use up to twice a daily as directed. (FOR ICD-10 - type 2 DM uncontrolled E11.9) 1 each 0   blood glucose meter kit and supplies Dispense based on patient and insurance preference. Use up to four times daily as directed. (FOR ICD-10 E10.9, E11.9). 1 each 0   blood glucose meter kit and supplies Use  up to four times daily as directed. dx E11.9 1 each 1   Blood Glucose Monitoring Suppl (ONE TOUCH ULTRA 2) w/Device KIT USE TO CHECK BLOOD SUGAR UP TO 4 TIMES DAILY AS DIRECTED 1 kit 0   clopidogrel (PLAVIX) 75 MG tablet Take 1 tablet (75 mg total) by mouth daily. 30 tablet 3   dapagliflozin propanediol (FARXIGA) 10 MG TABS tablet Take 1 tablet (10 mg total) by mouth daily. 30 tablet 3   ezetimibe  (ZETIA) 10 MG tablet Take 1 tablet (10 mg total) by mouth daily. 90 tablet 3   glucose blood test strip Use as instructed 100 each 12   insulin degludec (TRESIBA FLEXTOUCH) 100 UNIT/ML FlexTouch Pen Inject 5 Units into the skin daily. 3 mL 0   Insulin Pen Needle (PEN NEEDLES) 32G X 4 MM MISC 100 each by Does not apply route 4 (four) times daily. E10.9 100 each 5   isosorbide mononitrate (IMDUR) 60 MG 24 hr tablet TAKE 1 TABLET BY MOUTH EVERY DAY 90 tablet 1   Lancets (ONETOUCH DELICA PLUS LANCET33G) MISC USE TO CHECK BLOOD SUGAR UP TO 4 TIMES A DAY AS DIRECTED 100 each 0   metoprolol tartrate (LOPRESSOR) 50 MG tablet Take 1 tablet (50 mg total) by mouth 2 (two) times daily. 180 tablet 1   Multiple Vitamin (MULTIVITAMIN WITH MINERALS) TABS tablet Take 1 tablet by mouth daily.     nitroGLYCERIN (NITROSTAT) 0.4 MG SL tablet Place 0.4 mg under the tongue every 5 (five) minutes as needed for chest pain.     Nutritional Supplements (,FEEDING SUPPLEMENT, PROSOURCE PLUS) liquid Take 30 mLs by mouth 2 (two) times daily between meals. 887 mL 1   OLANZapine (ZYPREXA) 5 MG tablet Take 5 mg by mouth at bedtime.     rosuvastatin (CRESTOR) 40 MG tablet Take 1 tablet (40 mg total) by mouth daily. 30 tablet 3   tamsulosin (FLOMAX) 0.4 MG CAPS capsule Take 0.4 mg by mouth daily.     No current facility-administered medications on file prior to visit.    Social History   Socioeconomic History   Marital status: Married    Spouse name: Not on file   Number of children: 3   Years of education: Not on file   Highest education level: Not on file  Occupational History   Occupation: landscaping    Comment: retired, works part time  Tobacco Use   Smoking status: Former    Types: Cigarettes    Quit date: 05/17/1973    Years since quitting: 49.1   Smokeless tobacco: Never  Vaping Use   Vaping Use: Never used  Substance and Sexual Activity   Alcohol use: No   Drug use: No   Sexual activity: Not on file   Other Topics Concern   Not on file  Social History Narrative   Still works as Administrator.      Social Determinants of Health   Financial Resource Strain: Not on file  Food Insecurity: Not on file  Transportation Needs: Not on file  Physical Activity: Not on file  Stress: Not on file  Social Connections: Not on file  Intimate Partner Violence: Not on file    Family History  Problem Relation Age of Onset   Heart attack Maternal Grandmother    Stroke Maternal Grandfather    Heart attack Mother 63    BP 115/70   Pulse 62   Ht 5\' 5"  (1.651 m)   Wt 135 lb (61.2 kg)  BMI 22.47 kg/m   Body mass index is 22.47 kg/m.      Objective:   Physical Exam Vitals and nursing note reviewed. Exam conducted with a chaperone present.  Constitutional:      Appearance: He is well-developed.  HENT:     Head: Normocephalic and atraumatic.  Eyes:     Conjunctiva/sclera: Conjunctivae normal.     Pupils: Pupils are equal, round, and reactive to light.  Cardiovascular:     Rate and Rhythm: Normal rate and regular rhythm.  Pulmonary:     Effort: Pulmonary effort is normal.  Abdominal:     Palpations: Abdomen is soft.  Musculoskeletal:     Cervical back: Normal range of motion and neck supple.       Feet:  Skin:    General: Skin is warm and dry.  Neurological:     Mental Status: He is alert and oriented to person, place, and time.     Cranial Nerves: No cranial nerve deficit.     Motor: No abnormal muscle tone.     Coordination: Coordination normal.     Deep Tendon Reflexes: Reflexes are normal and symmetric. Reflexes normal.  Psychiatric:        Behavior: Behavior normal.        Thought Content: Thought content normal.        Judgment: Judgment normal.   X-rays were done of the left great toe today, reported separately.  I reviewed the ER records.  I have independently reviewed and interpreted x-rays of this patient done at another site by another physician or qualified  health professional.         Assessment & Plan:   Encounter Diagnoses  Name Primary?   Closed nondisplaced fracture of distal phalanx of left great toe, initial encounter Yes   Lumbar pain    Continue the post op shoe.  I will set up PT for his back in [place].  Return in two weeks., X-rays left great toe on return.  I have reviewed the [place] web site prior to prescribing narcotic medicine for this patient.  I have given Rx for Norco 5.  Call if any problem.  Precautions discussed. . Electronically Signed Sanjuana Kava, MD 7/18/202311:38 AM

## 2022-07-15 ENCOUNTER — Ambulatory Visit (INDEPENDENT_AMBULATORY_CARE_PROVIDER_SITE_OTHER): Payer: Medicare HMO

## 2022-07-15 ENCOUNTER — Ambulatory Visit (INDEPENDENT_AMBULATORY_CARE_PROVIDER_SITE_OTHER): Payer: Medicare HMO | Admitting: Orthopaedic Surgery

## 2022-07-15 ENCOUNTER — Encounter: Payer: Self-pay | Admitting: Orthopaedic Surgery

## 2022-07-15 DIAGNOSIS — S92425A Nondisplaced fracture of distal phalanx of left great toe, initial encounter for closed fracture: Secondary | ICD-10-CM | POA: Diagnosis not present

## 2022-07-15 DIAGNOSIS — M545 Low back pain, unspecified: Secondary | ICD-10-CM

## 2022-07-15 DIAGNOSIS — S92425D Nondisplaced fracture of distal phalanx of left great toe, subsequent encounter for fracture with routine healing: Secondary | ICD-10-CM | POA: Diagnosis not present

## 2022-07-15 MED ORDER — HYDROCODONE-ACETAMINOPHEN 5-325 MG PO TABS: ORAL_TABLET | ORAL | 0 refills | Status: DC

## 2022-07-15 NOTE — Progress Notes (Signed)
My toe is better, the medicine helped my back too  His left great toe is not hurting.  He is using the post op shoe.  X-rays were done of the left great toe, reported separately.  His left great toe is not swollen, NV intact, ROM good.  Spine/Pelvis examination:  Inspection:  Overall, sacoiliac joint benign and hips nontender; without crepitus or defects.   Thoracic spine inspection: Alignment normal without kyphosis present   Lumbar spine inspection:  Alignment  with normal lumbar lordosis, without scoliosis apparent.   Thoracic spine palpation:  without tenderness of spinal processes   Lumbar spine palpation: without tenderness of lumbar area; without tightness of lumbar muscles    Range of Motion:   Lumbar flexion, forward flexion is normal without pain or tenderness    Lumbar extension is full without pain or tenderness   Left lateral bend is normal without pain or tenderness   Right lateral bend is normal without pain or tenderness   Straight leg raising is normal  Strength & tone: normal   Stability overall normal stability  Encounter Diagnoses  Name Primary?   Closed nondisplaced fracture of distal phalanx of left great toe with routine healing, subsequent encounter Yes   Lumbar pain    He can come out of post op shoe.  Return in two weeks.  I do not plan to get X-rays then.  I have reviewed the [facility] web site prior to prescribing narcotic medicine for this patient.  Call if any problem.  Precautions discussed.  Electronically Signed Sanjuana Kava, MD 8/1/202310:31 AM

## 2022-07-24 DIAGNOSIS — E119 Type 2 diabetes mellitus without complications: Secondary | ICD-10-CM | POA: Diagnosis not present

## 2022-07-30 ENCOUNTER — Ambulatory Visit: Payer: Medicare HMO | Admitting: Orthopaedic Surgery

## 2022-07-31 ENCOUNTER — Ambulatory Visit: Payer: Medicare HMO | Admitting: Orthopaedic Surgery

## 2022-07-31 ENCOUNTER — Encounter: Payer: Self-pay | Admitting: Orthopaedic Surgery

## 2022-07-31 VITALS — Ht 65.0 in | Wt 135.0 lb

## 2022-07-31 DIAGNOSIS — S92425D Nondisplaced fracture of distal phalanx of left great toe, subsequent encounter for fracture with routine healing: Secondary | ICD-10-CM

## 2022-07-31 MED ORDER — HYDROCODONE-ACETAMINOPHEN 5-325 MG PO TABS: ORAL_TABLET | ORAL | 0 refills | Status: DC

## 2022-07-31 NOTE — Progress Notes (Signed)
My foot is better.  He is walking well.  He has no pain to the left foot or great toe.    NV intact.   Encounter Diagnosis  Name Primary?   Closed nondisplaced fracture of distal phalanx of left great toe with routine healing, subsequent encounter Yes   I will refill pain medicine one more time.  Return as needed.  Call if any problem.  Precautions discussed.  Electronically Signed Sanjuana Kava, MD 8/17/202310:01 AM

## 2022-08-15 DIAGNOSIS — E119 Type 2 diabetes mellitus without complications: Secondary | ICD-10-CM | POA: Diagnosis not present

## 2022-08-17 ENCOUNTER — Other Ambulatory Visit: Payer: Self-pay | Admitting: Physician Assistant

## 2022-08-17 DIAGNOSIS — E785 Hyperlipidemia, unspecified: Secondary | ICD-10-CM

## 2022-08-31 ENCOUNTER — Other Ambulatory Visit: Payer: Self-pay | Admitting: Cardiology

## 2022-08-31 DIAGNOSIS — I2510 Atherosclerotic heart disease of native coronary artery without angina pectoris: Secondary | ICD-10-CM | POA: Insufficient documentation

## 2022-08-31 NOTE — Progress Notes (Unsigned)
Cardiology Office Note   Date:  09/01/2022   ID:  Robert Osborne, DOB 06/14/1946, MRN [id number]  PCP:  [name], PA-C  Cardiologist:   Rollene Rotunda, MD   Chief Complaint  Patient presents with   Coronary Artery Disease      History of Present Illness: Robert Osborne is a 76 y.o. male who presents for follow up of CAD.    The patient had a low risk nuclear stress test at [facility] in May 2022.  He went to [facility] on 08/15/2021 for evaluation of chest pain.  CTA was negative for PE, but noted a 5 mm right upper lobe lung nodule .  Echocardiogram obtained on 08/16/2021 showed EF 55%, moderate asymmetric LVH of basal anteroseptal segment, grade 1 DD, new wall motion abnormality suggestive of mid to distal LAD disease versus possible stress-induced cardiomyopathy, trivial MR. Cardiac catheterization performed on 08/16/2021 showed EF EF 45 to 50%, 75% ostial left circumflex, 80% OM 2, 90% OM1, 25% ostial LAD, 90% D1, 90% mid LAD, 100% distal LAD occlusion, 90% RPAV, 25% mid RCA.  Culprit lesion was felt to be occlusion in the mid LAD territory, however he has heavily calcified lesion in the other vessels as well.  The lesion was successfully wired and flow restored, however a balloon could not be advanced through the heavily calcified disease more proximally.  CT surgery was consulted for consideration of CABG.  The patient ultimately underwent CABG x4 with LIMA-diagonal, reverse SVG-LAD, reverse SVG-PDA and reverse SVG-OM by Dr. Cliffton Asters on 08/22/2021.  He also required endarterectomy in the operating room of his LAD.  Postprocedure, he was started on aspirin and Plavix.  Unfortunately, after bypass surgery, cardiology service got called again on 08/23/2021 for midsternal chest pain.  Repeat EKG showed stable ST elevation in V2-V4.  Repeat echocardiogram obtained on 08/23/2021 showed EF 50 to 55%, apical akinesis, moderately reduced RV EF, free wall hypokinesis.  Echocardiogram was reviewed  by Dr. [place] who felt wall motion abnormality is unchanged when compared to the previous echo on 9/2, in reviewing the cardiac cath film, LAD was diffusely diseased and calcified, therefore he did not believe there was anything that we could offer percutaneously even if the vessel/graft went down, medical management was recommended.    Since I last saw him he was in the hospital with sepsis.  I reviewed these records for this visit.  He is actually done well since then aside from a fall in June.  He worked with physical therapy.  He did go to rehab after his last hospitalization.  He says he is no longer falling. The patient denies any new symptoms such as chest discomfort, neck or arm discomfort. There has been no new shortness of breath, PND or orthopnea. There have been no reported palpitations, presyncope or syncope.   Past Medical History:  Diagnosis Date   Bladder cancer (HCC) 08/18/2013   Cancer (HCC)    papillary urethral carcinoma,low grade, non-invasive   Diabetes mellitus without complication (HCC)    Erectile dysfunction 10/24/2016   Hyperlipidemia associated with type 2 diabetes mellitus (HCC) 05/17/2013   Hypertension associated with diabetes (HCC)    Hypokalemia 11/27/2017   Hypomagnesemia 11/27/2017   Major frontotemporal neurocognitive disorder, probable, with behavioral disturbance 11/27/2017   Vitamin D deficiency 05/17/2013    Past Surgical History:  Procedure Laterality Date   COLONOSCOPY W/ POLYPECTOMY     CORONARY ARTERY BYPASS GRAFT N/A 08/22/2021   Procedure: CORONARY  ARTERY BYPASS GRAFTING (CABG), ON PUMP, TIMES FOUR , USING LEFT INTERNAL MAMMARY ARTERY AND ENDOSCOPICALLY HARVESTED RIGHT GREATER SAPHENOUS VEIN;  Surgeon: Lajuana Matte, MD;  Location: [place];  Service: Open Heart Surgery;  Laterality: N/A;   CORONARY BALLOON ANGIOPLASTY N/A 08/16/2021   Procedure: CORONARY BALLOON ANGIOPLASTY;  Surgeon: Jettie Booze, MD;  Location: [place] [facility];  Service:  Cardiovascular;  Laterality: N/A;   ENDOVEIN HARVEST OF GREATER SAPHENOUS VEIN  08/22/2021   Procedure: ENDOVEIN HARVEST OF GREATER SAPHENOUS VEIN;  Surgeon: Lajuana Matte, MD;  Location: [place];  Service: Open Heart Surgery;;   LEFT HEART CATH AND CORONARY ANGIOGRAPHY N/A 08/16/2021   Procedure: LEFT HEART CATH AND CORONARY ANGIOGRAPHY;  Surgeon: Jettie Booze, MD;  Location: [place] [facility];  Service: Cardiovascular;  Laterality: N/A;   TEE WITHOUT CARDIOVERSION N/A 08/22/2021   Procedure: TRANSESOPHAGEAL ECHOCARDIOGRAM (TEE);  Surgeon: Lajuana Matte, MD;  Location: [place];  Service: Open Heart Surgery;  Laterality: N/A;     Current Outpatient Medications  Medication Sig Dispense Refill   aspirin EC 81 MG EC tablet Take 1 tablet (81 mg total) by mouth daily. Swallow whole. 30 tablet 11   blood glucose meter kit and supplies KIT Dispense based on patient and insurance preference. Use up to twice a daily as directed. (FOR ICD-10 - type 2 DM uncontrolled E11.9) 1 each 0   blood glucose meter kit and supplies Dispense based on patient and insurance preference. Use up to four times daily as directed. (FOR ICD-10 E10.9, E11.9). 1 each 0   blood glucose meter kit and supplies Use up to four times daily as directed. dx E11.9 1 each 1   Blood Glucose Monitoring Suppl (ONE TOUCH ULTRA 2) w/Device KIT USE TO CHECK BLOOD SUGAR UP TO 4 TIMES DAILY AS DIRECTED 1 kit 0   clopidogrel (PLAVIX) 75 MG tablet Take 1 tablet (75 mg total) by mouth daily. 30 tablet 3   dapagliflozin propanediol (FARXIGA) 10 MG TABS tablet Take 1 tablet (10 mg total) by mouth daily. 30 tablet 3   ezetimibe (ZETIA) 10 MG tablet TAKE 1 TABLET BY MOUTH EVERY DAY 90 tablet 3   glucose blood test strip Use as instructed 100 each 12   HYDROcodone-acetaminophen (NORCO/VICODIN) 5-325 MG tablet One tablet every six hours for pain.  Limit 7 days. 28 tablet 0   insulin degludec (TRESIBA FLEXTOUCH) 100 UNIT/ML FlexTouch Pen  Inject 5 Units into the skin daily. 3 mL 0   Insulin Pen Needle (PEN NEEDLES) 32G X 4 MM MISC 100 each by Does not apply route 4 (four) times daily. E10.9 100 each 5   isosorbide mononitrate (IMDUR) 60 MG 24 hr tablet TAKE 1 TABLET BY MOUTH EVERY DAY 90 tablet 1   Lancets (ONETOUCH DELICA PLUS [id number]) MISC USE TO CHECK BLOOD SUGAR UP TO 4 TIMES A DAY AS DIRECTED 100 each 0   metoprolol tartrate (LOPRESSOR) 50 MG tablet Take 1 tablet (50 mg total) by mouth 2 (two) times daily. 180 tablet 1   Multiple Vitamin (MULTIVITAMIN WITH MINERALS) TABS tablet Take 1 tablet by mouth daily.     nitroGLYCERIN (NITROSTAT) 0.4 MG SL tablet Place 0.4 mg under the tongue every 5 (five) minutes as needed for chest pain.     Nutritional Supplements (,FEEDING SUPPLEMENT, PROSOURCE PLUS) liquid Take 30 mLs by mouth 2 (two) times daily between meals. 887 mL 1   OLANZapine (ZYPREXA) 5 MG tablet Take 5 mg  by mouth at bedtime.     rosuvastatin (CRESTOR) 40 MG tablet Take 1 tablet (40 mg total) by mouth daily. 30 tablet 3   tamsulosin (FLOMAX) 0.4 MG CAPS capsule Take 0.4 mg by mouth daily.     glimepiride (AMARYL) 2 MG tablet Take 2 mg by mouth daily.     traMADol (ULTRAM) 50 MG tablet Take 50 mg by mouth every 6 (six) hours as needed.     No current facility-administered medications for this visit.    Allergies:   Enalapril    ROS:  Please see the history of present illness.   Otherwise, review of systems are positive for none.   All other systems are reviewed and negative.    PHYSICAL EXAM: VS:  BP 130/62   Pulse (!) 57   Ht $R'5\' 7"'IP$  (1.702 m)   Wt 152 lb (68.9 kg)   SpO2 99%   BMI 23.81 kg/m  , BMI Body mass index is 23.81 kg/m. GENERAL:  Well appearing NECK:  No jugular venous distention, waveform within normal limits, carotid upstroke brisk and symmetric, no bruits, no thyromegaly LUNGS:  Clear to auscultation bilaterally CHEST:  .Well healed sternotomy scar.  HEART:  PMI not displaced or sustained,S1  and S2 within normal limits, no S3, no S4, no clicks, no rubs, no murmurs ABD:  Flat, positive bowel sounds normal in frequency in pitch, no bruits, no rebound, no guarding, no midline pulsatile mass, no hepatomegaly, no splenomegaly EXT:  2 plus pulses throughout, no edema, no cyanosis no clubbing   EKG:  EKG is  ordered today. Sinus rhythm, rate 57, axis within normal limits, intervals within normal limits, no acute ST-T wave changes.  Recent Labs: 03/16/2022: ALT 41; B Natriuretic Peptide 620.0 03/17/2022: Magnesium 1.9 06/10/2022: BUN 30; Creatinine, Ser 1.52; Hemoglobin 14.2; Platelets 176; Potassium 4.3; Sodium 139    Lipid Panel    Component Value Date/Time   CHOL 85 02/07/2022 1213   CHOL 135 10/10/2021 0847   CHOL 125 05/17/2013 1513   TRIG 71 02/07/2022 1213   TRIG 139 02/21/2014 1632   TRIG 125 05/17/2013 1513   HDL 38 (L) 02/07/2022 1213   HDL 39 (L) 10/10/2021 0847   HDL 47 02/21/2014 1632   HDL 39 (L) 05/17/2013 1513   CHOLHDL 2.2 02/07/2022 1213   VLDL 14 02/07/2022 1213   LDLCALC 33 02/07/2022 1213   LDLCALC 77 10/10/2021 0847   LDLCALC 61 02/21/2014 1632   LDLCALC 61 05/17/2013 1513      Wt Readings from Last 3 Encounters:  09/01/22 152 lb (68.9 kg)  07/31/22 135 lb (61.2 kg)  07/01/22 135 lb (61.2 kg)      Other studies Reviewed: Additional studies/ records that were reviewed today include: Hospital records. Review of the above records demonstrates:  Please see elsewhere in the note.     ASSESSMENT AND PLAN:  CAD/CABG:  The patient has no new sypmtoms.  No further cardiovascular testing is indicated.  We will continue with aggressive risk reduction and meds as listed.  HTN: His blood pressure is at target.  No change in therapy.   DYSLIPIDEMIA: LDL was 33 with an HDL of 38.  No change in therapy.   DM: A1c is 7.3 which is down from 8.0.  No change in therapy.  Current medicines are reviewed at length with the patient today.  The patient does not  have concerns regarding medicines.  The following changes have been made:  None  Labs/  tests ordered today include: None  Orders Placed This Encounter  Procedures   EKG 12-Lead     Disposition:   FU with me APP in 6 months.     Signed, Minus Breeding, MD  09/01/2022 12:23 PM    [facility]

## 2022-09-01 ENCOUNTER — Encounter: Payer: Self-pay | Admitting: Cardiology

## 2022-09-01 ENCOUNTER — Ambulatory Visit: Payer: Medicare HMO | Attending: Cardiology | Admitting: Cardiology

## 2022-09-01 VITALS — BP 130/62 | HR 57 | Ht 67.0 in | Wt 152.0 lb

## 2022-09-01 DIAGNOSIS — I2510 Atherosclerotic heart disease of native coronary artery without angina pectoris: Secondary | ICD-10-CM

## 2022-09-01 DIAGNOSIS — E118 Type 2 diabetes mellitus with unspecified complications: Secondary | ICD-10-CM

## 2022-09-01 DIAGNOSIS — I10 Essential (primary) hypertension: Secondary | ICD-10-CM | POA: Diagnosis not present

## 2022-09-01 DIAGNOSIS — E785 Hyperlipidemia, unspecified: Secondary | ICD-10-CM

## 2022-09-01 NOTE — Patient Instructions (Signed)
Medication Instructions:  The current medical regimen is effective;  continue present plan and medications.  *If you need a refill on your cardiac medications before your next appointment, please call your pharmacy*   Follow-Up: At [facility], you and your health needs are our priority.  As part of our continuing mission to provide you with exceptional heart care, we have created designated Provider Care Teams.  These Care Teams include your primary Cardiologist (physician) and Advanced Practice Providers (APPs -  Physician Assistants and Nurse Practitioners) who all work together to provide you with the care you need, when you need it.  We recommend signing up for the patient portal called "MyChart".  Sign up information is provided on this After Visit Summary.  MyChart is used to connect with patients for Virtual Visits (Telemedicine).  Patients are able to view lab/test results, encounter notes, upcoming appointments, etc.  Non-urgent messages can be sent to your provider as well.   To learn more about what you can do with MyChart, go to [email or web address].    Your next appointment:   6 month(s)  The format for your next appointment:   In Person  Provider:   James Hochrein, MD          

## 2022-09-22 DIAGNOSIS — I10 Essential (primary) hypertension: Secondary | ICD-10-CM | POA: Diagnosis not present

## 2022-09-22 DIAGNOSIS — F02818 Dementia in other diseases classified elsewhere, unspecified severity, with other behavioral disturbance: Secondary | ICD-10-CM | POA: Diagnosis not present

## 2022-09-22 DIAGNOSIS — G3109 Other frontotemporal dementia: Secondary | ICD-10-CM | POA: Diagnosis not present

## 2022-09-22 DIAGNOSIS — Z794 Long term (current) use of insulin: Secondary | ICD-10-CM | POA: Diagnosis not present

## 2022-09-22 DIAGNOSIS — E1142 Type 2 diabetes mellitus with diabetic polyneuropathy: Secondary | ICD-10-CM | POA: Diagnosis not present

## 2022-09-22 DIAGNOSIS — E1169 Type 2 diabetes mellitus with other specified complication: Secondary | ICD-10-CM | POA: Diagnosis not present

## 2022-09-22 DIAGNOSIS — E1122 Type 2 diabetes mellitus with diabetic chronic kidney disease: Secondary | ICD-10-CM | POA: Diagnosis not present

## 2022-09-22 DIAGNOSIS — I152 Hypertension secondary to endocrine disorders: Secondary | ICD-10-CM | POA: Diagnosis not present

## 2022-09-22 DIAGNOSIS — E785 Hyperlipidemia, unspecified: Secondary | ICD-10-CM | POA: Diagnosis not present

## 2022-09-22 DIAGNOSIS — R29898 Other symptoms and signs involving the musculoskeletal system: Secondary | ICD-10-CM | POA: Diagnosis not present

## 2022-09-22 DIAGNOSIS — E1159 Type 2 diabetes mellitus with other circulatory complications: Secondary | ICD-10-CM | POA: Diagnosis not present

## 2022-11-12 DIAGNOSIS — E119 Type 2 diabetes mellitus without complications: Secondary | ICD-10-CM | POA: Diagnosis not present

## 2022-11-14 DIAGNOSIS — E1122 Type 2 diabetes mellitus with diabetic chronic kidney disease: Secondary | ICD-10-CM | POA: Diagnosis not present

## 2022-11-14 DIAGNOSIS — Z794 Long term (current) use of insulin: Secondary | ICD-10-CM | POA: Diagnosis not present

## 2022-11-18 ENCOUNTER — Encounter: Payer: Self-pay | Admitting: *Deleted

## 2022-11-18 ENCOUNTER — Telehealth: Payer: Self-pay | Admitting: *Deleted

## 2022-11-18 NOTE — Patient Outreach (Signed)
  Care Coordination   Initial Visit Note   11/18/2022 Name: Robert Osborne MRN: [id number] DOB: [birth year]  Robert Osborne is a 76 y.o. year old male who sees [facility], [place] for primary care. I spoke with  [name] by phone today.  What matters to the patients health and wellness today?  No needs.   SDOH assessments and interventions completed:  Yes  SDOH Interventions Today    Flowsheet Row Most Recent Value  SDOH Interventions   Food Insecurity Interventions Intervention Not Indicated  Housing Interventions Intervention Not Indicated  Transportation Interventions Intervention Not Indicated  Utilities Interventions Intervention Not Indicated        Care Coordination Interventions:  No, not indicated   Follow up plan: No further intervention required.   Encounter Outcome:  Pt. Visit Completed   Kayleen Memos C. Myrtie Neither, MSN, [facility] Gerontological Nurse Practitioner [facility] Care Management [phone]

## 2022-12-02 DIAGNOSIS — B351 Tinea unguium: Secondary | ICD-10-CM | POA: Diagnosis not present

## 2022-12-02 DIAGNOSIS — L84 Corns and callosities: Secondary | ICD-10-CM | POA: Diagnosis not present

## 2022-12-02 DIAGNOSIS — E1142 Type 2 diabetes mellitus with diabetic polyneuropathy: Secondary | ICD-10-CM | POA: Diagnosis not present

## 2022-12-02 DIAGNOSIS — M79676 Pain in unspecified toe(s): Secondary | ICD-10-CM | POA: Diagnosis not present

## 2023-02-12 ENCOUNTER — Encounter: Payer: Self-pay | Admitting: Radiology

## 2023-02-26 ENCOUNTER — Other Ambulatory Visit: Payer: Self-pay | Admitting: Cardiology

## 2023-08-16 ENCOUNTER — Other Ambulatory Visit: Payer: Self-pay | Admitting: Cardiology

## 2023-08-16 DIAGNOSIS — E785 Hyperlipidemia, unspecified: Secondary | ICD-10-CM

## 2023-09-11 ENCOUNTER — Other Ambulatory Visit: Payer: Self-pay | Admitting: Cardiology

## 2023-09-11 DIAGNOSIS — E785 Hyperlipidemia, unspecified: Secondary | ICD-10-CM

## 2023-09-20 ENCOUNTER — Other Ambulatory Visit: Payer: Self-pay | Admitting: Cardiology

## 2023-10-02 ENCOUNTER — Other Ambulatory Visit: Payer: Self-pay | Admitting: Cardiology

## 2023-10-07 ENCOUNTER — Other Ambulatory Visit: Payer: Self-pay | Admitting: Cardiology

## 2023-10-07 DIAGNOSIS — E785 Hyperlipidemia, unspecified: Secondary | ICD-10-CM

## 2023-10-16 ENCOUNTER — Emergency Department (HOSPITAL_COMMUNITY): Payer: Medicare HMO

## 2023-10-16 ENCOUNTER — Encounter (HOSPITAL_COMMUNITY): Payer: Self-pay

## 2023-10-16 ENCOUNTER — Emergency Department (HOSPITAL_COMMUNITY)
Admission: EM | Admit: 2023-10-16 | Discharge: 2023-10-17 | Disposition: A | Discharge status: Home / Self Care | Payer: Medicare HMO | Attending: Emergency Medicine | Admitting: Emergency Medicine

## 2023-10-16 ENCOUNTER — Other Ambulatory Visit: Payer: Self-pay

## 2023-10-16 DIAGNOSIS — E1122 Type 2 diabetes mellitus with diabetic chronic kidney disease: Secondary | ICD-10-CM | POA: Insufficient documentation

## 2023-10-16 DIAGNOSIS — R531 Weakness: Secondary | ICD-10-CM | POA: Insufficient documentation

## 2023-10-16 DIAGNOSIS — N189 Chronic kidney disease, unspecified: Secondary | ICD-10-CM | POA: Insufficient documentation

## 2023-10-16 DIAGNOSIS — I129 Hypertensive chronic kidney disease with stage 1 through stage 4 chronic kidney disease, or unspecified chronic kidney disease: Secondary | ICD-10-CM | POA: Diagnosis not present

## 2023-10-16 DIAGNOSIS — R0602 Shortness of breath: Secondary | ICD-10-CM | POA: Insufficient documentation

## 2023-10-16 DIAGNOSIS — Z79899 Other long term (current) drug therapy: Secondary | ICD-10-CM | POA: Insufficient documentation

## 2023-10-16 DIAGNOSIS — M545 Low back pain, unspecified: Secondary | ICD-10-CM | POA: Diagnosis not present

## 2023-10-16 DIAGNOSIS — F0390 Unspecified dementia without behavioral disturbance: Secondary | ICD-10-CM | POA: Diagnosis not present

## 2023-10-16 DIAGNOSIS — Z7982 Long term (current) use of aspirin: Secondary | ICD-10-CM | POA: Diagnosis not present

## 2023-10-16 DIAGNOSIS — Z7984 Long term (current) use of oral hypoglycemic drugs: Secondary | ICD-10-CM | POA: Diagnosis not present

## 2023-10-16 DIAGNOSIS — R29898 Other symptoms and signs involving the musculoskeletal system: Secondary | ICD-10-CM

## 2023-10-16 DIAGNOSIS — Z794 Long term (current) use of insulin: Secondary | ICD-10-CM | POA: Insufficient documentation

## 2023-10-16 DIAGNOSIS — Z7902 Long term (current) use of antithrombotics/antiplatelets: Secondary | ICD-10-CM | POA: Insufficient documentation

## 2023-10-16 LAB — COMPREHENSIVE METABOLIC PANEL
ALT: 30 U/L (ref 0–44)
AST: 48 U/L — ABNORMAL HIGH (ref 15–41)
Albumin: 3.8 g/dL (ref 3.5–5.0)
Alkaline Phosphatase: 76 U/L (ref 38–126)
Anion gap: 8 (ref 5–15)
BUN: 24 mg/dL — ABNORMAL HIGH (ref 8–23)
CO2: 22 mmol/L (ref 22–32)
Calcium: 8.8 mg/dL — ABNORMAL LOW (ref 8.9–10.3)
Chloride: 108 mmol/L (ref 98–111)
Creatinine, Ser: 1.34 mg/dL — ABNORMAL HIGH (ref 0.61–1.24)
GFR, Estimated: 54 mL/min — ABNORMAL LOW (ref >60)
Glucose, Bld: 147 mg/dL — ABNORMAL HIGH (ref 70–99)
Potassium: 5.4 mmol/L — ABNORMAL HIGH (ref 3.5–5.1)
Sodium: 138 mmol/L (ref 135–145)
Total Bilirubin: 1.3 mg/dL — ABNORMAL HIGH (ref 0.3–1.2)
Total Protein: 8 g/dL (ref 6.5–8.1)

## 2023-10-16 LAB — URINALYSIS, W/ REFLEX TO CULTURE (INFECTION SUSPECTED)
Bacteria, UA: NONE SEEN
Bilirubin Urine: NEGATIVE
Glucose, UA: NEGATIVE mg/dL
Hgb urine dipstick: NEGATIVE
Ketones, ur: NEGATIVE mg/dL
Leukocytes,Ua: NEGATIVE
Nitrite: NEGATIVE
Protein, ur: 100 mg/dL — AB
Specific Gravity, Urine: 1.02 (ref 1.005–1.030)
pH: 5 (ref 5.0–8.0)

## 2023-10-16 LAB — CBC WITH DIFFERENTIAL/PLATELET
Abs Immature Granulocytes: 0.02 10*3/uL (ref 0.00–0.07)
Basophils Absolute: 0 10*3/uL (ref 0.0–0.1)
Basophils Relative: 0 %
Eosinophils Absolute: 0 10*3/uL (ref 0.0–0.5)
Eosinophils Relative: 0 %
HCT: 45.9 % (ref 39.0–52.0)
Hemoglobin: 14.7 g/dL (ref 13.0–17.0)
Immature Granulocytes: 0 %
Lymphocytes Relative: 10 %
Lymphs Abs: 0.6 10*3/uL — ABNORMAL LOW (ref 0.7–4.0)
MCH: 27.8 pg (ref 26.0–34.0)
MCHC: 32 g/dL (ref 30.0–36.0)
MCV: 86.8 fL (ref 80.0–100.0)
Monocytes Absolute: 0.5 10*3/uL (ref 0.1–1.0)
Monocytes Relative: 8 %
Neutro Abs: 5 10*3/uL (ref 1.7–7.7)
Neutrophils Relative %: 82 %
Platelets: 136 10*3/uL — ABNORMAL LOW (ref 150–400)
RBC: 5.29 MIL/uL (ref 4.22–5.81)
RDW: 14.3 % (ref 11.5–15.5)
WBC: 6.1 10*3/uL (ref 4.0–10.5)
nRBC: 0 % (ref 0.0–0.2)

## 2023-10-16 LAB — TROPONIN I (HIGH SENSITIVITY)
Troponin I (High Sensitivity): 9 ng/L (ref <18)
Troponin I (High Sensitivity): 11 ng/L (ref <18)

## 2023-10-16 LAB — BRAIN NATRIURETIC PEPTIDE: B Natriuretic Peptide: 95 pg/mL (ref 0.0–100.0)

## 2023-10-16 MED ORDER — IOHEXOL 350 MG/ML SOLN
75.0000 mL | Freq: Once | INTRAVENOUS | Status: AC | PRN
  Administered 2023-10-16: 75 mL via INTRAVENOUS

## 2023-10-16 MED ORDER — ACETAMINOPHEN 325 MG PO TABS
650.0000 mg | ORAL_TABLET | Freq: Once | ORAL | Status: AC
  Administered 2023-10-16: 650 mg via ORAL
  Filled 2023-10-16: qty 2

## 2023-10-16 NOTE — ED Triage Notes (Signed)
Pt BIB [facility] from a fall at home. Pt denies LOC, dizziness, or headache. Pt is on Plavix blood thinner but states he did not hit his head. Pt states he is having bilateral lower back pain (flank area.) Pt has hx of chronic back pain the runs into his legs. Pt states "this pain feels difference."

## 2023-10-16 NOTE — Discharge Instructions (Signed)
They for letting us evaluate you today.  Your heart enzyme was not significant for heart injury.  Your scan of your chest without significant for pneumonia or a blood clot.  Your scans of your back are not significant for fracture.  Please use Tylenol or ibuprofen as needed for pain at home.  You may purchase lidocaine patches over-the-counter to put on areas of pain.  Please follow-up with your primary care provider regarding routine medical maintenance  Return to emergency department if you experience worsening shortness of breath, chest pain, syncope, altered mentation, saddle paresthesia, incontinence

## 2023-10-16 NOTE — ED Notes (Signed)
Placed pt on 5 lead cardiac monitor

## 2023-10-16 NOTE — ED Provider Notes (Signed)
[place] EMERGENCY DEPARTMENT AT [facility] Provider Note   CSN: [id number] Arrival date & time: 10/16/23  1237     History  No chief complaint on file.   Robert Osborne is a 77 y.o. male with past medical history of type 2 diabetes, CKD, hypertension, neoplasm of bladder, frontotemporal dementia presents to emergency department following fall today.  He reports that he was getting out of bed when he felt generally weak and slid down the bed onto the floor on his bottom.  He required his brother to help him up from the floor.  He currently complains of lower back pain.  He denies head injury, visual disturbances, LOC, paresthesia, urinary symptoms, incontinence.  He has a history of taking Plavix following coronary graft.  He reports that he has not taken this for "2 years".  He also complains of SOB and productive cough for the past two weeks. Denies fever, chills.  The history is provided by the patient and the spouse. No language interpreter was used.      Home Medications Prior to Admission medications   Medication Sig Start Date End Date Taking? Authorizing Provider  aspirin EC 81 MG EC tablet Take 1 tablet (81 mg total) by mouth daily. Swallow whole. 08/30/21   Barrett, Erin R, PA-C  blood glucose meter kit and supplies KIT Dispense based on patient and insurance preference. Use up to twice a daily as directed. (FOR ICD-10 - type 2 DM uncontrolled E11.9) 08/19/19   Johnson, Clanford L, MD  blood glucose meter kit and supplies Dispense based on patient and insurance preference. Use up to four times daily as directed. (FOR ICD-10 E10.9, E11.9). 08/23/19   Jannifer Rodney A, FNP  blood glucose meter kit and supplies Use up to four times daily as directed. dx E11.9 08/23/19   Remus Loffler, PA-C  Blood Glucose Monitoring Suppl (ONE TOUCH ULTRA 2) w/Device KIT USE TO CHECK BLOOD SUGAR UP TO 4 TIMES DAILY AS DIRECTED 05/15/20   Jannifer Rodney A, FNP  clopidogrel (PLAVIX) 75 MG  tablet Take 1 tablet (75 mg total) by mouth daily. 08/30/21   Barrett, Erin R, PA-C  dapagliflozin propanediol (FARXIGA) 10 MG TABS tablet Take 1 tablet (10 mg total) by mouth daily. 08/30/21   Barrett, Erin R, PA-C  ezetimibe (ZETIA) 10 MG tablet Take 1 tablet (10 mg total) by mouth daily. 10/07/23   Rollene Rotunda, MD  glimepiride (AMARYL) 2 MG tablet Take 2 mg by mouth daily. 07/22/22   [provider]  glucose blood test strip Use as instructed 05/15/20   Jannifer Rodney A, FNP  HYDROcodone-acetaminophen (NORCO/VICODIN) 5-325 MG tablet One tablet every six hours for pain.  Limit 7 days. 07/31/22   Darreld Mclean, MD  insulin degludec (TRESIBA FLEXTOUCH) 100 UNIT/ML FlexTouch Pen Inject 5 Units into the skin daily. 03/22/22   Azucena Fallen, MD  Insulin Pen Needle (PEN NEEDLES) 32G X 4 MM MISC 100 each by Does not apply route 4 (four) times daily. E10.9 05/16/20   Jannifer Rodney A, FNP  isosorbide mononitrate (IMDUR) 60 MG 24 hr tablet TAKE 1 TABLET BY MOUTH EVERY DAY 03/20/22   Rollene Rotunda, MD  Lancets (ONETOUCH DELICA PLUS LANCET33G) MISC USE TO CHECK BLOOD SUGAR UP TO 4 TIMES A DAY AS DIRECTED 01/23/20   Jannifer Rodney A, FNP  metoprolol tartrate (LOPRESSOR) 50 MG tablet TAKE 1 TABLET BY MOUTH TWICE A DAY 09/22/23   Rollene Rotunda, MD  Multiple Vitamin (  MULTIVITAMIN WITH MINERALS) TABS tablet Take 1 tablet by mouth daily.    [provider]  nitroGLYCERIN (NITROSTAT) 0.4 MG SL tablet Place 0.4 mg under the tongue every 5 (five) minutes as needed for chest pain.    [provider]  Nutritional Supplements (,FEEDING SUPPLEMENT, PROSOURCE PLUS) liquid Take 30 mLs by mouth 2 (two) times daily between meals. 03/22/22   Azucena Fallen, MD  OLANZapine (ZYPREXA) 5 MG tablet Take 5 mg by mouth at bedtime.    [provider]  rosuvastatin (CRESTOR) 40 MG tablet Take 1 tablet (40 mg total) by mouth daily. 08/30/21   Barrett, Erin R, PA-C  tamsulosin (FLOMAX) 0.4 MG CAPS  capsule Take 0.4 mg by mouth daily.    [provider]  traMADol (ULTRAM) 50 MG tablet Take 50 mg by mouth every 6 (six) hours as needed. 06/06/22   [provider]      Allergies    Enalapril    Review of Systems   Review of Systems  Constitutional:  Negative for chills, fatigue and fever.  Respiratory:  Positive for cough and shortness of breath. Negative for chest tightness and wheezing.   Cardiovascular:  Negative for chest pain and palpitations.  Gastrointestinal:  Negative for abdominal pain, constipation, diarrhea, nausea and vomiting.  Musculoskeletal:  Positive for back pain. Negative for neck pain.  Neurological:  Positive for weakness. Negative for dizziness, seizures, light-headedness, numbness and headaches.    Physical Exam Updated Vital Signs BP 114/68   Pulse 100   Temp 98.4 F (36.9 C) (Oral)   Resp 18   Ht 5\' 7"  (1.702 m)   Wt 68.9 kg   SpO2 95%   BMI 23.79 kg/m  Physical Exam Vitals and nursing note reviewed.  Constitutional:      General: He is not in acute distress.    Appearance: Normal appearance. He is not diaphoretic.  HENT:     Head: Normocephalic and atraumatic.     Comments: No battle sign No hematoma    Right Ear: External ear normal.     Left Ear: External ear normal.     Ears:     Comments: No hemotympanum    Nose: Nose normal.     Comments: No epistaxis nor dried blood in nostrils bilaterally No damage to tongue    Mouth/Throat:     Comments: No damage to tongue Eyes:     General:        Right eye: No discharge.        Left eye: No discharge.     Extraocular Movements: Extraocular movements intact.     Conjunctiva/sclera: Conjunctivae normal.     Pupils: Pupils are equal, round, and reactive to light.     Comments: No subconjunctival hemorrhage, hyphema, tear drop pupil, or fluid leakage bilaterally  Neck:     Vascular: No carotid bruit.     Comments: No crepitus, step-off, deformity to neck or spine TTP of  midline spinous processes in thoracic and lumbar region No masses palpated Cardiovascular:     Rate and Rhythm: Normal rate.     Pulses: Normal pulses.     Comments: Radial pulses 2+ equal bilaterally Dorsalis pedis 2+ equal bilaterally Pulmonary:     Effort: Pulmonary effort is normal. No respiratory distress.     Breath sounds: Normal breath sounds. No wheezing.  Chest:     Chest wall: No tenderness.  Abdominal:     General: Bowel sounds are normal. There  is no distension.     Palpations: Abdomen is soft.     Tenderness: There is no abdominal tenderness. There is no right CVA tenderness, left CVA tenderness or guarding.  Musculoskeletal:     Cervical back: Normal range of motion and neck supple. No rigidity or tenderness.     Right lower leg: No edema.     Left lower leg: No edema.     Comments: No obvious deformity to joints or long bones Pelvis stable with no shortening or rotation of LE bilaterally Homan's test neg bilaterally  Skin:    General: Skin is warm and dry.     Capillary Refill: Capillary refill takes less than 2 seconds.  Neurological:     General: No focal deficit present.     Mental Status: He is alert and oriented to person, place, and time. Mental status is at baseline.     GCS: GCS eye subscore is 4. GCS verbal subscore is 5. GCS motor subscore is 6.     Cranial Nerves: Cranial nerves 2-12 are intact. No cranial nerve deficit.     Sensory: Sensation is intact. No sensory deficit.     Motor: Motor function is intact. No weakness or tremor.     Coordination: Coordination is intact. Finger-Nose-Finger Test and Heel to Tolleson Test normal.     Gait: Gait is intact.     Deep Tendon Reflexes: Reflexes are normal and symmetric.     Comments: Acting following commands appropriately     ED Results / Procedures / Treatments   Labs (all labs ordered are listed, but only abnormal results are displayed) Labs Reviewed  CBC WITH DIFFERENTIAL/PLATELET - Abnormal; Notable  for the following components:      Result Value   Platelets 136 (*)    Lymphs Abs 0.6 (*)    All other components within normal limits  COMPREHENSIVE METABOLIC PANEL - Abnormal; Notable for the following components:   Potassium 5.4 (*)    Glucose, Bld 147 (*)    BUN 24 (*)    Creatinine, Ser 1.34 (*)    Calcium 8.8 (*)    AST 48 (*)    Total Bilirubin 1.3 (*)    GFR, Estimated 54 (*)    All other components within normal limits  URINALYSIS, W/ REFLEX TO CULTURE (INFECTION SUSPECTED) - Abnormal; Notable for the following components:   Protein, ur 100 (*)    All other components within normal limits  BRAIN NATRIURETIC PEPTIDE  TROPONIN I (HIGH SENSITIVITY)  TROPONIN I (HIGH SENSITIVITY)    EKG EKG Interpretation Date/Time:  Friday October 16 2023 13:15:13 EDT Ventricular Rate:  70 PR Interval:  178 QRS Duration:  142 QT Interval:  410 QTC Calculation: 443 R Axis:   21  Text Interpretation: Sinus rhythm Left bundle branch block Confirmed by Alvino Blood ([id number]) on 10/16/2023 1:35:17 PM  Radiology CT Angio Chest PE W and/or Wo Contrast  Result Date: 10/16/2023 CLINICAL DATA:  Lower back pain, fall EXAM: CT ANGIOGRAPHY CHEST WITH CONTRAST TECHNIQUE: Multidetector CT imaging of the chest was performed using the standard protocol during bolus administration of intravenous contrast. Multiplanar CT image reconstructions and MIPs were obtained to evaluate the vascular anatomy. RADIATION DOSE REDUCTION: This exam was performed according to the departmental dose-optimization program which includes automated exposure control, adjustment of the mA and/or kV according to patient size and/or use of iterative reconstruction technique. CONTRAST:  75mL OMNIPAQUE IOHEXOL 350 MG/ML SOLN COMPARISON:  None Available. FINDINGS: Cardiovascular:  No evidence of pulmonary embolus to the level of the lobar arteries. Limited evaluation of the segmental and subsegmental pulmonary arteries due to motion  artifact. Cardiomegaly. No pericardial effusion. Normal caliber thoracic aorta with moderate atherosclerotic disease. Severe coronary artery calcifications status post CABG. Mediastinum/Nodes: Small hiatal hernia. No enlarged lymph nodes seen in the chest. Lungs/Pleura: Central airways are patent. Solid pulmonary nodule of the right middle lobe measuring 3 mm on series 6, image 88. Mild linear consolidations of the bilateral lower lobes, likely atelectasis. No pleural effusion or pneumothorax. Upper Abdomen: Gallstones. Partially visualized large simple appearing left renal lesion additional smaller bilateral low-attenuation renal lesions, likely simple cysts with no specific follow-up imaging necessary. Musculoskeletal: Prior median sternotomy with intact sternal wires. No aggressive appearing osseous lesions. Review of the MIP images confirms the above findings. IMPRESSION: 1. No evidence of pulmonary embolus to the level of the lobar arteries. Limited evaluation of the segmental and subsegmental pulmonary arteries due to motion artifact. 2. Solid pulmonary nodule of the right middle lobe measuring 3 mm. No follow-up needed if patient is low-risk.This recommendation follows the consensus statement: Guidelines for Management of Incidental Pulmonary Nodules Detected on CT Images: From the Fleischner Society 2017; Radiology 2017; 284:228-243. 3. Cholelithiasis. 4. Aortic Atherosclerosis (ICD10-I70.0). Electronically Signed   By: Allegra Lai M.D.   On: 10/16/2023 21:55   DG Lumbar Spine Complete  Result Date: 10/16/2023 CLINICAL DATA:  Shortness of breath.  Fall at home. EXAM: LUMBAR SPINE - COMPLETE 4+ VIEW COMPARISON:  Lumbar spine radiographs 06/10/2022, CT chest 08/15/2021 FINDINGS: There are 5 non-rib-bearing lumbar-type vertebral bodies. Minimal mid L3 vertebral body height loss appears unchanged from 06/10/2022. Mild superior endplate concavity at L1 is unchanged to minimally worsened from 06/10/2022  radiograph. Recommend clinical correlation for point tenderness. Moderate L5-S1 facet joint arthropathy. Moderate atherosclerotic calcifications. IMPRESSION: 1. Mild superior endplate concavity at L1 is unchanged to minimally worsened from 06/10/2022 radiograph. Recommend clinical correlation for point tenderness. 2. Minimal mid L3 vertebral body height loss appears unchanged from 06/10/2022 radiograph. Electronically Signed   By: Neita Garnet M.D.   On: 10/16/2023 17:33   DG Thoracic Spine W/Swimmers  Result Date: 10/16/2023 CLINICAL DATA:  Fall at home none. Bilateral lower back pain, chronic. EXAM: THORACIC SPINE - 3 VIEWS COMPARISON:  Thoracic spine radiographs 06/10/2022 FINDINGS: Status post median sternotomy and CABG. Moderate atherosclerotic calcifications within the aortic arch. There is diffuse decreased bone mineralization. Mild-to-moderate multilevel degenerative disc changes of the thoracic spine. Mild multilevel anterior endplate spurs. Vertebral body heights appear maintained. IMPRESSION: Mild-to-moderate multilevel degenerative disc changes of the thoracic spine. Electronically Signed   By: Neita Garnet M.D.   On: 10/16/2023 17:28   DG Chest 2 View  Result Date: 10/16/2023 CLINICAL DATA:  Shortness of breath. Fall at home. Bilateral flank pain. EXAM: CHEST - 2 VIEW COMPARISON:  Chest radiographs 03/17/2022 and 10/21/2021 FINDINGS: Status post median sternotomy and CABG. Cardiac silhouette is again mildly enlarged. Mediastinal contours are within normal limits. Moderate to high-grade atherosclerotic calcifications. Horizontal linear bibasilar scarring is similar to prior. No definite pleural effusion. No pneumothorax. IMPRESSION: 1. No acute cardiopulmonary process. 2. Mild cardiomegaly. Electronically Signed   By: Neita Garnet M.D.   On: 10/16/2023 17:25    Procedures Procedures    Medications Ordered in ED Medications  iohexol (OMNIPAQUE) 350 MG/ML injection 75 mL (75 mLs  Intravenous Contrast Given 10/16/23 2032)  acetaminophen (TYLENOL) tablet 650 mg (650 mg Oral Given 10/16/23 2108)  ED Course/ Medical Decision Making/ A&P                                 Medical Decision Making Amount and/or Complexity of Data Reviewed Labs: ordered. Radiology: ordered.  Risk OTC drugs. Prescription drug management.   Patient presents to the ED for concern of lower back pain following fall, this involves an extensive number of treatment options, and is a complaint that carries with it a high risk of complications and morbidity.  The differential diagnosis includes ACS, electrolyte abnormality, PE, acute on chronic CHF, ICH.  This is not an exhaustive list    Additional history obtained:  Additional history obtained from EMS, Family, Nursing, Outside Medical Records, and Past Admission   External records from outside source obtained and reviewed   Lab Tests:  I Ordered, and personally interpreted labs.  The pertinent results include:  neg trop x2, no acute electrolyte abnormality requiring ED intervention, BNP WNL   Imaging Studies ordered:  I ordered imaging studies including CT PA  I independently visualized and interpreted imaging which showed no PE, PNA I agree with the radiologist interpretation   Cardiac Monitoring:  The patient was maintained on a cardiac monitor.  I personally viewed and interpreted the cardiac monitored which showed an underlying rhythm of: NSR with LBBB   Medicines ordered and prescription drug management:  I ordered medication including Tylenol  for back pain  Reevaluation of the patient after these medicines showed that the patient improved I have reviewed the patients home medicines and have made adjustments as needed     Problem List / ED Course:  Low back pain SOB   Reevaluation:  After the interventions noted above, I reevaluated the patient and found that they have :stayed the  same   Dispostion:  Patient is resting comfortably in bed with mild tachypnea at 20 noted.  He reports that he has had shortness of breath over the past 2 weeks with cough.  No fever.  Today, he seeks evaluation at ED for evaluation of weakness causing him to slide down bed onto his bottom with associated back pain.  He denies head injury, headache, LOC.  See HPI.  Upon evaluation, patient has pain to the spinous processes of thoracic and lumbar region.  Provide Tylenol for pain management.  He also complains of shortness of breath with.  Lung sounds CTAB with no wheezing nor Rales.  He reports that he does not wear oxygen at home.  He maintains his O2 sat at 95 to 98%.  EKG NSR with LBBB.  X-rays negative for acute fracture or misalignment of spine.  While tech is attempting to ambulate the patient, patient desats to 88% on room air.  Will obtain CTPA to rule out PE or pneumonia.  Lab work is significant for negative Trop x 2, no leukocytosis, electrolyte abnormality requiring ED intervention.  UA negative for acute infection.  CTPA is negative for PE and pneumonia.  Upon chart review, patient has been complaining of weakness of his LE's bilaterally since 01/15/2023 with his PCP.  This does not appear to be a new problem.  I personally ambulated patient with O2 sat monitoring and patient remains above 95% with good pleth wave.  With reassuring workup, negative PE study, no pneumonia, it is likely that patient's "desat" was likely due to poor pleth wave.  Patient reports that he does not currently feel short of  breath and is ready to be discharged  After consideration of the diagnostic results and the patients response to treatment, I feel that the patent would benefit from outpatient management and follow up with PCP regarding routine med maintenance.  I discussed findings, imaging, workup, disposition, return to emergency room precautions to patient and patient's wife expressed understanding agree  with plan.  I discussed using Tylenol ibuprofen as needed for pain.  Return to emergency department precautions include worsening shortness of breath, chest pain, syncope, altered mentation, saddle paresthesia, incontinence.  Their questions were answered to their satisfaction and they agree with plan.         Final Clinical Impression(s) / ED Diagnoses Final diagnoses:  Weakness of both lower extremities  Acute midline low back pain without sciatica  SOB (shortness of breath) on exertion    Rx / DC Orders ED Discharge Orders     None         Judithann Sheen, PA 10/16/23 2358    Lonell Grandchild, MD 10/17/23 [phone]

## 2023-10-17 NOTE — ED Notes (Signed)
Called wife to pick up pt.

## 2023-10-17 NOTE — ED Notes (Signed)
Son here to pick up pt

## 2023-10-18 ENCOUNTER — Encounter (HOSPITAL_COMMUNITY): Payer: Self-pay

## 2023-10-18 ENCOUNTER — Emergency Department (HOSPITAL_COMMUNITY)
Admission: EM | Admit: 2023-10-18 | Discharge: 2023-10-21 | Disposition: A | Discharge status: Home / Self Care | Payer: Medicare HMO | Attending: Emergency Medicine | Admitting: Emergency Medicine

## 2023-10-18 ENCOUNTER — Other Ambulatory Visit: Payer: Self-pay

## 2023-10-18 DIAGNOSIS — M6281 Muscle weakness (generalized): Secondary | ICD-10-CM | POA: Diagnosis not present

## 2023-10-18 DIAGNOSIS — E119 Type 2 diabetes mellitus without complications: Secondary | ICD-10-CM | POA: Insufficient documentation

## 2023-10-18 DIAGNOSIS — Z7902 Long term (current) use of antithrombotics/antiplatelets: Secondary | ICD-10-CM | POA: Insufficient documentation

## 2023-10-18 DIAGNOSIS — Z794 Long term (current) use of insulin: Secondary | ICD-10-CM | POA: Diagnosis not present

## 2023-10-18 DIAGNOSIS — Z8551 Personal history of malignant neoplasm of bladder: Secondary | ICD-10-CM | POA: Insufficient documentation

## 2023-10-18 DIAGNOSIS — R2689 Other abnormalities of gait and mobility: Secondary | ICD-10-CM | POA: Insufficient documentation

## 2023-10-18 DIAGNOSIS — Z7984 Long term (current) use of oral hypoglycemic drugs: Secondary | ICD-10-CM | POA: Diagnosis not present

## 2023-10-18 DIAGNOSIS — Z7982 Long term (current) use of aspirin: Secondary | ICD-10-CM | POA: Diagnosis not present

## 2023-10-18 DIAGNOSIS — Z951 Presence of aortocoronary bypass graft: Secondary | ICD-10-CM | POA: Diagnosis not present

## 2023-10-18 DIAGNOSIS — M545 Low back pain, unspecified: Secondary | ICD-10-CM | POA: Insufficient documentation

## 2023-10-18 MED ORDER — ROSUVASTATIN CALCIUM 20 MG PO TABS
40.0000 mg | ORAL_TABLET | Freq: Every day | ORAL | Status: DC
  Administered 2023-10-18 – 2023-10-21 (×4): 40 mg via ORAL
  Filled 2023-10-18 (×4): qty 2

## 2023-10-18 MED ORDER — INSULIN DEGLUDEC 100 UNIT/ML ~~LOC~~ SOPN: 5.0000 [IU] | PEN_INJECTOR | Freq: Every day | SUBCUTANEOUS | Status: DC

## 2023-10-18 MED ORDER — TRAMADOL HCL 50 MG PO TABS
50.0000 mg | ORAL_TABLET | Freq: Four times a day (QID) | ORAL | Status: DC | PRN
  Administered 2023-10-18 – 2023-10-20 (×7): 50 mg via ORAL
  Filled 2023-10-18 (×7): qty 1

## 2023-10-18 MED ORDER — ADULT MULTIVITAMIN W/MINERALS CH
1.0000 | ORAL_TABLET | Freq: Every day | ORAL | Status: DC
  Administered 2023-10-18 – 2023-10-21 (×4): 1 via ORAL
  Filled 2023-10-18 (×4): qty 1

## 2023-10-18 MED ORDER — ASPIRIN 81 MG PO TBEC
81.0000 mg | DELAYED_RELEASE_TABLET | Freq: Every day | ORAL | Status: DC
  Administered 2023-10-18 – 2023-10-21 (×4): 81 mg via ORAL
  Filled 2023-10-18 (×4): qty 1

## 2023-10-18 MED ORDER — OLANZAPINE 5 MG PO TABS
5.0000 mg | ORAL_TABLET | Freq: Every day | ORAL | Status: DC
  Administered 2023-10-18 – 2023-10-20 (×3): 5 mg via ORAL
  Filled 2023-10-18 (×3): qty 1

## 2023-10-18 MED ORDER — EZETIMIBE 10 MG PO TABS
10.0000 mg | ORAL_TABLET | Freq: Every day | ORAL | Status: DC
  Administered 2023-10-18 – 2023-10-21 (×4): 10 mg via ORAL
  Filled 2023-10-18 (×4): qty 1

## 2023-10-18 MED ORDER — METOPROLOL TARTRATE 50 MG PO TABS
50.0000 mg | ORAL_TABLET | Freq: Two times a day (BID) | ORAL | Status: DC
  Administered 2023-10-18 – 2023-10-21 (×7): 50 mg via ORAL
  Filled 2023-10-18 (×7): qty 1

## 2023-10-18 MED ORDER — ISOSORBIDE MONONITRATE ER 60 MG PO TB24
60.0000 mg | ORAL_TABLET | Freq: Every day | ORAL | Status: DC
  Administered 2023-10-18 – 2023-10-21 (×4): 60 mg via ORAL
  Filled 2023-10-18 (×4): qty 1

## 2023-10-18 MED ORDER — GLIMEPIRIDE 2 MG PO TABS
2.0000 mg | ORAL_TABLET | Freq: Every day | ORAL | Status: DC
  Administered 2023-10-19 – 2023-10-21 (×3): 2 mg via ORAL
  Filled 2023-10-18 (×5): qty 1

## 2023-10-18 MED ORDER — NITROGLYCERIN 0.4 MG SL SUBL: 0.4000 mg | SUBLINGUAL_TABLET | SUBLINGUAL | Status: DC | PRN

## 2023-10-18 MED ORDER — CLOPIDOGREL BISULFATE 75 MG PO TABS
75.0000 mg | ORAL_TABLET | Freq: Every day | ORAL | Status: DC
  Administered 2023-10-18 – 2023-10-21 (×4): 75 mg via ORAL
  Filled 2023-10-18 (×4): qty 1

## 2023-10-18 MED ORDER — DAPAGLIFLOZIN PROPANEDIOL 10 MG PO TABS
10.0000 mg | ORAL_TABLET | Freq: Every day | ORAL | Status: DC
  Administered 2023-10-19 – 2023-10-21 (×3): 10 mg via ORAL
  Filled 2023-10-18 (×5): qty 1

## 2023-10-18 MED ORDER — INSULIN GLARGINE-YFGN 100 UNIT/ML ~~LOC~~ SOLN
5.0000 [IU] | Freq: Every day | SUBCUTANEOUS | Status: DC
  Administered 2023-10-18 – 2023-10-20 (×3): 5 [IU] via SUBCUTANEOUS
  Filled 2023-10-18 (×5): qty 0.05
  Filled 2023-10-18: qty 10

## 2023-10-18 NOTE — ED Notes (Signed)
Changed pt pants and underwear out to diaper and changed bed linens.

## 2023-10-18 NOTE — ED Triage Notes (Signed)
BIB EMS, says pt d/c yesterday from hospital with UTI. Pt states his lower back began hurting worse overnight and this am. EMS states he was given Ibuprofen 800mg  in transport and pt states it is helping. EMS also states before leaving that wife wants placement for the pt as she is unable to care for him currently at home.

## 2023-10-18 NOTE — ED Notes (Signed)
Pt resting comfortably at this time. Pt has difficulty moving lower extremities, but does report having sensation in all extremities. Pt endorses muscle weakness, and difficulty with ambulation but feels PT/OT will benefit his progress.

## 2023-10-18 NOTE — ED Notes (Signed)
Pt called out and requested urinal. Pt given urinal.

## 2023-10-18 NOTE — ED Provider Notes (Signed)
Robert Osborne EMERGENCY DEPARTMENT AT [facility] Provider Note   CSN: [id number] Arrival date & time: 10/18/23  1053     History  Chief Complaint  Patient presents with   Back Pain    Robert Osborne is a 77 y.o. male history of diabetes, bladder cancer, frontotemporal neurocognitive disorder, NSTEMI, CABG x 4, diabetic polyneuropathy, chronic pain presented for low back pain that has been present since a mechanical fall 3 days ago.  Patient came in and was evaluated in the ER and had reassuring workup however since then has not been walking around due to pain in his low back.  Patient's been using ibuprofen along with lidocaine patches to no relief.  Patient denies any saddle anesthesia, bowel/bladder incontinence, paresthesias that are new.  Patient states that he thinks he has a UTI based on his urine sample from previous ER visit but denies any dysuria, hematuria or urinary symptoms, flank pain, fevers, abdominal pain.  Wife also wants help with placement as she states she is unable to care for him at home given his medical needs.  Patient denies chest pain, shortness of breath, changes in vision, fevers, recent illness  Home Medications Prior to Admission medications   Medication Sig Start Date End Date Taking? Authorizing Provider  aspirin EC 81 MG EC tablet Take 1 tablet (81 mg total) by mouth daily. Swallow whole. 08/30/21   Barrett, Erin R, PA-C  blood glucose meter kit and supplies KIT Dispense based on patient and insurance preference. Use up to twice a daily as directed. (FOR ICD-10 - type 2 DM uncontrolled E11.9) 08/19/19   Johnson, Clanford L, MD  blood glucose meter kit and supplies Dispense based on patient and insurance preference. Use up to four times daily as directed. (FOR ICD-10 E10.9, E11.9). 08/23/19   Jannifer Rodney A, FNP  blood glucose meter kit and supplies Use up to four times daily as directed. dx E11.9 08/23/19   Remus Loffler, PA-C  Blood Glucose Monitoring  Suppl (ONE TOUCH ULTRA 2) w/Device KIT USE TO CHECK BLOOD SUGAR UP TO 4 TIMES DAILY AS DIRECTED 05/15/20   Jannifer Rodney A, FNP  clopidogrel (PLAVIX) 75 MG tablet Take 1 tablet (75 mg total) by mouth daily. 08/30/21   Barrett, Erin R, PA-C  dapagliflozin propanediol (FARXIGA) 10 MG TABS tablet Take 1 tablet (10 mg total) by mouth daily. 08/30/21   Barrett, Erin R, PA-C  ezetimibe (ZETIA) 10 MG tablet Take 1 tablet (10 mg total) by mouth daily. 10/07/23   Rollene Rotunda, MD  glimepiride (AMARYL) 2 MG tablet Take 2 mg by mouth daily. 07/22/22   [provider]  glucose blood test strip Use as instructed 05/15/20   Jannifer Rodney A, FNP  HYDROcodone-acetaminophen (NORCO/VICODIN) 5-325 MG tablet One tablet every six hours for pain.  Limit 7 days. 07/31/22   Darreld Mclean, MD  insulin degludec (TRESIBA FLEXTOUCH) 100 UNIT/ML FlexTouch Pen Inject 5 Units into the skin daily. 03/22/22   Azucena Fallen, MD  Insulin Pen Needle (PEN NEEDLES) 32G X 4 MM MISC 100 each by Does not apply route 4 (four) times daily. E10.9 05/16/20   Jannifer Rodney A, FNP  isosorbide mononitrate (IMDUR) 60 MG 24 hr tablet TAKE 1 TABLET BY MOUTH EVERY DAY 03/20/22   Rollene Rotunda, MD  Lancets [facility] DELICA PLUS LANCET33G) MISC USE TO CHECK BLOOD SUGAR UP TO 4 TIMES A DAY AS DIRECTED 01/23/20   Junie Spencer, FNP  metoprolol tartrate (LOPRESSOR)  50 MG tablet TAKE 1 TABLET BY MOUTH TWICE A DAY 09/22/23   Rollene Rotunda, MD  Multiple Vitamin (MULTIVITAMIN WITH MINERALS) TABS tablet Take 1 tablet by mouth daily.    [provider]  nitroGLYCERIN (NITROSTAT) 0.4 MG SL tablet Place 0.4 mg under the tongue every 5 (five) minutes as needed for chest pain.    [provider]  Nutritional Supplements (,FEEDING SUPPLEMENT, PROSOURCE PLUS) liquid Take 30 mLs by mouth 2 (two) times daily between meals. 03/22/22   Azucena Fallen, MD  OLANZapine (ZYPREXA) 5 MG tablet Take 5 mg by mouth at bedtime.    [provider]  rosuvastatin (CRESTOR) 40 MG tablet Take 1 tablet (40 mg total) by mouth daily. 08/30/21   Barrett, Erin R, PA-C  tamsulosin (FLOMAX) 0.4 MG CAPS capsule Take 0.4 mg by mouth daily.    [provider]  traMADol (ULTRAM) 50 MG tablet Take 50 mg by mouth every 6 (six) hours as needed. 06/06/22   [provider]      Allergies    Enalapril    Review of Systems   Review of Systems  Musculoskeletal:  Positive for back pain.    Physical Exam Updated Vital Signs BP (!) 152/106   Pulse 67   Temp 98 F (36.7 C) (Oral)   Resp 18   Ht 5\' 7"  (1.702 m)   Wt 68 kg   SpO2 96%   BMI 23.49 kg/m  Physical Exam Vitals reviewed.  Constitutional:      General: He is not in acute distress. HENT:     Head: Normocephalic and atraumatic.  Eyes:     Extraocular Movements: Extraocular movements intact.     Conjunctiva/sclera: Conjunctivae normal.     Pupils: Pupils are equal, round, and reactive to light.  Cardiovascular:     Rate and Rhythm: Normal rate and regular rhythm.     Pulses: Normal pulses.     Heart sounds: Normal heart sounds.     Comments: 2+ bilateral radial/dorsalis pedis pulses with regular rate Pulmonary:     Effort: Pulmonary effort is normal. No respiratory distress.     Breath sounds: Normal breath sounds.  Abdominal:     Palpations: Abdomen is soft.     Tenderness: There is no abdominal tenderness. There is no guarding or rebound.  Musculoskeletal:        General: Normal range of motion.     Cervical back: Normal range of motion and neck supple.     Comments: 5 out of 5 bilateral grip strength 4 out of 5 bilateral hip flexion Lidocaine patch noted to right low back No midline tenderness or abnormalities palpated Unable to reproduce patient's back pain when palpating the paralumbar musculature Pelvis was stable and no pain  Skin:    General: Skin is warm and dry.     Capillary Refill: Capillary refill takes less than 2 seconds.   Neurological:     General: No focal deficit present.     Mental Status: He is alert and oriented to person, place, and time.     Sensory: Sensation is intact.     Motor: Motor function is intact.     Coordination: Coordination is intact.     Comments: Sensation intact in all 4 limbs Able to assess gait due to back pain Vision grossly intact Cranial nerves III through XII intact  Psychiatric:        Mood and Affect: Mood normal.  ED Results / Procedures / Treatments   Labs (all labs ordered are listed, but only abnormal results are displayed) Labs Reviewed - No data to display  EKG None  Radiology CT Angio Chest PE W and/or Wo Contrast  Result Date: 10/16/2023 CLINICAL DATA:  Lower back pain, fall EXAM: CT ANGIOGRAPHY CHEST WITH CONTRAST TECHNIQUE: Multidetector CT imaging of the chest was performed using the standard protocol during bolus administration of intravenous contrast. Multiplanar CT image reconstructions and MIPs were obtained to evaluate the vascular anatomy. RADIATION DOSE REDUCTION: This exam was performed according to the departmental dose-optimization program which includes automated exposure control, adjustment of the mA and/or kV according to patient size and/or use of iterative reconstruction technique. CONTRAST:  75mL OMNIPAQUE IOHEXOL 350 MG/ML SOLN COMPARISON:  None Available. FINDINGS: Cardiovascular: No evidence of pulmonary embolus to the level of the lobar arteries. Limited evaluation of the segmental and subsegmental pulmonary arteries due to motion artifact. Cardiomegaly. No pericardial effusion. Normal caliber thoracic aorta with moderate atherosclerotic disease. Severe coronary artery calcifications status post CABG. Mediastinum/Nodes: Small hiatal hernia. No enlarged lymph nodes seen in the chest. Lungs/Pleura: Central airways are patent. Solid pulmonary nodule of the right middle lobe measuring 3 mm on series 6, image 88. Mild linear consolidations of the  bilateral lower lobes, likely atelectasis. No pleural effusion or pneumothorax. Upper Abdomen: Gallstones. Partially visualized large simple appearing left renal lesion additional smaller bilateral low-attenuation renal lesions, likely simple cysts with no specific follow-up imaging necessary. Musculoskeletal: Prior median sternotomy with intact sternal wires. No aggressive appearing osseous lesions. Review of the MIP images confirms the above findings. IMPRESSION: 1. No evidence of pulmonary embolus to the level of the lobar arteries. Limited evaluation of the segmental and subsegmental pulmonary arteries due to motion artifact. 2. Solid pulmonary nodule of the right middle lobe measuring 3 mm. No follow-up needed if patient is low-risk.This recommendation follows the consensus statement: Guidelines for Management of Incidental Pulmonary Nodules Detected on CT Images: From the Fleischner Society 2017; Radiology 2017; 284:228-243. 3. Cholelithiasis. 4. Aortic Atherosclerosis (ICD10-I70.0). Electronically Signed   By: Allegra Lai M.D.   On: 10/16/2023 21:55   DG Lumbar Spine Complete  Result Date: 10/16/2023 CLINICAL DATA:  Shortness of breath.  Fall at home. EXAM: LUMBAR SPINE - COMPLETE 4+ VIEW COMPARISON:  Lumbar spine radiographs 06/10/2022, CT chest 08/15/2021 FINDINGS: There are 5 non-rib-bearing lumbar-type vertebral bodies. Minimal mid L3 vertebral body height loss appears unchanged from 06/10/2022. Mild superior endplate concavity at L1 is unchanged to minimally worsened from 06/10/2022 radiograph. Recommend clinical correlation for point tenderness. Moderate L5-S1 facet joint arthropathy. Moderate atherosclerotic calcifications. IMPRESSION: 1. Mild superior endplate concavity at L1 is unchanged to minimally worsened from 06/10/2022 radiograph. Recommend clinical correlation for point tenderness. 2. Minimal mid L3 vertebral body height loss appears unchanged from 06/10/2022 radiograph.  Electronically Signed   By: Neita Garnet M.D.   On: 10/16/2023 17:33   DG Thoracic Spine W/Swimmers  Result Date: 10/16/2023 CLINICAL DATA:  Fall at home none. Bilateral lower back pain, chronic. EXAM: THORACIC SPINE - 3 VIEWS COMPARISON:  Thoracic spine radiographs 06/10/2022 FINDINGS: Status post median sternotomy and CABG. Moderate atherosclerotic calcifications within the aortic arch. There is diffuse decreased bone mineralization. Mild-to-moderate multilevel degenerative disc changes of the thoracic spine. Mild multilevel anterior endplate spurs. Vertebral body heights appear maintained. IMPRESSION: Mild-to-moderate multilevel degenerative disc changes of the thoracic spine. Electronically Signed   By: Neita Garnet M.D.   On: 10/16/2023 17:28  DG Chest 2 View  Result Date: 10/16/2023 CLINICAL DATA:  Shortness of breath. Fall at home. Bilateral flank pain. EXAM: CHEST - 2 VIEW COMPARISON:  Chest radiographs 03/17/2022 and 10/21/2021 FINDINGS: Status post median sternotomy and CABG. Cardiac silhouette is again mildly enlarged. Mediastinal contours are within normal limits. Moderate to high-grade atherosclerotic calcifications. Horizontal linear bibasilar scarring is similar to prior. No definite pleural effusion. No pneumothorax. IMPRESSION: 1. No acute cardiopulmonary process. 2. Mild cardiomegaly. Electronically Signed   By: Neita Garnet M.D.   On: 10/16/2023 17:25    Procedures Procedures    Medications Ordered in ED Medications  aspirin EC tablet 81 mg (has no administration in time range)  clopidogrel (PLAVIX) tablet 75 mg (has no administration in time range)  dapagliflozin propanediol (FARXIGA) tablet 10 mg (has no administration in time range)  ezetimibe (ZETIA) tablet 10 mg (has no administration in time range)  glimepiride (AMARYL) tablet 2 mg (has no administration in time range)  insulin degludec (TRESIBA) 100 UNIT/ML FlexTouch Pen 5 Units (has no administration in time range)   isosorbide mononitrate (IMDUR) 24 hr tablet 60 mg (has no administration in time range)  metoprolol tartrate (LOPRESSOR) tablet 50 mg (has no administration in time range)  nitroGLYCERIN (NITROSTAT) SL tablet 0.4 mg (has no administration in time range)  OLANZapine (ZYPREXA) tablet 5 mg (has no administration in time range)  rosuvastatin (CRESTOR) tablet 40 mg (has no administration in time range)  traMADol (ULTRAM) tablet 50 mg (has no administration in time range)  multivitamin with minerals tablet 1 tablet (has no administration in time range)    ED Course/ Medical Decision Making/ A&P                                 Medical Decision Making  BURNIS HALLING 77 y.o. presented today for back pain. Working DDx that I considered at this time includes, but not limited to, MSK, underlying fracture, epidural hematoma/abscess, cauda equina syndrome, spinal stenosis, spinal malignancy, discitis, spinal infection, spondylitises/ spondylosis, conus medullaris, DDD of the back.  R/o DDx: underlying fracture, epidural hematoma/abscess, cauda equina syndrome, spinal stenosis, spinal malignancy, discitis, spinal infection, spondylitises/ spondylosis, conus medullaris, DDD of the back : less likely due to history of present illness, physical exam, labs/imaging findings.  Review of prior external notes: 10/16/2023 ED  Unique Tests and My Interpretation: none  Social Determinants of Health: none  Discussion with Independent Historian:  Wife  Discussion of Management of Tests: None  Risk: Low: based on diagnostic testing/clinical impression and treatment plan  Risk Stratification Score: None  Staffed with Rhunette Croft, MD  Plan: On exam patient was in no acute distress with stable vitals.  I was unable to reproduce patient's back pain with palpation however I did not palpate any bony abnormalities and patient had stable pelvis on exam.  Unfortunately do not have CT here today otherwise I would CT  the lumbar spine and pelvis due to patient's persistent pain.  When speaking to the wife wife states that she wants SNF placement form as he has had this in the past and benefited as she does not feel she can care for him at home.  I spoke to the attending and given that there are no neurologic deficits on exam and patient had reassuring recent MRI of spine that does not show mets patient does not need imaging at this time I would include MRI as we  do not have CT today and patient already had negative x-rays. MRI results are as follows for 03/09/23:  Cervical:   1. Cervical degenerative disc disease most significant at C3-4 and C4-5 with disc osteophyte causing moderate canal stenosis with mild to moderate neuroforaminal narrowing as above.   2.  C5-6 mild canal stenosis.    Thoracic:   1. Mild remote T4 compression deformity. No acute fractures.   2.  No significant central canal ste   Will place patient TOC boarder status along with [facility] consult for SNF placement.  Patient's home meds were reordered.  Patient stable at this time.  This chart was dictated using voice recognition software.  Despite best efforts to proofread,  errors can occur which can change the documentation meaning.         Final Clinical Impression(s) / ED Diagnoses Final diagnoses:  Acute right-sided low back pain without sciatica    Rx / DC Orders ED Discharge Orders     None         Remi Deter 10/18/23 1308    Derwood Kaplan, MD 10/22/23 1244

## 2023-10-19 LAB — BASIC METABOLIC PANEL
Anion gap: 8 (ref 5–15)
BUN: 26 mg/dL — ABNORMAL HIGH (ref 8–23)
CO2: 21 mmol/L — ABNORMAL LOW (ref 22–32)
Calcium: 8.2 mg/dL — ABNORMAL LOW (ref 8.9–10.3)
Chloride: 110 mmol/L (ref 98–111)
Creatinine, Ser: 1.32 mg/dL — ABNORMAL HIGH (ref 0.61–1.24)
GFR, Estimated: 55 mL/min — ABNORMAL LOW (ref >60)
Glucose, Bld: 83 mg/dL (ref 70–99)
Potassium: 4 mmol/L (ref 3.5–5.1)
Sodium: 139 mmol/L (ref 135–145)

## 2023-10-19 NOTE — Evaluation (Signed)
Physical Therapy Evaluation Patient Details Name: Robert Osborne MRN: [id number] DOB: [birth year] Today's Date: 10/19/2023  History of Present Illness  Robert Osborne is a 77 y.o. male history of diabetes, bladder cancer, frontotemporal neurocognitive disorder, NSTEMI, CABG x 4, diabetic polyneuropathy, chronic pain presented for low back pain that has been present since a mechanical fall 3 days ago.  Patient came in and was evaluated in the ER and had reassuring workup however since then has not been walking around due to pain in his low back.  Patient's been using ibuprofen along with lidocaine patches to no relief.  Patient denies any saddle anesthesia, bowel/bladder incontinence, paresthesias that are new.  Patient states that he thinks he has a UTI based on his urine sample from previous ER visit but denies any dysuria, hematuria or urinary symptoms, flank pain, fevers, abdominal pain.  Wife also wants help with placement as she states she is unable to care for him at home given his medical needs.   Clinical Impression  Patient demonstrates slow labored movement for sitting up at bedside, unsteady on feet and limited to a few steps forward/backwards at bedside before having to sit due to fatigue, BLE weakness and fall risk.  Patient tolerated sitting up in chair after therapy. Patient will benefit from continued skilled physical therapy in hospital and recommended venue below to increase strength, balance, endurance for safe ADLs and gait.           If plan is discharge home, recommend the following: A lot of help with walking and/or transfers;A lot of help with bathing/dressing/bathroom;Assistance with cooking/housework;Help with stairs or ramp for entrance   Can travel by private vehicle   Yes    Equipment Recommendations Standard walker;None recommended by PT  Recommendations for Other Services       Functional Status Assessment Patient has had a recent decline in their functional  status and demonstrates the ability to make significant improvements in function in a reasonable and predictable amount of time.     Precautions / Restrictions Precautions Precautions: Fall Restrictions Weight Bearing Restrictions: No      Mobility  Bed Mobility Overal bed mobility: Needs Assistance Bed Mobility: Supine to Sit     Supine to sit: Min assist, Mod assist     General bed mobility comments: increased time, labored movement    Transfers Overall transfer level: Needs assistance Equipment used: Rolling walker (2 wheels) Transfers: Sit to/from Stand, Bed to chair/wheelchair/BSC Sit to Stand: Min assist, Mod assist   Step pivot transfers: Mod assist            Ambulation/Gait Ambulation/Gait assistance: Mod assist Gait Distance (Feet): 10 Feet Assistive device: Rolling walker (2 wheels) Gait Pattern/deviations: Decreased step length - right, Decreased step length - left, Decreased stride length Gait velocity: slow     General Gait Details: slow labored cadence, limied mostly due to c/o fatigue and BLE weakness  Stairs            Wheelchair Mobility     Tilt Bed    Modified Rankin (Stroke Patients Only)       Balance Overall balance assessment: Needs assistance Sitting-balance support: Feet supported, No upper extremity supported Sitting balance-Leahy Scale: Fair Sitting balance - Comments: fair/good seated at EOB   Standing balance support: Reliant on assistive device for balance, During functional activity, Bilateral upper extremity supported Standing balance-Leahy Scale: Poor Standing balance comment: fair/poor using RW  Pertinent Vitals/Pain Pain Assessment Pain Assessment: Faces Faces Pain Scale: Hurts a little bit Pain Location: low back Pain Intervention(s): Limited activity within patient's tolerance, Monitored during session, Repositioned    Home Living Family/patient expects to be  discharged to:: Private residence Living Arrangements: Spouse/significant other Available Help at Discharge: Family;Available PRN/intermittently Type of Home: House Home Access: Stairs to enter Entrance Stairs-Rails: None Entrance Stairs-Number of Steps: 2   Home Layout: One level Home Equipment: Agricultural consultant (2 wheels);Rollator (4 wheels);BSC/3in1;Cane - single point      Prior Function Prior Level of Function : Needs assist       Physical Assist : Mobility (physical);ADLs (physical) Mobility (physical): Bed mobility;Transfers;Gait;Stairs   Mobility Comments: household ambulator using RW ADLs Comments: assisted by family     Extremity/Trunk Assessment   Upper Extremity Assessment Upper Extremity Assessment: Generalized weakness    Lower Extremity Assessment Lower Extremity Assessment: Generalized weakness    Cervical / Trunk Assessment Cervical / Trunk Assessment: Normal  Communication   Communication Communication: No apparent difficulties Cueing Techniques: Verbal cues;Tactile cues  Cognition Arousal: Alert Behavior During Therapy: WFL for tasks assessed/performed Overall Cognitive Status: Within Functional Limits for tasks assessed                                          General Comments      Exercises     Assessment/Plan    PT Assessment Patient needs continued PT services  PT Problem List Decreased strength;Decreased activity tolerance;Decreased balance;Decreased mobility       PT Treatment Interventions DME instruction;Gait training;Stair training;Functional mobility training;Therapeutic activities;Therapeutic exercise;Balance training;Patient/family education    PT Goals (Current goals can be found in the Care Plan section)  Acute Rehab PT Goals Patient Stated Goal: return home after rehab PT Goal Formulation: With patient Time For Goal Achievement: 11/02/23 Potential to Achieve Goals: Good    Frequency Min 2X/week      Co-evaluation               AM-PAC PT "6 Clicks" Mobility  Outcome Measure Help needed turning from your back to your side while in a flat bed without using bedrails?: A Little Help needed moving from lying on your back to sitting on the side of a flat bed without using bedrails?: A Lot Help needed moving to and from a bed to a chair (including a wheelchair)?: A Lot Help needed standing up from a chair using your arms (e.g., wheelchair or bedside chair)?: A Lot Help needed to walk in hospital room?: A Lot Help needed climbing 3-5 steps with a railing? : A Lot 6 Click Score: 13    End of Session   Activity Tolerance: Patient tolerated treatment well;Patient limited by fatigue Patient left: in chair;with call bell/phone within reach Nurse Communication: Mobility status PT Visit Diagnosis: Unsteadiness on feet (R26.81);Other abnormalities of gait and mobility (R26.89);Muscle weakness (generalized) (M62.81)    Time: 9528-4132 PT Time Calculation (min) (ACUTE ONLY): 24 min   Charges:   PT Evaluation $PT Eval Moderate Complexity: 1 Mod PT Treatments $Therapeutic Activity: 23-37 mins PT General Charges $$ ACUTE PT VISIT: 1 Visit         12:25 PM, 10/19/23 Ocie Bob, MPT Physical Therapist with [facility] [phone] office [phone] mobile phone

## 2023-10-19 NOTE — ED Provider Notes (Signed)
Emergency Medicine Observation Re-evaluation Note  Robert Osborne is a 77 y.o. male, seen on rounds today.  Pt initially presented to the ED for complaints of chronic back pain, physical deconditioning, and resultant impaired mobility/falls. Pt has been in ED awaiting TOC eval for possible SNF placement. No new c/o this AM.   Physical Exam  BP 129/75   Pulse 66   Temp 97.9 F (36.6 C)   Resp 20   Ht 1.702 m (5\' 7" )   Wt 68 kg   SpO2 94%   BMI 23.49 kg/m  Physical Exam General: nad.  Cardiac: regular rate.  Lungs: breathing comfortably.   ED Course / MDM    I have reviewed the labs performed to date as well as medications administered while in observation.  Recent changes in the last 24 hours include TOC eval/placement.   Plan  Current plan is for TOC eval, placement.     Cathren Laine, MD 10/19/23 [phone]

## 2023-10-19 NOTE — TOC Progression Note (Signed)
Transition of Care [facility]) - Progression Note    Patient Details  Name: Robert Osborne MRN: [id number] Date of Birth: 11/01/1946  Transition of Care [facility]) CM/SW Contact  Leitha Bleak, RN Phone Number: 10/19/2023, 4:38 PM  Clinical Narrative:   CM called to review bed offers. Wife is reviewing [email or web address]  She requested CM call [facility], [facility].  CM left a message with SW in admissions. Will follow up in the morning.     Expected Discharge Plan: Skilled Nursing Facility Barriers to Discharge: Continued Medical Work up  Expected Discharge Plan and Services     Post Acute Care Choice: Skilled Nursing Facility Living arrangements for the past 2 months: Single Family Home                                       Social Determinants of Health (SDOH) Interventions SDOH Screenings   Food Insecurity: No Food Insecurity (05/14/2023)   Received from [facility]  Housing: Low Risk  (11/18/2022)  Transportation Needs: No Transportation Needs (05/14/2023)   Received from [facility]  Utilities: Not At Risk (05/14/2023)   Received from [facility]  Depression (PHQ2-9): Low Risk  (11/18/2022)  Financial Resource Strain: Low Risk  (05/14/2023)   Received from [facility]  Physical Activity: Unknown (05/14/2023)   Received from [facility]  Social Connections: Socially Isolated (05/14/2023)   Received from [facility]  Stress: No Stress Concern Present (05/14/2023)   Received from [facility]  Tobacco Use: Medium Risk (10/18/2023)    Readmission Risk Interventions    03/17/2022   11:21 AM 08/27/2021   12:30 PM  Readmission Risk Prevention Plan  Transportation Screening Complete Complete  PCP or Specialist Appt within 3-5 Days  Complete  HRI or Home Care Consult  Complete  Social Work Consult for Recovery Care Planning/Counseling  Complete  Palliative Care Screening  Not Applicable  Medication Review Oceanographer) Complete Complete  HRI or Home Care  Consult Complete   SW Recovery Care/Counseling Consult Complete   Palliative Care Screening Not Applicable   Skilled Nursing Facility Not Applicable

## 2023-10-19 NOTE — NC FL2 (Signed)
[place] MEDICAID FL2 LEVEL OF CARE FORM     IDENTIFICATION  Patient Name: Robert Osborne Birthdate: 1946-03-30 Sex: male Admission Date (Current Location): 10/18/2023  [facility] and [place] Number:  [employer] and Address:  [facility],  [address] [place] [address]      Provider Number: [id number]  Attending Physician Name and Address:  System, Provider Not In  Relative Name and Phone Number:  DYMIR, NEESON (Spouse)  [phone]    Current Level of Care: Hospital Recommended Level of Care: Skilled Nursing Facility Prior Approval Number:    Date Approved/Denied:   PASRR Number: [id number]  Discharge Plan: SNF    Current Diagnoses: Patient Active Problem List   Diagnosis Date Noted   Coronary artery disease involving native coronary artery of native heart without angina pectoris 08/31/2022   Non-productive cough    Leucocytosis    Shortness of breath    Thrombocytopenia (HCC)    CAP (community acquired pneumonia) 03/17/2022   Elevated troponin 03/17/2022   Sepsis (HCC) 03/16/2022   NSTEMI (non-ST elevated myocardial infarction) (HCC) 09/23/2021   S/P CABG x 4 08/23/2021   Acute anterior wall MI (HCC) 08/16/2021   Chest pain 08/15/2021   Right upper lobe pulmonary nodule 08/15/2021   Hypoalbuminemia due to protein-calorie malnutrition (HCC) 08/15/2021   AKI (acute kidney injury) (HCC) 08/15/2021   Hyperglycemia due to diabetes mellitus (HCC) 08/15/2021   BPH (benign prostatic hyperplasia) 08/15/2021   Hypertension associated with diabetes (HCC) 06/25/2021   Male erectile disorder 03/05/2021   Diabetic polyneuropathy associated with type 2 diabetes mellitus (HCC) 05/07/2020   Frontotemporal dementia with behavioral disturbance (HCC) 01/07/2020   Noncompliance with diabetes treatment 01/07/2020   Generalized weakness    Neurological deficit present    Facial droop 08/17/2019   Hx of bladder cancer 07/29/2019   Abnormal stress test  08/11/2018   PVC's (premature ventricular contractions) 08/11/2018   Hypokalemia 11/27/2017   Hypomagnesemia 11/27/2017   Major frontotemporal neurocognitive disorder, probable, with behavioral disturbance 11/27/2017   Uncontrolled type 2 diabetes mellitus with hyperglycemia (HCC) 11/27/2017   Erectile dysfunction 10/24/2016   Bladder cancer (HCC) 08/18/2013   Diabetes mellitus (HCC)    Vitamin D deficiency 05/17/2013   Hyperlipidemia associated with type 2 diabetes mellitus (HCC) 05/17/2013    Orientation RESPIRATION BLADDER Height & Weight     Self, Time, Situation, Place  Normal Continent Weight: 68 kg Height:  5\' 7"  (170.2 cm)  BEHAVIORAL SYMPTOMS/MOOD NEUROLOGICAL BOWEL NUTRITION STATUS      Continent Diet (See DC summary)  AMBULATORY STATUS COMMUNICATION OF NEEDS Skin   Extensive Assist Verbally Normal                       Personal Care Assistance Level of Assistance  Bathing, Feeding, Dressing Bathing Assistance: Maximum assistance Feeding assistance: Limited assistance Dressing Assistance: Maximum assistance     Functional Limitations Info  Sight, Hearing, Speech Sight Info: Impaired Hearing Info: Adequate Speech Info: Adequate    SPECIAL CARE FACTORS FREQUENCY  PT (By licensed PT)     PT Frequency: 5 times a week              Contractures Contractures Info: Not present    Additional Factors Info  Code Status, Allergies Code Status Info: Full Allergies Info: Enalapril           Current Medications (10/19/2023):  This is the current hospital active medication list Current Facility-Administered Medications  Medication  Dose Route Frequency Provider Last Rate Last Admin   aspirin EC tablet 81 mg  81 mg Oral Daily Evlyn Kanner T, PA-C   81 mg at 10/18/23 1340   clopidogrel (PLAVIX) tablet 75 mg  75 mg Oral Daily Netta Corrigan, PA-C   75 mg at 10/18/23 1338   dapagliflozin propanediol (FARXIGA) tablet 10 mg  10 mg Oral Daily Schuman, James  T, PA-C       ezetimibe (ZETIA) tablet 10 mg  10 mg Oral Daily Evlyn Kanner T, PA-C   10 mg at 10/18/23 1340   glimepiride (AMARYL) tablet 2 mg  2 mg Oral Daily Schuman, James T, PA-C       insulin glargine-yfgn [facility]) injection 5 Units  5 Units Subcutaneous Daily Foye Deer, RPH   5 Units at 10/18/23 1710   isosorbide mononitrate (IMDUR) 24 hr tablet 60 mg  60 mg Oral Daily Evlyn Kanner T, PA-C   60 mg at 10/18/23 1341   metoprolol tartrate (LOPRESSOR) tablet 50 mg  50 mg Oral BID Evlyn Kanner T, PA-C   50 mg at 10/18/23 2226   multivitamin with minerals tablet 1 tablet  1 tablet Oral Daily Netta Corrigan, PA-C   1 tablet at 10/18/23 1337   nitroGLYCERIN (NITROSTAT) SL tablet 0.4 mg  0.4 mg Sublingual Q5 min PRN Evlyn Kanner T, PA-C       OLANZapine (ZYPREXA) tablet 5 mg  5 mg Oral QHS Schuman, James T, PA-C   5 mg at 10/18/23 2226   rosuvastatin (CRESTOR) tablet 40 mg  40 mg Oral Daily Evlyn Kanner T, PA-C   40 mg at 10/18/23 1341   traMADol (ULTRAM) tablet 50 mg  50 mg Oral Q6H PRN Evlyn Kanner T, PA-C   50 mg at 10/19/23 6606   Current Outpatient Medications  Medication Sig Dispense Refill   acetaminophen (TYLENOL) 500 MG tablet Take 500 mg by mouth every 6 (six) hours as needed for mild pain (pain score 1-3).     aspirin EC 81 MG EC tablet Take 1 tablet (81 mg total) by mouth daily. Swallow whole. 30 tablet 11   clopidogrel (PLAVIX) 75 MG tablet Take 1 tablet (75 mg total) by mouth daily. 30 tablet 3   dapagliflozin propanediol (FARXIGA) 10 MG TABS tablet Take 1 tablet (10 mg total) by mouth daily. 30 tablet 3   gabapentin (NEURONTIN) 100 MG capsule Take 100 mg by mouth 2 (two) times daily.     glimepiride (AMARYL) 2 MG tablet Take 2 mg by mouth daily.     HYDROcodone-acetaminophen (NORCO/VICODIN) 5-325 MG tablet One tablet every six hours for pain.  Limit 7 days. 28 tablet 0   ibuprofen (ADVIL) 200 MG tablet Take 200 mg by mouth every 6 (six) hours as needed for mild  pain (pain score 1-3).     insulin degludec (TRESIBA FLEXTOUCH) 100 UNIT/ML FlexTouch Pen Inject 5 Units into the skin daily. 3 mL 0   metoprolol tartrate (LOPRESSOR) 50 MG tablet TAKE 1 TABLET BY MOUTH TWICE A DAY 30 tablet 0   Multiple Vitamin (MULTIVITAMIN WITH MINERALS) TABS tablet Take 1 tablet by mouth daily.     naloxone (NARCAN) nasal spray 4 mg/0.1 mL Place 1 spray into the nose once.     nitroGLYCERIN (NITROSTAT) 0.4 MG SL tablet Place 0.4 mg under the tongue every 5 (five) minutes as needed for chest pain.     Nutritional Supplements (,FEEDING SUPPLEMENT, PROSOURCE PLUS) liquid Take 30 mLs  by mouth 2 (two) times daily between meals. 887 mL 1   OLANZapine (ZYPREXA) 5 MG tablet Take 5 mg by mouth at bedtime.     ondansetron (ZOFRAN) 4 MG tablet Take 4 mg by mouth every 8 (eight) hours as needed for nausea or vomiting.     rosuvastatin (CRESTOR) 40 MG tablet Take 1 tablet (40 mg total) by mouth daily. 30 tablet 3   tamsulosin (FLOMAX) 0.4 MG CAPS capsule Take 0.4 mg by mouth daily.     blood glucose meter kit and supplies KIT Dispense based on patient and insurance preference. Use up to twice a daily as directed. (FOR ICD-10 - type 2 DM uncontrolled E11.9) 1 each 0   blood glucose meter kit and supplies Dispense based on patient and insurance preference. Use up to four times daily as directed. (FOR ICD-10 E10.9, E11.9). 1 each 0   blood glucose meter kit and supplies Use up to four times daily as directed. dx E11.9 1 each 1   Blood Glucose Monitoring Suppl (ONE TOUCH ULTRA 2) w/Device KIT USE TO CHECK BLOOD SUGAR UP TO 4 TIMES DAILY AS DIRECTED 1 kit 0   ezetimibe (ZETIA) 10 MG tablet Take 1 tablet (10 mg total) by mouth daily. (Patient not taking: Reported on 10/18/2023) 30 tablet 0   glucose blood test strip Use as instructed 100 each 12   Insulin Pen Needle (PEN NEEDLES) 32G X 4 MM MISC 100 each by Does not apply route 4 (four) times daily. E10.9 100 each 5   Lancets (ONETOUCH DELICA PLUS  LANCET33G) MISC USE TO CHECK BLOOD SUGAR UP TO 4 TIMES A DAY AS DIRECTED 100 each 0     Discharge Medications: Please see discharge summary for a list of discharge medications.  Relevant Imaging Results:  Relevant Lab Results:   Additional Information SS# [id number]  Leitha Bleak, RN

## 2023-10-19 NOTE — TOC Initial Note (Signed)
Transition of Care [facility]) - Initial/Assessment Note    Patient Details  Name: Robert Osborne MRN: [id number] Date of Birth: Oct 26, 1946  Transition of Care [facility]) CM/SW Contact:    Leitha Bleak, RN Phone Number: 10/19/2023, 9:13 AM  Clinical Narrative:  Patient is ED, TOC consulted to for SNF placement. CM spoke with his wife. She states he has had multiple falls. He does not drive, and uses a cane. He completed HHPT with [facility] 3 weeks ago. He was at [facility] for rehab for 2 weeks in 2023.  PT eval is pending. She is interested if recommended for him to go back to [facility]. TOC following.                 Expected Discharge Plan: Skilled Nursing Facility Barriers to Discharge: Continued Medical Work up   Patient Goals and CMS Choice Patient states their goals for this hospitalization and ongoing recovery are:: agreeable to SNF if recommended CMS Medicare.gov Compare Post Acute Care list provided to:: Patient Represenative (must comment) Choice offered to / list presented to : Spouse [place] ownership interest in [facility].provided to:: Spouse    Expected Discharge Plan and Services     Post Acute Care Choice: Skilled Nursing Facility Living arrangements for the past 2 months: Single Family Home           Prior Living Arrangements/Services Living arrangements for the past 2 months: Single Family Home Lives with:: Spouse Patient language and need for interpreter reviewed:: Yes        Need for Family Participation in Patient Care: Yes (Comment) Care giver support system in place?: Yes (comment) Current home services: DME Criminal Activity/Legal Involvement Pertinent to Current Situation/Hospitalization: No - Comment as needed  Activities of Daily Living      Permission Sought/Granted            Permission granted to share info w Relationship: wife  Permission granted to share info w Contact Information: SNf's  Emotional Assessment      Affect (typically observed): Accepting Orientation: : Oriented to Self, Oriented to Situation, Oriented to Place Alcohol / Substance Use: Not Applicable Psych Involvement: No (comment)  Admission diagnosis:  back pain, uti Patient Active Problem List   Diagnosis Date Noted   Coronary artery disease involving native coronary artery of native heart without angina pectoris 08/31/2022   Non-productive cough    Leucocytosis    Shortness of breath    Thrombocytopenia (HCC)    CAP (community acquired pneumonia) 03/17/2022   Elevated troponin 03/17/2022   Sepsis (HCC) 03/16/2022   NSTEMI (non-ST elevated myocardial infarction) (HCC) 09/23/2021   S/P CABG x 4 08/23/2021   Acute anterior wall MI (HCC) 08/16/2021   Chest pain 08/15/2021   Right upper lobe pulmonary nodule 08/15/2021   Hypoalbuminemia due to protein-calorie malnutrition (HCC) 08/15/2021   AKI (acute kidney injury) (HCC) 08/15/2021   Hyperglycemia due to diabetes mellitus (HCC) 08/15/2021   BPH (benign prostatic hyperplasia) 08/15/2021   Hypertension associated with diabetes (HCC) 06/25/2021   Male erectile disorder 03/05/2021   Diabetic polyneuropathy associated with type 2 diabetes mellitus (HCC) 05/07/2020   Frontotemporal dementia with behavioral disturbance (HCC) 01/07/2020   Noncompliance with diabetes treatment 01/07/2020   Generalized weakness    Neurological deficit present    Facial droop 08/17/2019   Hx of bladder cancer 07/29/2019   Abnormal stress test 08/11/2018   PVC's (premature ventricular contractions) 08/11/2018   Hypokalemia 11/27/2017   Hypomagnesemia 11/27/2017  Major frontotemporal neurocognitive disorder, probable, with behavioral disturbance 11/27/2017   Uncontrolled type 2 diabetes mellitus with hyperglycemia (HCC) 11/27/2017   Erectile dysfunction 10/24/2016   Bladder cancer (HCC) 08/18/2013   Diabetes mellitus (HCC)    Vitamin D deficiency 05/17/2013   Hyperlipidemia associated with type  2 diabetes mellitus (HCC) 05/17/2013   PCP:  [name], PA-C Pharmacy:   [facility] [phone] [place], [place] - [address] [place] [address] Phone: [phone] Fax: [phone]    Social Determinants of Health (SDOH) Social History: SDOH Screenings   Food Insecurity: No Food Insecurity (05/14/2023)   Received from [facility]  Housing: Low Risk  (11/18/2022)  Transportation Needs: No Transportation Needs (05/14/2023)   Received from [facility]  Utilities: Not At Risk (05/14/2023)   Received from [facility]  Depression (PHQ2-9): Low Risk  (11/18/2022)  Financial Resource Strain: Low Risk  (05/14/2023)   Received from [facility]  Physical Activity: Unknown (05/14/2023)   Received from [facility]  Social Connections: Socially Isolated (05/14/2023)   Received from [facility]  Stress: No Stress Concern Present (05/14/2023)   Received from [facility]  Tobacco Use: Medium Risk (10/18/2023)   SDOH Interventions:    Readmission Risk Interventions    03/17/2022   11:21 AM 08/27/2021   12:30 PM  Readmission Risk Prevention Plan  Transportation Screening Complete Complete  PCP or Specialist Appt within 3-5 Days  Complete  HRI or Home Care Consult  Complete  Social Work Consult for Recovery Care Planning/Counseling  Complete  Palliative Care Screening  Not Applicable  Medication Review Oceanographer) Complete Complete  HRI or Home Care Consult Complete   SW Recovery Care/Counseling Consult Complete   Palliative Care Screening Not Applicable   Skilled Nursing Facility Not Applicable

## 2023-10-19 NOTE — TOC Progression Note (Addendum)
Transition of Care [facility]) - Progression Note    Patient Details  Name: Robert Osborne MRN: [id number] Date of Birth: 06-Mar-1946  Transition of Care [facility]) CM/SW Contact  Leitha Bleak, RN Phone Number: 10/19/2023, 1:49 PM  Clinical Narrative:   Clovis Cao signed. Faxed and Emailed to [place] at [facility]. CM following for a bed offer and to start INS AUTH.     Addendum:   [facility] does not have any beds, CM called his wife and expanded the search.    Expected Discharge Plan: Skilled Nursing Facility Barriers to Discharge: Continued Medical Work up  Expected Discharge Plan and Services     Post Acute Care Choice: Skilled Nursing Facility Living arrangements for the past 2 months: Single Family Home                                       Social Determinants of Health (SDOH) Interventions SDOH Screenings   Food Insecurity: No Food Insecurity (05/14/2023)   Received from [facility]  Housing: Low Risk  (11/18/2022)  Transportation Needs: No Transportation Needs (05/14/2023)   Received from [facility]  Utilities: Not At Risk (05/14/2023)   Received from [facility]  Depression (PHQ2-9): Low Risk  (11/18/2022)  Financial Resource Strain: Low Risk  (05/14/2023)   Received from [facility]  Physical Activity: Unknown (05/14/2023)   Received from [facility]  Social Connections: Socially Isolated (05/14/2023)   Received from [facility]  Stress: No Stress Concern Present (05/14/2023)   Received from [facility]  Tobacco Use: Medium Risk (10/18/2023)    Readmission Risk Interventions    03/17/2022   11:21 AM 08/27/2021   12:30 PM  Readmission Risk Prevention Plan  Transportation Screening Complete Complete  PCP or Specialist Appt within 3-5 Days  Complete  HRI or Home Care Consult  Complete  Social Work Consult for Recovery Care Planning/Counseling  Complete  Palliative Care Screening  Not Applicable  Medication Review Oceanographer) Complete  Complete  HRI or Home Care Consult Complete   SW Recovery Care/Counseling Consult Complete   Palliative Care Screening Not Applicable   Skilled Nursing Facility Not Applicable    1300

## 2023-10-19 NOTE — Plan of Care (Signed)
  Problem: Acute Rehab PT Goals(only PT should resolve) Goal: Pt Will Go Supine/Side To Sit Outcome: Progressing Flowsheets (Taken 10/19/2023 1227) Pt will go Supine/Side to Sit:  with contact guard assist  with minimal assist Goal: Patient Will Transfer Sit To/From Stand Outcome: Progressing Flowsheets (Taken 10/19/2023 1227) Patient will transfer sit to/from stand:  with minimal assist  with contact guard assist Goal: Pt Will Transfer Bed To Chair/Chair To Bed Outcome: Progressing Flowsheets (Taken 10/19/2023 1227) Pt will Transfer Bed to Chair/Chair to Bed: with min assist Goal: Pt Will Ambulate Outcome: Progressing Flowsheets (Taken 10/19/2023 1227) Pt will Ambulate:  25 feet  with minimal assist  with rolling walker   12:27 PM, 10/19/23 Ocie Bob, MPT Physical Therapist with [facility] [phone] office [phone] mobile phone

## 2023-10-19 NOTE — ED Notes (Signed)
In to check on pt and found him lying in bed with linen wet from urine. Pt had taken off brief and had discarded it onto floor. Pt cleaned and linen changed. Fall Risk bracelet and socks applied per hospital policy. Pt given call bell and instructed to call for assistance as needed.

## 2023-10-20 ENCOUNTER — Emergency Department (HOSPITAL_COMMUNITY): Payer: Medicare HMO

## 2023-10-20 LAB — CBC WITH DIFFERENTIAL/PLATELET
Abs Immature Granulocytes: 0.03 10*3/uL (ref 0.00–0.07)
Basophils Absolute: 0 10*3/uL (ref 0.0–0.1)
Basophils Relative: 0 %
Eosinophils Absolute: 0 10*3/uL (ref 0.0–0.5)
Eosinophils Relative: 0 %
HCT: 42.7 % (ref 39.0–52.0)
Hemoglobin: 13.3 g/dL (ref 13.0–17.0)
Immature Granulocytes: 1 %
Lymphocytes Relative: 23 %
Lymphs Abs: 1.3 10*3/uL (ref 0.7–4.0)
MCH: 27.5 pg (ref 26.0–34.0)
MCHC: 31.1 g/dL (ref 30.0–36.0)
MCV: 88.4 fL (ref 80.0–100.0)
Monocytes Absolute: 0.9 10*3/uL (ref 0.1–1.0)
Monocytes Relative: 16 %
Neutro Abs: 3.2 10*3/uL (ref 1.7–7.7)
Neutrophils Relative %: 60 %
Platelets: 144 10*3/uL — ABNORMAL LOW (ref 150–400)
RBC: 4.83 MIL/uL (ref 4.22–5.81)
RDW: 14.4 % (ref 11.5–15.5)
WBC: 5.4 10*3/uL (ref 4.0–10.5)
nRBC: 0 % (ref 0.0–0.2)

## 2023-10-20 LAB — HEPATIC FUNCTION PANEL
ALT: 21 U/L (ref 0–44)
AST: 29 U/L (ref 15–41)
Albumin: 3.3 g/dL — ABNORMAL LOW (ref 3.5–5.0)
Alkaline Phosphatase: 63 U/L (ref 38–126)
Bilirubin, Direct: 0.1 mg/dL (ref 0.0–0.2)
Indirect Bilirubin: 0.6 mg/dL (ref 0.3–0.9)
Total Bilirubin: 0.7 mg/dL (ref <1.2)
Total Protein: 6.6 g/dL (ref 6.5–8.1)

## 2023-10-20 LAB — CBG MONITORING, ED
Glucose-Capillary: 98 mg/dL (ref 70–99)
Glucose-Capillary: 87 mg/dL (ref 70–99)

## 2023-10-20 MED ORDER — IOHEXOL 350 MG/ML SOLN
100.0000 mL | Freq: Once | INTRAVENOUS | Status: AC | PRN
  Administered 2023-10-20: 100 mL via INTRAVENOUS

## 2023-10-20 NOTE — ED Notes (Signed)
Pt in bed, pt states that his pain is slightly decreased, requests pain med when available.

## 2023-10-20 NOTE — ED Notes (Signed)
Pt in bed, iv placement unsuccessful.  IV placed using ultrasound in R upper arm, placed on first attempt, flush without pain or swelling, second RN at bedside to assess iv.  Pt awaits CT.

## 2023-10-20 NOTE — ED Notes (Signed)
Patient given bed bath x2 assist; face washed, linen changed. Patient provided fresh gown and socks. Mepilex placed to patient's sacrum for precautions, no wound or redness noted. Oral care provided and mouth moisturizer applied to mouth and lips

## 2023-10-20 NOTE — ED Notes (Signed)
Changed bedding and depends, pt had voided in depends, cleaned pt and replaced bedding, cut up pts meal tray and positioned for pt to feed self, pt has no other requests at this time.

## 2023-10-20 NOTE — ED Notes (Signed)
This RN and tech went to update pts vitals, upon entering room pt was found to be wet with urine and vomit was on floor and in emesis basin. Emesis appeared to be very dark in color. Pt stated his stomach started hurting earlier in the night and he had two episodes of emesis. Pt is on blood thinners. EDP made aware.  Orders received

## 2023-10-20 NOTE — ED Notes (Signed)
Holding medications at this time, pending CT results.

## 2023-10-20 NOTE — TOC Progression Note (Signed)
Transition of Care [facility]) - Progression Note    Patient Details  Name: Robert Osborne MRN: [id number] Date of Birth: 1946-04-08  [facility] of Care [facility]) CM/SW Contact  Leitha Bleak, RN Phone Number: 10/20/2023, 9:51 AM  Clinical Narrative:   [facility] visited and is accepting the patient and has a bed. Patient was there last year, per wife their first choice close to home. TOC started INS AUTH, Team updated and CM called Wife.    Expected Discharge Plan: Skilled Nursing Facility Barriers to Discharge: Insurance Authorization  Expected Discharge Plan and Services     Post Acute Care Choice: Skilled Nursing Facility Living arrangements for the past 2 months: Single Family Home                    Social Determinants of Health (SDOH) Interventions SDOH Screenings   Food Insecurity: No Food Insecurity (05/14/2023)   Received from [facility]  Housing: Low Risk  (11/18/2022)  Transportation Needs: No Transportation Needs (05/14/2023)   Received from [facility]  Utilities: Not At Risk (05/14/2023)   Received from [facility]  Depression (PHQ2-9): Low Risk  (11/18/2022)  Financial Resource Strain: Low Risk  (05/14/2023)   Received from [facility]  Physical Activity: Unknown (05/14/2023)   Received from [facility]  Social Connections: Socially Isolated (05/14/2023)   Received from [facility]  Stress: No Stress Concern Present (05/14/2023)   Received from [facility]  Tobacco Use: Medium Risk (10/18/2023)    Readmission Risk Interventions    03/17/2022   11:21 AM 08/27/2021   12:30 PM  Readmission Risk Prevention Plan  Transportation Screening Complete Complete  PCP or Specialist Appt within 3-5 Days  Complete  HRI or Home Care Consult  Complete  Social Work Consult for Recovery Care Planning/Counseling  Complete  Palliative Care Screening  Not Applicable  Medication Review Oceanographer) Complete Complete  HRI or Home Care Consult Complete   SW  Recovery Care/Counseling Consult Complete   Palliative Care Screening Not Applicable   Skilled Nursing Facility Not Applicable

## 2023-10-20 NOTE — ED Notes (Signed)
Iv placed by second RN, pt to ct for scan

## 2023-10-20 NOTE — ED Provider Notes (Signed)
Emergency Medicine Observation Re-evaluation Note  Robert Osborne is a 77 y.o. male, seen on rounds today.  Pt initially presented to the ED for complaints of Back Pain Currently, the patient is resting comfortably.  Physical Exam  BP 133/74   Pulse 64   Temp 97.9 F (36.6 C) (Oral)   Resp 16   Ht 5\' 7"  (1.702 m)   Wt 68 kg   SpO2 100%   BMI 23.49 kg/m  Physical Exam Vitals and nursing note reviewed.  Constitutional:      General: He is not in acute distress.    Appearance: He is well-developed.  HENT:     Head: Normocephalic and atraumatic.  Eyes:     Conjunctiva/sclera: Conjunctivae normal.  Cardiovascular:     Rate and Rhythm: Normal rate and regular rhythm.     Heart sounds: No murmur heard. Pulmonary:     Effort: Pulmonary effort is normal. No respiratory distress.  Musculoskeletal:        General: No swelling.     Cervical back: Neck supple.  Skin:    General: Skin is warm and dry.  Neurological:     Mental Status: He is alert.  Psychiatric:        Mood and Affect: Mood normal.      ED Course / MDM  EKG:   I have reviewed the labs performed to date as well as medications administered while in observation.  Recent changes in the last 24 hours include TOC work and plan for SNF placement, possible episode of hematemesis overnight but repeat CBC unremarkable.  No additional episodes of hematemesis..  Plan  Current plan is for social work placement.    Glendora Score, MD 10/20/23 [phone]

## 2023-10-20 NOTE — ED Notes (Signed)
Pt states that he needs to be changed, checked pt, pt has not had a bm, depends is dry, repositioned pt,  offered to pull pt up in bed, pt states that he is comfortable.  Pt has no other requests at this time.

## 2023-10-20 NOTE — ED Notes (Addendum)
Pt has swelling around iv site, d/c iv in R upper arm.  Attempted iv in L arm, iv will not thread, second RN at bedside for iv placement.

## 2023-10-20 NOTE — ED Provider Notes (Signed)
Per RN, patient had vomited on the floor at some point this evening with possible blood. Will check CBC and monitor for any further vomiting.    Pollyann Savoy, MD 10/20/23 [phone]

## 2023-10-21 LAB — BASIC METABOLIC PANEL
Anion gap: 9 (ref 5–15)
BUN: 30 mg/dL — ABNORMAL HIGH (ref 8–23)
CO2: 26 mmol/L (ref 22–32)
Calcium: 8.3 mg/dL — ABNORMAL LOW (ref 8.9–10.3)
Chloride: 106 mmol/L (ref 98–111)
Creatinine, Ser: 1.26 mg/dL — ABNORMAL HIGH (ref 0.61–1.24)
GFR, Estimated: 58 mL/min — ABNORMAL LOW (ref >60)
Glucose, Bld: 79 mg/dL (ref 70–99)
Potassium: 3.7 mmol/L (ref 3.5–5.1)
Sodium: 141 mmol/L (ref 135–145)

## 2023-10-21 LAB — URINALYSIS, ROUTINE W REFLEX MICROSCOPIC
Bilirubin Urine: NEGATIVE
Glucose, UA: >=500 mg/dL — AB
Hgb urine dipstick: NEGATIVE
Ketones, ur: NEGATIVE mg/dL
Leukocytes,Ua: NEGATIVE
Nitrite: NEGATIVE
Protein, ur: 100 mg/dL — AB
Specific Gravity, Urine: 1.037 — ABNORMAL HIGH (ref 1.005–1.030)
pH: 6 (ref 5.0–8.0)

## 2023-10-21 LAB — CBG MONITORING, ED
Glucose-Capillary: 70 mg/dL (ref 70–99)
Glucose-Capillary: 55 mg/dL — ABNORMAL LOW (ref 70–99)
Glucose-Capillary: 101 mg/dL — ABNORMAL HIGH (ref 70–99)
Glucose-Capillary: 192 mg/dL — ABNORMAL HIGH (ref 70–99)

## 2023-10-21 MED ORDER — INSULIN ASPART 100 UNIT/ML IJ SOLN
0.0000 [IU] | Freq: Three times a day (TID) | INTRAMUSCULAR | Status: DC
  Administered 2023-10-21: 1 [IU] via SUBCUTANEOUS
  Filled 2023-10-21: qty 1

## 2023-10-21 MED ORDER — DEXTROSE 50 % IV SOLN
25.0000 mL | Freq: Once | INTRAVENOUS | Status: AC
  Administered 2023-10-21: 25 mL via INTRAVENOUS
  Filled 2023-10-21: qty 50

## 2023-10-21 MED ORDER — ONDANSETRON HCL 4 MG PO TABS: 4.0000 mg | ORAL_TABLET | Freq: Three times a day (TID) | ORAL | Status: DC | PRN

## 2023-10-21 MED ORDER — GABAPENTIN 100 MG PO CAPS: 100.0000 mg | ORAL_CAPSULE | Freq: Two times a day (BID) | ORAL | Status: DC

## 2023-10-21 MED ORDER — PEN NEEDLES 32G X 4 MM MISC: 100.0000 | Freq: Four times a day (QID) | Status: DC

## 2023-10-21 MED ORDER — ACETAMINOPHEN 500 MG PO TABS: 500.0000 mg | ORAL_TABLET | Freq: Four times a day (QID) | ORAL | Status: DC | PRN

## 2023-10-21 NOTE — TOC Transition Note (Addendum)
Transition of Care [facility]) - CM/SW Discharge Note   Patient Details  Name: Robert Osborne MRN: [id number] Date of Birth: [birth year]  Transition of Care [facility]) CM/SW Contact:  Leitha Bleak, RN Phone Number: 10/21/2023, 2:56 PM   Clinical Narrative: INS AUTH received  and approved, next review date is 11/12, certification # is [id number], Robby at [facility] provided room number, [facility] will email AVS once completed. RN has the number to call report and will have secretary set up transport. CM called to update his wife with DC plan.    Addendum:  RN called report, TOC emailed AVS to [employer], Secretary call EMS.    Final next level of care: Skilled Nursing Facility Barriers to Discharge: Barriers Resolved   Patient Goals and CMS Choice CMS Medicare.gov Compare Post Acute Care list provided to:: Patient Represenative (must comment) Choice offered to / list presented to : Spouse  Discharge Placement                Patient to be transferred to facility by: [facility] Name of family member notified: [facility] Additional resources added to the After Visit Summary for      Post Acute Care Choice: Skilled Nursing Facility               Social Determinants of Health (SDOH) Interventions SDOH Screenings   Food Insecurity: No Food Insecurity (05/14/2023)   Received from [facility]  Housing: Low Risk  (11/18/2022)  Transportation Needs: No Transportation Needs (05/14/2023)   Received from [facility]  Utilities: Not At Risk (05/14/2023)   Received from [facility]  Depression (PHQ2-9): Low Risk  (11/18/2022)  Financial Resource Strain: Low Risk  (05/14/2023)   Received from [facility]  Physical Activity: Unknown (05/14/2023)   Received from [facility]  Social Connections: Socially Isolated (05/14/2023)   Received from [facility]  Stress: No Stress Concern Present (05/14/2023)   Received from [facility]  Tobacco Use: Medium Risk (10/18/2023)     Readmission Risk Interventions    03/17/2022   11:21 AM 08/27/2021   12:30 PM  Readmission Risk Prevention Plan  Transportation Screening Complete Complete  PCP or Specialist Appt within 3-5 Days  Complete  HRI or Home Care Consult  Complete  Social Work Consult for Recovery Care Planning/Counseling  Complete  Palliative Care Screening  Not Applicable  Medication Review Oceanographer) Complete Complete  HRI or Home Care Consult Complete   SW Recovery Care/Counseling Consult Complete   Palliative Care Screening Not Applicable   Skilled Nursing Facility Not Applicable

## 2023-10-21 NOTE — ED Provider Notes (Signed)
Emergency Medicine Observation Re-evaluation Note  Robert Osborne is a 77 y.o. male, seen on rounds today.  Pt initially presented to the ED for complaints of Back Pain Currently, the patient is resting comfortably,  The patient has been evaluated for some degree of chronic pain and significant deconditioning, spouse unable to care for him, transition of care team has been consulted for placement, the patient had a negative workup including labs and the CT scan, urinalysis will be added, he did have some transient hypoglycemia and has not been eating well.  He was given some dextrose, will check full set of vital signs and urinalysis this morning.  He is in no distress and has no complaints other than some mild back pain.  Workup reviewed and unremarkable.  Physical Exam  BP (!) 156/89 (BP Location: Right Arm)   Pulse (!) 55   Temp (!) 97.3 F (36.3 C) (Oral)   Resp 15   Ht 1.702 m (5\' 7" )   Wt 68 kg   SpO2 95%   BMI 23.49 kg/m  Physical Exam General: Sleeping comfortably Cardiac: Mild sinus bradycardia Lungs: No distress, no tachypnea Psych: Affect unremarkable  ED Course / MDM  EKG:   I have reviewed the labs performed to date as well as medications administered while in observation.  Recent changes in the last 24 hours include still awaiting placement, transient hypoglycemia.  Plan  Current plan is for checking urinalysis and repeat blood pressure, anticipate placement unless significant abnormalities found.    Eber Hong, MD 10/21/23 [phone]

## 2023-10-21 NOTE — ED Notes (Signed)
Pt in recliner, lunch tray set up.

## 2023-10-21 NOTE — ED Notes (Signed)
Wife updated as to patient's status. 

## 2023-10-21 NOTE — Progress Notes (Signed)
Physical Therapy Treatment Patient Details Name: Robert Osborne MRN: [id number] DOB: [birth year] Today's Date: 10/21/2023   History of Present Illness Robert Osborne is a 77 y.o. male history of diabetes, bladder cancer, frontotemporal neurocognitive disorder, NSTEMI, CABG x 4, diabetic polyneuropathy, chronic pain presented for low back pain that has been present since a mechanical fall 3 days ago.  Patient came in and was evaluated in the ER and had reassuring workup however since then has not been walking around due to pain in his low back.  Patient's been using ibuprofen along with lidocaine patches to no relief.  Patient denies any saddle anesthesia, bowel/bladder incontinence, paresthesias that are new.  Patient states that he thinks he has a UTI based on his urine sample from previous ER visit but denies any dysuria, hematuria or urinary symptoms, flank pain, fevers, abdominal pain.  Wife also wants help with placement as she states she is unable to care for him at home given his medical needs.    PT Comments  Patient demonstrates slow labored movement for sitting up at bedside, very unsteady on feet and limited to a few steps at bedside before having to sit due to c/o fatigue, weakness.  Patient tolerated sitting up in chair after therapy - nursing staff notified.  Patient will benefit from continued skilled physical therapy in hospital and recommended venue below to increase strength, balance, endurance for safe ADLs and gait.     If plan is discharge home, recommend the following: A lot of help with walking and/or transfers;A lot of help with bathing/dressing/bathroom;Assistance with cooking/housework;Help with stairs or ramp for entrance   Can travel by private vehicle     Yes  Equipment Recommendations  None recommended by PT    Recommendations for Other Services       Precautions / Restrictions Precautions Precautions: Fall Restrictions Weight Bearing Restrictions: No      Mobility  Bed Mobility Overal bed mobility: Needs Assistance Bed Mobility: Supine to Sit     Supine to sit: Mod assist     General bed mobility comments: slow labored movement    Transfers Overall transfer level: Needs assistance Equipment used: Rolling walker (2 wheels) Transfers: Sit to/from Stand, Bed to chair/wheelchair/BSC Sit to Stand: Mod assist   Step pivot transfers: Mod assist            Ambulation/Gait Ambulation/Gait assistance: Mod assist, Max assist Gait Distance (Feet): 4 Feet Assistive device: Rolling walker (2 wheels), Crutches Gait Pattern/deviations: Decreased step length - right, Decreased step length - left, Decreased stride length Gait velocity: slow     General Gait Details: limited to a few slow labored steps at bedside   Stairs             Wheelchair Mobility     Tilt Bed    Modified Rankin (Stroke Patients Only)       Balance Overall balance assessment: Needs assistance Sitting-balance support: Feet supported, No upper extremity supported Sitting balance-Leahy Scale: Fair Sitting balance - Comments: seated at EOB   Standing balance support: Reliant on assistive device for balance, During functional activity, Bilateral upper extremity supported Standing balance-Leahy Scale: Poor Standing balance comment: using RW                            Cognition Arousal: Alert Behavior During Therapy: WFL for tasks assessed/performed Overall Cognitive Status: Within Functional Limits for tasks assessed  Exercises      General Comments        Pertinent Vitals/Pain Pain Assessment Pain Assessment: No/denies pain    Home Living                          Prior Function            PT Goals (current goals can now be found in the care plan section) Acute Rehab PT Goals Patient Stated Goal: Return home after rehab PT Goal Formulation: With  patient Time For Goal Achievement: 11/02/23 Potential to Achieve Goals: Good    Frequency    Min 2X/week      PT Plan      Co-evaluation              AM-PAC PT "6 Clicks" Mobility   Outcome Measure  Help needed turning from your back to your side while in a flat bed without using bedrails?: A Little Help needed moving from lying on your back to sitting on the side of a flat bed without using bedrails?: A Lot Help needed moving to and from a bed to a chair (including a wheelchair)?: A Lot Help needed standing up from a chair using your arms (e.g., wheelchair or bedside chair)?: A Lot Help needed to walk in hospital room?: A Lot Help needed climbing 3-5 steps with a railing? : A Lot 6 Click Score: 13    End of Session   Activity Tolerance: Patient tolerated treatment well;Patient limited by fatigue Patient left: in chair Nurse Communication: Mobility status PT Visit Diagnosis: Unsteadiness on feet (R26.81);Other abnormalities of gait and mobility (R26.89);Muscle weakness (generalized) (M62.81)     Time: 1020-1040 PT Time Calculation (min) (ACUTE ONLY): 20 min  Charges:    $Therapeutic Activity: 23-37 mins PT General Charges $$ ACUTE PT VISIT: 1 Visit                     1:48 PM, 10/21/23 Ocie Bob, MPT Physical Therapist with [facility] 336 [phone] office [phone] mobile phone

## 2023-10-21 NOTE — ED Provider Notes (Signed)
Insurance has authroized for [employer], [place]. Patient ready for discharge.    Coral Spikes, DO 10/21/23 1604

## 2023-10-21 NOTE — Inpatient Diabetes Management (Addendum)
Inpatient Diabetes Program Recommendations  AACE/ADA: New Consensus Statement on Inpatient Glycemic Control (2015)  Target Ranges:  Prepandial:   less than 140 mg/dL      Peak postprandial:   less than 180 mg/dL (1-2 hours)      Critically ill patients:  140 - 180 mg/dL    Latest Reference Range & Units 10/20/23 04:43 10/20/23 22:01 10/21/23 03:50 10/21/23 06:06  Glucose-Capillary 70 - 99 mg/dL 98  5 units Semglee @1214  87 70 55 (L)  (L): Data is abnormally low    To ED with: Lower Back Pain following Fall at Home  History: DM, CKD, Dementia  Home DM Meds: Farxiga 10 mg daily        Amaryl 2 mg daily        Tresiba 5 units daily  Current Orders: Semglee 5 units daily         Farxiga 10 mg daily      Amaryl 2 mg daily    Awaiting SNF placement in the ED   MD- Note Hypoglycemia this AM (CBG 55 at 6am)  Please consider:  1. Stop Semglee for now  2. Start Novolog SSI coverage at the Very Sensitive scale 0-6 units TID (Use Glycemic Control Order set)      --Will follow patient during hospitalization--  Ambrose Finland RN, MSN, CDCES Diabetes Coordinator Inpatient Glycemic Control Team Team Pager: [phone] (8a-5p)

## 2023-10-21 NOTE — ED Notes (Signed)
Pt ate very little of his breakfast, 2 bites of his egg biscuit along with container of pineapple. Pt encouraged to eat several times, but pt continued to say, "It's just not good". Pt requested peanut butter and graham crackers which were given to him.

## 2023-10-21 NOTE — Discharge Instructions (Signed)
Return if any fevers, chills, chest pain, shortness of breath or any new or worsening pain.

## 2023-10-21 NOTE — TOC Progression Note (Signed)
Transition of Care [facility]) - Progression Note    Patient Details  Name: Robert Osborne MRN: [id number] Date of Birth: 03/24/1946  Transition of Care [facility]) CM/SW Contact  Leitha Bleak, RN Phone Number: 10/21/2023, 2:03 PM  Clinical Narrative:   TOC continues to check on INS AUTH, still Pending.  [name] from [facility] updated. He can be reached at [phone].  Fax [phone].     Expected Discharge Plan: Skilled Nursing Facility Barriers to Discharge: Insurance Authorization  Expected Discharge Plan and Services     Post Acute Care Choice: Skilled Nursing Facility Living arrangements for the past 2 months: Single Family Home                   Social Determinants of Health (SDOH) Interventions SDOH Screenings   Food Insecurity: No Food Insecurity (05/14/2023)   Received from [facility]  Housing: Low Risk  (11/18/2022)  Transportation Needs: No Transportation Needs (05/14/2023)   Received from [facility]  Utilities: Not At Risk (05/14/2023)   Received from [facility]  Depression (PHQ2-9): Low Risk  (11/18/2022)  Financial Resource Strain: Low Risk  (05/14/2023)   Received from [facility]  Physical Activity: Unknown (05/14/2023)   Received from [facility]  Social Connections: Socially Isolated (05/14/2023)   Received from [facility]  Stress: No Stress Concern Present (05/14/2023)   Received from [facility]  Tobacco Use: Medium Risk (10/18/2023)    Readmission Risk Interventions    03/17/2022   11:21 AM 08/27/2021   12:30 PM  Readmission Risk Prevention Plan  Transportation Screening Complete Complete  PCP or Specialist Appt within 3-5 Days  Complete  HRI or Home Care Consult  Complete  Social Work Consult for Recovery Care Planning/Counseling  Complete  Palliative Care Screening  Not Applicable  Medication Review Oceanographer) Complete Complete  HRI or Home Care Consult Complete   SW Recovery Care/Counseling Consult Complete   Palliative Care  Screening Not Applicable   Skilled Nursing Facility Not Applicable

## 2023-10-21 NOTE — ED Notes (Signed)
Pt up to chair with PT.

## 2023-10-21 NOTE — ED Notes (Signed)
Secretary to call for transport to [employer] [employer]

## 2023-10-21 NOTE — ED Notes (Signed)
Pt encouraged to provide urine per order. This RN to assist pt in collecting urine, pt reports unable to urinate at this time. Will try again later.

## 2023-10-21 NOTE — ED Notes (Signed)
[facility] transport to ED for transport.

## 2023-10-21 NOTE — ED Notes (Signed)
Visitor at bedside.

## 2023-10-30 ENCOUNTER — Other Ambulatory Visit: Payer: Self-pay | Admitting: Cardiology

## 2023-10-30 DIAGNOSIS — E785 Hyperlipidemia, unspecified: Secondary | ICD-10-CM

## 2023-10-31 ENCOUNTER — Emergency Department (HOSPITAL_COMMUNITY)
Admission: EM | Admit: 2023-10-31 | Discharge: 2023-10-31 | Disposition: A | Discharge status: Home / Self Care | Payer: Medicare HMO | Attending: Emergency Medicine | Admitting: Emergency Medicine

## 2023-10-31 ENCOUNTER — Emergency Department (HOSPITAL_COMMUNITY): Payer: Medicare HMO

## 2023-10-31 ENCOUNTER — Other Ambulatory Visit: Payer: Self-pay

## 2023-10-31 DIAGNOSIS — Z7982 Long term (current) use of aspirin: Secondary | ICD-10-CM | POA: Insufficient documentation

## 2023-10-31 DIAGNOSIS — M545 Low back pain, unspecified: Secondary | ICD-10-CM | POA: Diagnosis not present

## 2023-10-31 DIAGNOSIS — W06XXXA Fall from bed, initial encounter: Secondary | ICD-10-CM | POA: Diagnosis not present

## 2023-10-31 DIAGNOSIS — Z7901 Long term (current) use of anticoagulants: Secondary | ICD-10-CM | POA: Diagnosis not present

## 2023-10-31 DIAGNOSIS — S0093XA Contusion of unspecified part of head, initial encounter: Secondary | ICD-10-CM | POA: Diagnosis not present

## 2023-10-31 DIAGNOSIS — W19XXXA Unspecified fall, initial encounter: Secondary | ICD-10-CM

## 2023-10-31 DIAGNOSIS — M542 Cervicalgia: Secondary | ICD-10-CM | POA: Insufficient documentation

## 2023-10-31 DIAGNOSIS — S0990XA Unspecified injury of head, initial encounter: Secondary | ICD-10-CM | POA: Diagnosis present

## 2023-10-31 DIAGNOSIS — N390 Urinary tract infection, site not specified: Secondary | ICD-10-CM

## 2023-10-31 DIAGNOSIS — S32010A Wedge compression fracture of first lumbar vertebra, initial encounter for closed fracture: Secondary | ICD-10-CM

## 2023-10-31 NOTE — ED Provider Notes (Signed)
Signout from Dr. Oletta Cohn.  77 year old male history of dementia here after fall out of bed.  C-spine showing some possible old fractures.  Lumbar spine showing L1 fracture.  He is pending CT lumbar spine.  Does not have any neurologic deficits.  Plan will be to follow-up on CT of lumbar spine and patient likely can be returned back to his facility Physical Exam  BP (!) 131/101   Pulse (!) 59   Temp 98.1 F (36.7 C) (Oral)   Resp 15   Ht 5\' 7"  (1.702 m)   Wt 68 kg   SpO2 97%   BMI 23.48 kg/m   Physical Exam  Procedures  Procedures  ED Course / MDM    Medical Decision Making Amount and/or Complexity of Data Reviewed Radiology: ordered.   Patient CT lumbar shows compression fracture.  I reviewed this with PA Doran Durand in consultation with Dr. Dutch Quint.  They said if he does not show any signs of neurologic deficits they would not recommend a brace.  They also reviewed the C-spine he does not feel that that he needs to be in a cervical collar.  He can follow-up outpatient if any problems.  I called the patient's son and updated him.  Awaiting ambulance transport back to his facility.       Terrilee Files, MD 10/31/23 [phone]

## 2023-10-31 NOTE — ED Triage Notes (Signed)
Pt BIB EMS from [facility]. Pt had unwitnessed fall from the bed to the floor. Pt is on Plavix; c/o back pain.

## 2023-10-31 NOTE — ED Notes (Signed)
RN attempted to call facility again. No answer at this time.

## 2023-10-31 NOTE — Discharge Instructions (Addendum)
You were seen in the emergency department for evaluation of injuries from a fall.  You had a CAT scan of your head and neck along with x-rays and a CAT scan of your lumbar spine.  The main new finding is a compression fracture of your lumbar vertebra #1.  This is usually treated symptomatically with pain control.  Neurosurgery said you can follow-up with them if you are having any problems.

## 2023-10-31 NOTE — ED Notes (Signed)
RN called [facility].  

## 2023-10-31 NOTE — ED Provider Notes (Signed)
[place] EMERGENCY DEPARTMENT AT [facility] Provider Note   CSN: [id number] Arrival date & time: 10/31/23  8295     History  Chief Complaint  Patient presents with   Robert Osborne    DIMETRIUS LAZZARA is a 77 y.o. male.  Presents to the emergency department as a level 2 trauma.  Comes to the ED by ambulance from skilled nursing facility.  Patient reports that he accidentally rolled out of his bed.  He takes Plavix daily.  Patient complaining of pain in the back of his head and upper neck, as well as lower back.       Home Medications Prior to Admission medications   Medication Sig Start Date End Date Taking? Authorizing Provider  acetaminophen (TYLENOL) 500 MG tablet Take 500 mg by mouth every 6 (six) hours as needed for mild pain (pain score 1-3).    [provider]  aspirin EC 81 MG EC tablet Take 1 tablet (81 mg total) by mouth daily. Swallow whole. 08/30/21   Barrett, Erin R, PA-C  blood glucose meter kit and supplies KIT Dispense based on patient and insurance preference. Use up to twice a daily as directed. (FOR ICD-10 - type 2 DM uncontrolled E11.9) 08/19/19   Johnson, Clanford L, MD  blood glucose meter kit and supplies Dispense based on patient and insurance preference. Use up to four times daily as directed. (FOR ICD-10 E10.9, E11.9). 08/23/19   Jannifer Rodney A, FNP  blood glucose meter kit and supplies Use up to four times daily as directed. dx E11.9 08/23/19   Remus Loffler, PA-C  Blood Glucose Monitoring Suppl (ONE TOUCH ULTRA 2) w/Device KIT USE TO CHECK BLOOD SUGAR UP TO 4 TIMES DAILY AS DIRECTED 05/15/20   Jannifer Rodney A, FNP  clopidogrel (PLAVIX) 75 MG tablet Take 1 tablet (75 mg total) by mouth daily. 08/30/21   Barrett, Erin R, PA-C  dapagliflozin propanediol (FARXIGA) 10 MG TABS tablet Take 1 tablet (10 mg total) by mouth daily. 08/30/21   Barrett, Erin R, PA-C  ezetimibe (ZETIA) 10 MG tablet Take 1 tablet (10 mg total) by mouth daily. Patient not taking:  Reported on 10/18/2023 10/07/23   Rollene Rotunda, MD  gabapentin (NEURONTIN) 100 MG capsule Take 100 mg by mouth 2 (two) times daily. 06/25/23   [provider]  glimepiride (AMARYL) 2 MG tablet Take 2 mg by mouth daily. 07/22/22   [provider]  glucose blood test strip Use as instructed 05/15/20   Jannifer Rodney A, FNP  HYDROcodone-acetaminophen (NORCO/VICODIN) 5-325 MG tablet One tablet every six hours for pain.  Limit 7 days. 07/31/22   Darreld Mclean, MD  ibuprofen (ADVIL) 200 MG tablet Take 200 mg by mouth every 6 (six) hours as needed for mild pain (pain score 1-3).    [provider]  insulin degludec (TRESIBA FLEXTOUCH) 100 UNIT/ML FlexTouch Pen Inject 5 Units into the skin daily. 03/22/22   Azucena Fallen, MD  Insulin Pen Needle (PEN NEEDLES) 32G X 4 MM MISC 100 each by Does not apply route 4 (four) times daily. E10.9 05/16/20   Junie Spencer, FNP  Lancets (ONETOUCH DELICA PLUS LANCET33G) MISC USE TO CHECK BLOOD SUGAR UP TO 4 TIMES A DAY AS DIRECTED 01/23/20   Jannifer Rodney A, FNP  metoprolol tartrate (LOPRESSOR) 50 MG tablet TAKE 1 TABLET BY MOUTH TWICE A DAY 09/22/23   Rollene Rotunda, MD  Multiple Vitamin (MULTIVITAMIN WITH MINERALS) TABS tablet Take 1 tablet by mouth  daily.    [provider]  naloxone [facility]) nasal spray 4 mg/0.1 mL Place 1 spray into the nose once. 04/29/23   [provider]  nitroGLYCERIN (NITROSTAT) 0.4 MG SL tablet Place 0.4 mg under the tongue every 5 (five) minutes as needed for chest pain.    [provider]  Nutritional Supplements (,FEEDING SUPPLEMENT, PROSOURCE PLUS) liquid Take 30 mLs by mouth 2 (two) times daily between meals. 03/22/22   Azucena Fallen, MD  OLANZapine (ZYPREXA) 5 MG tablet Take 5 mg by mouth at bedtime.    [provider]  ondansetron (ZOFRAN) 4 MG tablet Take 4 mg by mouth every 8 (eight) hours as needed for nausea or vomiting. 08/05/23   [provider]   rosuvastatin (CRESTOR) 40 MG tablet Take 1 tablet (40 mg total) by mouth daily. 08/30/21   Barrett, Erin R, PA-C  tamsulosin (FLOMAX) 0.4 MG CAPS capsule Take 0.4 mg by mouth daily.    [provider]      Allergies    Enalapril    Review of Systems   Review of Systems  Physical Exam Updated Vital Signs BP (!) 131/101   Pulse (!) 59   Temp 98.1 F (36.7 C) (Oral)   Resp 15   Ht 5\' 7"  (1.702 m)   Wt 68 kg   SpO2 97%   BMI 23.48 kg/m  Physical Exam Vitals and nursing note reviewed.  Constitutional:      General: He is not in acute distress.    Appearance: He is well-developed.  HENT:     Head: Normocephalic. Contusion present.      Mouth/Throat:     Mouth: Mucous membranes are moist.  Eyes:     General: Vision grossly intact. Gaze aligned appropriately.     Extraocular Movements: Extraocular movements intact.     Conjunctiva/sclera: Conjunctivae normal.  Cardiovascular:     Rate and Rhythm: Normal rate and regular rhythm.     Pulses: Normal pulses.     Heart sounds: Normal heart sounds, S1 normal and S2 normal. No murmur heard.    No friction rub. No gallop.  Pulmonary:     Effort: Pulmonary effort is normal. No respiratory distress.     Breath sounds: Normal breath sounds.  Abdominal:     Palpations: Abdomen is soft.     Tenderness: There is no abdominal tenderness. There is no guarding or rebound.     Hernia: No hernia is present.  Musculoskeletal:        General: No swelling.     Cervical back: Full passive range of motion without pain, normal range of motion and neck supple. No pain with movement, spinous process tenderness or muscular tenderness. Normal range of motion.     Lumbar back: Tenderness present.     Right hip: Normal.     Left hip: Normal.     Right lower leg: No edema.     Left lower leg: No edema.  Skin:    General: Skin is warm and dry.     Capillary Refill: Capillary refill takes less than 2 seconds.     Findings: No ecchymosis,  erythema, lesion or wound.  Neurological:     Mental Status: He is alert and oriented to person, place, and time.     GCS: GCS eye subscore is 4. GCS verbal subscore is 5. GCS motor subscore is 6.     Cranial Nerves: Cranial nerves 2-12 are intact.  Sensory: Sensation is intact.     Motor: Motor function is intact. No weakness or abnormal muscle tone.     Coordination: Coordination is intact.  Psychiatric:        Mood and Affect: Mood normal.        Speech: Speech normal.        Behavior: Behavior normal.     ED Results / Procedures / Treatments   Labs (all labs ordered are listed, but only abnormal results are displayed) Labs Reviewed - No data to display  EKG None  Radiology CT HEAD WO CONTRAST ( )  Result Date: 10/31/2023 CLINICAL DATA:  Unwitnessed fall from the bed to the floor, minor head trauma and neck trauma. Patient is on Plavix. EXAM: CT HEAD WITHOUT CONTRAST CT CERVICAL SPINE WITHOUT CONTRAST TECHNIQUE: Multidetector CT imaging of the head and cervical spine was performed following the standard protocol without intravenous contrast. Multiplanar CT image reconstructions of the cervical spine were also generated. RADIATION DOSE REDUCTION: This exam was performed according to the departmental dose-optimization program which includes automated exposure control, adjustment of the mA and/or kV according to patient size and/or use of iterative reconstruction technique. COMPARISON:  MRI cervical spine 08/19/2019. No prior cervical spine CT. CT scan head 08/17/2019. FINDINGS: CT HEAD FINDINGS Brain: There is moderately advanced global atrophy with atrophic ventriculomegaly and moderate to severe small vessel disease of the cerebral white matter. Small scattered chronic bilateral gangliocapsular lacunar infarcts are again noted. No midline shift is seen and no asymmetry concerning for an acute cortical infarct, hemorrhage, mass or mass effect. Basal cisterns are clear. Vascular:  Moderate to heavy calcific plaque both siphons with moderate calcification in the distal vertebral arteries. No hyperdense central vessel is seen. Skull: Negative for fractures or focal lesions. No visible scalp hematoma. Sinuses/Orbits: No acute findings. Other: Left-sided deviation and spurring of the nasal septum. CT CERVICAL SPINE FINDINGS Alignment: There is a chronic minimal discogenic retrolisthesis at C3-4 and C4-5, and also chronic 2-3 mm anterolisthesis of C5 on 6, the latter most likely related to facet hypertrophy. There are no widening of the anterior atlantodental joint or further listhesis. There is a slight cervical dextroscoliosis as well. Skull base and vertebrae: Bone mineralization is osteopenic. There is a chronic transverse type 2 odontoid process fracture with nonunion and 3 mm distraction. This is unchanged. There is a short linear chip fracture of undetermined age, possibly chronic also, from the anterior inferior surface of the left C1 lateral mass, best seen on coronal reformatted images 37-39, unable to be confirmed on the prior MRI. The vertebrae are normal in heights with no further evidence of fractures. There is no appreciable interval change in the C1-2 alignment, with the left C1-2 lateral mass again slightly posteriorly offset which was seen previously. Soft tissues and spinal canal: No prevertebral fluid or swelling. No visible canal hematoma. There is moderate calcification of both carotid bifurcations. The proximal left cervical ICA is retropharyngeal at the level of C2 and C3. Disc levels: Advanced degenerative disc collapse again noted C3-4 and C4-5, mild disc space loss C2-3 and C7-T1. Other discs are normal in heights. There are bidirectional osteophytes at C2-3 and C3-4 with posterior disc osteophyte complexes deforming the ventral cord surface at both levels, with no other levels demonstrating significant soft tissue or bony encroachment on the sac. Left-greater-than-right  facet joint and uncinate hypertrophy noted multiple levels. Foraminal stenosis is severe on the left and mild on the right at C3-4, bilaterally moderate C4-5,  moderate on the left C5-6. Upper chest: Lung bases show emphysematous changes but no acute findings. Other: None. IMPRESSION: 1. No acute intracranial CT findings or depressed skull fractures. 2. Atrophy and small-vessel disease. 3. Osteopenia and degenerative change of the cervical spine. 4. 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 5. Short linear chip fracture of undetermined age, possibly also chronic, from the anterior inferior surface of the left C1 lateral mass. 6. Chronic minimal discogenic retrolisthesis C3-4 and C4-5, and chronic 2-3 mm anterolisthesis C5 on 6. 7. Carotid atherosclerosis. 8. Emphysema. 9. These results will be called to the ordering clinician or representative by the Radiologist Assistant, and communication documented in the PACS or [employer]. Emphysema (ICD10-J43.9). Electronically Signed   By: Almira Bar M.D.   On: 10/31/2023 06:23   CT CERVICAL SPINE WO CONTRAST  Result Date: 10/31/2023 CLINICAL DATA:  Unwitnessed fall from the bed to the floor, minor head trauma and neck trauma. Patient is on Plavix. EXAM: CT HEAD WITHOUT CONTRAST CT CERVICAL SPINE WITHOUT CONTRAST TECHNIQUE: Multidetector CT imaging of the head and cervical spine was performed following the standard protocol without intravenous contrast. Multiplanar CT image reconstructions of the cervical spine were also generated. RADIATION DOSE REDUCTION: This exam was performed according to the departmental dose-optimization program which includes automated exposure control, adjustment of the mA and/or kV according to patient size and/or use of iterative reconstruction technique. COMPARISON:  MRI cervical spine 08/19/2019. No prior cervical spine CT. CT scan head 08/17/2019. FINDINGS: CT HEAD FINDINGS Brain: There  is moderately advanced global atrophy with atrophic ventriculomegaly and moderate to severe small vessel disease of the cerebral white matter. Small scattered chronic bilateral gangliocapsular lacunar infarcts are again noted. No midline shift is seen and no asymmetry concerning for an acute cortical infarct, hemorrhage, mass or mass effect. Basal cisterns are clear. Vascular: Moderate to heavy calcific plaque both siphons with moderate calcification in the distal vertebral arteries. No hyperdense central vessel is seen. Skull: Negative for fractures or focal lesions. No visible scalp hematoma. Sinuses/Orbits: No acute findings. Other: Left-sided deviation and spurring of the nasal septum. CT CERVICAL SPINE FINDINGS Alignment: There is a chronic minimal discogenic retrolisthesis at C3-4 and C4-5, and also chronic 2-3 mm anterolisthesis of C5 on 6, the latter most likely related to facet hypertrophy. There are no widening of the anterior atlantodental joint or further listhesis. There is a slight cervical dextroscoliosis as well. Skull base and vertebrae: Bone mineralization is osteopenic. There is a chronic transverse type 2 odontoid process fracture with nonunion and 3 mm distraction. This is unchanged. There is a short linear chip fracture of undetermined age, possibly chronic also, from the anterior inferior surface of the left C1 lateral mass, best seen on coronal reformatted images 37-39, unable to be confirmed on the prior MRI. The vertebrae are normal in heights with no further evidence of fractures. There is no appreciable interval change in the C1-2 alignment, with the left C1-2 lateral mass again slightly posteriorly offset which was seen previously. Soft tissues and spinal canal: No prevertebral fluid or swelling. No visible canal hematoma. There is moderate calcification of both carotid bifurcations. The proximal left cervical ICA is retropharyngeal at the level of C2 and C3. Disc levels: Advanced  degenerative disc collapse again noted C3-4 and C4-5, mild disc space loss C2-3 and C7-T1. Other discs are normal in heights. There are bidirectional osteophytes at C2-3 and C3-4 with posterior disc osteophyte complexes deforming the  ventral cord surface at both levels, with no other levels demonstrating significant soft tissue or bony encroachment on the sac. Left-greater-than-right facet joint and uncinate hypertrophy noted multiple levels. Foraminal stenosis is severe on the left and mild on the right at C3-4, bilaterally moderate C4-5, moderate on the left C5-6. Upper chest: Lung bases show emphysematous changes but no acute findings. Other: None. IMPRESSION: 1. No acute intracranial CT findings or depressed skull fractures. 2. Atrophy and small-vessel disease. 3. Osteopenia and degenerative change of the cervical spine. 4. 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 5. Short linear chip fracture of undetermined age, possibly also chronic, from the anterior inferior surface of the left C1 lateral mass. 6. Chronic minimal discogenic retrolisthesis C3-4 and C4-5, and chronic 2-3 mm anterolisthesis C5 on 6. 7. Carotid atherosclerosis. 8. Emphysema. 9. These results will be called to the ordering clinician or representative by the Radiologist Assistant, and communication documented in the PACS or [employer]. Emphysema (ICD10-J43.9). Electronically Signed   By: Almira Bar M.D.   On: 10/31/2023 06:23   DG Lumbar Spine 2-3 Views  Result Date: 10/31/2023 CLINICAL DATA:  77 year old male with history of trauma from a fall complaining of back pain. EXAM: LUMBAR SPINE - 2-3 VIEW COMPARISON:  No priors. FINDINGS: Multiple vertebral body compression fractures are noted involving at least L1 and L3. Notably, there is mild irregularity of the anterior aspect of the superior endplate of L1 vertebral body, which could suggest an acute or subacute type compression  fracture (there is up to 40% loss of central vertebral body height at this level). Approximately 25% loss of central vertebral body height is also noted at L3, chronic in appearance. Alignment is anatomic. Multilevel degenerative disc disease and facet arthropathy, most severe L5-S1. Numerous vascular calcifications are noted. IMPRESSION: 1. Multilevel degenerative disc disease and lumbar spondylosis, as above, with compression fractures of L1 and L3. Although these may be chronic, there is irregularity of the anterior aspect of the superior endplate of L1, which could suggest an acute or subacute type injury in this region. Correlation with point tenderness is recommended. This could be further evaluated with CT of the lumbar spine if clinically appropriate. 2. Aortic atherosclerosis. Electronically Signed   By: Trudie Reed M.D.   On: 10/31/2023 06:08   DG Pelvis 1-2 Views  Result Date: 10/31/2023 CLINICAL DATA:  77 year old male with history of trauma from a fall complaining of back pain. EXAM: PELVIS - 1-2 VIEW COMPARISON:  No priors. FINDINGS: There is no evidence of pelvic fracture or diastasis. No pelvic bone lesions are seen. Moderate degenerative changes of osteoarthritis are noted in both hip joints. IMPRESSION: Negative. Electronically Signed   By: Trudie Reed M.D.   On: 10/31/2023 06:06    Procedures Procedures    Medications Ordered in ED Medications - No data to display  ED Course/ Medical Decision Making/ A&P                                 Medical Decision Making Amount and/or Complexity of Data Reviewed Radiology: ordered.   Presents after a fall.  Patient rolled out of his bed, hitting the floor.  Complaining of head, neck pain, low back pain.  Patient has baseline dementia, is awake and alert.  Patient with normal strength and sensation in all extremities.  CT head, cervical spine performed.  Patient with multiple chronic  findings in the C-spine.  X-ray of the  lumbar spine with possible acute L1 fracture.  Will perform CT lumbar spine, sign out to oncoming ER physician.        Final Clinical Impression(s) / ED Diagnoses Final diagnoses:  Fall, initial encounter    Rx / DC Orders ED Discharge Orders     None         Kinsey Cowsert, Canary Brim, MD 10/31/23 [phone]

## 2023-10-31 NOTE — ED Notes (Signed)
RN took pt to CT.

## 2023-10-31 NOTE — ED Notes (Signed)
[facility] at bedside for transport.  

## 2023-10-31 NOTE — Progress Notes (Signed)
Level 2 trauma for fall on thinners with head strike. Chaplain offers compassionate presence and prayer. Spouse waiting to come to ED; this is patient's third trip to ED this month.

## 2023-10-31 NOTE — ED Notes (Signed)
RN attempted to call [facility]- no answer

## 2023-10-31 NOTE — ED Notes (Signed)
RN placed pt in [place] J collar

## 2023-11-03 ENCOUNTER — Ambulatory Visit: Payer: Medicare HMO | Admitting: Cardiology

## 2023-11-03 ENCOUNTER — Other Ambulatory Visit: Payer: Self-pay | Admitting: Cardiology

## 2023-11-03 DIAGNOSIS — E785 Hyperlipidemia, unspecified: Secondary | ICD-10-CM

## 2023-11-20 ENCOUNTER — Other Ambulatory Visit: Payer: Self-pay | Admitting: Cardiology

## 2023-11-20 DIAGNOSIS — E785 Hyperlipidemia, unspecified: Secondary | ICD-10-CM

## 2023-12-16 DEATH — deceased

## 2024-08-05 ENCOUNTER — Encounter: Payer: Self-pay | Admitting: Radiology

## 2024-10-17 ENCOUNTER — Encounter: Payer: Self-pay | Admitting: Radiology
